# Patient Record
Sex: Female | Born: 1990
Health system: Southern US, Community
[De-identification: ages and names within clinical notes are randomized; demographics above are authoritative.]

## PROBLEM LIST (undated history)

## (undated) ENCOUNTER — Inpatient Hospital Stay (HOSPITAL_COMMUNITY): Payer: Self-pay

## (undated) DIAGNOSIS — D8989 Other specified disorders involving the immune mechanism, not elsewhere classified: Secondary | ICD-10-CM

## (undated) DIAGNOSIS — L509 Urticaria, unspecified: Secondary | ICD-10-CM

## (undated) DIAGNOSIS — T7840XA Allergy, unspecified, initial encounter: Secondary | ICD-10-CM

## (undated) DIAGNOSIS — R87629 Unspecified abnormal cytological findings in specimens from vagina: Secondary | ICD-10-CM

## (undated) DIAGNOSIS — R000 Tachycardia, unspecified: Secondary | ICD-10-CM

## (undated) DIAGNOSIS — F419 Anxiety disorder, unspecified: Secondary | ICD-10-CM

## (undated) DIAGNOSIS — K219 Gastro-esophageal reflux disease without esophagitis: Secondary | ICD-10-CM

## (undated) DIAGNOSIS — G43909 Migraine, unspecified, not intractable, without status migrainosus: Secondary | ICD-10-CM

## (undated) DIAGNOSIS — Z8489 Family history of other specified conditions: Secondary | ICD-10-CM

## (undated) DIAGNOSIS — T8859XA Other complications of anesthesia, initial encounter: Secondary | ICD-10-CM

## (undated) DIAGNOSIS — Z975 Presence of (intrauterine) contraceptive device: Secondary | ICD-10-CM

## (undated) DIAGNOSIS — T4145XA Adverse effect of unspecified anesthetic, initial encounter: Secondary | ICD-10-CM

## (undated) DIAGNOSIS — J45909 Unspecified asthma, uncomplicated: Secondary | ICD-10-CM

## (undated) DIAGNOSIS — E282 Polycystic ovarian syndrome: Secondary | ICD-10-CM

## (undated) DIAGNOSIS — O139 Gestational [pregnancy-induced] hypertension without significant proteinuria, unspecified trimester: Secondary | ICD-10-CM

## (undated) HISTORY — DX: Presence of (intrauterine) contraceptive device: Z97.5

## (undated) HISTORY — DX: Unspecified abnormal cytological findings in specimens from vagina: R87.629

## (undated) HISTORY — PX: ESOPHAGOGASTRODUODENOSCOPY: SHX1529

## (undated) HISTORY — DX: Other specified disorders involving the immune mechanism, not elsewhere classified: D89.89

## (undated) HISTORY — DX: Migraine, unspecified, not intractable, without status migrainosus: G43.909

## (undated) HISTORY — DX: Allergy, unspecified, initial encounter: T78.40XA

## (undated) HISTORY — DX: Urticaria, unspecified: L50.9

---
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
## 2008-01-20 ENCOUNTER — Inpatient Hospital Stay (HOSPITAL_COMMUNITY): Admission: AD | Admit: 2008-01-20 | Discharge: 2008-01-20 | Payer: Self-pay | Admitting: Obstetrics & Gynecology

## 2008-01-20 IMAGING — US US ART/VEN ABD/PELV/SCROTUM DOPPLER COMPLETE
1 series · 14 of 18 positions shown · non-contrast
Comparison: [DATE]

CLINICAL DATA: Right lower quadrant pain.  Evaluate ovaries for
torsion.

LIMITED TRANSABDOMINAL ULTRASOUND OF PELVIS WITH
DOPPLER ULTRASOUND OF OVARIES
TECHNIQUE: Transabdominal ultrasound examination of the pelvis was
performed including evaluation of the uterus, ovaries, adnexal
regions, and pelvic cul-de-sac. Color and duplex Doppler ultrasound
was utilized to evaluate blood flow to the ovaries.

[Series 1: us pelvis limited · 14 of 18 slices shown]
[im 1/18]
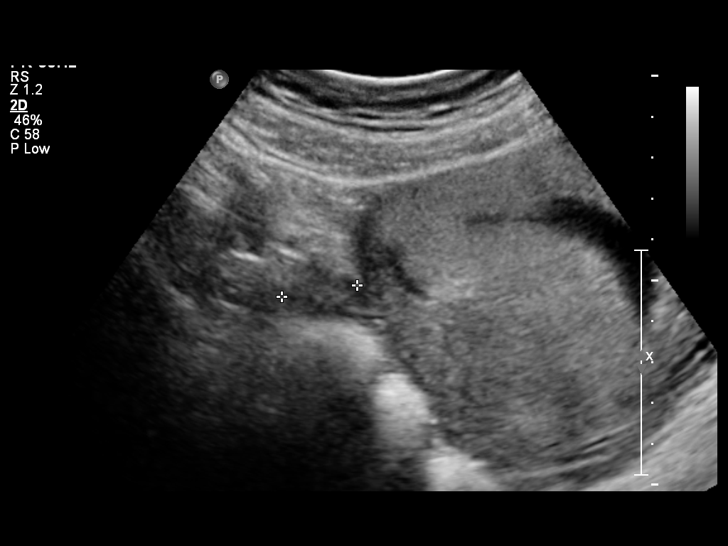
[im 2/18]
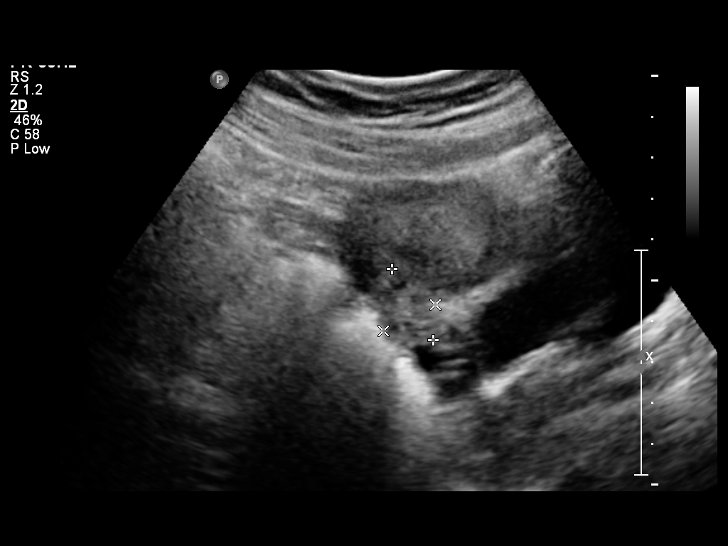
[im 4/18]
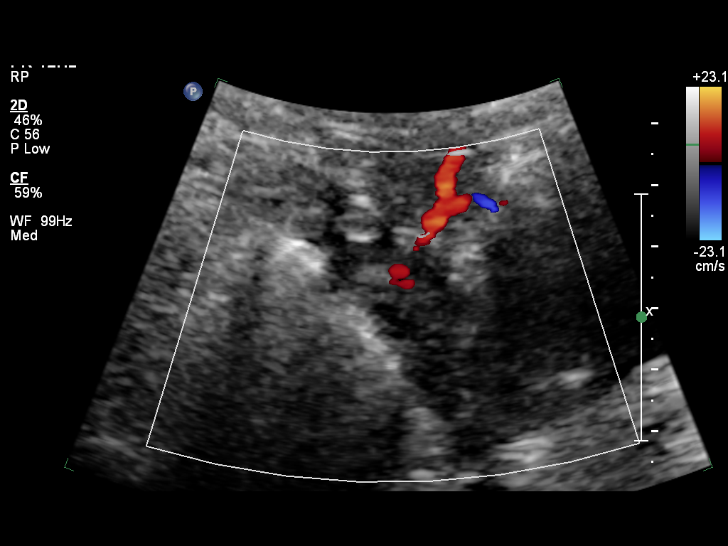
[im 5/18]
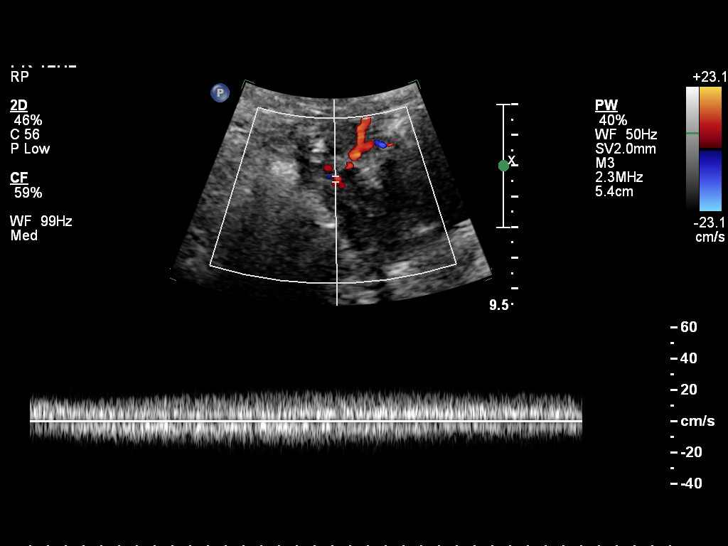
[im 6/18]
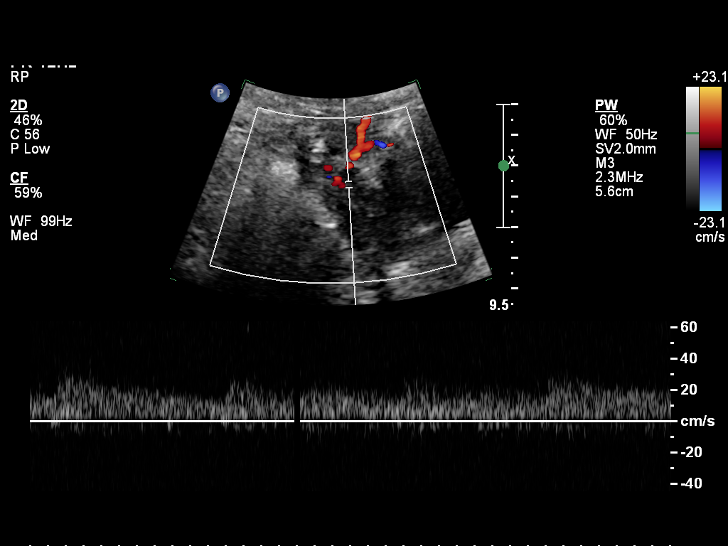
[im 8/18]
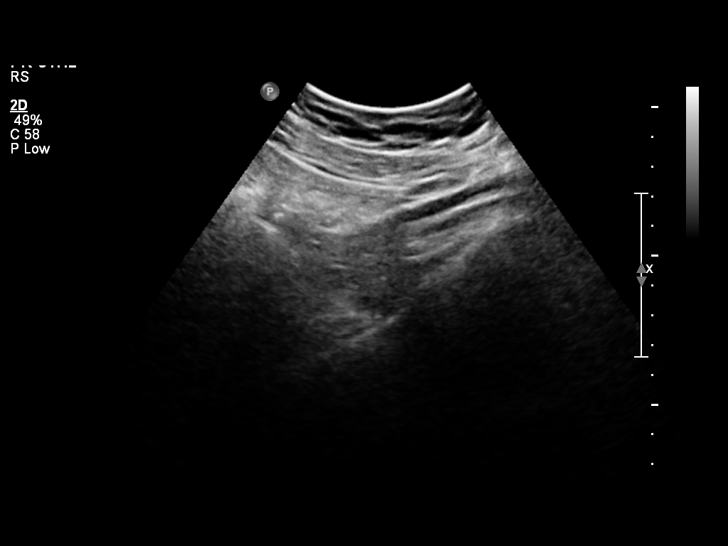
[im 9/18]
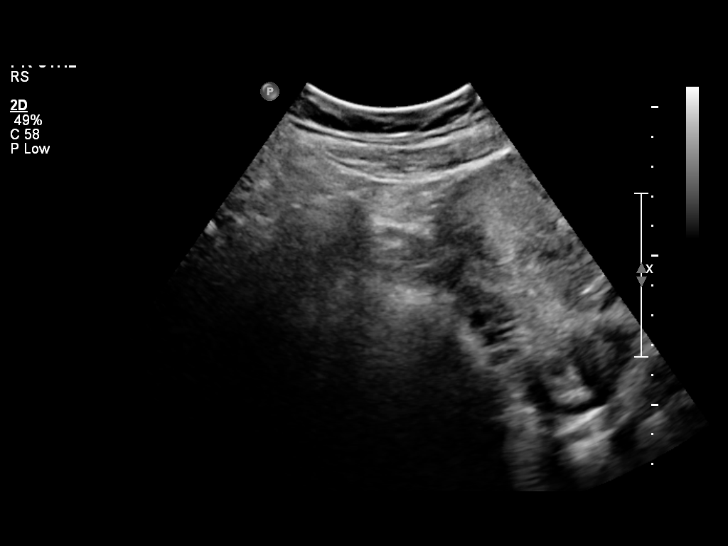
[im 10/18]
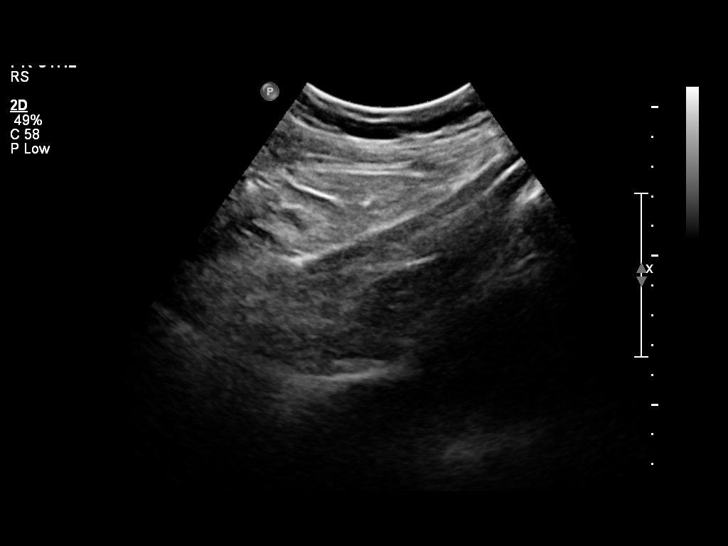
[im 11/18]
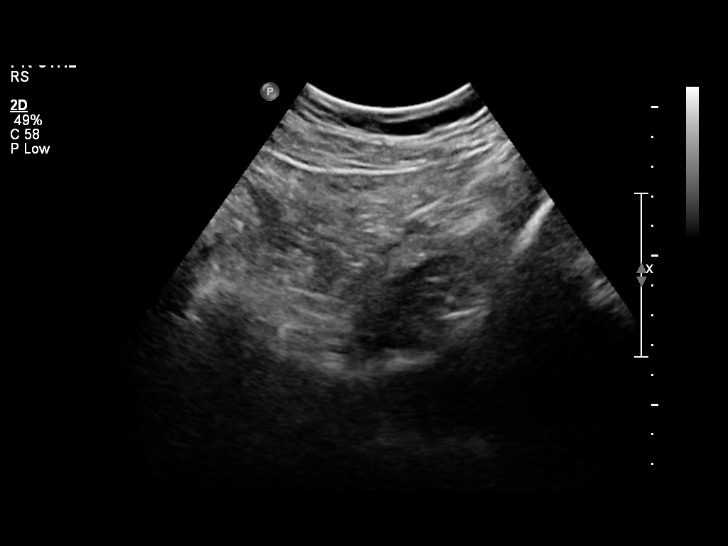
[im 13/18]
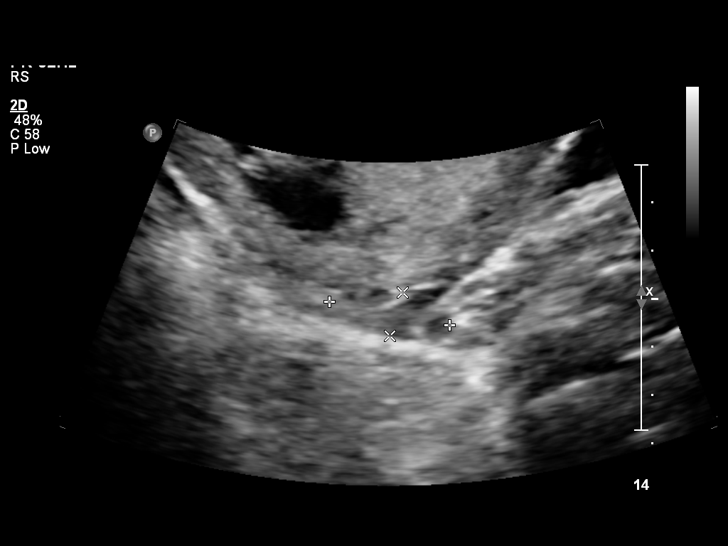
[im 14/18]
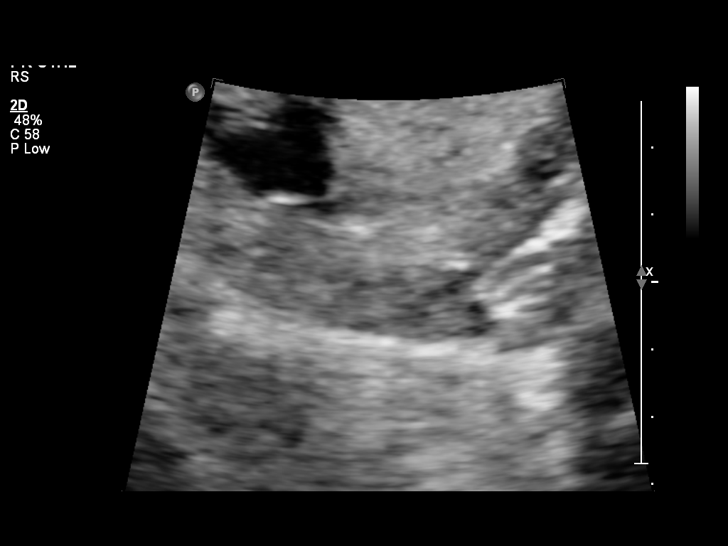
[im 15/18]
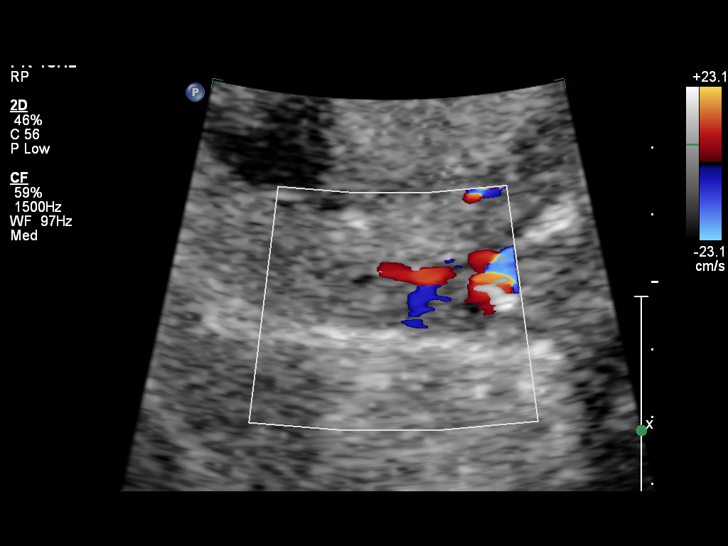
[im 17/18]
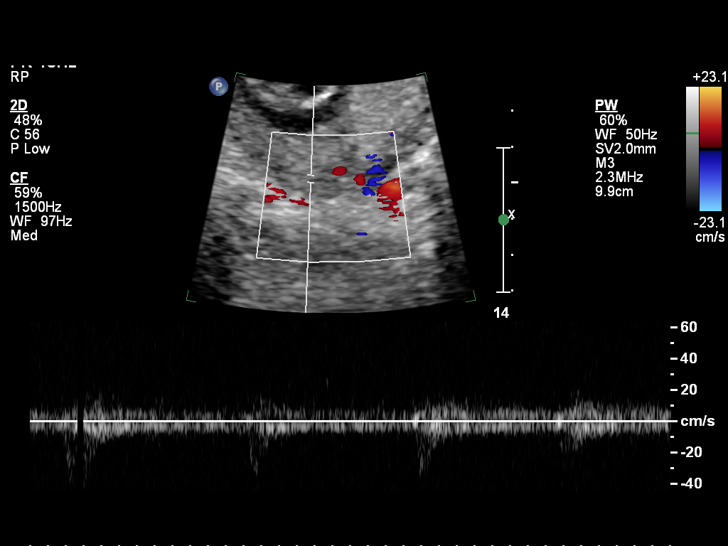
[im 18/18]
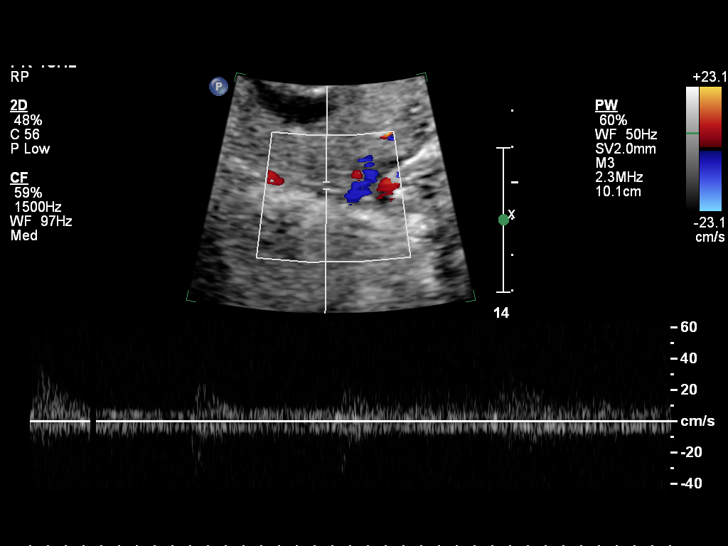

[14 of 18 positions shown; findings below may reference images not displayed]

FINDINGS: Evaluation of the gravid uterus was not requested.

The right ovary is 2.0 x 1.4 x 1.9 cm.  The left ovary is 2.5 x
x 2.1 cm.  Arterial and venous flow is identified within both
ovaries, excluding complete torsion at this time.  Ovaries have
normal appearance.  No free pelvic fluid is identified.
IMPRESSION: No evidence for ovarian torsion at this time.  Ovaries have normal
morphology bilaterally.

## 2008-01-24 ENCOUNTER — Other Ambulatory Visit: Admission: RE | Admit: 2008-01-24 | Discharge: 2008-01-24 | Payer: Self-pay | Admitting: Obstetrics & Gynecology

## 2008-03-01 ENCOUNTER — Inpatient Hospital Stay (HOSPITAL_COMMUNITY): Admission: AD | Admit: 2008-03-01 | Discharge: 2008-03-01 | Payer: Self-pay | Admitting: Obstetrics and Gynecology

## 2008-03-01 ENCOUNTER — Ambulatory Visit: Payer: Self-pay | Admitting: Advanced Practice Midwife

## 2008-04-25 HISTORY — PX: CHOLECYSTECTOMY: SHX55

## 2008-06-30 ENCOUNTER — Ambulatory Visit: Payer: Self-pay | Admitting: Obstetrics and Gynecology

## 2008-06-30 ENCOUNTER — Inpatient Hospital Stay (HOSPITAL_COMMUNITY): Admission: AD | Admit: 2008-06-30 | Discharge: 2008-06-30 | Payer: Self-pay | Admitting: Obstetrics & Gynecology

## 2008-07-19 ENCOUNTER — Inpatient Hospital Stay (HOSPITAL_COMMUNITY): Admission: AD | Admit: 2008-07-19 | Discharge: 2008-07-20 | Payer: Self-pay | Admitting: Family Medicine

## 2008-07-23 ENCOUNTER — Ambulatory Visit: Payer: Self-pay | Admitting: Physician Assistant

## 2008-07-23 ENCOUNTER — Inpatient Hospital Stay (HOSPITAL_COMMUNITY): Admission: AD | Admit: 2008-07-23 | Discharge: 2008-07-26 | Payer: Self-pay | Admitting: Obstetrics & Gynecology

## 2008-07-24 ENCOUNTER — Encounter: Payer: Self-pay | Admitting: Obstetrics & Gynecology

## 2008-08-14 ENCOUNTER — Emergency Department (HOSPITAL_COMMUNITY): Admission: EM | Admit: 2008-08-14 | Discharge: 2008-08-14 | Payer: Self-pay | Admitting: Emergency Medicine

## 2008-08-17 IMAGING — CR DG CHEST 2V
2 series · 2 of 2 positions shown · non-contrast
Comparison: [DATE]

CLINICAL DATA: Chest pain.  Left arm pain.

CHEST - 2 VIEW

[view not recorded (1 of 2)]
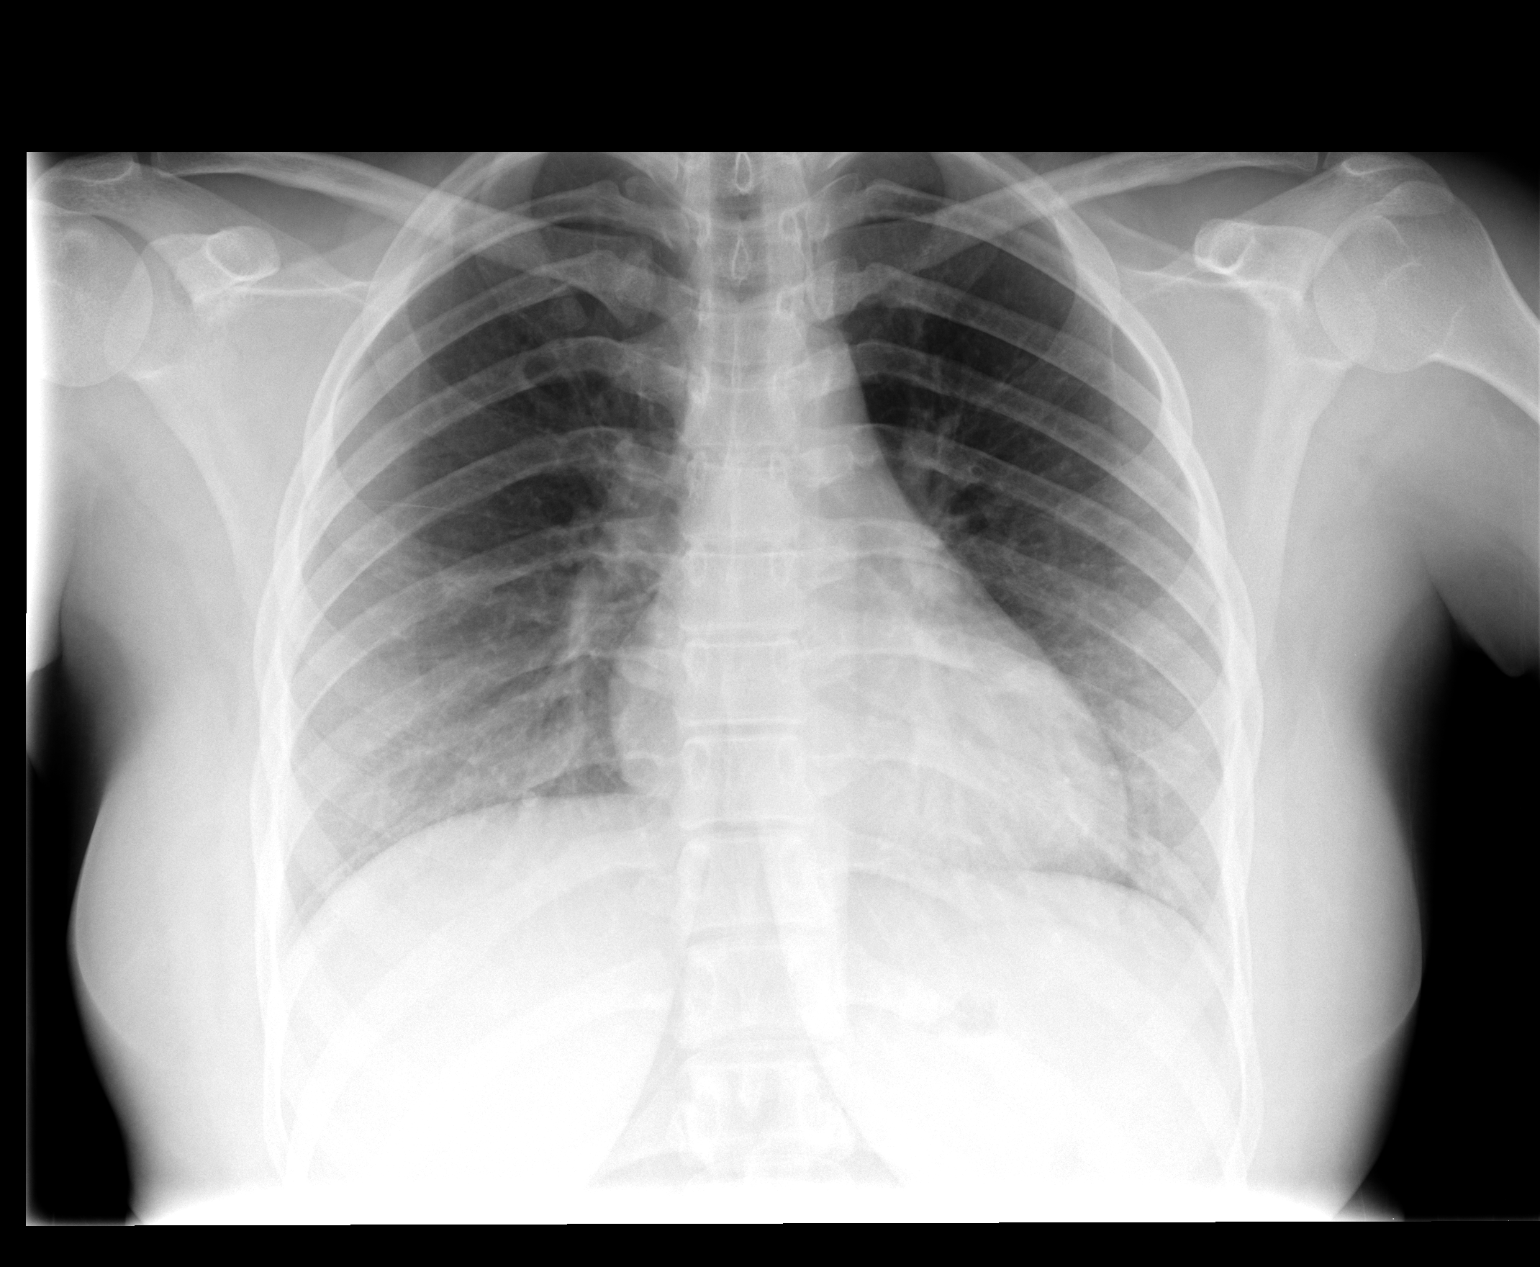

[view not recorded (2 of 2)]
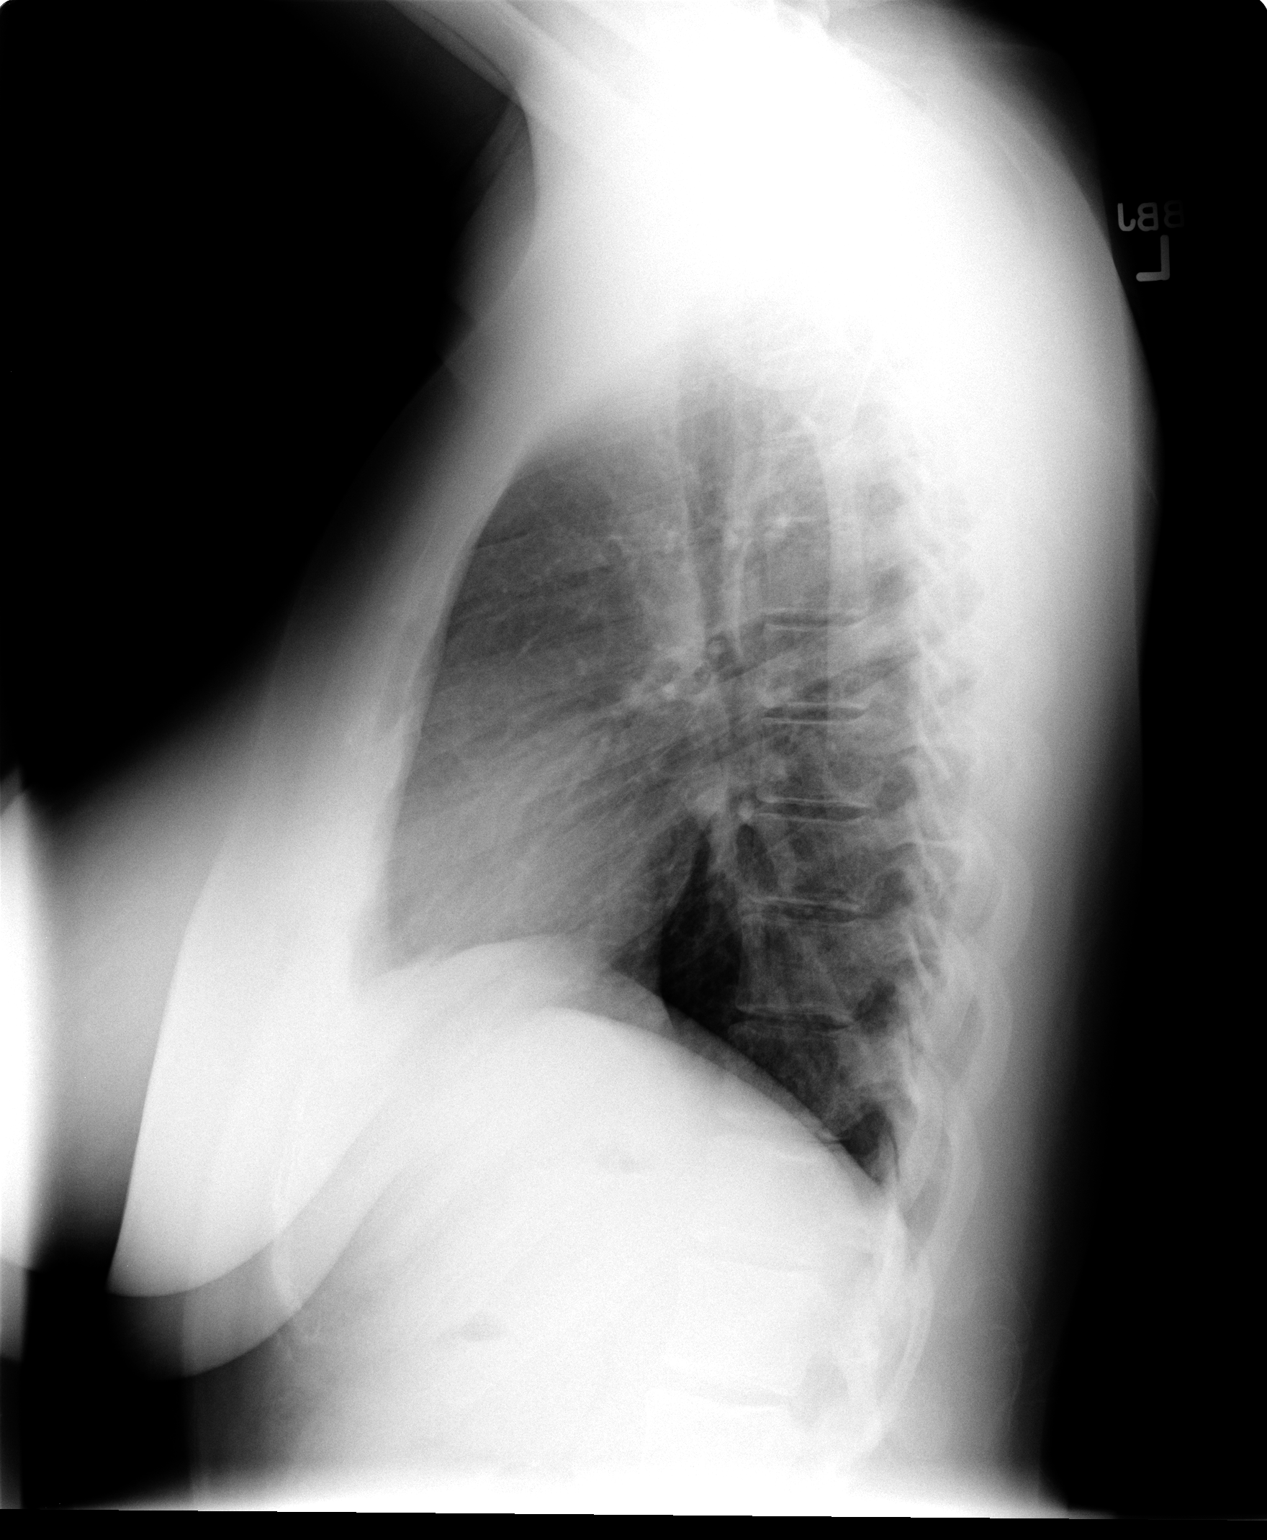

[2 of 2 positions shown; findings below may reference images not displayed]

FINDINGS: Cardiac and mediastinal contours appear normal.  The
lungs appear clear.  No pleural effusion noted.
IMPRESSION: 1.  No acute thoracic findings.

## 2008-08-20 ENCOUNTER — Ambulatory Visit (HOSPITAL_COMMUNITY): Admission: RE | Admit: 2008-08-20 | Discharge: 2008-08-20 | Payer: Self-pay | Admitting: Family Medicine

## 2008-08-20 IMAGING — US US ABDOMEN COMPLETE
1 series · 14 of 25 positions shown · non-contrast
Comparison: [DATE]

CLINICAL DATA: Epigastric pain, history gallstones

COMPLETE ABDOMINAL ULTRASOUND

[Series 1: us abdomen complete · 0.32mm/px · 14 of 75 slices shown]
[im 1/75]
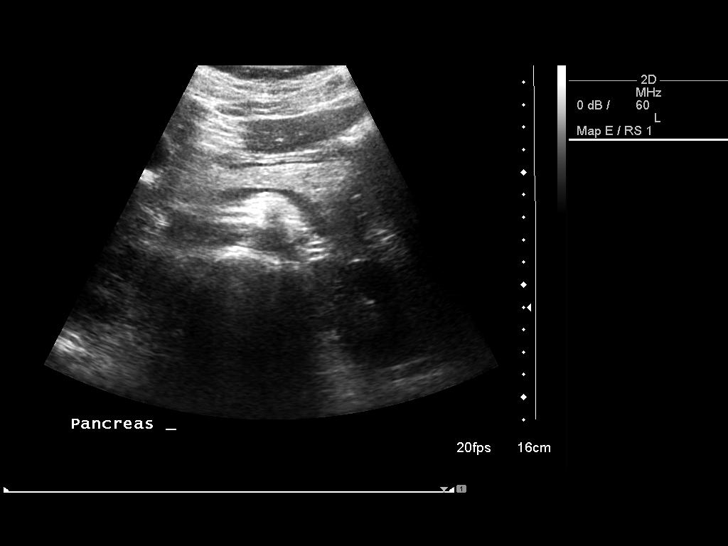
[im 7/75]
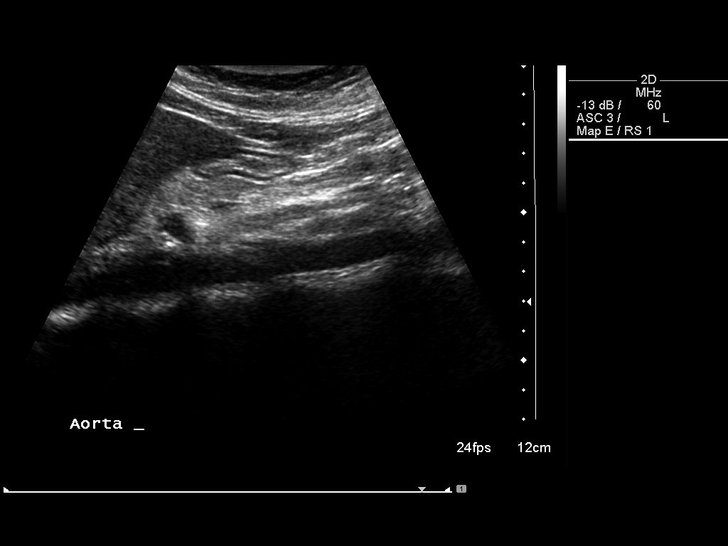
[im 13/75]
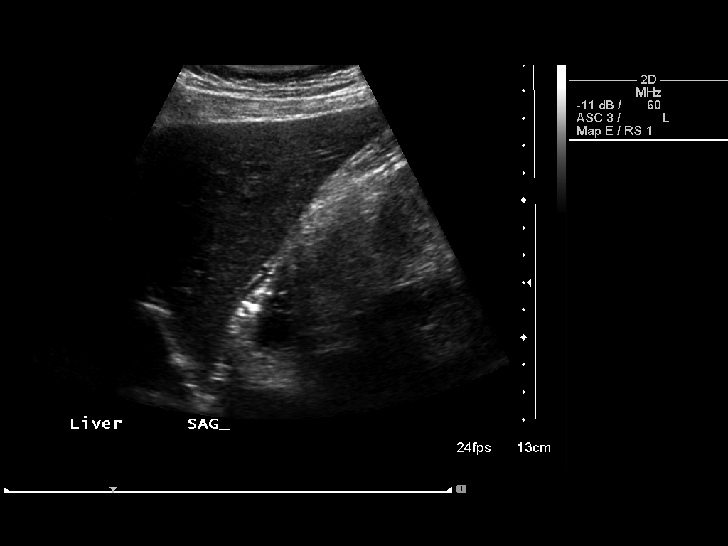
[im 19/75]
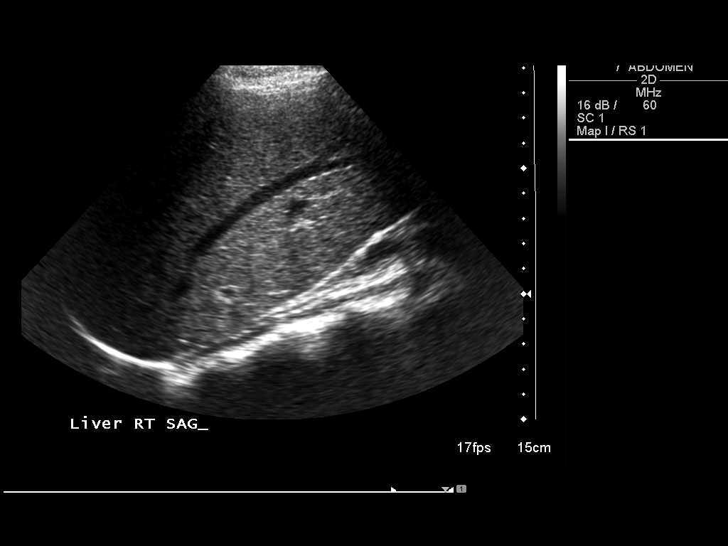
[im 25/75]
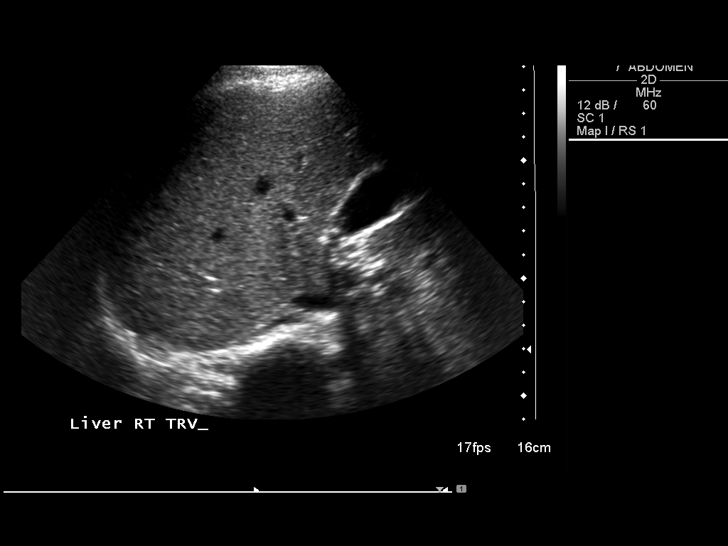
[im 28/75]
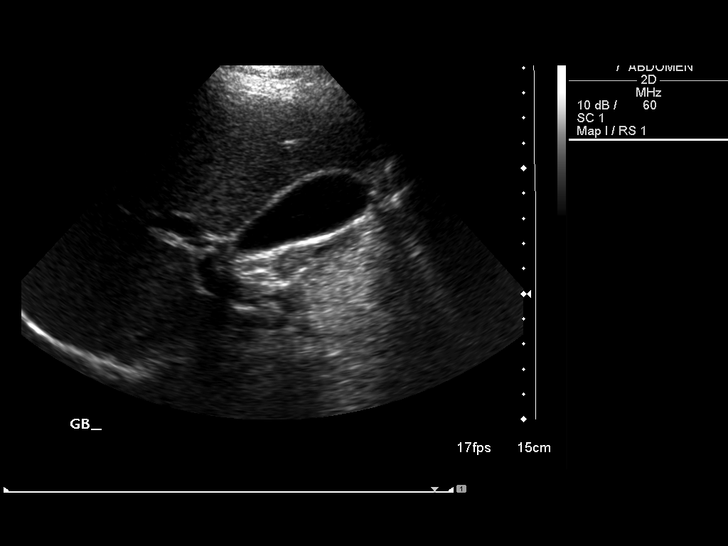
[im 34/75]
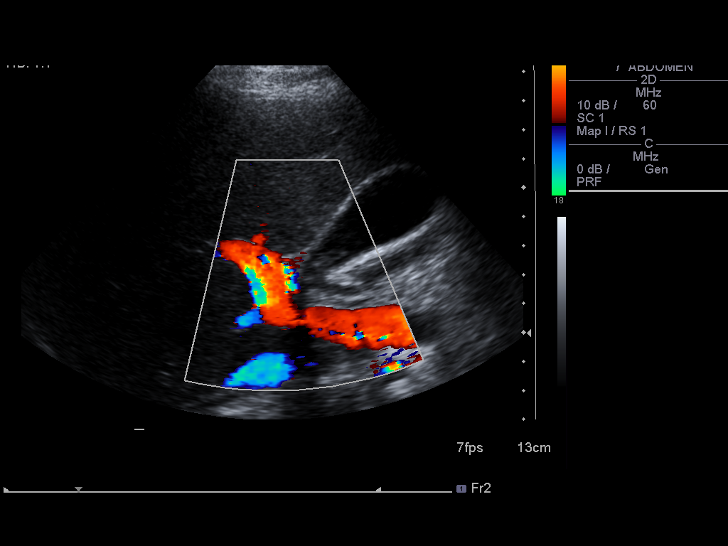
[im 41/75]
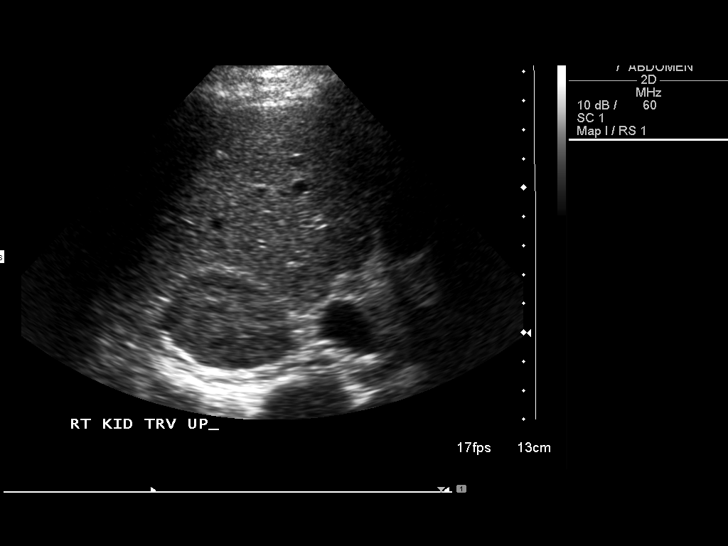
[im 47/75]
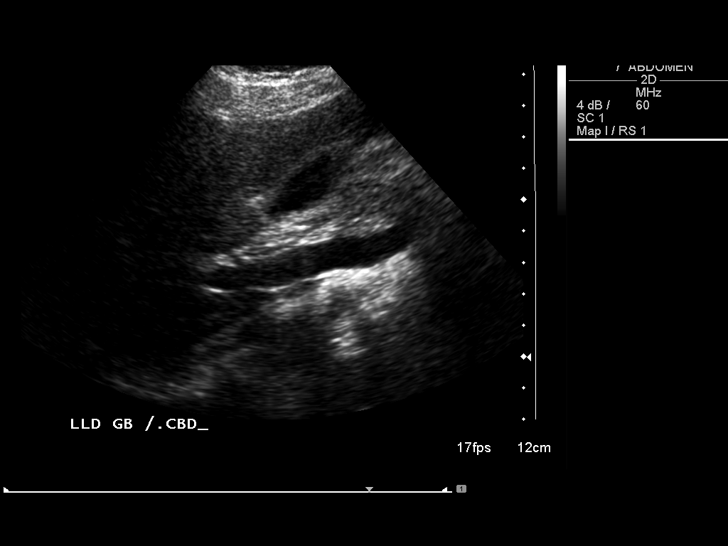
[im 50/75]
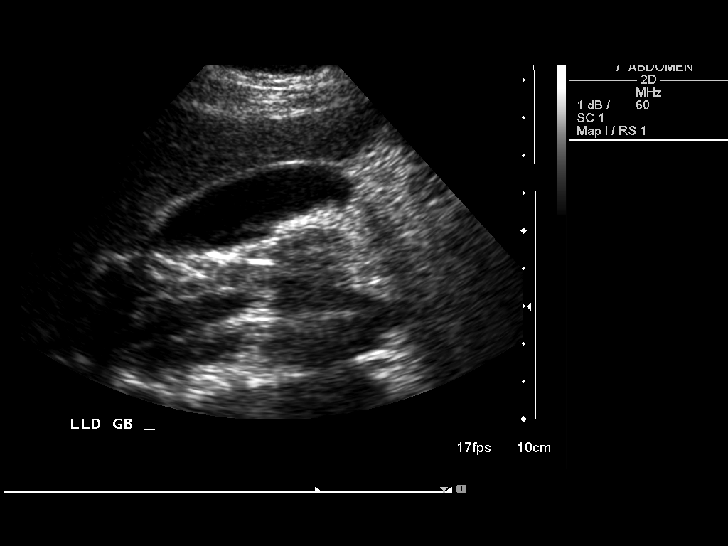
[im 56/75]
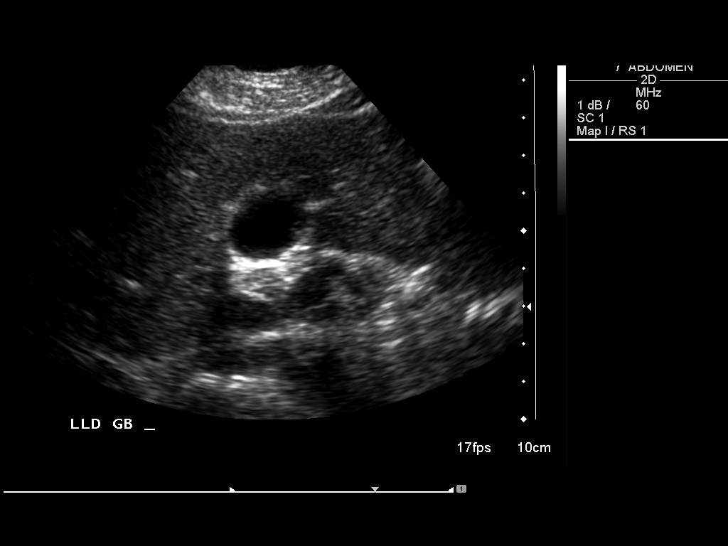
[im 62/75]
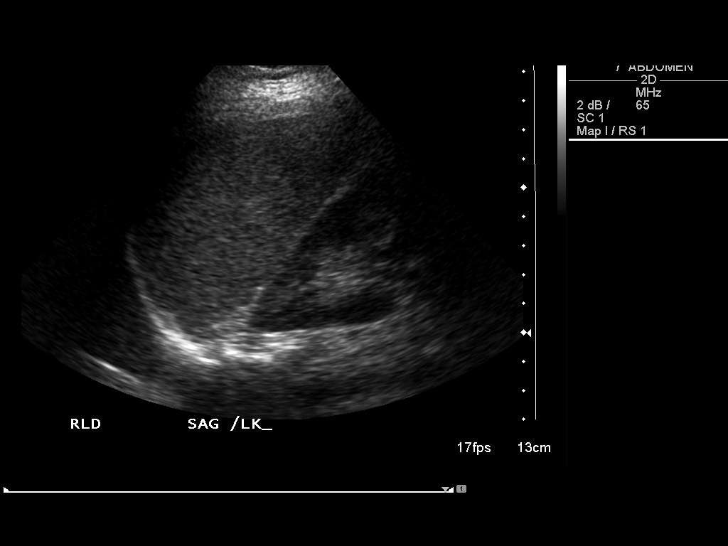
[im 68/75]
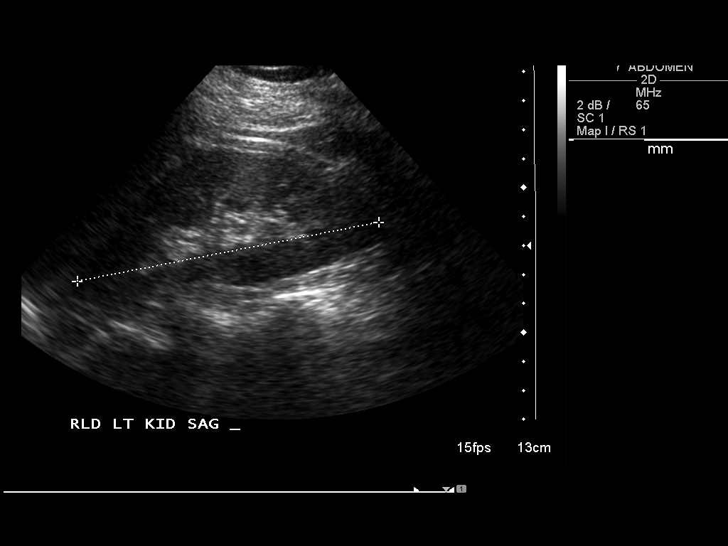
[im 75/75]
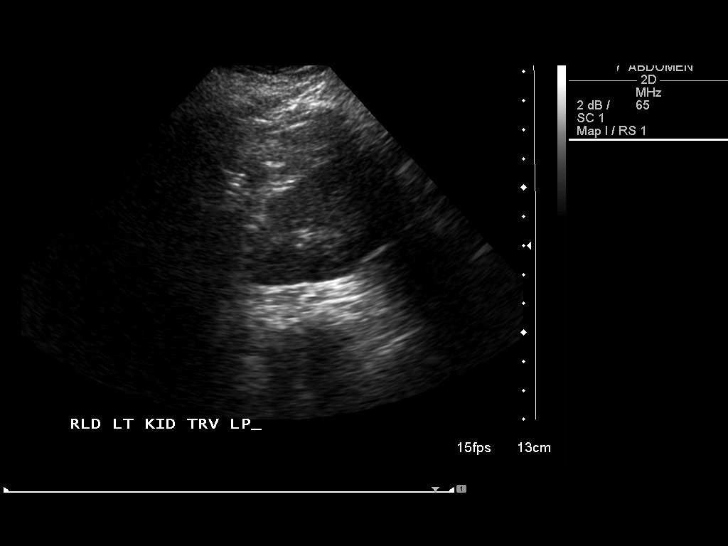

[14 of 25 positions shown; findings below may reference images not displayed]

FINDINGS: Gallbladder:  Tiny dependent shadowing calculi and small amount of
sludge within gallbladder lumen.  No gallbladder wall thickening,
pericholecystic fluid, or sonographic Murphy sign.

Common bile duct:  Normal caliber 2.1 mm diameter.

Liver:  Normal appearance

IVC:  Unremarkable

Pancreas:  Normal appearance

Spleen:  Normal appearance, 8.3 cm length

Right Kidney:  10.3 cm length.  Normal morphology without mass or
hydronephrosis.

Left Kidney:  10.3 cm length.  Normal morphology without mass or
hydronephrosis.

Abdominal aorta:  Unremarkable

Other Findings:  No free fluid
IMPRESSION: Tiny dependent calculi and sludge within gallbladder lumen.
No other upper abdominal abnormalities identified sonographically.

## 2008-09-01 ENCOUNTER — Encounter (HOSPITAL_COMMUNITY): Admission: RE | Admit: 2008-09-01 | Discharge: 2008-10-01 | Payer: Self-pay | Admitting: Family Medicine

## 2008-09-01 IMAGING — NM NM HEPATO W/GB/PHARM/[PERSON_NAME]
2 series · 12 of 12 positions shown · non-contrast
Comparison: None

CLINICAL DATA: Abdominal pain, gallstones

NUCLEAR MEDICINE HEPATOBILIARY IMAGING WITH GALLBLADDER EF:
TECHNIQUE: Sequential images of the abdomen were obtained for 60
minutes following intravenous administration of
radiopharmaceutical.  Patient then received an infusion of
ugm/kg of CCK analog intravenously over 30 minutes, and imaging was
continued for 30 minutes.  A time-activity curve was generated from
tracer within the gallbladder following CCK administration, and the
gallbladder ejection fraction was calculated.
Radiopharmaceutical:  5.1 millicuries [7K] mebrofenin
Pharmaceutical:  1.6 mcg CCK

[Series 1: hida · 3.20mm/px · 6 of 60 frames shown (1 of 2)]
[frame 6/60]
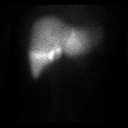
[frame 16/60]
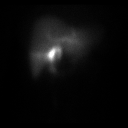
[frame 26/60]
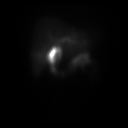
[frame 36/60]
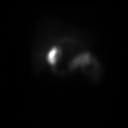
[frame 46/60]
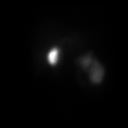
[frame 56/60]
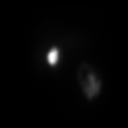

[Series 1: hida · 3.20mm/px · 6 of 30 frames shown (2 of 2)]
[frame 3/30]
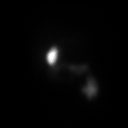
[frame 8/30]
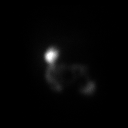
[frame 13/30]
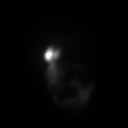
[frame 18/30]
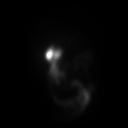
[frame 23/30]
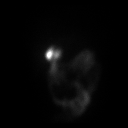
[frame 28/30]
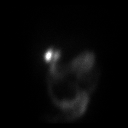

[12 of 12 positions shown; findings below may reference images not displayed]

FINDINGS: Prompt tracer extraction from bloodstream, indicating normal
hepatocellular function.
Prompt excretion of tracer into biliary tree.
Gallbladder visualized at 8 minutes.
Small bowel visualized at 19 minutes.
No hepatic retention of tracer.

Subjectively normal emptying of tracer occurs from gallbladder
following CCK simulation.
Calculated gallbladder ejection fraction is 48%, normal.
Patient experienced cramping following CCK administration.
IMPRESSION: Normal exam as above.

## 2008-10-03 ENCOUNTER — Encounter (INDEPENDENT_AMBULATORY_CARE_PROVIDER_SITE_OTHER): Payer: Self-pay | Admitting: General Surgery

## 2008-10-03 ENCOUNTER — Ambulatory Visit (HOSPITAL_COMMUNITY): Admission: RE | Admit: 2008-10-03 | Discharge: 2008-10-03 | Payer: Self-pay | Admitting: General Surgery

## 2009-04-01 ENCOUNTER — Encounter (HOSPITAL_COMMUNITY): Admission: RE | Admit: 2009-04-01 | Discharge: 2009-04-22 | Payer: Self-pay | Admitting: Family Medicine

## 2009-04-13 ENCOUNTER — Ambulatory Visit (HOSPITAL_COMMUNITY): Admission: RE | Admit: 2009-04-13 | Discharge: 2009-04-13 | Payer: Self-pay | Admitting: Family Medicine

## 2010-05-17 ENCOUNTER — Encounter: Payer: Self-pay | Admitting: Family Medicine

## 2010-05-17 ENCOUNTER — Encounter: Payer: Self-pay | Admitting: Obstetrics & Gynecology

## 2010-08-02 LAB — CBC
Platelets: 312 10*3/uL (ref 150–400)
RDW: 13.7 % (ref 11.5–15.5)
WBC: 7.5 10*3/uL (ref 4.0–10.5)

## 2010-08-02 LAB — BASIC METABOLIC PANEL
BUN: 8 mg/dL (ref 6–23)
Calcium: 9.6 mg/dL (ref 8.4–10.5)
GFR calc non Af Amer: >60 mL/min (ref >60)
Glucose, Bld: 97 mg/dL (ref 70–99)
Potassium: 4.2 mEq/L (ref 3.5–5.1)
Sodium: 136 mEq/L (ref 135–145)

## 2010-08-04 LAB — COMPREHENSIVE METABOLIC PANEL
ALT: 52 U/L — ABNORMAL HIGH (ref 0–35)
AST: 61 U/L — ABNORMAL HIGH (ref 0–37)
Alkaline Phosphatase: 123 U/L — ABNORMAL HIGH (ref 39–117)
CO2: 25 mEq/L (ref 19–32)
Chloride: 104 mEq/L (ref 96–112)
GFR calc Af Amer: >60 mL/min (ref >60)
GFR calc non Af Amer: >60 mL/min (ref >60)
Glucose, Bld: 103 mg/dL — ABNORMAL HIGH (ref 70–99)
Potassium: 3.5 mEq/L (ref 3.5–5.1)
Sodium: 138 mEq/L (ref 135–145)
Total Bilirubin: 0.4 mg/dL (ref 0.3–1.2)

## 2010-08-04 LAB — DIFFERENTIAL
Basophils Relative: 1 % (ref 0–1)
Eosinophils Absolute: 0.2 10*3/uL (ref 0.0–0.7)
Eosinophils Relative: 2 % (ref 0–5)
Lymphs Abs: 2.5 10*3/uL (ref 0.7–4.0)

## 2010-08-04 LAB — CBC
Hemoglobin: 12 g/dL (ref 12.0–15.0)
Hemoglobin: 12.7 g/dL (ref 12.0–15.0)
MCHC: 34.3 g/dL (ref 30.0–36.0)
MCV: 88.2 fL (ref 78.0–100.0)
RBC: 3.99 MIL/uL (ref 3.87–5.11)
RBC: 4.21 MIL/uL (ref 3.87–5.11)
WBC: 9 10*3/uL (ref 4.0–10.5)

## 2010-08-04 LAB — URINALYSIS, ROUTINE W REFLEX MICROSCOPIC
Bilirubin Urine: NEGATIVE
Hgb urine dipstick: NEGATIVE
Ketones, ur: NEGATIVE mg/dL
Specific Gravity, Urine: 1.025 (ref 1.005–1.030)
Urobilinogen, UA: 0.2 mg/dL (ref 0.0–1.0)

## 2010-08-04 LAB — LIPASE, BLOOD: Lipase: 31 U/L (ref 11–59)

## 2010-08-05 LAB — CBC
MCHC: 33.9 g/dL (ref 30.0–36.0)
MCV: 88.2 fL (ref 78.0–100.0)
Platelets: 343 10*3/uL (ref 150–400)
Platelets: 357 10*3/uL (ref 150–400)
WBC: 14.4 10*3/uL — ABNORMAL HIGH (ref 4.0–10.5)
WBC: 14.4 10*3/uL — ABNORMAL HIGH (ref 4.0–10.5)

## 2010-08-05 LAB — COMPREHENSIVE METABOLIC PANEL
AST: 27 U/L (ref 0–37)
Albumin: 2.8 g/dL — ABNORMAL LOW (ref 3.5–5.2)
Alkaline Phosphatase: 259 U/L — ABNORMAL HIGH (ref 39–117)
Chloride: 105 mEq/L (ref 96–112)
GFR calc Af Amer: >60 mL/min (ref >60)
Potassium: 3.6 mEq/L (ref 3.5–5.1)
Sodium: 137 mEq/L (ref 135–145)
Total Bilirubin: 0.2 mg/dL — ABNORMAL LOW (ref 0.3–1.2)

## 2010-08-05 LAB — URINALYSIS, DIPSTICK ONLY
Protein, ur: 100 mg/dL — AB
Urobilinogen, UA: 0.2 mg/dL (ref 0.0–1.0)

## 2010-08-05 LAB — RPR: RPR Ser Ql: NONREACTIVE

## 2010-09-07 NOTE — Op Note (Signed)
NAME:  Dana Mcpherson, Dana Mcpherson                ACCOUNT NO.:  [id number]   MEDICAL RECORD NO.:  [id number]          PATIENT TYPE:  AMB   LOCATION:  DAY                           FACILITY:  [facility]   PHYSICIAN:  Tilford Pillar, MD      DATE OF BIRTH:  04-Aug-1990   DATE OF PROCEDURE:  10/03/2008  DATE OF DISCHARGE:  10/03/2008                               OPERATIVE REPORT   PREOPERATIVE DIAGNOSIS:  Cholelithiasis.   POSTOPERATIVE DIAGNOSIS:  Cholelithiasis.   PROCEDURE:  Laparoscopic cholecystectomy.   SURGEON:  Tilford Pillar, MD   ANESTHESIA:  General endotracheal, local anesthetic 0.5% Marcaine plain.   SPECIMEN:  Gallbladder.   ESTIMATED BLOOD LOSS:  Minimal.   INDICATIONS:  The patient is a 20 year old female recently postpartum,  who presented to my office with a history of epigastric and right upper  quadrant abdominal pain.  The patient has had extensive workup, was  actually pulled back when she was 14 that she had cholelithiasis, but  has never proceeded with additional workup or intervention.  During her  pregnancy, she did have exacerbation of her symptomatology.  Following  the pregnancy, she did have a hiatus seen.  Again with the  administration of the cholecystokinin, had exacerbation of her  symptomatology consistent with the symptoms she has been having.  Risks,  benefits, and alternatives of a laparoscopic possible open  cholecystectomy were discussed at length with the patient including, but  not limited to the risk of bleeding, infection, bile leak, small bowel  injury, common bile duct injury as well as the possibility of  intraoperative cardiac and pulmonary events.  The patient's questions  and concerns were addressed and the patient was consented for a planned  procedure.   OPERATION:  The patient was taken to the operating room and was placed  in the supine position on the operating room table, at which time the  general anesthetic was administered.  Once the patient  was asleep, she  was endotracheally intubated by anesthesia.  At this point, her abdomen  was prepped with cholecystokinin solution due to the patient's history  of an IODINE allergy and draped in standard fashion.  A stab incision  was created infraumbilically with an 11-blade scalpel.  Additional  dissection down through the subcuticular tissues carried out using a  Kocher clamp, which was utilized to grasp the anterior abdominal wall  fascia and moved this anteriorly.  A Veress needle was inserted.  Saline  drop test was utilized to confirm intraperitoneal placement and a  pneumoperitoneum was initiated.  Once sufficient pneumoperitoneum was  obtained, a 11-mm trocar was inserted over the laparoscope allowing  visualization of trocar entering into the peritoneal cavity.  At this  time, the inner cannula was removed.  The laparoscope was reinserted.  There is no evidence of any trocar or Veress needle placement injury.  Remaining trocars were then placed with the 11-trocar in the  epigastrium, a 5-mm trocar in the midline, between the two 11-mm trocars  and a 5-mm trocar in the right lateral abdominal wall.  These were all  placed  under direct visualization with the laparoscope.  The patient was  then placed into a reverse Trendelenburg left lateral decubitus  position.  The fundus of the gallbladder was identified, was grasped and  lifted up and over the right lobe of the liver, blunt peritoneal  dissection was carried out exposing the infundibulum and the cystic duct  was entered into the infundibulum, was carried out using a Recruitment consultant. A window was created behind the cystic duct and at this time,  3 Endoclips were placed proximally, 1 distally, and the cystic duct was  divided between the 2 most distal clips.  The cystic duct was then  transected with EndoShears and similar dissection was carried out once  the cystic artery was identified.  Two Endoclips were placed  proximally,  one distally and cystic artery was divided.  At this time,  electrocautery was utilized to dissect the gallbladder free from the  gallbladder fossa.  During this dissection, I did identify the posterior  branch of the cystic artery.  This was controlled using two EndoClips to  control posterior branch of the cystic artery which was clearly seen  entering into the posterior wall of the gallbladder.  I continued the  dissection till the gallbladder was freed.  Once it was freed, it was  placed into an Endocatch bag and which was placed up and over the right  lobe of the liver.  Inspection of the gallbladder fossa demonstrated  good hemostasis.  Endoclips were inspected.  There was no evidence of  any bleeding or bile leak.  At this time, I turned my attention to  closure.   Using a Endoclose suture passing device, a 2-0 Vicryl suture was passed  through both the 11-trocar sites.  With these sutures in place, the  gallbladder was grasped in the Endocatch bag and was removed through the  umbilical trocar site.  Gallbladder was removed in an intact Endocatch  bag and it was placed in the back table, was sent as a permanent  specimen to Pathology.  At this time, the pneumoperitoneum was  evacuated.  The Vicryl sutures were secured.  The local anesthetic was  instilled, and a 4-0 Monocryl was utilized to reapproximate the skin  edges at all 4 trocar sites.  The skin was washed and dried with a moist  and dry towel.  Steri-Strips were placed on the incision and then the  drapes were removed.  The patient was allowed to come out of general  aesthetic and was transferred back to regular hospital bed, then she was  transferred to the postanesthetic care unit in stable condition.  At the  conclusion of the procedure, all instrument, sponge and needle counts  were correct.  The patient tolerated the procedure extremely well.      Tilford Pillar, MD  Electronically Signed      BZ/MEDQ  D:  10/04/2008  T:  10/04/2008  Job:  [id number]   cc:   Dr. Gerda Diss

## 2010-09-07 NOTE — Discharge Summary (Signed)
NAME:  Dana Mcpherson, Dana Mcpherson                ACCOUNT NO.:  [id number]   MEDICAL RECORD NO.:  [id number]          PATIENT TYPE:  INP   LOCATION:  9128                          FACILITY:  [facility]   PHYSICIAN:  Tilda Burrow, M.D. DATE OF BIRTH:  1990/08/23   DATE OF ADMISSION:  07/23/2008  DATE OF DISCHARGE:  07/26/2008                               DISCHARGE SUMMARY   ADMITTING DIAGNOSIS:  Pregnancy 40 weeks 4 days, now preeclampsia.   HISTORY OF PRESENT ILLNESS:  This is 20 year old gravida 1, para 0, LMP  October 13, 2007, [facility] July 19, 2008, with initial ultrasound suggesting  [facility] of July 24, 2008, essentially identical.  She was admitted after  concerns of blood pressure developed.  She was seen from the prenatal  clinic for blood pressures of diastolic in the 100 with 4+ proteinuria.  Denies headaches, visual disturbances, or epigastric pain.  Laboratory  evaluation at the time of admission showing normal liver function test,  AST 27, ALT 24, BUN 4, creatinine 0.47, with LDH 128 all normal.  Platelets 357,000.  DTRs are 3+.  She was admitted for mild preeclampsia  for Foley induction of labor.  Foley induction was inserted at 7:30 p.m.  and fetal heart rate showed reactive pattern with marked variability  when admitted on the evening of July 23, 2008.   HOSPITAL COURSE:  The patient was admitted, responded nicely with Foley  bulb and then was placed on Pitocin.  Amniotomy was performed at 7 a.m.,  magnesium sulfate therapy begun.  Epidural used for analgesia.  She  progressed steadily and delivered at mid day on July 24, 2008,  delivering a healthy female infant, Apgars 8 and 9, delivered by Maylon Cos nurse midwife.  There was a dark mole lesion on the right lower  perineum removed and results are pending at the time of this dictation.  The patient's blood pressure postpartum was essentially benign with 110-  120 systolic over 70-80 diastolic with 2+ edema, 3+ reflex without  clonus.   Discharge planning was arranged on July 26, 2008, when the patient was  found to be breastfeeding with bottle supplementation, desiring oral  contraceptive, so therefore, she will be placed on Micronor x6 months  and then conversion to regular oral contraceptives upon completion of  the breastfeeding experience.   ADDENDUM  Followup of skin lesion needed at postpartum visit.  RESULTS OF VULVAR BIOPSY PENDING AT THE TIME OF THIS DICTATION.      Tilda Burrow, M.D.  Electronically Signed     JVF/MEDQ  D:  07/26/2008  T:  07/27/2008  Job:  [id number]   cc:   [facility]   Donna Bernard, M.D.  Fax: [phone]

## 2010-09-07 NOTE — H&P (Signed)
NAME:  Dana Mcpherson, Dana Mcpherson                ACCOUNT NO.:  [id number]   MEDICAL RECORD NO.:  [id number]          PATIENT TYPE:  AMB   LOCATION:  DAY                           FACILITY:  [facility]   PHYSICIAN:  Tilford Pillar, MD      DATE OF BIRTH:  04-11-1991   DATE OF ADMISSION:  DATE OF DISCHARGE:  [facility]                              HISTORY & PHYSICAL   CHIEF COMPLAINT:  Bilateral subcostal pain.   HISTORY OF PRESENT ILLNESS:  The patient is a 20 year old female  recently postpartum who presents with a history of increasing bilateral  subcostal abdominal pain.  She did have several episodes during her  pregnancy.  She states that this does occasionally come with the pain  but mostly occurs at night.  Pain does radiate to the back and is  described as colicky in nature.  She has not noticed any particular  increase in fatty or greasy foods, but state some history of diarrhea.  No melena and no hematochezia.  She denies any jaundice.  She has had  some subjective fevers.  She has no history of blood emesis or nausea.   PAST MEDICAL HISTORY:  Anxiety and depression.   PAST SURGICAL HISTORY:  None.   MEDICATIONS:  YAZ, Lexapro, hydrocodone and iron supplementation.   ALLERGIES:  NO KNOWN DRUG ALLERGIES.   SOCIAL HISTORY:  She is a less than one pack per day smoker.  Alcohol  none.  Recreational drugs none.  She has had one previous pregnancy.  She has a recent newborn.   FAMILY HISTORY:  She does have significant family history of biliary  disease with multiple family members undergoing cholecystectomy.   REVIEW OF SYSTEMS:  CONSTITUTIONAL:  Unremarkable.  EYES:  Unremarkable.  ENT:  RESPIRATORY:  CARDIOVASCULAR:  All unremarkable.  GASTROINTESTINAL:  As per HPI, otherwise unremarkable.  GENITOURINARY,  MUSCULOSKELETAL, SKIN, ENDOCRINE, NEUROLOGICAL:  Are all unremarkable.   PHYSICAL EXAM:  GENERAL:  The patient is a moderately obese, slightly  anxious female.  She is not in any acute  distress.  She is alert and  oriented x3.  HEENT: Scalp no deformities, no masses.  Eyes: Pupils equal, round,  reactive.  Extraocular movements are intact.  No scleral icterus or  conjunctival pallor is noted.  Oral is pink.  Normal occlusion.  NECK:  Trachea is midline.  No cervical lymphadenopathy.  No thyroid  nodularity is noted.  PULMONARY:  Unlabored respiration.  No wheezes.  She is clear to  auscultation bilaterally.  No crackles are apparent.  CARDIOVASCULAR:  Regular rate and rhythm.  No murmurs or gallops are  apparent.  She has 2+ radial, femoral and dorsalis pedis pulses  bilaterally.  ABDOMEN:  Positive bowel sounds.  Abdomen is soft.  She has mild right  upper quadrant abdominal tenderness.  No true Eulah Pont sign is elicited.  No hernias or masses are apparent.  Skin is warm and dry.   LABORATORY RADIOGRAPHIC STUDIES:  Right upper quadrant ultrasound did  demonstrate stones.  There is no ductal dilatation.  No pericholecystic  fluid.  No evidence of  any gallbladder wall thickening.  She did have a  previous HIDA scan which demonstrated an ejection fracture of only 48%,  however, her symptomatology is exacerbated with the IV injection of CCK  which is consistent with her presenting symptomatology.   ASSESSMENT/PLAN:  Cholelithiasis.  At this time I did discuss with the  patient the risks, benefits, alternatives laparoscopic possible open  cholecystectomy and based on her symptomatology and the increase of her  symptoms with the injection of CCK, I did discuss the majority of these  symptoms should improve with removal of her gallbladder.  We did discuss  however, the other etiologies of right upper quadrant pain, including  esophageal, stomach, duodenal, pancreatic and hepatic etiologies.  She  does understand these are other potential sources however, given her  course of no improvement with antacid medications I do feel that  majority of symptoms are coming from her  gallbladder.  Risks, benefits  and alternatives of laparoscopic possible open cholecystectomy were  discussed including but not limited to bleeding, infection, bile leak,  small-bowel injury, common bile duct injury, as well as possibility  intraoperative cardiac and pulmonary events.  At this time we will plan  to proceed at the patient's earliest convenience and we will schedule  this at her convenience.      Tilford Pillar, MD  Electronically Signed     BZ/MEDQ  D:  09/30/2008  T:  10/01/2008  Job:  [id number]   cc:   Dr. Almedia Balls surgery

## 2010-10-18 ENCOUNTER — Emergency Department (HOSPITAL_COMMUNITY): Payer: Medicaid Other

## 2010-10-18 ENCOUNTER — Emergency Department (HOSPITAL_COMMUNITY)
Admission: EM | Admit: 2010-10-18 | Discharge: 2010-10-18 | Disposition: A | Discharge status: Home / Self Care | Payer: Medicaid Other | Attending: Emergency Medicine | Admitting: Emergency Medicine

## 2010-10-18 DIAGNOSIS — R0602 Shortness of breath: Secondary | ICD-10-CM | POA: Insufficient documentation

## 2010-10-18 DIAGNOSIS — R079 Chest pain, unspecified: Secondary | ICD-10-CM | POA: Insufficient documentation

## 2010-10-18 DIAGNOSIS — R0609 Other forms of dyspnea: Secondary | ICD-10-CM | POA: Insufficient documentation

## 2010-10-18 DIAGNOSIS — R0989 Other specified symptoms and signs involving the circulatory and respiratory systems: Secondary | ICD-10-CM | POA: Insufficient documentation

## 2010-10-18 DIAGNOSIS — Z79899 Other long term (current) drug therapy: Secondary | ICD-10-CM | POA: Insufficient documentation

## 2010-10-18 DIAGNOSIS — R42 Dizziness and giddiness: Secondary | ICD-10-CM | POA: Insufficient documentation

## 2010-10-18 DIAGNOSIS — K219 Gastro-esophageal reflux disease without esophagitis: Secondary | ICD-10-CM | POA: Insufficient documentation

## 2010-10-18 LAB — COMPREHENSIVE METABOLIC PANEL
ALT: 15 U/L (ref 0–35)
AST: 16 U/L (ref 0–37)
Alkaline Phosphatase: 94 U/L (ref 39–117)
CO2: 23 mEq/L (ref 19–32)
Chloride: 102 mEq/L (ref 96–112)
Creatinine, Ser: 0.54 mg/dL (ref 0.50–1.10)
GFR calc non Af Amer: >60 mL/min (ref >60)
Potassium: 3.9 mEq/L (ref 3.5–5.1)
Sodium: 136 mEq/L (ref 135–145)
Total Bilirubin: 0.3 mg/dL (ref 0.3–1.2)

## 2010-10-18 LAB — CBC
MCH: 29.2 pg (ref 26.0–34.0)
MCV: 81.7 fL (ref 78.0–100.0)
Platelets: 266 10*3/uL (ref 150–400)
RBC: 4.59 MIL/uL (ref 3.87–5.11)

## 2010-10-18 LAB — DIFFERENTIAL
Eosinophils Absolute: 0.2 10*3/uL (ref 0.0–0.7)
Eosinophils Relative: 2 % (ref 0–5)
Lymphs Abs: 1.9 10*3/uL (ref 0.7–4.0)
Monocytes Relative: 7 % (ref 3–12)
Neutrophils Relative %: 73 % (ref 43–77)

## 2010-10-18 LAB — TROPONIN I: Troponin I: <0.3 ng/mL (ref <0.30)

## 2010-10-18 IMAGING — CR DG CHEST 2V
2 series · 2 of 2 positions shown · non-contrast
Comparison: [DATE]

CLINICAL DATA: Chest pain

CHEST - 2 VIEW

[w chest pa]
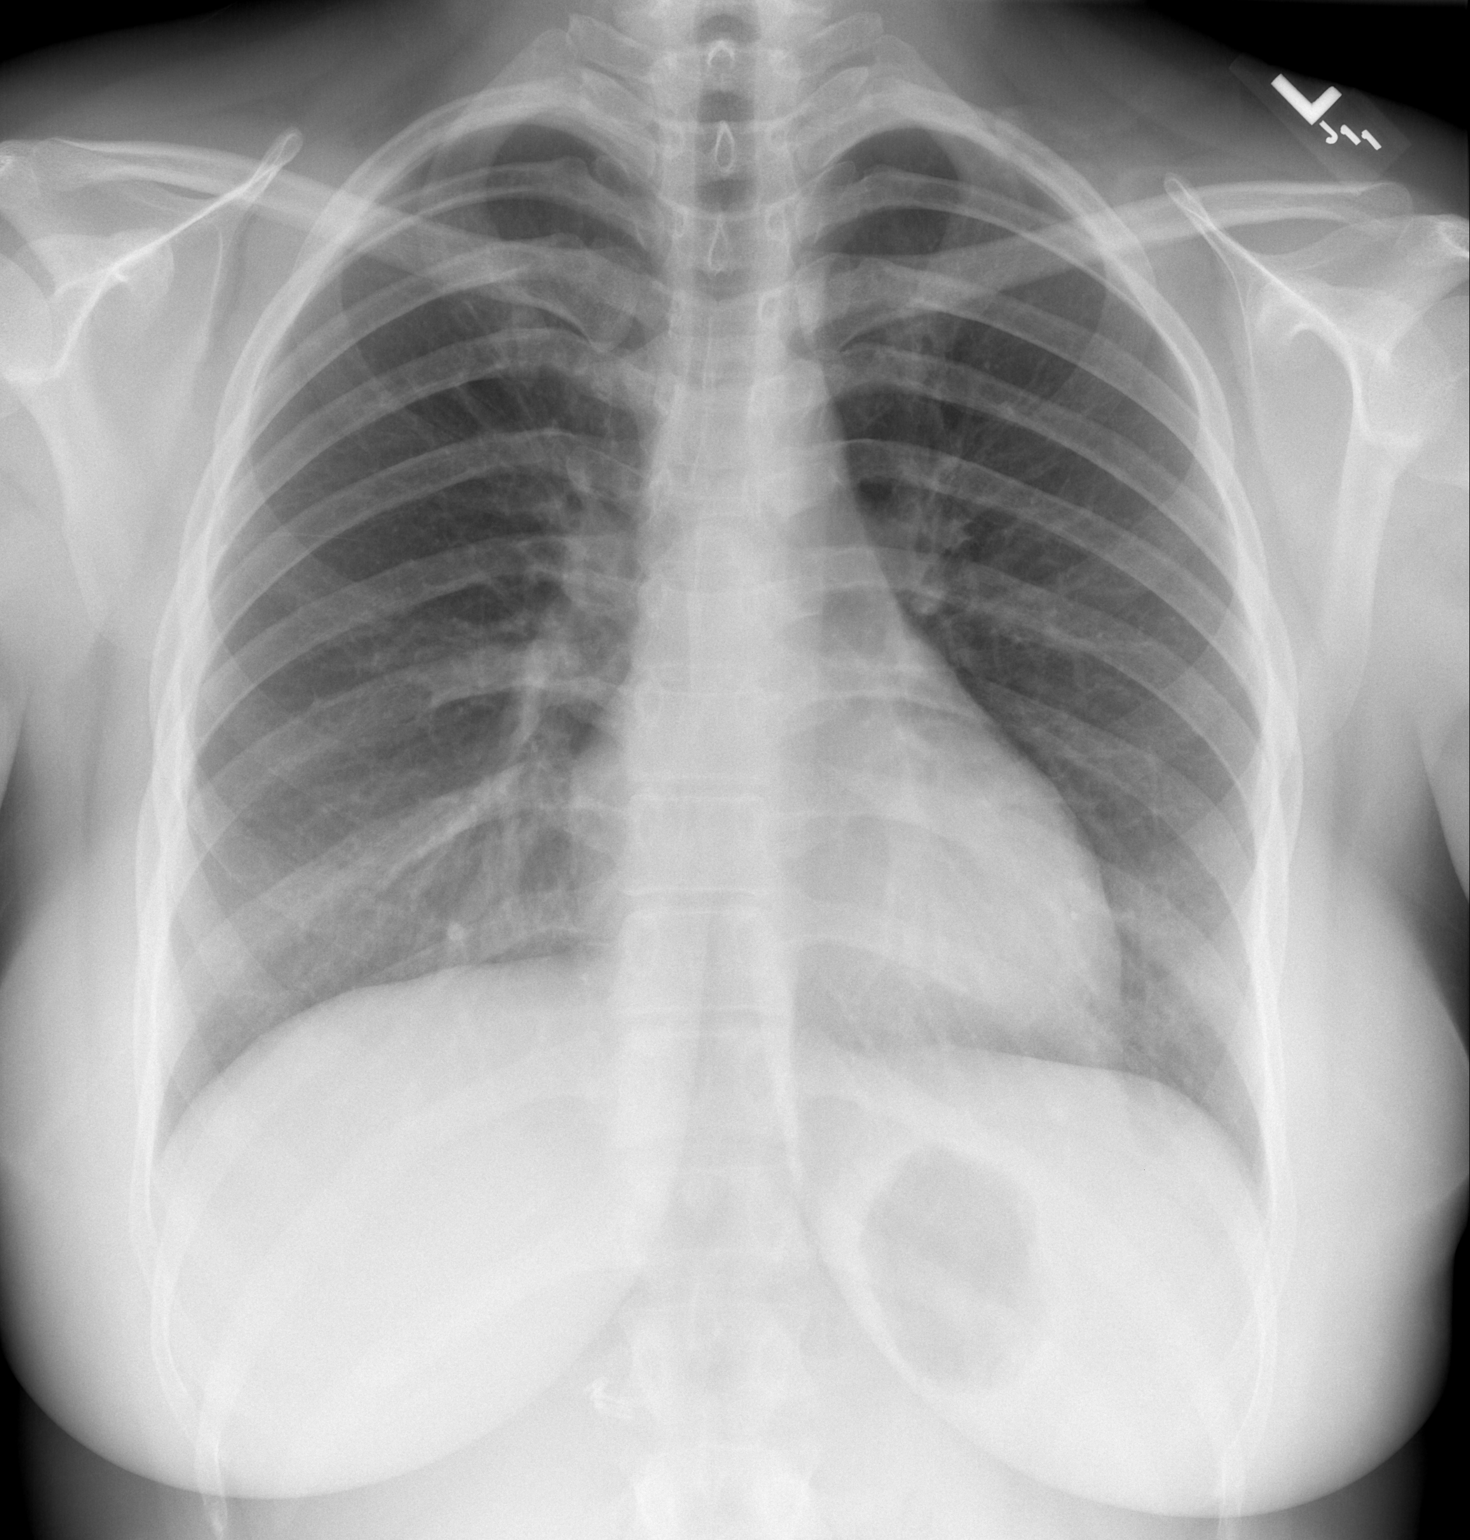

[w chest lat]
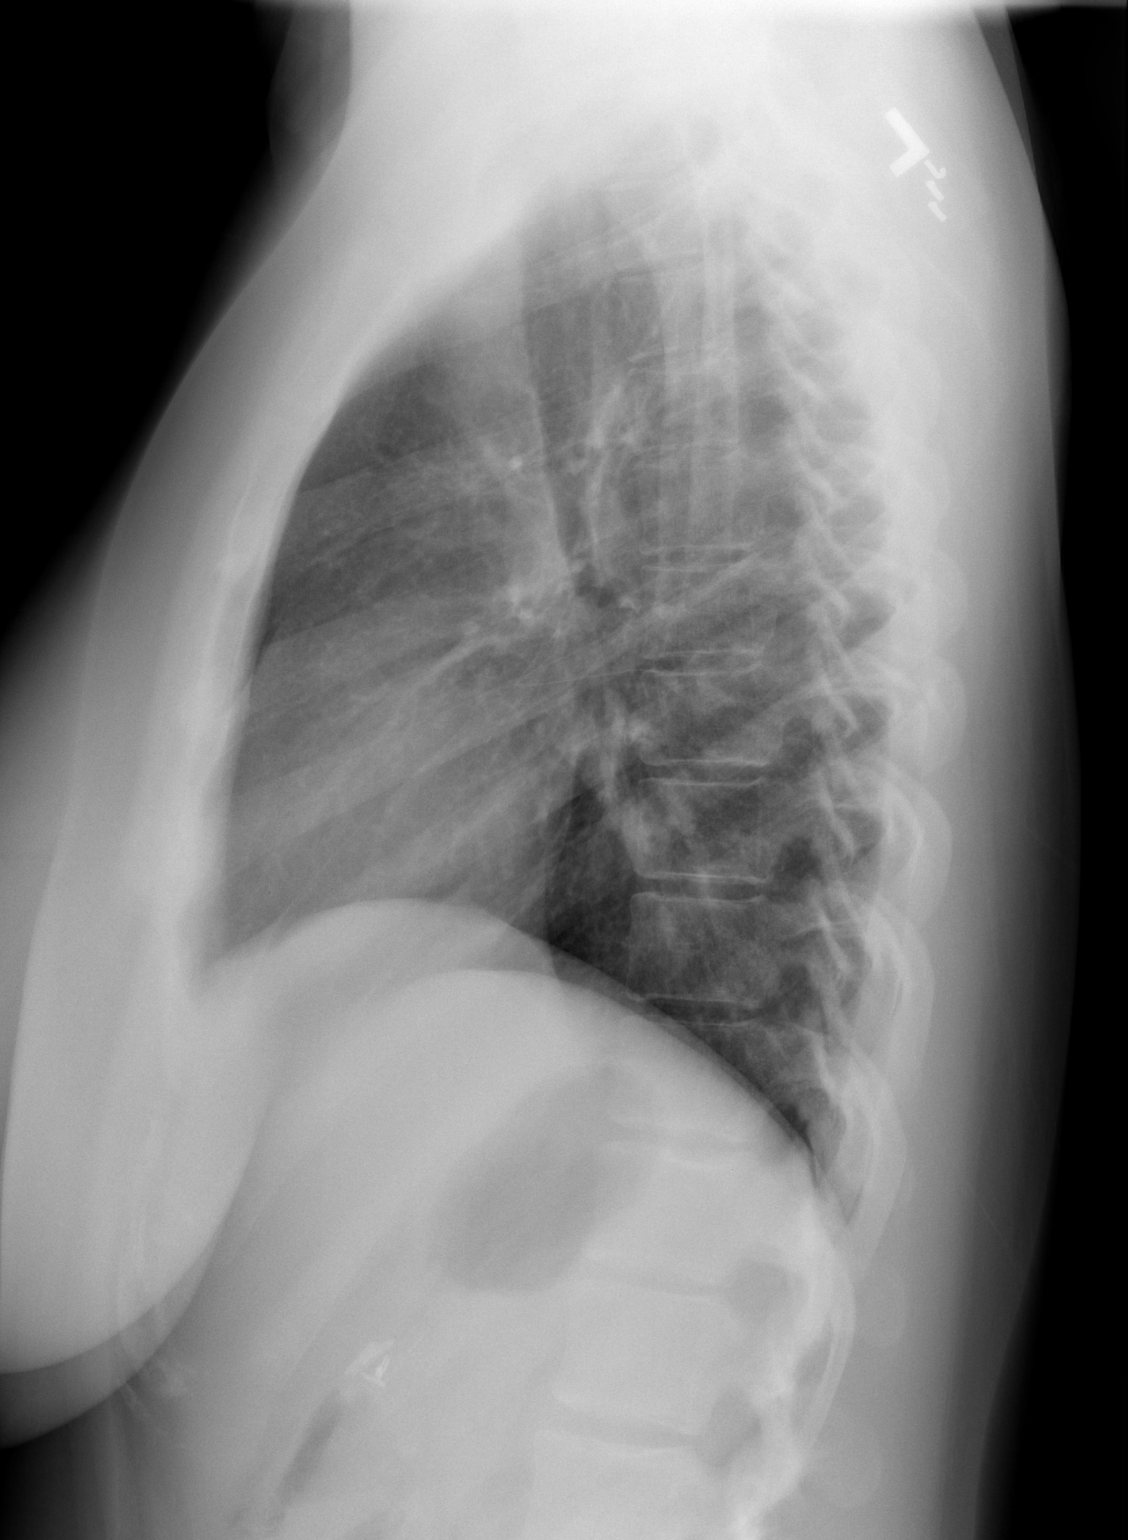

[2 of 2 positions shown; findings below may reference images not displayed]

FINDINGS: Heart and mediastinal contours normal.  Lungs clear.  No
pleural fluid.  Osseous structures and soft tissues unremarkable.
IMPRESSION: No acute or significant findings.

## 2011-01-18 LAB — PREGNANCY, URINE
Preg Test, Ur: NEGATIVE
Preg Test, Ur: NEGATIVE

## 2011-01-18 LAB — URINALYSIS, ROUTINE W REFLEX MICROSCOPIC
Nitrite: POSITIVE — AB
Specific Gravity, Urine: >1.03 — ABNORMAL HIGH
Urobilinogen, UA: 2 — ABNORMAL HIGH

## 2011-01-18 LAB — URINE MICROSCOPIC-ADD ON

## 2011-01-19 LAB — URINALYSIS, ROUTINE W REFLEX MICROSCOPIC
Glucose, UA: NEGATIVE
Ketones, ur: NEGATIVE
Protein, ur: NEGATIVE
Urobilinogen, UA: 0.2

## 2011-01-19 LAB — PREGNANCY, URINE: Preg Test, Ur: NEGATIVE

## 2011-01-24 LAB — CBC
HCT: 37.3
Hemoglobin: 13.1
MCHC: 35.2
RDW: 13

## 2011-01-24 LAB — URINALYSIS, ROUTINE W REFLEX MICROSCOPIC
Bilirubin Urine: NEGATIVE
Ketones, ur: NEGATIVE
Nitrite: NEGATIVE
Protein, ur: NEGATIVE
pH: 7.5

## 2011-01-24 LAB — DIFFERENTIAL
Basophils Absolute: 0
Basophils Relative: 0
Eosinophils Relative: 1
Monocytes Absolute: 0.5
Neutro Abs: 4.6

## 2011-01-25 LAB — URINALYSIS, ROUTINE W REFLEX MICROSCOPIC
Glucose, UA: NEGATIVE
Leukocytes, UA: NEGATIVE
Specific Gravity, Urine: 1.02
pH: 6.5

## 2011-01-25 LAB — URINE MICROSCOPIC-ADD ON

## 2012-07-05 ENCOUNTER — Emergency Department (HOSPITAL_COMMUNITY)
Admission: EM | Admit: 2012-07-05 | Discharge: 2012-07-05 | Disposition: A | Discharge status: Home / Self Care | Payer: Self-pay | Attending: Emergency Medicine | Admitting: Emergency Medicine

## 2012-07-05 ENCOUNTER — Emergency Department (HOSPITAL_COMMUNITY): Payer: Self-pay

## 2012-07-05 ENCOUNTER — Encounter (HOSPITAL_COMMUNITY): Payer: Self-pay | Admitting: *Deleted

## 2012-07-05 DIAGNOSIS — IMO0002 Reserved for concepts with insufficient information to code with codable children: Secondary | ICD-10-CM | POA: Insufficient documentation

## 2012-07-05 DIAGNOSIS — S8990XA Unspecified injury of unspecified lower leg, initial encounter: Secondary | ICD-10-CM | POA: Insufficient documentation

## 2012-07-05 DIAGNOSIS — S99919A Unspecified injury of unspecified ankle, initial encounter: Secondary | ICD-10-CM | POA: Insufficient documentation

## 2012-07-05 DIAGNOSIS — S5000XA Contusion of unspecified elbow, initial encounter: Secondary | ICD-10-CM | POA: Insufficient documentation

## 2012-07-05 DIAGNOSIS — Y929 Unspecified place or not applicable: Secondary | ICD-10-CM | POA: Insufficient documentation

## 2012-07-05 DIAGNOSIS — Y939 Activity, unspecified: Secondary | ICD-10-CM | POA: Insufficient documentation

## 2012-07-05 DIAGNOSIS — S5001XA Contusion of right elbow, initial encounter: Secondary | ICD-10-CM

## 2012-07-05 DIAGNOSIS — Z79899 Other long term (current) drug therapy: Secondary | ICD-10-CM | POA: Insufficient documentation

## 2012-07-05 IMAGING — CR DG ELBOW COMPLETE 3+V*R*
2 series · 2 of 2 positions shown · non-contrast
Comparison: None.

CLINICAL DATA: Right elbow pain after a fall.

RIGHT ELBOW - COMPLETE 3+ VIEW

[view not recorded (1 of 2)]
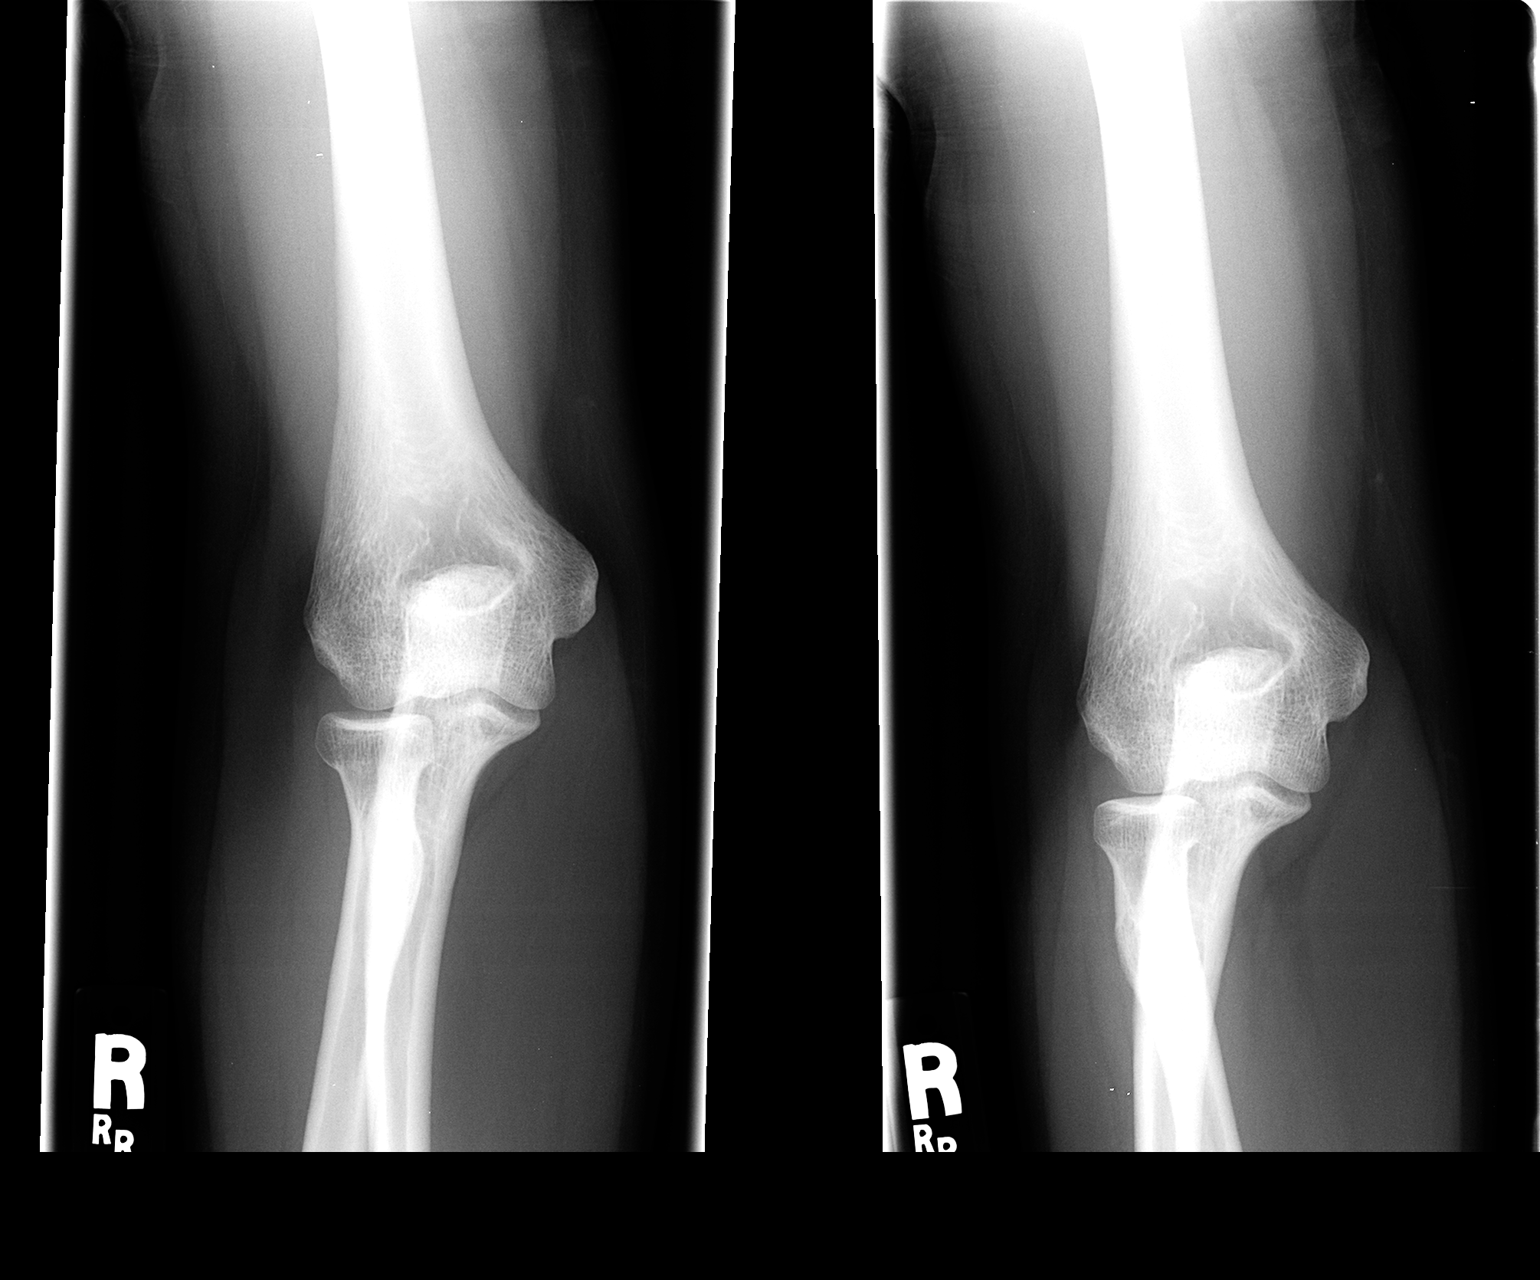

[view not recorded (2 of 2)]
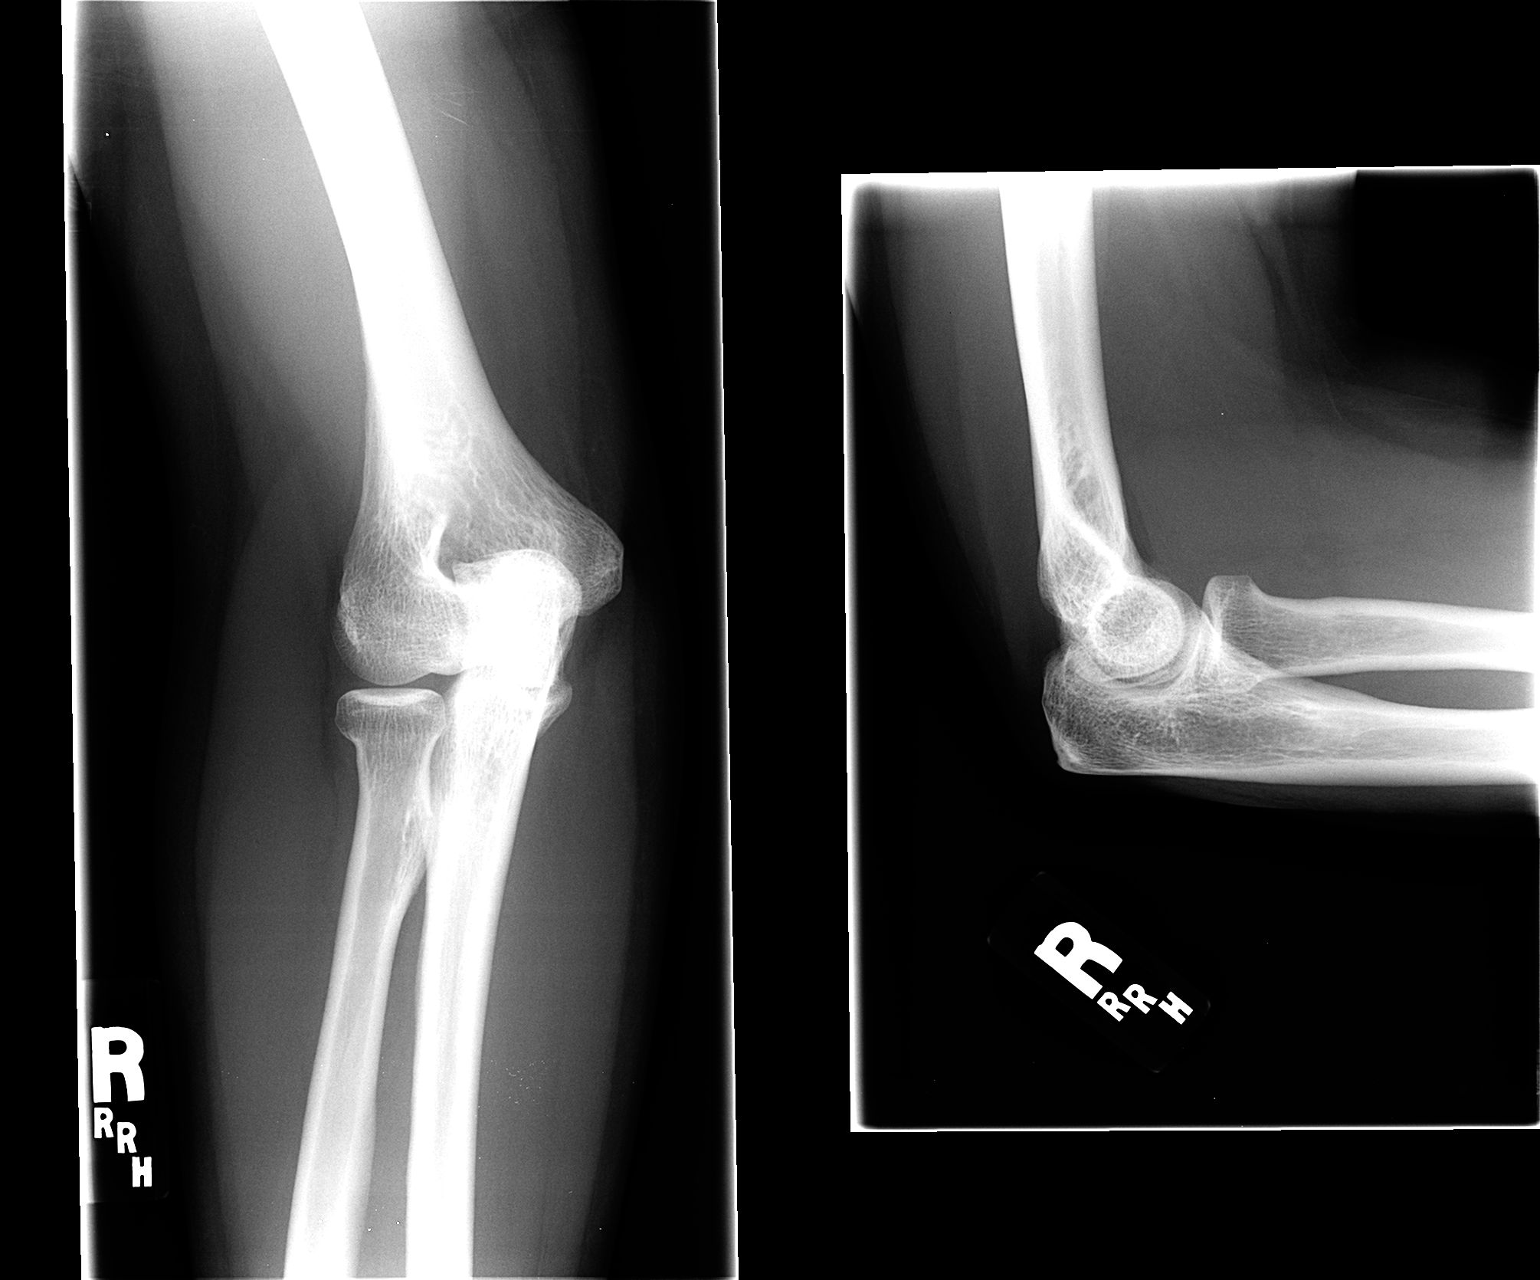

[2 of 2 positions shown; findings below may reference images not displayed]

FINDINGS: No acute osseous or joint abnormality.
IMPRESSION: Negative.

## 2012-07-05 MED ORDER — NAPROXEN 250 MG PO TABS: 250.0000 mg | ORAL_TABLET | Freq: Two times a day (BID) | ORAL | Status: DC

## 2012-07-05 MED ORDER — IBUPROFEN 400 MG PO TABS
400.0000 mg | ORAL_TABLET | Freq: Once | ORAL | Status: AC
Start: 2012-07-05 — End: 2012-07-05
  Administered 2012-07-05: 400 mg via ORAL
  Filled 2012-07-05: qty 1

## 2012-07-05 MED ORDER — ACETAMINOPHEN 325 MG PO TABS
650.0000 mg | ORAL_TABLET | Freq: Once | ORAL | Status: AC
  Administered 2012-07-05: 650 mg via ORAL
  Filled 2012-07-05: qty 2

## 2012-07-05 NOTE — ED Provider Notes (Signed)
History     CSN: [id number]  Arrival date & time 07/05/12  7829   First MD Initiated Contact with Patient 07/05/12 0940      Chief Complaint  Patient presents with  . Arm Pain  . Foot Pain     HPI Pt was seen at 0950.   Per pt, c/o gradual onset and persistence of constant right elbow "pain" that began yesterday.  States she hit her right elbow on the shower yesterday and continues to "hurt."  States she also hit the back of her right foot/ankle on the shower.  Pt has been ambulatory since the incident.  Denies falling, no focal motor weakness, no tingling/numbness in extremities, no open wounds.     History reviewed. No pertinent past medical history.  Past Surgical History  Procedure Laterality Date  . Cholecystectomy      History  Substance Use Topics  . Smoking status: Never Smoker   . Smokeless tobacco: Not on file  . Alcohol Use: No    OB History   Grav Para Term Preterm Abortions TAB SAB Ect Mult Living   1 1 1              Review of Systems ROS: Statement: All systems negative except as marked or noted in the HPI; Constitutional: Negative for fever and chills. ; ; Eyes: Negative for eye pain, redness and discharge. ; ; ENMT: Negative for ear pain, hoarseness, nasal congestion, sinus pressure and sore throat. ; ; Cardiovascular: Negative for chest pain, palpitations, diaphoresis, dyspnea and peripheral edema. ; ; Respiratory: Negative for cough, wheezing and stridor. ; ; Gastrointestinal: Negative for nausea, vomiting, diarrhea, abdominal pain, blood in stool, hematemesis, jaundice and rectal bleeding. . ; ; Genitourinary: Negative for dysuria, flank pain and hematuria. ; ; Musculoskeletal: +right elbow pain. Negative for back pain and neck pain. Negative for swelling and deformity.; ; Skin: Negative for pruritus, rash, abrasions, blisters, bruising and skin lesion.; ; Neuro: Negative for headache, lightheadedness and neck stiffness. Negative for weakness, altered level  of consciousness , altered mental status, extremity weakness, paresthesias, involuntary movement, seizure and syncope.       Allergies  Eggs or egg-derived products  Home Medications   Current Outpatient Rx  Name  Route  Sig  Dispense  Refill  . drospirenone-ethinyl estradiol (OCELLA) 3-0.03 MG tablet   Oral   Take 1 tablet by mouth daily.         . ferrous gluconate (FERGON) 324 MG tablet   Oral   Take 324 mg by mouth daily.           BP 111/63  Pulse 76  Temp(Src) 98 F (36.7 C) (Oral)  Resp 14  Ht 5\' 6"  (1.676 m)  Wt 170 lb (77.111 kg)  BMI 27.45 kg/m2  SpO2 99%  LMP 06/11/2012  Physical Exam 0955: Physical examination:  Nursing notes reviewed; Vital signs and O2 SAT reviewed;  Constitutional: Well developed, Well nourished, Well hydrated, In no acute distress; Head:  Normocephalic, atraumatic; Eyes: EOMI, PERRL, No scleral icterus; ENMT: Mouth and pharynx normal, Mucous membranes moist; Neck: Supple, Full range of motion, No lymphadenopathy; Cardiovascular: Regular rate and rhythm, No murmur, rub, or gallop; Respiratory: Breath sounds clear & equal bilaterally, No rales, rhonchi, wheezes.  Speaking full sentences with ease, Normal respiratory effort/excursion; Chest: Nontender, Movement normal; Abdomen: Soft, Nontender, Nondistended, Normal bowel sounds;; Extremities: Pulses normal, +mild right olecranon tenderness to palp. No edema, no erythema, no open wounds, no ecchymosis, no  deformity. FROM right elbow.  NT right shoulder/wrist/hand/fingers.  NMS intact right hand. NT right knee/ankle/foot. Right foot NMS intact, strong pedal pp, LE muscle compartments soft. FROM right knee/ankle/foot. No right proximal fibular head tenderness, no knee tenderness, no ankle tenderness, no foot tenderness.  No deformity, no erythema, no edema, no ecchymosis, no open wounds.  +plantarflexion of right foot w/calf squeeze.  No palpable gap or tenderness to right Achilles's tendon.  No calf  edema or asymmetry.; Neuro: AA&Ox3, Major CN grossly intact.  Speech clear. Climbs on and off stretcher easily by herself. Gait steady. No gross focal motor or sensory deficits in extremities.; Skin: Color normal, Warm, Dry.   ED Course  Procedures     MDM  MDM Reviewed: previous chart, nursing note and vitals Interpretation: x-ray   Dg Elbow Complete Right 07/05/2012  *RADIOLOGY REPORT*  Clinical Data: Right elbow pain after a fall.  RIGHT ELBOW - COMPLETE 3+ VIEW  Comparison: None.  Findings: No acute osseous or joint abnormality.  IMPRESSION: Negative.   Original Report Authenticated By: Leanna Battles, M.D.      1045:   No acute fx, will tx symptomatically at this time.  Dx and testing d/w pt.  Questions answered.  Verb understanding, agreeable to d/c home with outpt f/u.       Laray Anger, DO 07/07/12 1443

## 2012-07-05 NOTE — ED Notes (Signed)
Hit R elbow on porcelin shower yesterday.  Began swelling and bruising afterward.  Pain is 2/10 at rest, escalates with movment.

## 2012-08-28 ENCOUNTER — Ambulatory Visit (INDEPENDENT_AMBULATORY_CARE_PROVIDER_SITE_OTHER): Payer: Self-pay | Admitting: Family Medicine

## 2012-08-28 ENCOUNTER — Encounter: Payer: Self-pay | Admitting: Family Medicine

## 2012-08-28 VITALS — BP 120/88 | Temp 99.0°F | Wt 184.0 lb

## 2012-08-28 DIAGNOSIS — R21 Rash and other nonspecific skin eruption: Secondary | ICD-10-CM

## 2012-08-28 MED ORDER — LORATADINE 10 MG PO TABS: 10.0000 mg | ORAL_TABLET | Freq: Every day | ORAL | Status: DC

## 2012-08-28 MED ORDER — PREDNISONE 20 MG PO TABS: ORAL_TABLET | ORAL | Status: DC

## 2012-08-28 NOTE — Progress Notes (Signed)
  Subjective:    Patient ID: Dana Mcpherson, female    DOB: 15-Jun-1990, 22 y.o.   MRN: [id number]  Urticaria This is a recurrent problem. The current episode started yesterday. The problem has been waxing and waning since onset. The rash is diffuse. The rash is characterized by itchiness and redness. Associated symptoms include a sore throat. Pertinent negatives include no anorexia, congestion, cough, fatigue, rhinorrhea or vomiting. Past treatments include antihistamine. The treatment provided mild relief. There is no history of allergies.  no wheezing no sob no cp    Review of Systems  Constitutional: Negative for activity change, appetite change and fatigue.  HENT: Positive for sore throat. Negative for congestion, rhinorrhea, neck pain and ear discharge.   Eyes: Negative for discharge.  Respiratory: Negative for cough, chest tightness and wheezing.   Cardiovascular: Negative for chest pain.  Gastrointestinal: Negative for vomiting, abdominal pain and anorexia.  Genitourinary: Negative for frequency and difficulty urinating.  Allergic/Immunologic: Negative for environmental allergies and food allergies.  Neurological: Negative for weakness and headaches.  Psychiatric/Behavioral: Negative for behavioral problems and agitation.       Objective:   Physical Exam  Alert no acute distress. Lungs clear. Heart regular in rhythm. HEENT normal. Diffuse urticaria     Assessment & Plan:  Impression urticaria plan prednisone taper. Antihistamines when necessary. Symptomatic care discussed.

## 2012-08-29 ENCOUNTER — Encounter: Payer: Self-pay | Admitting: *Deleted

## 2012-08-30 ENCOUNTER — Ambulatory Visit (INDEPENDENT_AMBULATORY_CARE_PROVIDER_SITE_OTHER): Payer: Self-pay | Admitting: Nurse Practitioner

## 2012-08-30 ENCOUNTER — Encounter: Payer: Self-pay | Admitting: Nurse Practitioner

## 2012-08-30 VITALS — BP 112/80 | Temp 98.3°F | Ht 66.5 in | Wt 185.0 lb

## 2012-08-30 DIAGNOSIS — L509 Urticaria, unspecified: Secondary | ICD-10-CM

## 2012-08-30 NOTE — Patient Instructions (Signed)
Take Loratadine 10 mg in the morning. Take Benadryl 25 mg at night. Zantac 150 mg once a day.

## 2012-08-31 ENCOUNTER — Encounter: Payer: Self-pay | Admitting: Nurse Practitioner

## 2012-08-31 NOTE — Progress Notes (Signed)
Subjective:  Presents for recheck. Seen on 5/6 for urticaria, currently on prednisone taper. Continues to have off-and-on itching, rash on arms has improved. Continues to have off-and-on migratory itching and rash on her chest legs and back. Running some fever, Max temp 100-101. No headache. No history of tick bite. No wheezing. No cough runny nose sore throat or ear pain. Taking fluids well. Voiding normal limit.  Objective:   BP 112/80  Temp(Src) 98.3 F (36.8 C) (Oral)  Ht 5' 6.5" (1.689 m)  Wt 185 lb (83.915 kg)  BMI 29.42 kg/m2 NAD. Alert, oriented. TMs minimal clear effusion, no erythema. Pharynx clear. Neck supple with minimal adenopathy. Lungs clear. Heart regular rate rhythm. Scratching at different areas during examination but no active hives/rash is noted.  Assessment:Urticaria   Plan: Complete prednisone taper as directed. Continue loratadine in the morning and Benadryl at night. Will hold on further testing were medications at this point unless fever persists. Patient to call back tomorrow if no improvement in fever.

## 2012-12-11 ENCOUNTER — Encounter: Payer: Self-pay | Admitting: Family Medicine

## 2012-12-11 ENCOUNTER — Ambulatory Visit (INDEPENDENT_AMBULATORY_CARE_PROVIDER_SITE_OTHER): Payer: Self-pay | Admitting: Family Medicine

## 2012-12-11 VITALS — BP 102/80 | Temp 98.6°F | Ht 66.5 in | Wt 187.4 lb

## 2012-12-11 DIAGNOSIS — J039 Acute tonsillitis, unspecified: Secondary | ICD-10-CM

## 2012-12-11 MED ORDER — NAPROXEN SODIUM 550 MG PO TABS: 550.0000 mg | ORAL_TABLET | Freq: Two times a day (BID) | ORAL | Status: DC

## 2012-12-11 MED ORDER — ONDANSETRON 4 MG PO TBDP: 4.0000 mg | ORAL_TABLET | Freq: Three times a day (TID) | ORAL | Status: DC | PRN

## 2012-12-11 MED ORDER — AZITHROMYCIN 250 MG PO TABS: ORAL_TABLET | ORAL | Status: DC

## 2012-12-11 NOTE — Progress Notes (Signed)
  Subjective:    Patient ID: Dana Mcpherson, female    DOB: 1991-01-25, 22 y.o.   MRN: [id number]  Sore Throat  This is a new problem. The current episode started in the past 7 days. The problem has been gradually worsening. Neither side of throat is experiencing more pain than the other. The fever has been present for 3 to 4 days. The pain is at a severity of 5/10. The pain is moderate. Associated symptoms include coughing, headaches, a hoarse voice and vomiting. Treatments tried: multisym cong ha med.      Review of Systems  HENT: Positive for hoarse voice.   Respiratory: Positive for cough.   Gastrointestinal: Positive for vomiting.  Neurological: Positive for headaches.       Objective:   Physical Exam  Alert mild malaise hydration good. Vitals reviewed. Lungs clear. Heart regular rate and rhythm. Tonsil exudate swollen tender anterior nodes      Assessment & Plan:  Impression acute exudative tonsillitis with secondary systemic symptoms plan Z-Pak and Zofran when necessary and Anaprox when necessary for headaches. WSL

## 2012-12-12 ENCOUNTER — Telehealth: Payer: Self-pay | Admitting: Family Medicine

## 2012-12-12 NOTE — Telephone Encounter (Signed)
Was seen on 12/11/2012.  Patient states her throat feels like it is on fire.  States she has taken antibiotic as instructed.  Wants to know what she can do to help these symptoms?  Wants a nurse or Dr to call her  Please call Patient. Thanks

## 2012-12-12 NOTE — Telephone Encounter (Signed)
Advised patient to give medication a few more days to kick in and work. Just started antibiotic yesterday. Patient verbalized understanding.

## 2012-12-14 ENCOUNTER — Telehealth: Payer: Self-pay | Admitting: Family Medicine

## 2012-12-14 ENCOUNTER — Other Ambulatory Visit: Payer: Self-pay | Admitting: *Deleted

## 2012-12-14 MED ORDER — CEFPROZIL 500 MG PO TABS: 500.0000 mg | ORAL_TABLET | Freq: Two times a day (BID) | ORAL | Status: DC

## 2012-12-14 NOTE — Telephone Encounter (Signed)
Discussed with patient. cefzil sent to pharm. Pt notified work excuse ok. Please give work excuse for 08/21 and 08/22.

## 2012-12-14 NOTE — Telephone Encounter (Signed)
Pt calling to say that she is still vomiting an having a hard time keeping down her antibiotics. She is wondering if she needs another round? Or what would you recommend she do at this point? Please advise PT [facility]

## 2012-12-14 NOTE — Telephone Encounter (Signed)
w e ok. Change to cefzil 500 bid ten d

## 2012-12-14 NOTE — Telephone Encounter (Signed)
Vomiting up  everything she eats and her antibiotics. Taking zofran odt every 4-6 hours. Requesting work excuse for for 8/21 and 8/22.

## 2012-12-17 ENCOUNTER — Encounter: Payer: Self-pay | Admitting: Family Medicine

## 2013-02-05 ENCOUNTER — Ambulatory Visit (HOSPITAL_COMMUNITY)
Admission: RE | Admit: 2013-02-05 | Discharge: 2013-02-05 | Disposition: A | Discharge status: Home / Self Care | Payer: Self-pay | Source: Ambulatory Visit | Attending: Family Medicine | Admitting: Family Medicine

## 2013-02-05 ENCOUNTER — Ambulatory Visit (INDEPENDENT_AMBULATORY_CARE_PROVIDER_SITE_OTHER): Payer: Self-pay | Admitting: Family Medicine

## 2013-02-05 ENCOUNTER — Encounter: Payer: Self-pay | Admitting: Family Medicine

## 2013-02-05 VITALS — BP 122/80 | Ht 66.5 in | Wt 204.6 lb

## 2013-02-05 DIAGNOSIS — M25519 Pain in unspecified shoulder: Secondary | ICD-10-CM | POA: Insufficient documentation

## 2013-02-05 DIAGNOSIS — M25511 Pain in right shoulder: Secondary | ICD-10-CM

## 2013-02-05 IMAGING — CR DG SHOULDER 2+V*R*
3 series · 3 of 3 positions shown · non-contrast
Comparison: None.

CLINICAL DATA: Pain

EXAM:
RIGHT SHOULDER - 2+ VIEW

[view not recorded (1 of 3)]
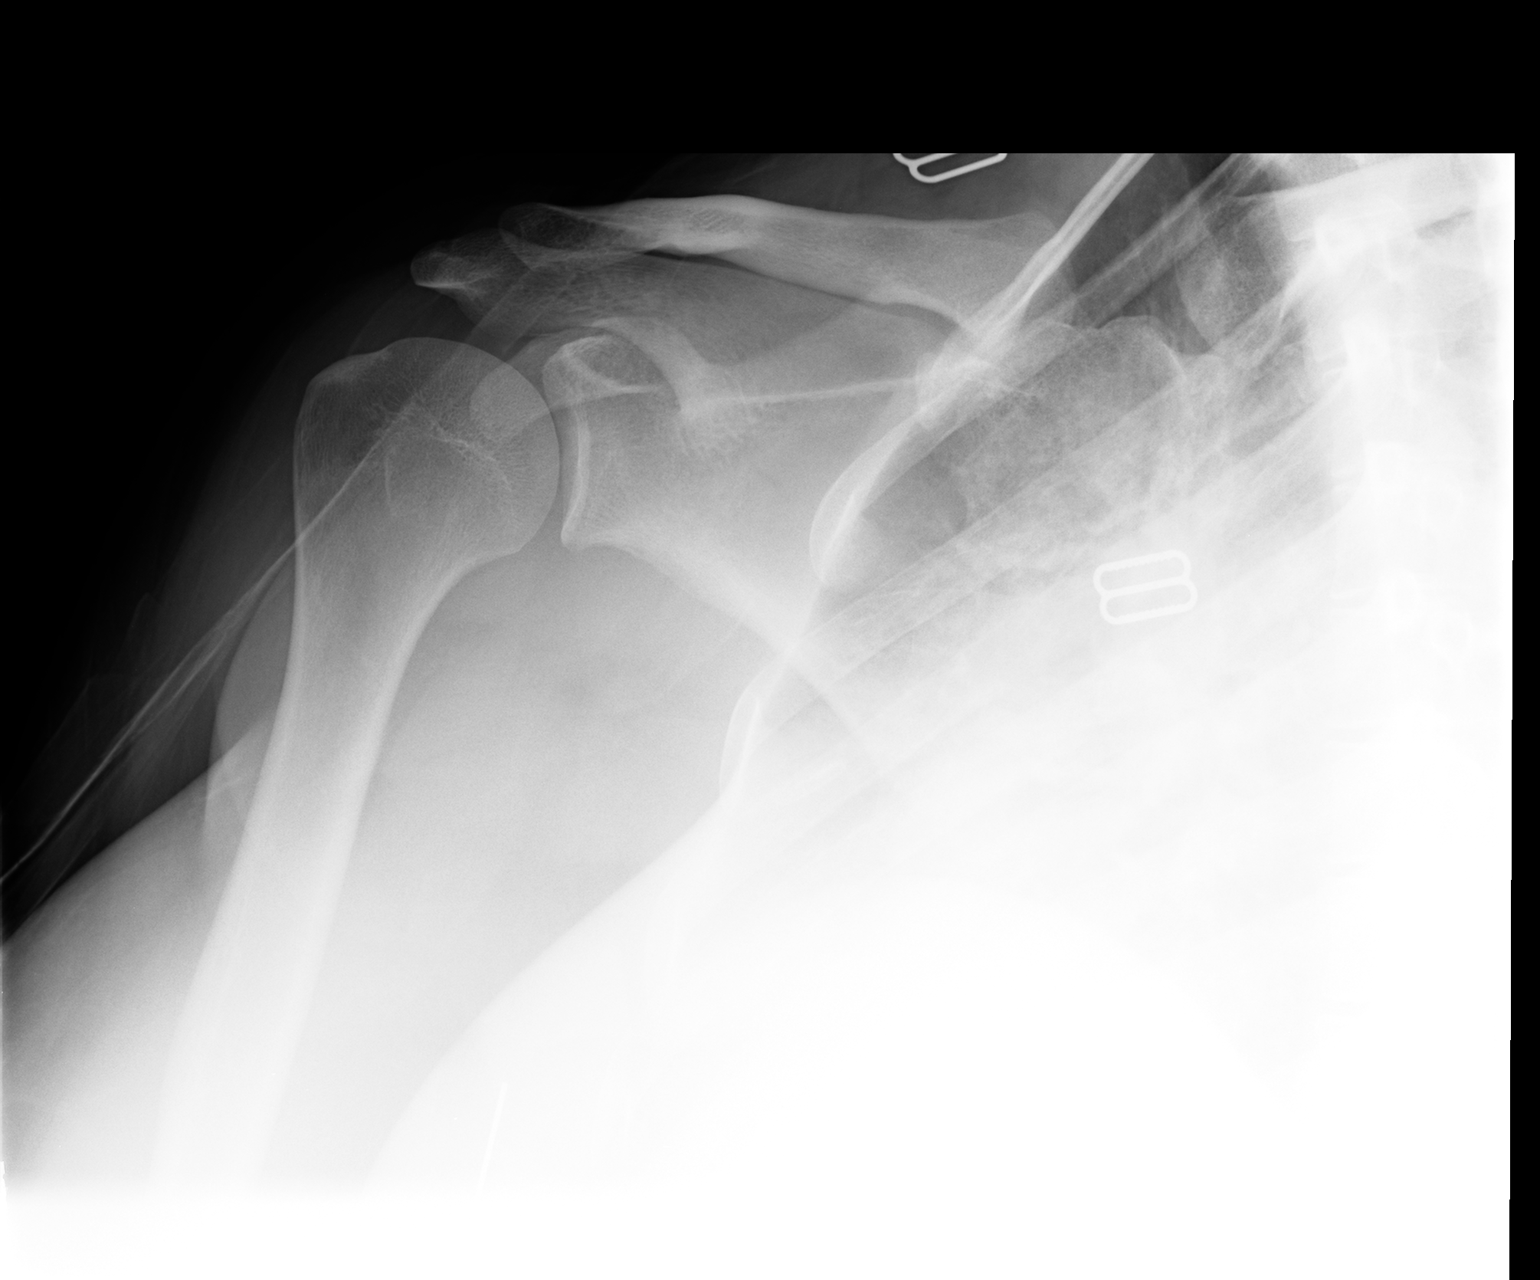

[view not recorded (2 of 3)]
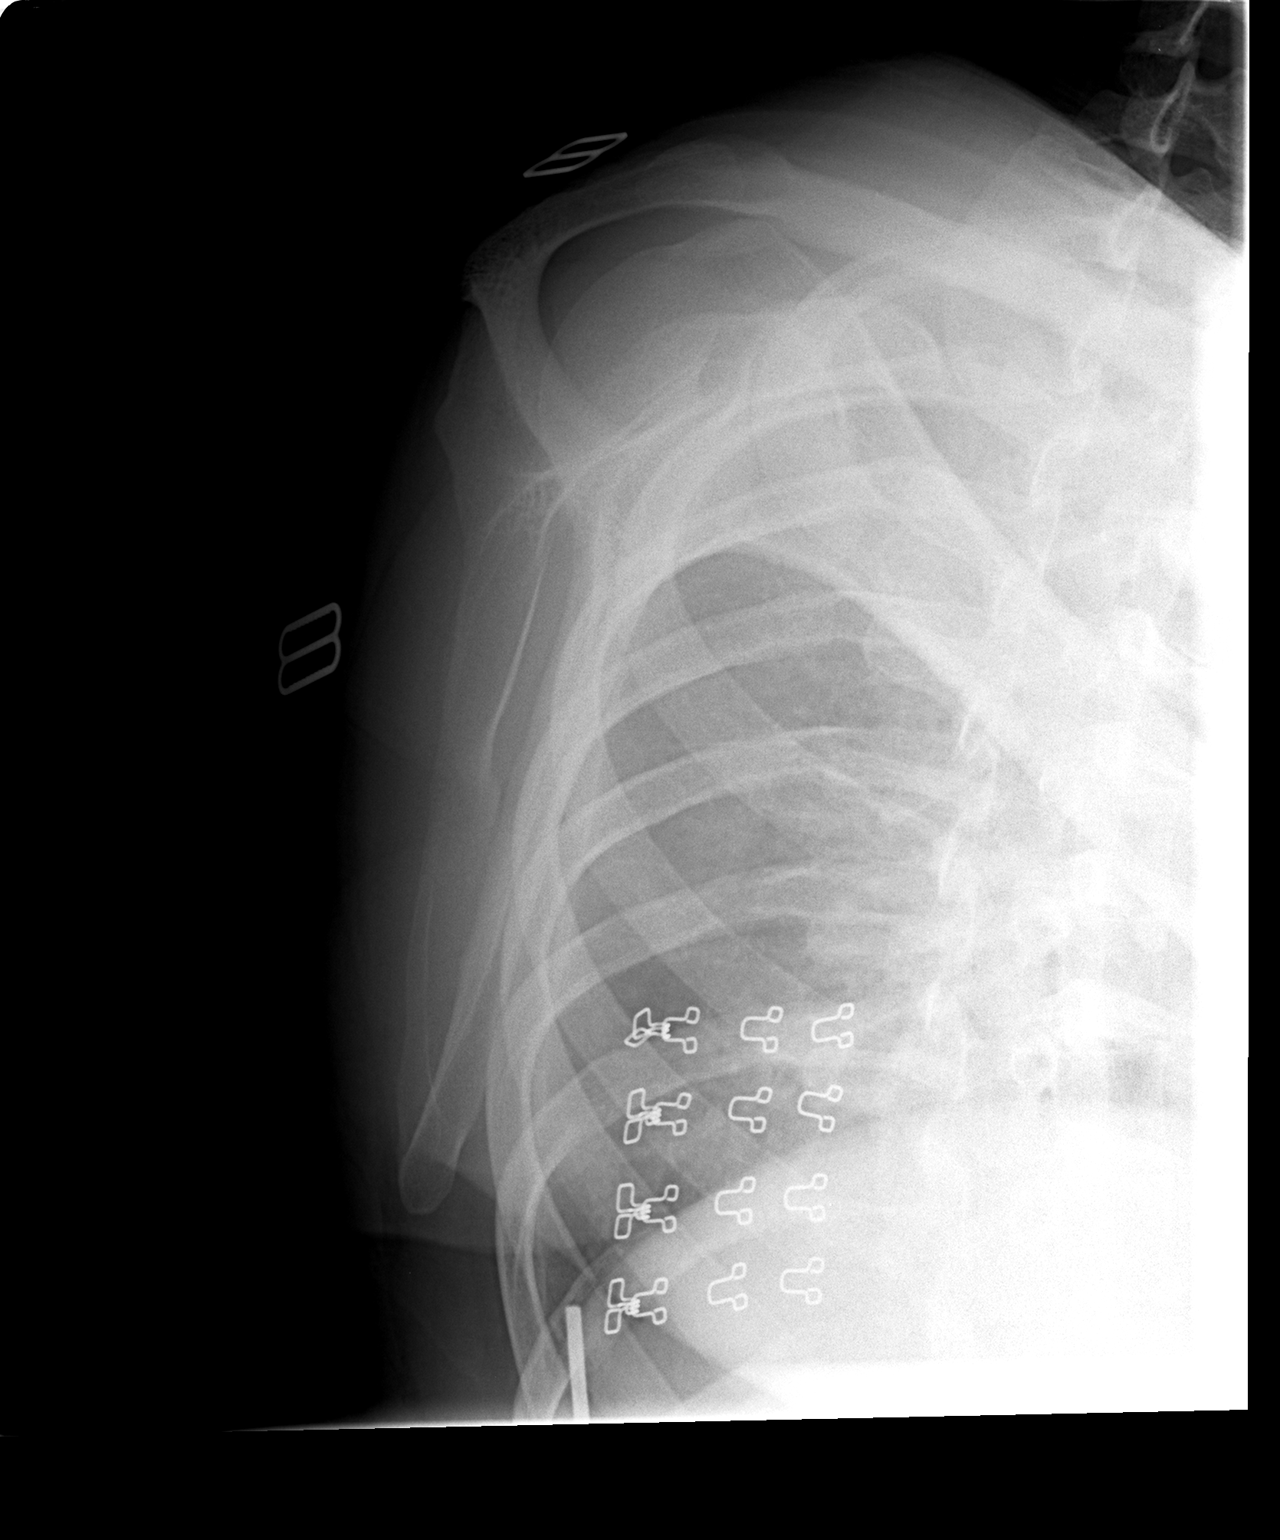

[view not recorded (3 of 3)]
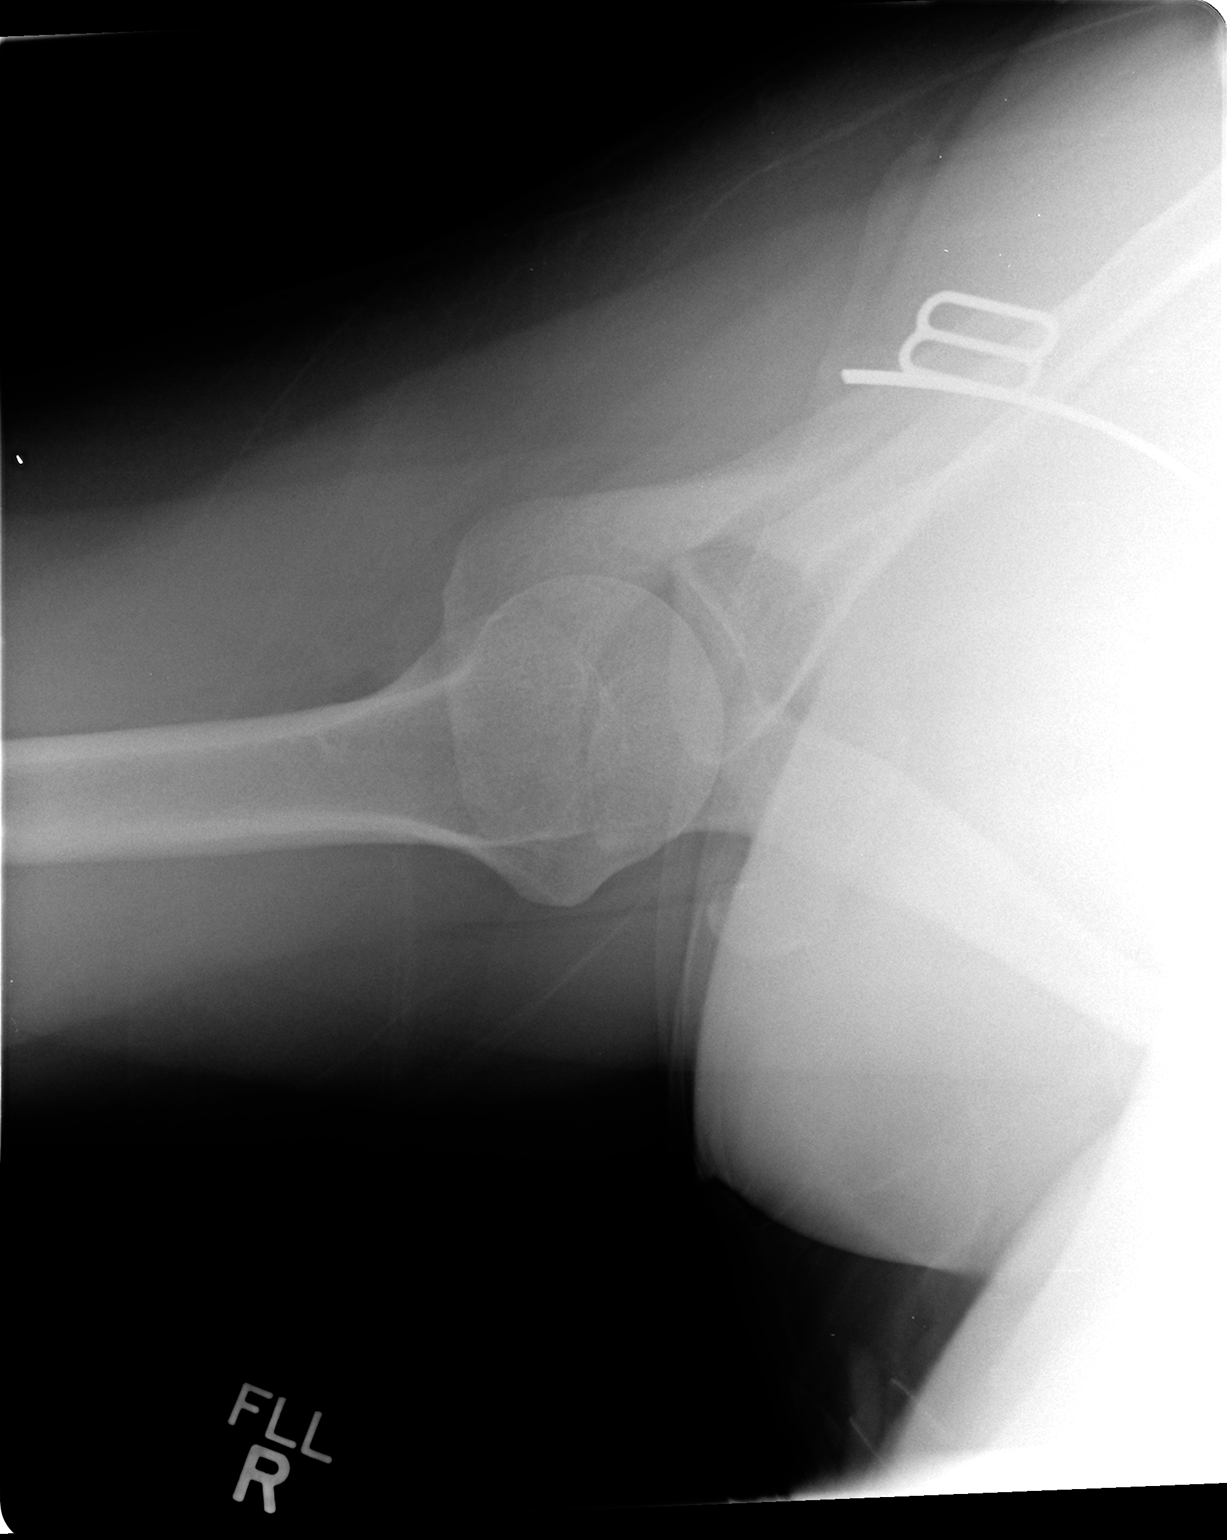

[3 of 3 positions shown; findings below may reference images not displayed]

FINDINGS: Frontal, Y scapular, and axillary images were obtained. There is no
fracture or dislocation. Joint spaces appear intact. No erosive
change or intra-articular calcification.
IMPRESSION: No abnormality noted.

## 2013-02-05 MED ORDER — PREDNISONE 20 MG PO TABS: ORAL_TABLET | ORAL | Status: DC
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
## 2013-03-13 ENCOUNTER — Encounter: Payer: Self-pay | Admitting: Family Medicine

## 2013-03-13 ENCOUNTER — Ambulatory Visit (INDEPENDENT_AMBULATORY_CARE_PROVIDER_SITE_OTHER): Payer: Self-pay | Admitting: Family Medicine

## 2013-03-13 VITALS — BP 122/82 | Ht 66.5 in | Wt 212.0 lb

## 2013-03-13 DIAGNOSIS — N912 Amenorrhea, unspecified: Secondary | ICD-10-CM

## 2013-03-13 LAB — POCT URINE PREGNANCY: Preg Test, Ur: NEGATIVE

## 2013-03-13 NOTE — Progress Notes (Signed)
  Subjective:    Patient ID: Dana Mcpherson, female    DOB: Aug 29, 1990, 22 y.o.   MRN: [id number]  HPI Patient arrives with irregular cycle- spotted a little this month but no true cycle-3 weeks late. Stopped birth control earlier this year without problems-Also noticing weight gain for last few months-has gained almost 30 lbs since August. Results for orders placed in visit on 03/13/13  POCT URINE PREGNANCY      Result Value Range   Preg Test, Ur Negative     Very regular  Stress level no sig change.  Has gained weight  [facility]aying [facility], [facility]  Neg chlamyd and neg pap, no sig dischare, just normal, when on ocella cut down on cramps and pmd  Using condoms to protect fr sex, non smoker   Review of Systems    otherwise negative Objective:   Physical Exam  Alert no acute distress lungs clear heart regular in rhythm abdomen benign pelvic exam wet prep negative mild tenderness no discrete tenderness.      Assessment & Plan:  Impression abnormal menstrual cycle x1 with some cramping negative urine pregnancy test plan symptomatic care only if 2 tablets twice a day. If regular menses persists may need birth control pills warning signs discussed. WSL

## 2013-05-02 ENCOUNTER — Encounter: Payer: Self-pay | Admitting: Nurse Practitioner

## 2013-05-02 ENCOUNTER — Ambulatory Visit (INDEPENDENT_AMBULATORY_CARE_PROVIDER_SITE_OTHER): Payer: 59 | Admitting: Nurse Practitioner

## 2013-05-02 VITALS — BP 132/88 | Temp 98.5°F | Ht 66.5 in | Wt 226.0 lb

## 2013-05-02 DIAGNOSIS — H8110 Benign paroxysmal vertigo, unspecified ear: Secondary | ICD-10-CM

## 2013-05-02 DIAGNOSIS — R5383 Other fatigue: Secondary | ICD-10-CM

## 2013-05-02 DIAGNOSIS — N912 Amenorrhea, unspecified: Secondary | ICD-10-CM

## 2013-05-02 DIAGNOSIS — R5381 Other malaise: Secondary | ICD-10-CM

## 2013-05-02 LAB — CBC WITH DIFFERENTIAL/PLATELET
BASOS PCT: 0 % (ref 0–1)
Basophils Absolute: 0 10*3/uL (ref 0.0–0.1)
EOS PCT: 2 % (ref 0–5)
Eosinophils Absolute: 0.2 10*3/uL (ref 0.0–0.7)
HEMATOCRIT: 39.9 % (ref 36.0–46.0)
HEMOGLOBIN: 14.1 g/dL (ref 12.0–15.0)
LYMPHS PCT: 28 % (ref 12–46)
Lymphs Abs: 2.7 10*3/uL (ref 0.7–4.0)
MCH: 29.9 pg (ref 26.0–34.0)
MCHC: 35.3 g/dL (ref 30.0–36.0)
MCV: 84.7 fL (ref 78.0–100.0)
MONO ABS: 0.9 10*3/uL (ref 0.1–1.0)
MONOS PCT: 9 % (ref 3–12)
NEUTROS ABS: 5.9 10*3/uL (ref 1.7–7.7)
Neutrophils Relative %: 61 % (ref 43–77)
Platelets: 338 10*3/uL (ref 150–400)
RBC: 4.71 MIL/uL (ref 3.87–5.11)
RDW: 13.7 % (ref 11.5–15.5)
WBC: 9.7 10*3/uL (ref 4.0–10.5)

## 2013-05-02 LAB — BASIC METABOLIC PANEL
BUN: 7 mg/dL (ref 6–23)
CALCIUM: 9.8 mg/dL (ref 8.4–10.5)
CO2: 29 meq/L (ref 19–32)
Chloride: 102 mEq/L (ref 96–112)
Creat: 0.62 mg/dL (ref 0.50–1.10)
GLUCOSE: 72 mg/dL (ref 70–99)
POTASSIUM: 4 meq/L (ref 3.5–5.3)
SODIUM: 140 meq/L (ref 135–145)

## 2013-05-02 LAB — HEPATIC FUNCTION PANEL
ALT: 23 U/L (ref 0–35)
AST: 20 U/L (ref 0–37)
Albumin: 4.7 g/dL (ref 3.5–5.2)
Alkaline Phosphatase: 89 U/L (ref 39–117)
BILIRUBIN DIRECT: 0.1 mg/dL (ref 0.0–0.3)
BILIRUBIN INDIRECT: 0.3 mg/dL (ref 0.0–0.9)
TOTAL PROTEIN: 7.5 g/dL (ref 6.0–8.3)
Total Bilirubin: 0.4 mg/dL (ref 0.3–1.2)

## 2013-05-02 LAB — POCT URINE PREGNANCY: PREG TEST UR: NEGATIVE

## 2013-05-02 NOTE — Patient Instructions (Signed)
Meclizine 25 mg every 6 hours as needed for dizziness. 

## 2013-05-03 LAB — HCG, SERUM, QUALITATIVE: Preg, Serum: NEGATIVE

## 2013-05-03 LAB — TSH: TSH: 2.417 u[IU]/mL (ref 0.350–4.500)

## 2013-05-04 ENCOUNTER — Encounter: Payer: Self-pay | Admitting: Nurse Practitioner

## 2013-05-04 NOTE — Progress Notes (Signed)
Subjective:  Presents for several issues. Has had sporadic light cycles for the past 3 months. Mild breast tenderness at times, no breast discharge or drainage. No fever. No tingling headache. Occasional blurred vision. Occasional dizziness. This has occurred off and on for the past 3 months, seems to be more frequent. Nausea but no vomiting. No head congestion runny nose cough sore throat or ear pain. Generalized fatigue. Some weight gain. No numbness or weakness of the face arms or legs. No difficulty speaking or swallowing. Has noticed some mild short-term memory loss. Ellene Route is present with her today. Has had an eye exam, was to hold that she needs to wear her glasses. Has been having unprotected sex.  Objective:   BP 132/88  Temp(Src) 98.5 F (36.9 C) (Oral)  Ht 5' 6.5" (1.689 m)  Wt 226 lb (102.513 kg)  BMI 35.94 kg/m2  LMP 04/25/2013 NAD. Alert, oriented. Mildly anxious affect. TMs mild clear effusion, no erythema. Pharynx nonerythematous with cloudy PND noted. Neck supple with mild soft nontender anterior adenopathy. Lungs clear. Heart regular rate rhythm. Abdomen soft obese nondistended nontender. Funduscopic difficulty focusing, no obvious abnormalities. Pupils equal and reactive. EOMs intact without nystagmus. Point-to-point localization normal limit. Gait normal limit. Romberg negative. Reflexes normal.   Assessment:Absence of menstruation - Plan: POCT urine pregnancy  Benign paroxysmal positional vertigo  Other malaise and fatigue - Plan: Basic metabolic panel, CBC with Differential, Hepatic function panel, TSH, hCG, serum, qualitative  Plan: Lab work pending. Warning signs reviewed. Further followup based on lab reports, call back sooner or go to ED if symptoms worsen.

## 2013-05-08 ENCOUNTER — Telehealth: Payer: Self-pay | Admitting: Family Medicine

## 2013-05-08 NOTE — Telephone Encounter (Signed)
Patient  Calling for result of lab work she had done.

## 2013-05-20 ENCOUNTER — Ambulatory Visit (INDEPENDENT_AMBULATORY_CARE_PROVIDER_SITE_OTHER): Payer: 59 | Admitting: Nurse Practitioner

## 2013-05-20 ENCOUNTER — Encounter: Payer: Self-pay | Admitting: Nurse Practitioner

## 2013-05-20 VITALS — BP 110/80 | Ht 66.5 in | Wt 221.5 lb

## 2013-05-20 DIAGNOSIS — N938 Other specified abnormal uterine and vaginal bleeding: Secondary | ICD-10-CM

## 2013-05-20 DIAGNOSIS — E162 Hypoglycemia, unspecified: Secondary | ICD-10-CM

## 2013-05-20 DIAGNOSIS — N925 Other specified irregular menstruation: Secondary | ICD-10-CM

## 2013-05-20 DIAGNOSIS — N949 Unspecified condition associated with female genital organs and menstrual cycle: Secondary | ICD-10-CM

## 2013-05-20 DIAGNOSIS — J322 Chronic ethmoidal sinusitis: Secondary | ICD-10-CM

## 2013-05-20 MED ORDER — AMOXICILLIN-POT CLAVULANATE 875-125 MG PO TABS: 1.0000 | ORAL_TABLET | Freq: Two times a day (BID) | ORAL | Status: DC

## 2013-05-20 NOTE — Patient Instructions (Signed)
Goal: 20-25 lbs weight loss Add protein or fiber to every meal Small frequent meals Avoid sugar and simple carbs

## 2013-05-23 ENCOUNTER — Encounter: Payer: Self-pay | Admitting: Nurse Practitioner

## 2013-05-23 DIAGNOSIS — N938 Other specified abnormal uterine and vaginal bleeding: Secondary | ICD-10-CM | POA: Insufficient documentation

## 2013-05-23 NOTE — Progress Notes (Signed)
Subjective:  Presents for followup see previous note. Continues to have occasional tingling headache 2-3 times per week. Usually lasting several hours. Has noticed this occurs if she misses meals. Associated symptoms include feeling hot, dizzy, near syncope, sweating, pale. Episode occurred recently, drinks regular soda headache occurred after this episode. Continues to have scanty irregular menstrual cycles since gaining some weight. Positive family history of diabetes including her sister and maternal great grandmother. Has also noticed increased acne, denies any excessive hair growth. Over the past 5 days has developed ethmoid sinus area headache, producing green mucus. Postnasal drainage. Sore throat. No ear pain. Some cough. No wheezing. Note she just started a menstrual cycle this morning. Sees [facility] for her physicals.  Objective:   BP 110/80  Ht 5' 6.5" (1.689 m)  Wt 221 lb 8 oz (100.472 kg)  BMI 35.22 kg/m2  LMP 04/25/2013 NAD. Alert, oriented. TMs clear effusion, no erythema. Pharynx injected with PND noted. Neck supple with mild soft anterior adenopathy. Lungs clear. Heart regular rate rhythm. Significant central obesity noted.  Assessment:DUB (dysfunctional uterine bleeding) - Plan: Testosterone, Estradiol  Hypoglycemia - Plan: Insulin, fasting  Ethmoid sinusitis  possible PCOS  Morbid obesity  Plan: Meds ordered this encounter  Medications  . amoxicillin-clavulanate (AUGMENTIN) 875-125 MG per tablet    Sig: Take 1 tablet by mouth 2 (two) times daily.    Dispense:  20 tablet    Refill:  0    Order Specific Question:  Supervising Provider    Answer:  Mikey Kirschner [[id number]]   OTC meds as directed for congestion. Discussed importance of healthy diet weight loss and regular activity. Review risk for developing diabetes. Recommend regular meals and snacks. Avoid skipping meals or high simple carb loads. Recommend stopping all regular soda sweetened tea and fruit  juices. Further followup based on lab results, will consider metformin versus phentermine based on this.

## 2013-05-23 NOTE — Assessment & Plan Note (Signed)
OTC meds as directed for congestion. Discussed importance of healthy diet weight loss and regular activity. Review risk for developing diabetes. Recommend regular meals and snacks. Avoid skipping meals or high simple carb loads. Recommend stopping all regular soda sweetened tea and fruit juices. Further followup based on lab results, will consider metformin versus phentermine based on this.

## 2013-05-23 NOTE — Assessment & Plan Note (Signed)
Consider diagnosis of possible PCOS. OTC meds as directed for congestion. Discussed importance of healthy diet weight loss and regular activity. Review risk for developing diabetes. Recommend regular meals and snacks. Avoid skipping meals or high simple carb loads. Recommend stopping all regular soda sweetened tea and fruit juices. Further followup based on lab results, will consider metformin versus phentermine based on this.

## 2013-05-25 LAB — INSULIN, FASTING: Insulin fasting, serum: 38 u[IU]/mL — ABNORMAL HIGH (ref 3–28)

## 2013-05-25 LAB — ESTRADIOL: Estradiol: 37.7 pg/mL

## 2013-05-25 LAB — TESTOSTERONE: Testosterone: 78 ng/dL — ABNORMAL HIGH (ref 10–70)

## 2013-05-27 ENCOUNTER — Other Ambulatory Visit: Payer: Self-pay | Admitting: Nurse Practitioner

## 2013-05-27 MED ORDER — METFORMIN HCL 500 MG PO TABS: 500.0000 mg | ORAL_TABLET | Freq: Two times a day (BID) | ORAL | Status: DC

## 2013-05-28 NOTE — Progress Notes (Signed)
Patient notified and verbalized understanding of the test results. No further questions. 

## 2013-06-17 ENCOUNTER — Ambulatory Visit (INDEPENDENT_AMBULATORY_CARE_PROVIDER_SITE_OTHER): Payer: 59 | Admitting: Family Medicine

## 2013-06-17 ENCOUNTER — Encounter: Payer: Self-pay | Admitting: Family Medicine

## 2013-06-17 VITALS — Temp 97.9°F | Ht 66.5 in | Wt 222.0 lb

## 2013-06-17 DIAGNOSIS — R300 Dysuria: Secondary | ICD-10-CM

## 2013-06-17 LAB — POCT URINALYSIS DIPSTICK
Spec Grav, UA: 1.015
pH, UA: 6

## 2013-06-17 MED ORDER — CIPROFLOXACIN HCL 500 MG PO TABS: 500.0000 mg | ORAL_TABLET | Freq: Two times a day (BID) | ORAL | Status: AC

## 2013-06-17 NOTE — Progress Notes (Signed)
   Subjective:    Patient ID: Dana Mcpherson, female    DOB: 04-09-1991, 23 y.o.   MRN: [id number]  Dysuria  This is a new problem. The current episode started in the past 7 days. Associated symptoms include frequency and urgency. Pertinent negatives include no chills.  thoiught had yeast infxn tried 2 otc w/o help Now with dysuria  PMH benign  Review of Systems  Constitutional: Negative for fever, chills and activity change.  HENT: Negative for congestion, ear pain and rhinorrhea.   Eyes: Negative for discharge.  Respiratory: Positive for cough. Negative for shortness of breath and wheezing.   Cardiovascular: Negative for chest pain.  Genitourinary: Positive for dysuria, urgency and frequency.       Objective:   Physical Exam  Lungs are clear hearts regular pulse normal abdomen soft  UA with occasional WBC    Assessment & Plan:  UTI antibiotics prescribed warning signs discussed followup if problems if ongoing trouble may need gynecologic examination

## 2013-06-19 LAB — URINE CULTURE

## 2013-07-30 ENCOUNTER — Encounter: Payer: Self-pay | Admitting: Family Medicine

## 2013-07-30 ENCOUNTER — Ambulatory Visit (INDEPENDENT_AMBULATORY_CARE_PROVIDER_SITE_OTHER): Payer: 59 | Admitting: Family Medicine

## 2013-07-30 VITALS — BP 126/82 | Ht 66.5 in | Wt 213.0 lb

## 2013-07-30 DIAGNOSIS — J322 Chronic ethmoidal sinusitis: Secondary | ICD-10-CM

## 2013-07-30 MED ORDER — CEFDINIR 300 MG PO CAPS: 300.0000 mg | ORAL_CAPSULE | Freq: Two times a day (BID) | ORAL | Status: DC

## 2013-07-30 NOTE — Progress Notes (Signed)
   Subjective:    Patient ID: Dana Mcpherson, female    DOB: 1991-01-12, 23 y.o.   MRN: [id number]  Fever  This is a new problem. The current episode started in the past 7 days. Associated symptoms include congestion, ear pain, muscle aches and a sore throat. She has tried NSAIDs (dayquil and pm cold med) for the symptoms.   Sat really bad headache, thought it was allergies  No energy wiped out achey  rn high fever  Aching all over  102  Max temp   Headache  Pain top of head then throat then felt bad   Took dayquil and cold med p    Review of Systems  Constitutional: Positive for fever.  HENT: Positive for congestion, ear pain and sore throat.        Objective:   Physical Exam Alert mild now lays. Vital stable HET moderate his congestion frontal tenderness pharynx erythematous neck supple. Lungs clear heart regular in rhythm.       Assessment & Plan:  Impression probable post flu sinusitis plan symptomatic care discussed. Omnicef twice a day 10 days. Warning signs discussed. WSL

## 2013-08-05 ENCOUNTER — Encounter: Payer: Self-pay | Admitting: Family Medicine

## 2013-08-05 ENCOUNTER — Ambulatory Visit (INDEPENDENT_AMBULATORY_CARE_PROVIDER_SITE_OTHER): Payer: 59 | Admitting: Family Medicine

## 2013-08-05 VITALS — BP 112/78 | Temp 98.2°F | Ht 66.5 in | Wt 212.0 lb

## 2013-08-05 DIAGNOSIS — B029 Zoster without complications: Secondary | ICD-10-CM

## 2013-08-05 MED ORDER — MUPIROCIN CALCIUM 2 % EX CREA: 1.0000 "application " | TOPICAL_CREAM | Freq: Two times a day (BID) | CUTANEOUS | Status: DC

## 2013-08-05 MED ORDER — VALACYCLOVIR HCL 1 G PO TABS: 1000.0000 mg | ORAL_TABLET | Freq: Three times a day (TID) | ORAL | Status: DC

## 2013-08-05 NOTE — Patient Instructions (Signed)
Shingles Shingles (herpes zoster) is an infection that is caused by the same virus that causes chickenpox (varicella). The infection causes a painful skin rash and fluid-filled blisters, which eventually break open, crust over, and heal. It may occur in any area of the body, but it usually affects only one side of the body or face. The pain of shingles usually lasts about 1 month. However, some people with shingles may develop long-term (chronic) pain in the affected area of the body. Shingles often occurs many years after the person had chickenpox. It is more common:  In people older than 50 years.  In people with weakened immune systems, such as those with HIV, AIDS, or cancer.  In people taking medicines that weaken the immune system, such as transplant medicines.  In people under great stress. CAUSES  Shingles is caused by the varicella zoster virus (VZV), which also causes chickenpox. After a person is infected with the virus, it can remain in the person's body for years in an inactive state (dormant). To cause shingles, the virus reactivates and breaks out as an infection in a nerve root. The virus can be spread from person to person (contagious) through contact with open blisters of the shingles rash. It will only spread to people who have not had chickenpox. When these people are exposed to the virus, they may develop chickenpox. They will not develop shingles. Once the blisters scab over, the person is no longer contagious and cannot spread the virus to others. SYMPTOMS  Shingles shows up in stages. The initial symptoms may be pain, itching, and tingling in an area of the skin. This pain is usually described as burning, stabbing, or throbbing.In a few days or weeks, a painful red rash will appear in the area where the pain, itching, and tingling were felt. The rash is usually on one side of the body in a band or belt-like pattern. Then, the rash usually turns into fluid-filled blisters. They  will scab over and dry up in approximately 2 3 weeks. Flu-like symptoms may also occur with the initial symptoms, the rash, or the blisters. These may include:  Fever.  Chills.  Headache.  Upset stomach. DIAGNOSIS  Your caregiver will perform a skin exam to diagnose shingles. Skin scrapings or fluid samples may also be taken from the blisters. This sample will be examined under a microscope or sent to a lab for further testing. TREATMENT  There is no specific cure for shingles. Your caregiver will likely prescribe medicines to help you manage the pain, recover faster, and avoid long-term problems. This may include antiviral drugs, anti-inflammatory drugs, and pain medicines. HOME CARE INSTRUCTIONS   Take a cool bath or apply cool compresses to the area of the rash or blisters as directed. This may help with the pain and itching.   Only take over-the-counter or prescription medicines as directed by your caregiver.   Rest as directed by your caregiver.  Keep your rash and blisters clean with mild soap and cool water or as directed by your caregiver.  Do not pick your blisters or scratch your rash. Apply an anti-itch cream or numbing creams to the affected area as directed by your caregiver.  Keep your shingles rash covered with a loose bandage (dressing).  Avoid skin contact with:  Babies.   Pregnant women.   Children with eczema.   Elderly people with transplants.   People with chronic illnesses, such as leukemia or AIDS.   Wear loose-fitting clothing to help ease   the pain of material rubbing against the rash.  Keep all follow-up appointments with your caregiver.If the area involved is on your face, you may receive a referral for follow-up to a specialist, such as an eye doctor (ophthalmologist) or an ear, nose, and throat (ENT) doctor. Keeping all follow-up appointments will help you avoid eye complications, chronic pain, or disability.  SEEK IMMEDIATE MEDICAL  CARE IF:   You have facial pain, pain around the eye area, or loss of feeling on one side of your face.  You have ear pain or ringing in your ear.  You have loss of taste.  Your pain is not relieved with prescribed medicines.   Your redness or swelling spreads.   You have more pain and swelling.  Your condition is worsening or has changed.   You have a feveror persistent symptoms for more than 2 3 days.  You have a fever and your symptoms suddenly get worse. MAKE SURE YOU:  Understand these instructions.  Will watch your condition.  Will get help right away if you are not doing well or get worse. Document Released: 04/11/2005 Document Revised: 01/04/2012 Document Reviewed: 11/24/2011 ExitCare Patient Information 2014 [employer].  

## 2013-08-05 NOTE — Progress Notes (Signed)
   Subjective:    Patient ID: Dana Mcpherson, female    DOB: [birth year], 23 y.o.   MRN: [id number]  Rash This is a new problem. The current episode started in the past 7 days. The affected locations include the back (right breast). The rash is characterized by itchiness, swelling, redness, pain and burning. It is unknown if there was an exposure to a precipitant. Treatments tried: benadryl, ibuprofen. The treatment provided no relief.    Back itching and sensitive and tender  Breakout started last wk  Took bendryl and ibuprofen and calamine  Also notes right-sided back pain which occurred a few days before the rash erupted.  Did have chickenpox as a child.  Not going away  Review of Systems  Skin: Positive for rash.   no fever no chills no cough ROS otherwise negative     Objective:   Physical Exam  Alert no acute distress. Vitals stable. Lungs clear. Heart regular rate rhythm. Right posterior thorax and anterolateral breast classic shingles eruption.      Assessment & Plan:  Impression shingles plan discussed at length. 15 minutes spent most in discussion. Natural history discussed. Dr. Gwenlyn Perking gram 3 times a day 7 days. Bactroban to lesions. Patient declines narcotics. WSL

## 2013-10-21 ENCOUNTER — Telehealth: Payer: Self-pay | Admitting: Nurse Practitioner

## 2013-10-21 NOTE — Telephone Encounter (Signed)
Pt is calling to go ahead with a referral to an endocrinologist at this time  For her Poly Cystic Ovarian SYndrome  Called her insurance an she needs a referral from [place]   The meds that Hoyle Sauer prescribed just make her sick

## 2013-10-22 ENCOUNTER — Other Ambulatory Visit: Payer: Self-pay | Admitting: Nurse Practitioner

## 2013-10-22 DIAGNOSIS — R7989 Other specified abnormal findings of blood chemistry: Secondary | ICD-10-CM

## 2013-10-22 NOTE — Telephone Encounter (Signed)
Stop Metformin if it causes her to "be sick". I will send in referral per her request.

## 2013-10-22 NOTE — Telephone Encounter (Signed)
Patient notified

## 2013-11-05 ENCOUNTER — Encounter: Payer: Self-pay | Admitting: Family Medicine

## 2013-11-26 ENCOUNTER — Emergency Department (HOSPITAL_COMMUNITY)
Admission: EM | Admit: 2013-11-26 | Discharge: 2013-11-26 | Disposition: A | Discharge status: Home / Self Care | Payer: 59 | Attending: Emergency Medicine | Admitting: Emergency Medicine

## 2013-11-26 ENCOUNTER — Encounter (HOSPITAL_COMMUNITY): Payer: Self-pay | Admitting: Emergency Medicine

## 2013-11-26 DIAGNOSIS — Z792 Long term (current) use of antibiotics: Secondary | ICD-10-CM | POA: Diagnosis not present

## 2013-11-26 DIAGNOSIS — Z87891 Personal history of nicotine dependence: Secondary | ICD-10-CM | POA: Insufficient documentation

## 2013-11-26 DIAGNOSIS — Z8679 Personal history of other diseases of the circulatory system: Secondary | ICD-10-CM | POA: Diagnosis not present

## 2013-11-26 DIAGNOSIS — R42 Dizziness and giddiness: Secondary | ICD-10-CM | POA: Insufficient documentation

## 2013-11-26 DIAGNOSIS — Z79899 Other long term (current) drug therapy: Secondary | ICD-10-CM | POA: Insufficient documentation

## 2013-11-26 DIAGNOSIS — Z3202 Encounter for pregnancy test, result negative: Secondary | ICD-10-CM | POA: Diagnosis not present

## 2013-11-26 DIAGNOSIS — Z862 Personal history of diseases of the blood and blood-forming organs and certain disorders involving the immune mechanism: Secondary | ICD-10-CM | POA: Insufficient documentation

## 2013-11-26 DIAGNOSIS — H81391 Other peripheral vertigo, right ear: Secondary | ICD-10-CM

## 2013-11-26 DIAGNOSIS — Z8639 Personal history of other endocrine, nutritional and metabolic disease: Secondary | ICD-10-CM | POA: Insufficient documentation

## 2013-11-26 HISTORY — DX: Polycystic ovarian syndrome: E28.2

## 2013-11-26 LAB — URINALYSIS, ROUTINE W REFLEX MICROSCOPIC
BILIRUBIN URINE: NEGATIVE
Glucose, UA: NEGATIVE mg/dL
Ketones, ur: NEGATIVE mg/dL
Leukocytes, UA: NEGATIVE
Nitrite: NEGATIVE
PROTEIN: NEGATIVE mg/dL
Specific Gravity, Urine: 1.015 (ref 1.005–1.030)
UROBILINOGEN UA: 0.2 mg/dL (ref 0.0–1.0)
pH: 7 (ref 5.0–8.0)

## 2013-11-26 LAB — URINE MICROSCOPIC-ADD ON

## 2013-11-26 LAB — CBG MONITORING, ED: Glucose-Capillary: 116 mg/dL — ABNORMAL HIGH (ref 70–99)

## 2013-11-26 LAB — POC URINE PREG, ED: Preg Test, Ur: NEGATIVE

## 2013-11-26 MED ORDER — DIPHENHYDRAMINE HCL 50 MG/ML IJ SOLN
50.0000 mg | Freq: Once | INTRAMUSCULAR | Status: AC
  Administered 2013-11-26: 50 mg via INTRAVENOUS
  Filled 2013-11-26: qty 1

## 2013-11-26 MED ORDER — DIPHENHYDRAMINE HCL 50 MG/ML IJ SOLN
INTRAMUSCULAR | Status: AC
  Filled 2013-11-26: qty 1

## 2013-11-26 MED ORDER — SODIUM CHLORIDE 0.9 % IV SOLN
INTRAVENOUS | Status: DC
  Administered 2013-11-26: 125 mL via INTRAVENOUS

## 2013-11-26 MED ORDER — ONDANSETRON HCL 8 MG PO TABS: 8.0000 mg | ORAL_TABLET | Freq: Three times a day (TID) | ORAL | Status: DC | PRN

## 2013-11-26 MED ORDER — MECLIZINE HCL 50 MG PO TABS: 50.0000 mg | ORAL_TABLET | Freq: Three times a day (TID) | ORAL | Status: DC | PRN

## 2013-11-26 MED ORDER — ONDANSETRON HCL 4 MG/2ML IJ SOLN
4.0000 mg | Freq: Once | INTRAMUSCULAR | Status: AC
  Administered 2013-11-26: 4 mg via INTRAVENOUS
  Filled 2013-11-26: qty 2

## 2013-11-26 NOTE — ED Notes (Signed)
Patient complaining of dizziness and feeling hot for the past hour. Reports chest pain and nausea as well.

## 2013-11-26 NOTE — Discharge Instructions (Signed)
Epley Maneuver Self-Care WHAT IS THE EPLEY MANEUVER? The Epley maneuver is an exercise you can do to relieve symptoms of benign paroxysmal positional vertigo (BPPV). This condition is often just referred to as vertigo. BPPV is caused by the movement of tiny crystals (canaliths) inside your inner ear. The accumulation and movement of canaliths in your inner ear causes a sudden spinning sensation (vertigo) when you move your head to certain positions. Vertigo usually lasts about 30 seconds. BPPV usually occurs in just one ear. If you get vertigo when you lie on your left side, you probably have BPPV in your left ear. Your health care provider can tell you which ear is involved.  BPPV may be caused by a head injury. Many people older than 50 get BPPV for unknown reasons. If you have been diagnosed with BPPV, your health care provider may teach you how to do this maneuver. BPPV is not life threatening (benign) and usually goes away in time.  WHEN SHOULD I PERFORM THE EPLEY MANEUVER? You can do this maneuver at home whenever you have symptoms of vertigo. You may do the Epley maneuver up to 3 times a day until your symptoms of vertigo go away. HOW SHOULD I DO THE EPLEY MANEUVER? 1. Sit on the edge of a bed or table with your back straight. Your legs should be extended or hanging over the edge of the bed or table.  2. Turn your head halfway toward the affected ear.  3. Lie backward quickly with your head turned until you are lying flat on your back. You may want to position a pillow under your shoulders.  4. Hold this position for 30 seconds. You may experience an attack of vertigo. This is normal. Hold this position until the vertigo stops. 5. Then turn your head to the opposite direction until your unaffected ear is facing the floor.  6. Hold this position for 30 seconds. You may experience an attack of vertigo. This is normal. Hold this position until the vertigo stops. 7. Now turn your whole body to  the same side as your head. Hold for another 30 seconds.  8. You can then sit back up. ARE THERE RISKS TO THIS MANEUVER? In some cases, you may have other symptoms (such as changes in your vision, weakness, or numbness). If you have these symptoms, stop doing the maneuver and call your health care provider. Even if doing these maneuvers relieves your vertigo, you may still have dizziness. Dizziness is the sensation of light-headedness but without the sensation of movement. Even though the Epley maneuver may relieve your vertigo, it is possible that your symptoms will return within 5 years. WHAT SHOULD I DO AFTER THIS MANEUVER? After doing the Epley maneuver, you can return to your normal activities. Ask your doctor if there is anything you should do at home to prevent vertigo. This may include:  Sleeping with two or more pillows to keep your head elevated.  Not sleeping on the side of your affected ear.  Getting up slowly from bed.  Avoiding sudden movements during the day.  Avoiding extreme head movement, like looking up or bending over.  Wearing a cervical collar to prevent sudden head movements. WHAT SHOULD I DO IF MY SYMPTOMS GET WORSE? Call your health care provider if your vertigo gets worse. Call your provider right way if you have other symptoms, including:   Nausea.  Vomiting.  Headache.  Weakness.  Numbness.  Vision changes. Document Released: 04/16/2013 Document Reviewed: 04/16/2013 ExitCare   Patient Information 2015 [employer]. This information is not intended to replace advice given to you by your health care provider. Make sure you discuss any questions you have with your health care provider.  

## 2013-11-26 NOTE — ED Provider Notes (Addendum)
CSN: [id number]     Arrival date & time 11/26/13  0005 History  This chart was scribed for Wynetta Fines, MD by Lowella Petties, ED Scribe. The patient was seen in room APA15/APA15. Patient's care was started at 12:48 AM.   Chief Complaint  Patient presents with  . Dizziness   The history is provided by the patient. No language interpreter was used.  HPI Comments: Dana Mcpherson is a 23 y.o. female who presents to the Emergency Department complaining of dizziness, meaning constant room spinning, that began 2 or 3 hours ago. She has had similar symptoms in the past but not as bad. She reports that her room spinning sensation is exacerbated by standing up or opening her eyes. She reports associated nausea, vomiting, and blurred vision. Symptoms are severe enough that she must lie still with her eyes closed. She reports that her right ear feels "stuffy." She denies abdominal pain. She has been told in the past that she was hypoglycemic.   Past Medical History  Diagnosis Date  . Allergy   . Migraine headache   . Polycystic ovarian syndrome    Past Surgical History  Procedure Laterality Date  . Cholecystectomy     History reviewed. No pertinent family history. History  Substance Use Topics  . Smoking status: Former Research scientist (life sciences)  . Smokeless tobacco: Not on file  . Alcohol Use: Yes     Comment: occasional   OB History   Grav Para Term Preterm Abortions TAB SAB Ect Mult Living   1 1 1             Review of Systems  All other systems reviewed and are negative.  Allergies  Eggs or egg-derived products; Other; and Shellfish allergy  Home Medications   Prior to Admission medications   Medication Sig Start Date End Date Taking? Authorizing Provider  metFORMIN (GLUCOPHAGE) 500 MG tablet Take 1 tablet (500 mg total) by mouth 2 (two) times daily with a meal. 05/27/13   Nilda Simmer, NP  mupirocin cream (BACTROBAN) 2 % Apply 1 application topically 2 (two) times daily. 08/05/13   Mikey Kirschner, MD  valACYclovir (VALTREX) 1000 MG tablet Take 1 tablet (1,000 mg total) by mouth 3 (three) times daily. 08/05/13   Mikey Kirschner, MD   Triage Vitals: BP 134/84  Pulse 94  Temp(Src) 97.8 F (36.6 C) (Oral)  Resp 20  Ht 5\' 6"  (1.676 m)  Wt 220 lb (99.791 kg)  BMI 35.53 kg/m2  SpO2 99%  LMP 08/26/2013 Physical Exam  General: Well-developed, well-nourished female in no acute distress; appearance consistent with age of record HENT: normocephalic; atraumatic. TMs normal. Eyes: pupils equal, round and reactive to light; extraocular muscles intact. No nystagmus.  Neck: supple Heart: regular rate and rhythm; no murmurs, rubs or gallops Lungs: clear to auscultation bilaterally Abdomen: soft; nondistended; nontender; no masses or hepatosplenomegaly; bowel sounds present Extremities: No deformity; full range of motion; pulses normal Neurologic: Awake, alert and oriented; motor function intact in all extremities and symmetric; no facial droop Skin: Warm and dry Psychiatric: Normal mood and affect  ED Course  Procedures (including critical care time)  MDM   Nursing notes and vitals signs, including pulse oximetry, reviewed.  Summary of this visit's results, reviewed by myself:  Labs:  Results for orders placed during the hospital encounter of 11/26/13 (from the past 24 hour(s))  URINALYSIS, ROUTINE W REFLEX MICROSCOPIC     Status: Abnormal   Collection Time  11/26/13 12:39 AM      Result Value Ref Range   Color, Urine YELLOW  YELLOW   APPearance CLEAR  CLEAR   Specific Gravity, Urine 1.015  1.005 - 1.030   pH 7.0  5.0 - 8.0   Glucose, UA NEGATIVE  NEGATIVE mg/dL   Hgb urine dipstick TRACE (*) NEGATIVE   Bilirubin Urine NEGATIVE  NEGATIVE   Ketones, ur NEGATIVE  NEGATIVE mg/dL   Protein, ur NEGATIVE  NEGATIVE mg/dL   Urobilinogen, UA 0.2  0.0 - 1.0 mg/dL   Nitrite NEGATIVE  NEGATIVE   Leukocytes, UA NEGATIVE  NEGATIVE  URINE MICROSCOPIC-ADD ON     Status: Abnormal    Collection Time    11/26/13 12:39 AM      Result Value Ref Range   Squamous Epithelial / LPF MANY (*) RARE   WBC, UA 0-2  <3 WBC/hpf   RBC / HPF 0-2  <3 RBC/hpf   Bacteria, UA MANY (*) RARE  POC URINE PREG, ED     Status: None   Collection Time    11/26/13 12:47 AM      Result Value Ref Range   Preg Test, Ur NEGATIVE  NEGATIVE  CBG MONITORING, ED     Status: Abnormal   Collection Time    11/26/13  1:20 AM      Result Value Ref Range   Glucose-Capillary 116 (*) 70 - 99 mg/dL   1:35 AM Patient's vertigo improved after IV Benadryl.   I personally performed the services described in this documentation, which was scribed in my presence. The recorded information has been reviewed and is accurate.   Wynetta Fines, MD 11/26/13 0136  Wynetta Fines, MD 11/26/13 [phone]

## 2013-12-24 ENCOUNTER — Ambulatory Visit (INDEPENDENT_AMBULATORY_CARE_PROVIDER_SITE_OTHER): Payer: 59 | Admitting: Family Medicine

## 2013-12-24 ENCOUNTER — Encounter: Payer: Self-pay | Admitting: Family Medicine

## 2013-12-24 VITALS — BP 124/72 | Temp 98.3°F | Ht 66.5 in | Wt 214.0 lb

## 2013-12-24 DIAGNOSIS — R21 Rash and other nonspecific skin eruption: Secondary | ICD-10-CM

## 2013-12-24 MED ORDER — PREDNISONE 20 MG PO TABS: ORAL_TABLET | ORAL | Status: AC

## 2013-12-24 NOTE — Progress Notes (Signed)
   Subjective:    Patient ID: Dana Mcpherson, female    DOB: [birth year], 23 y.o.   MRN: [id number]  Rash This is a new problem. The current episode started 1 to 4 weeks ago. Pain location: legs, feet, neck, abdomen, back. The rash is characterized by itchiness. Past treatments include antihistamine (vinegar, fingernail polish, bleach, salt and vicks vapor rub). The treatment provided no relief.    Patient presents with discrete papules on feet. Extremely itchy. Also has developed them around her waistline. These are also very itchy.  Patient to get outdoors quite a bit. Was in heavy grass and recognizes potential for [employer].  Review of Systems  Skin: Positive for rash.   No fever no chills no cough no vomiting no diarrhea ROS otherwise    Objective:   Physical Exam  Alert hydration good. Lungs clear. Heart regular in rhythm. HEENT normal. Multiple discrete papules ankles waist region etc.      Assessment & Plan:  Impression probable extensive chigger bites. Discussed plan prednisone taper. Local measures discussed. WSL

## 2014-01-06 ENCOUNTER — Encounter: Payer: Self-pay | Admitting: Family Medicine

## 2014-01-06 ENCOUNTER — Ambulatory Visit (INDEPENDENT_AMBULATORY_CARE_PROVIDER_SITE_OTHER): Payer: 59 | Admitting: Family Medicine

## 2014-01-06 VITALS — BP 128/78 | Temp 98.0°F | Ht 66.5 in | Wt 218.2 lb

## 2014-01-06 DIAGNOSIS — J029 Acute pharyngitis, unspecified: Secondary | ICD-10-CM

## 2014-01-06 LAB — POCT RAPID STREP A (OFFICE): Rapid Strep A Screen: NEGATIVE

## 2014-01-06 MED ORDER — MAGIC MOUTHWASH: ORAL | Status: DC

## 2014-01-06 MED ORDER — AZITHROMYCIN 250 MG PO TABS: ORAL_TABLET | ORAL | Status: DC

## 2014-01-06 NOTE — Progress Notes (Signed)
   Subjective:    Patient ID: Dana Mcpherson, female    DOB: February 09, 1991, 23 y.o.   MRN: [id number]  Sore Throat  This is a new problem. The current episode started in the past 7 days. The problem has been unchanged. Neither side of throat is experiencing more pain than the other. There has been no fever. The pain is moderate. Associated symptoms include trouble swallowing. Associated symptoms comments: Blister in mouth. She has tried NSAIDs for the symptoms. The treatment provided moderate relief.   Patient states she has no other concerns at this time.   Tongue spot for six monthsw  Sore throat  Son ha s virus and cong, now on abx for sinus infxn,  Results for orders placed in visit on 01/06/14  POCT RAPID STREP A (OFFICE)      Result Value Ref Range   Rapid Strep A Screen Negative  Negative   Patient has had 10 patchy area on left common for a number of months now. Since at times. Review of Systems  HENT: Positive for trouble swallowing.    no headache no vomiting no rash no diarrhea ROS otherwise negative     Objective:   Physical Exam Alert mild malaise. Vitals reviewed. Lungs clear. Heart rare rhythm. Pharynx erythematous tender anterior nodes. Tongue reveals geographic tongue.       Assessment & Plan:  Throat rx'ed impression pharyngitis with lymphadenitis #2 GI graphic tongue with element of glossitis plan takes Magic mouthwash. Z-Pak. Local measures discussed. WSL

## 2014-01-07 LAB — STREP A DNA PROBE: GASP: NEGATIVE

## 2014-01-10 ENCOUNTER — Other Ambulatory Visit: Payer: Self-pay | Admitting: *Deleted

## 2014-01-10 ENCOUNTER — Telehealth: Payer: Self-pay | Admitting: Family Medicine

## 2014-01-10 MED ORDER — CEFPROZIL 500 MG PO TABS: 500.0000 mg | ORAL_TABLET | Freq: Two times a day (BID) | ORAL | Status: DC

## 2014-01-10 NOTE — Telephone Encounter (Signed)
cefz 500 bid ten d

## 2014-01-10 NOTE — Telephone Encounter (Signed)
Patient seen on 01/06/14 for acute pharyngitis.  She said that she was prescribed a z-pac.  Currently, she is still having sore throat, really bad headaches, and runny nose.   She is hoping we can try her on a different antibiotic.    [facility]

## 2014-01-10 NOTE — Telephone Encounter (Signed)
Med sent to pharm. Pt notified.  

## 2014-01-10 NOTE — Telephone Encounter (Signed)
Strep culture was done. Negative. Pt states she is feeling worse. Finished last pill on the zpak today. Sore throat, headache, and she thinks she is running fever because she is hot and cold.

## 2014-02-24 ENCOUNTER — Encounter: Payer: Self-pay | Admitting: Family Medicine

## 2014-03-03 ENCOUNTER — Ambulatory Visit (INDEPENDENT_AMBULATORY_CARE_PROVIDER_SITE_OTHER): Payer: 59 | Admitting: Nurse Practitioner

## 2014-03-03 ENCOUNTER — Encounter: Payer: Self-pay | Admitting: Nurse Practitioner

## 2014-03-03 VITALS — BP 140/78 | Ht 66.5 in | Wt 211.0 lb

## 2014-03-03 DIAGNOSIS — E161 Other hypoglycemia: Secondary | ICD-10-CM

## 2014-03-03 DIAGNOSIS — E282 Polycystic ovarian syndrome: Secondary | ICD-10-CM

## 2014-03-03 LAB — POCT GLYCOSYLATED HEMOGLOBIN (HGB A1C): HEMOGLOBIN A1C: 4.9

## 2014-03-03 MED ORDER — PHENTERMINE HCL 37.5 MG PO TABS: 37.5000 mg | ORAL_TABLET | Freq: Every day | ORAL | Status: DC

## 2014-03-03 NOTE — Patient Instructions (Addendum)
21 day fix [employer]

## 2014-03-06 ENCOUNTER — Encounter: Payer: Self-pay | Admitting: Nurse Practitioner

## 2014-03-06 NOTE — Progress Notes (Signed)
Subjective:  Presents for routine follow-up. Tried twice taking metformin but had significant side effects. Has begun a walking program 3 days per week. Doing well with her diet. Has cut out all sugary beverages. Continues to struggle with weight loss. Still has not had a menstrual cycle. Note she has PCOS.  Objective:   BP 140/78 mmHg  Ht 5' 6.5" (1.689 m)  Wt 211 lb (95.709 kg)  BMI 33.55 kg/m2 NAD. Alert, oriented. Lungs clear. Heart regular rate rhythm. Significant central obesity noted. Weight has been fairly stable this year.  Assessment:  Problem List Items Addressed This Visit      Endocrine   PCOS (polycystic ovarian syndrome) - Primary   Hyperinsulinemia   Relevant Orders      POCT glycosylated hemoglobin (Hb A1C) (Completed)     Other   Morbid obesity   Relevant Medications      phentermine (ADIPEX-P) 37.5 MG tablet     Plan:  Meds ordered this encounter  Medications  . DISCONTD: GLUMETZA 500 MG 24 hr tablet    Sig:   . DISCONTD: ondansetron (ZOFRAN) 8 MG tablet    Sig:   . phentermine (ADIPEX-P) 37.5 MG tablet    Sig: Take 1 tablet (37.5 mg total) by mouth daily before breakfast.    Dispense:  30 tablet    Refill:  0    Order Specific Question:  Supervising Provider    Answer:  Maggie Font  discussed importance of weight loss with PCOS and hyperinsulinemia. Patient understands her risk of developing type 2 diabetes. Encouraged regular meals and snacks. Avoid excessive simple carbs and skipping meals. Defers dietary consultation at this time. Reviewed potential adverse effects of phentermine, DC med and call if any problems. Set 2 goals today. First goal activity such as walking 30 minutes 5 days per week. Second goal limit simple carbs to 2 servings per day. Return in about 1 month (around 04/02/2014). Call back sooner if any problems.

## 2014-03-27 ENCOUNTER — Other Ambulatory Visit (HOSPITAL_COMMUNITY): Payer: Self-pay | Admitting: Family Medicine

## 2014-03-27 ENCOUNTER — Ambulatory Visit (HOSPITAL_COMMUNITY)
Admission: RE | Admit: 2014-03-27 | Discharge: 2014-03-27 | Disposition: A | Discharge status: Home / Self Care | Payer: 59 | Source: Ambulatory Visit | Attending: Family Medicine | Admitting: Family Medicine

## 2014-03-27 ENCOUNTER — Encounter (HOSPITAL_COMMUNITY): Payer: Self-pay | Admitting: Emergency Medicine

## 2014-03-27 ENCOUNTER — Other Ambulatory Visit (HOSPITAL_COMMUNITY): Payer: 59

## 2014-03-27 ENCOUNTER — Emergency Department (HOSPITAL_COMMUNITY)
Admission: EM | Admit: 2014-03-27 | Discharge: 2014-03-27 | Disposition: A | Discharge status: Home / Self Care | Payer: 59 | Attending: Emergency Medicine | Admitting: Emergency Medicine

## 2014-03-27 DIAGNOSIS — N8320 Unspecified ovarian cysts: Secondary | ICD-10-CM | POA: Insufficient documentation

## 2014-03-27 DIAGNOSIS — Z87891 Personal history of nicotine dependence: Secondary | ICD-10-CM | POA: Diagnosis not present

## 2014-03-27 DIAGNOSIS — Z8679 Personal history of other diseases of the circulatory system: Secondary | ICD-10-CM | POA: Insufficient documentation

## 2014-03-27 DIAGNOSIS — Z79899 Other long term (current) drug therapy: Secondary | ICD-10-CM | POA: Insufficient documentation

## 2014-03-27 DIAGNOSIS — R1031 Right lower quadrant pain: Secondary | ICD-10-CM

## 2014-03-27 DIAGNOSIS — R938 Abnormal findings on diagnostic imaging of other specified body structures: Secondary | ICD-10-CM | POA: Diagnosis not present

## 2014-03-27 DIAGNOSIS — Z791 Long term (current) use of non-steroidal anti-inflammatories (NSAID): Secondary | ICD-10-CM | POA: Diagnosis not present

## 2014-03-27 DIAGNOSIS — O26899 Other specified pregnancy related conditions, unspecified trimester: Secondary | ICD-10-CM

## 2014-03-27 DIAGNOSIS — Z3491 Encounter for supervision of normal pregnancy, unspecified, first trimester: Secondary | ICD-10-CM | POA: Diagnosis present

## 2014-03-27 DIAGNOSIS — Z8639 Personal history of other endocrine, nutritional and metabolic disease: Secondary | ICD-10-CM | POA: Insufficient documentation

## 2014-03-27 DIAGNOSIS — Z3A00 Weeks of gestation of pregnancy not specified: Secondary | ICD-10-CM | POA: Insufficient documentation

## 2014-03-27 DIAGNOSIS — R109 Unspecified abdominal pain: Secondary | ICD-10-CM

## 2014-03-27 DIAGNOSIS — O9989 Other specified diseases and conditions complicating pregnancy, childbirth and the puerperium: Secondary | ICD-10-CM | POA: Insufficient documentation

## 2014-03-27 LAB — URINE MICROSCOPIC-ADD ON

## 2014-03-27 LAB — URINALYSIS, ROUTINE W REFLEX MICROSCOPIC
Bilirubin Urine: NEGATIVE
Glucose, UA: NEGATIVE mg/dL
Ketones, ur: NEGATIVE mg/dL
Leukocytes, UA: NEGATIVE
NITRITE: NEGATIVE
PH: 5.5 (ref 5.0–8.0)
Protein, ur: NEGATIVE mg/dL
SPECIFIC GRAVITY, URINE: 1.02 (ref 1.005–1.030)
Urobilinogen, UA: 0.2 mg/dL (ref 0.0–1.0)

## 2014-03-27 LAB — CBC WITH DIFFERENTIAL/PLATELET
Basophils Absolute: 0 10*3/uL (ref 0.0–0.1)
Basophils Relative: 0 % (ref 0–1)
Eosinophils Absolute: 0.1 10*3/uL (ref 0.0–0.7)
Eosinophils Relative: 1 % (ref 0–5)
HCT: 37.7 % (ref 36.0–46.0)
HEMOGLOBIN: 13.4 g/dL (ref 12.0–15.0)
LYMPHS ABS: 1.9 10*3/uL (ref 0.7–4.0)
LYMPHS PCT: 17 % (ref 12–46)
MCH: 29.6 pg (ref 26.0–34.0)
MCHC: 35.5 g/dL (ref 30.0–36.0)
MCV: 83.2 fL (ref 78.0–100.0)
MONO ABS: 0.7 10*3/uL (ref 0.1–1.0)
MONOS PCT: 6 % (ref 3–12)
NEUTROS ABS: 8.6 10*3/uL — AB (ref 1.7–7.7)
NEUTROS PCT: 76 % (ref 43–77)
Platelets: 295 10*3/uL (ref 150–400)
RBC: 4.53 MIL/uL (ref 3.87–5.11)
RDW: 12.6 % (ref 11.5–15.5)
WBC: 11.4 10*3/uL — ABNORMAL HIGH (ref 4.0–10.5)

## 2014-03-27 LAB — WET PREP, GENITAL
Trich, Wet Prep: NONE SEEN
YEAST WET PREP: NONE SEEN

## 2014-03-27 LAB — HCG, QUANTITATIVE, PREGNANCY: hCG, Beta Chain, Quant, S: 88 m[IU]/mL — ABNORMAL HIGH (ref <5)

## 2014-03-27 LAB — ABO/RH: ABO/RH(D): O POS

## 2014-03-27 IMAGING — US US ABDOMEN LIMITED
1 series · 4 of 4 positions shown · non-contrast
Comparison: None.

CLINICAL DATA: Right lower quadrant pain

EXAM:
LIMITED ABDOMINAL ULTRASOUND
TECHNIQUE: Gray scale imaging of the right lower quadrant was performed to
evaluate for suspected appendicitis. Standard imaging planes and
graded compression technique were utilized.

[Series 1: us abdomen limited · 0.18mm/px · 4 acquisitions, 4 frames shown]
[im 1/4]
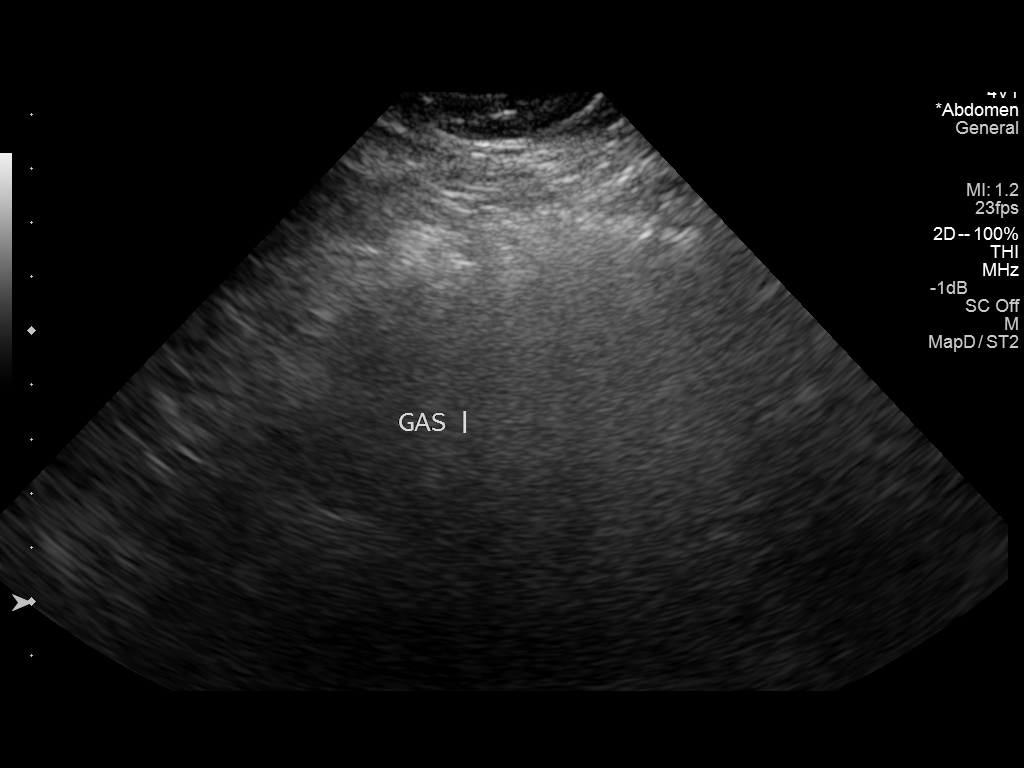
[im 2/4]
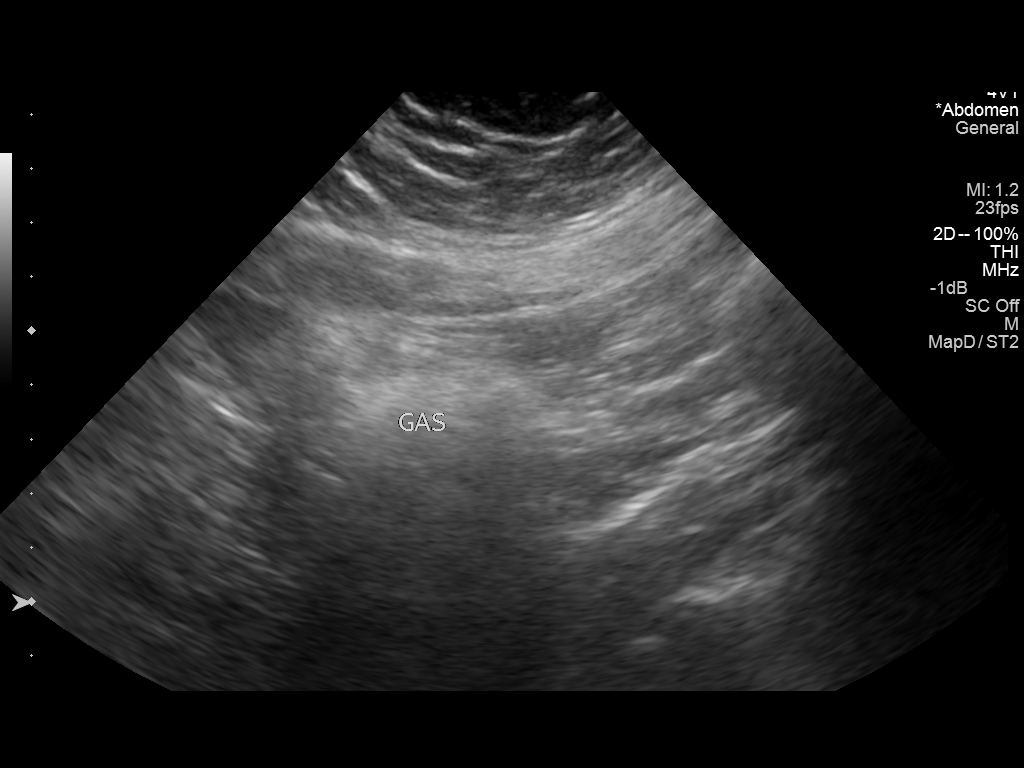
[im 3/4]
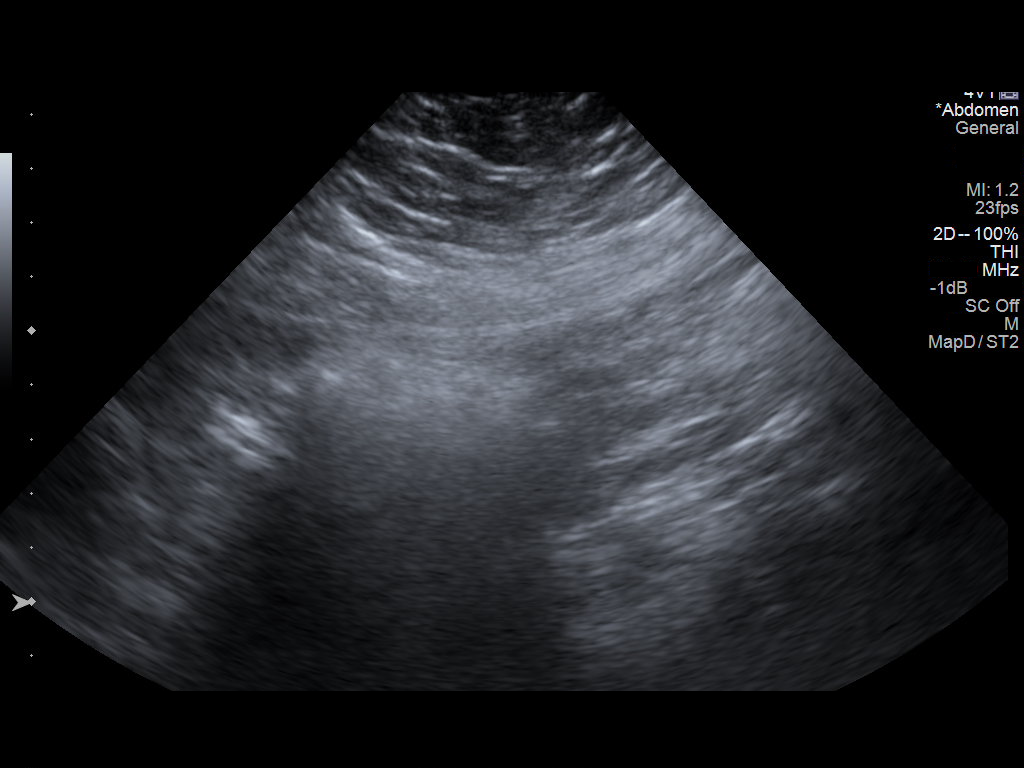
[im 4/4]
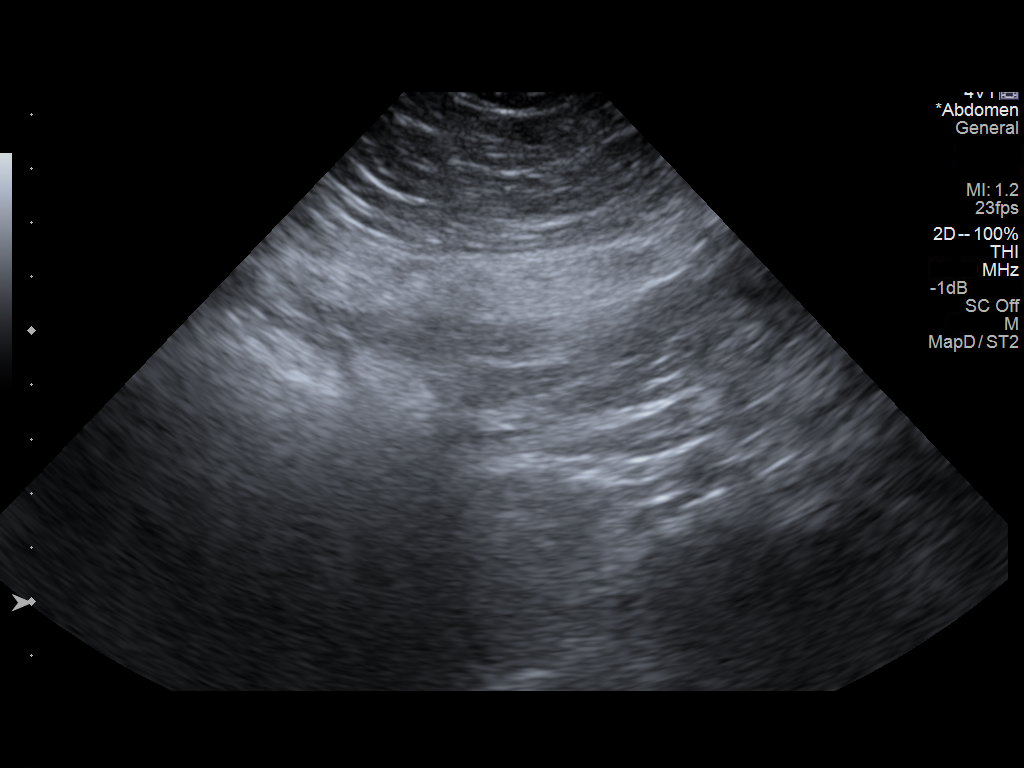

[4 of 4 positions shown; findings below may reference images not displayed]

FINDINGS: The appendix is not visualized.

Ancillary findings: None.

Factors affecting image quality: A large amount of bowel gas
obscures evaluation of underlying anatomy.
IMPRESSION: Large amount of bowel gas obscures detail. The appendix is not
visualized by ultrasound.

## 2014-03-27 IMAGING — US US OB COMP LESS 14 WK
1 series · 13 of 28 positions shown · non-contrast
Comparison: None.

CLINICAL DATA: Right lower quadrant abdominal pain, pregnancy.

EXAM:
OBSTETRIC <14 WK US AND TRANSVAGINAL OB US
TECHNIQUE: Both transabdominal and transvaginal ultrasound examinations were
performed for complete evaluation of the gestation as well as the
maternal uterus, adnexal regions, and pelvic cul-de-sac.
Transvaginal technique was performed to assess early pregnancy.

[Series 1: us ob comp less 14 wk · 0.24mm/px · 13 of 100 slices shown]
[im 4/100]
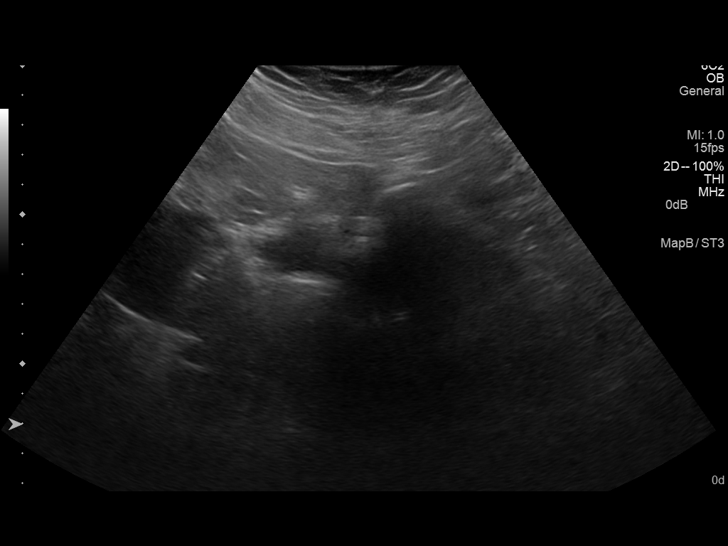
[im 12/100]
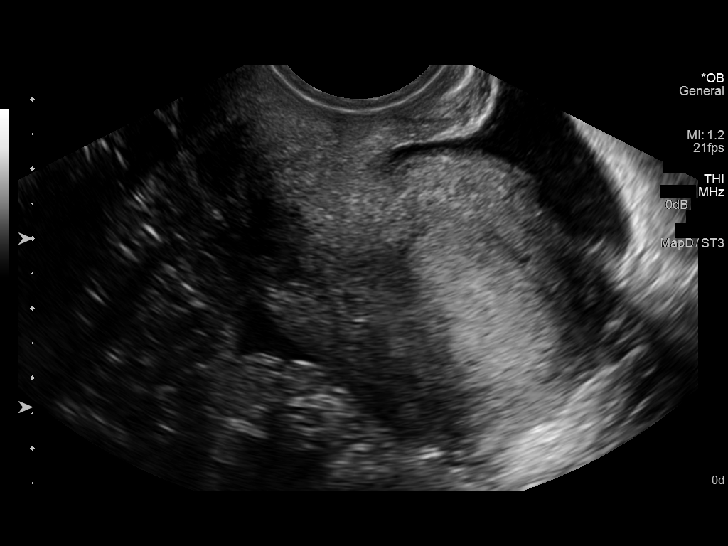
[im 19/100]
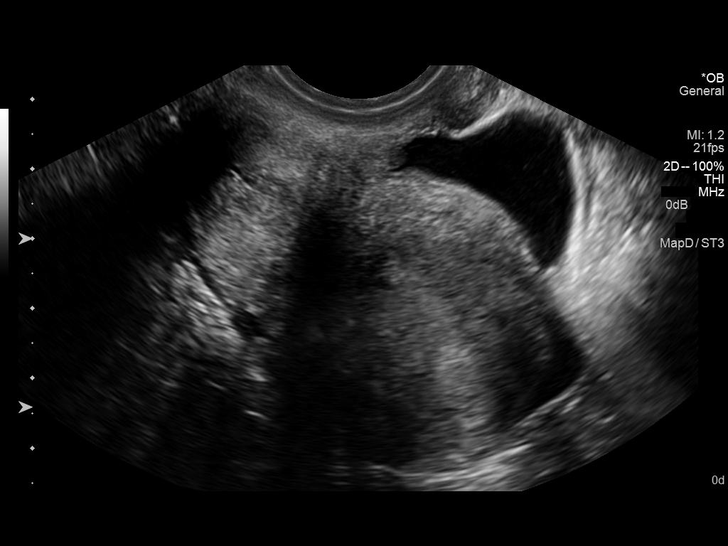
[im 26/100]
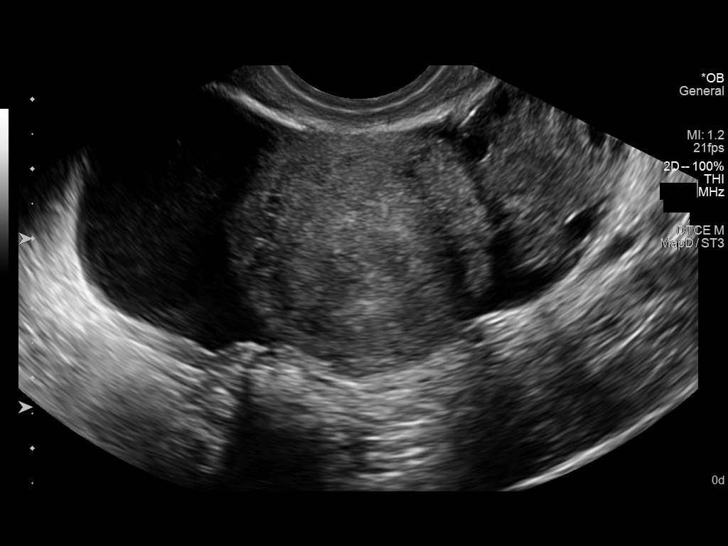
[im 34/100]
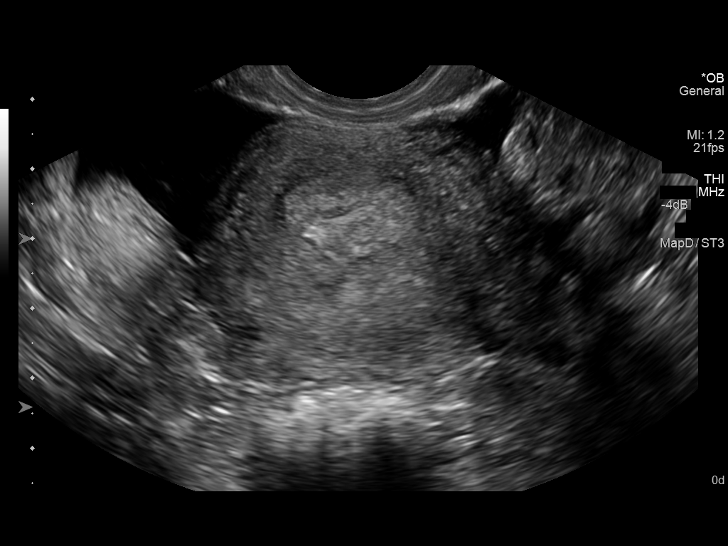
[im 41/100]
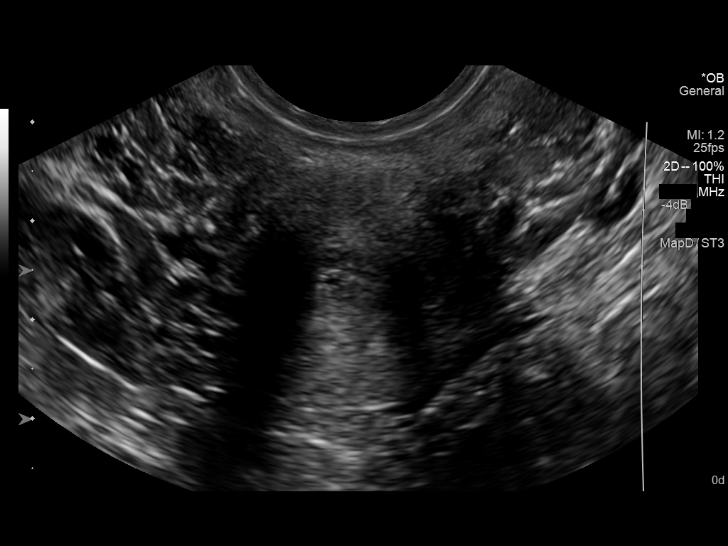
[im 52/100]
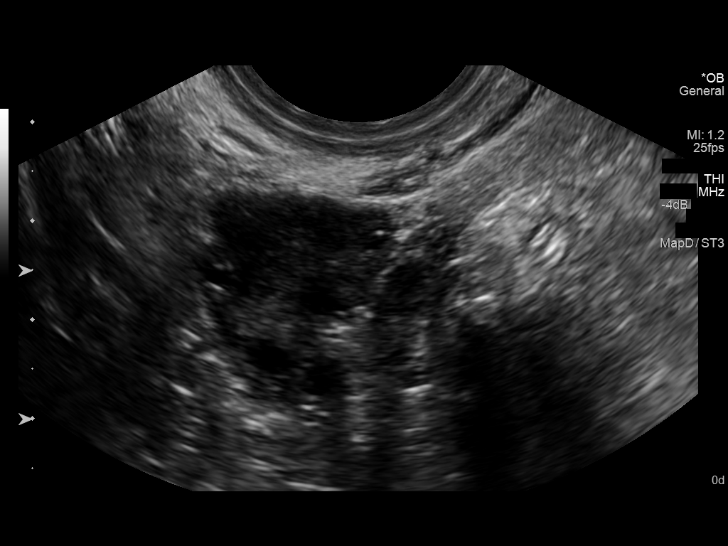
[im 59/100]
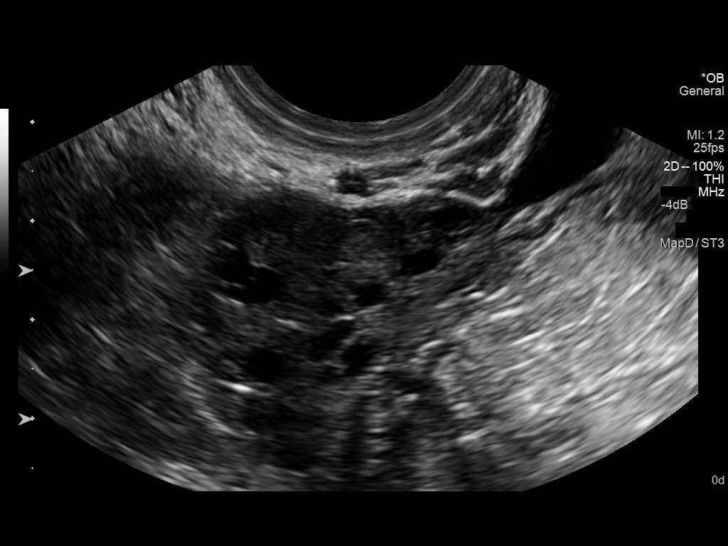
[im 67/100]
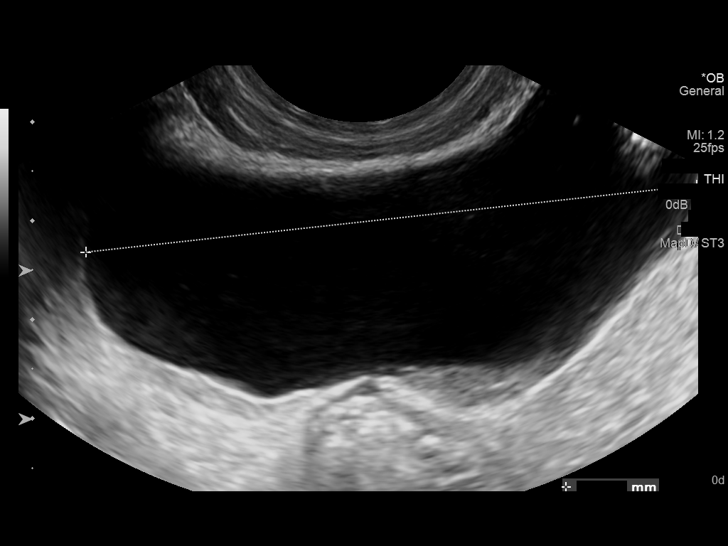
[im 74/100]
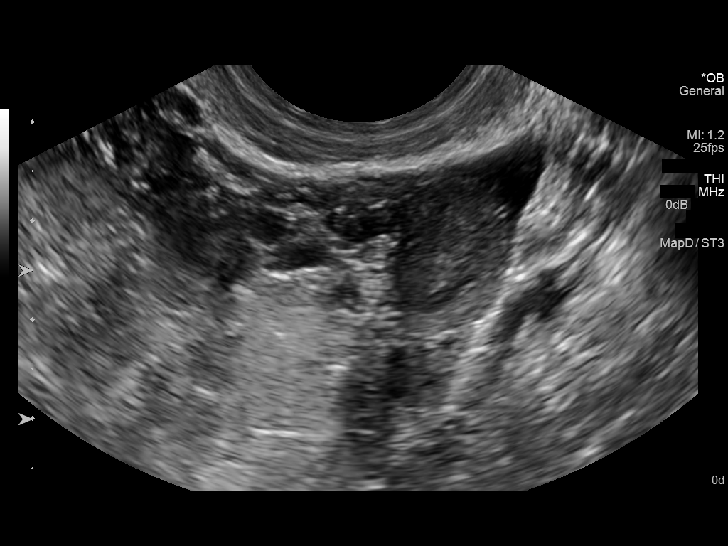
[im 81/100]
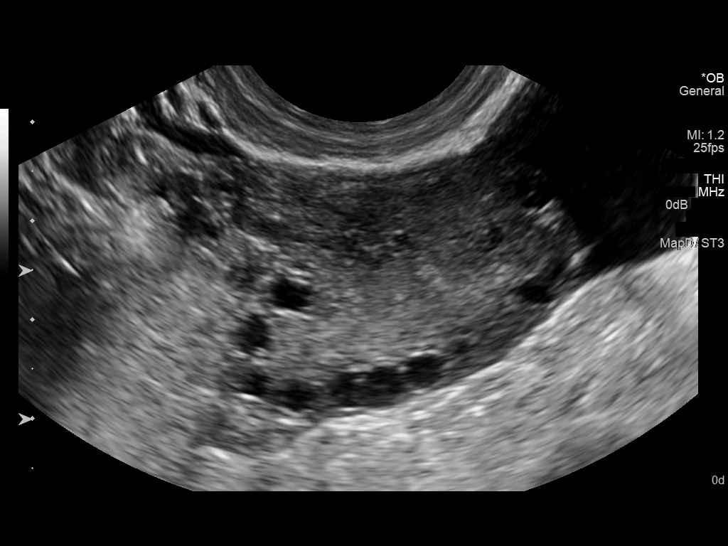
[im 89/100]
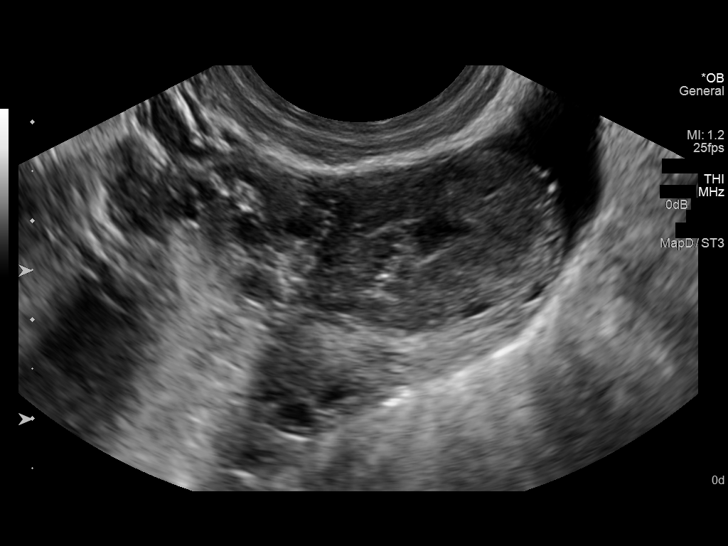
[im 96/100]
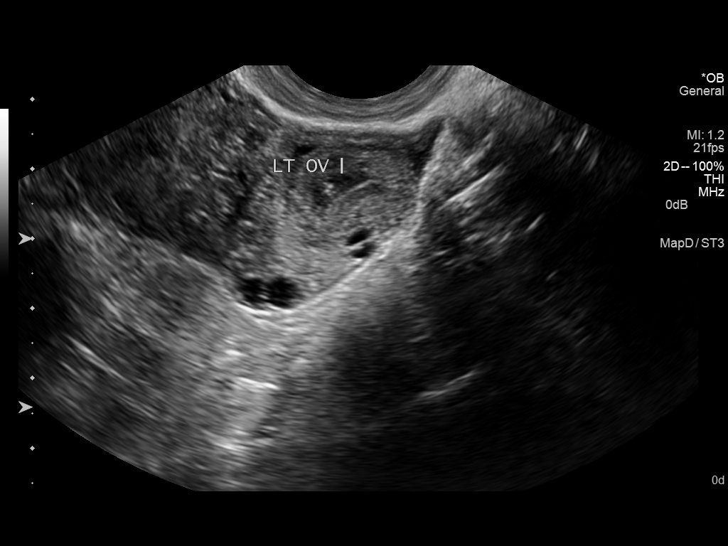

[13 of 28 positions shown; findings below may reference images not displayed]

FINDINGS: Intrauterine gestational sac: Not visualized.

Yolk sac:  Not visualized.

Embryo:  Not visualized.

Cardiac Activity: Not visualized.

Maternal uterus/adnexae: No subchorionic hemorrhage is noted. Right
ovary appears normal. Irregular cystic abnormality seen in left
ovary measuring 2.1 cm which may represent collapsed corpus luteum
cyst. Large amount of free fluid is noted in the pelvis. Endometrium
is significantly thickened at 14 mm.
IMPRESSION: No intrauterine fluid collection or gestational sac is noted.
Thickened endometrium is noted. In a pregnant patient, the
differential includes early intrauterine pregnancy, nonvisualized
ectopic pregnancy or missed abortion. Correlation with beta HCG
levels is recommended. Also noted is a large amount of free fluid in
the pelvis which may represent ruptured ovarian cyst.

## 2014-03-27 NOTE — ED Notes (Signed)
Pt was sent over by dr Hilma Favors to rule out ectopic pregnancy. Pt has pain on the left side.

## 2014-03-27 NOTE — Discharge Instructions (Signed)
Go to [facility] on Saturday for follow up blood work for your pregnancy hormone level. If you have severe pain, heavy bleeding, fever or other problems go to [facility] immediately.

## 2014-03-27 NOTE — ED Provider Notes (Signed)
CSN: [id number]     Arrival date & time 03/27/14  1936 History   First MD Initiated Contact with Patient 03/27/14 1942     Chief Complaint  Patient presents with  . Abdominal Pain     (Consider location/radiation/quality/duration/timing/severity/associated sxs/prior Treatment) HPI Dana Mcpherson is a 23 y.o. female who presents to the ED with RLQ abdominal pain. The pain started 2 weeks ago and comes and goes. She went to Dr. Delanna Ahmadi office today and had a positive pregnancy test.  She was sent from Dr. Delanna Ahmadi office to be evaluated for possible ectopic pregnancy.  She states that the pain has been on the right but when they were doing the transvaginal ultrasound that it hurt on the left too.   Past Medical History  Diagnosis Date  . Allergy   . Migraine headache   . Polycystic ovarian syndrome    Past Surgical History  Procedure Laterality Date  . Cholecystectomy     History reviewed. No pertinent family history. History  Substance Use Topics  . Smoking status: Former Research scientist (life sciences)  . Smokeless tobacco: Not on file  . Alcohol Use: Yes     Comment: occasional   OB History    Gravida Para Term Preterm AB TAB SAB Ectopic Multiple Living   1 1 1             Review of Systems Negative except as stated in HPI   Allergies  Eggs or egg-derived products; Other; and Shellfish allergy  Home Medications   Prior to Admission medications   Medication Sig Start Date End Date Taking? Authorizing Provider  ketorolac (TORADOL) 60 MG/2ML SOLN injection Inject 60 mg into the muscle once.   Yes Historical Provider, MD  naproxen sodium (ALEVE) 220 MG tablet Take 220 mg by mouth daily as needed (for pain).   Yes Historical Provider, MD  phentermine (ADIPEX-P) 37.5 MG tablet Take 1 tablet (37.5 mg total) by mouth daily before breakfast. Patient not taking: Reported on 03/27/2014 03/03/14   Nilda Simmer, NP   BP 130/69 mmHg  Pulse 104  Temp(Src) 97.8 F (36.6 C)  Resp 17  Ht 5' 6.5"  (1.689 m)  Wt 211 lb (95.709 kg)  BMI 33.55 kg/m2  SpO2 100%  LMP 02/13/2014 Physical Exam  Constitutional: She is oriented to person, place, and time. She appears well-developed and well-nourished.  HENT:  Head: Normocephalic and atraumatic.  Eyes: EOM are normal.  Neck: Neck supple.  Cardiovascular: Normal rate.   Pulmonary/Chest: Effort normal.  Abdominal: Soft. There is tenderness in the right lower quadrant and left lower quadrant. There is no rebound, no guarding and no CVA tenderness.  Tenderness is mild.   Genitourinary:  External genitalia without lesions. White discharge vaginal vault. Scant blood. No CMT, mild right adnexal tenderness, uterus without palpable enlargement.   Musculoskeletal: Normal range of motion.  Neurological: She is alert and oriented to person, place, and time. No cranial nerve deficit.  Skin: Skin is warm and dry.  Psychiatric: She has a normal mood and affect. Her behavior is normal.  Nursing note and vitals reviewed.   ED Course  Procedures (including critical care time) Labs Review  Results for orders placed or performed during the hospital encounter of 03/27/14 (from the past 24 hour(s))  Urinalysis, Routine w reflex microscopic     Status: Abnormal   Collection Time: 03/27/14  7:51 PM  Result Value Ref Range   Color, Urine YELLOW YELLOW   APPearance TURBID (A) CLEAR  Specific Gravity, Urine 1.020 1.005 - 1.030   pH 5.5 5.0 - 8.0   Glucose, UA NEGATIVE NEGATIVE mg/dL   Hgb urine dipstick SMALL (A) NEGATIVE   Bilirubin Urine NEGATIVE NEGATIVE   Ketones, ur NEGATIVE NEGATIVE mg/dL   Protein, ur NEGATIVE NEGATIVE mg/dL   Urobilinogen, UA 0.2 0.0 - 1.0 mg/dL   Nitrite NEGATIVE NEGATIVE   Leukocytes, UA NEGATIVE NEGATIVE  Urine microscopic-add on     Status: Abnormal   Collection Time: 03/27/14  7:51 PM  Result Value Ref Range   Squamous Epithelial / LPF MANY (A) RARE   WBC, UA 0-2 <3 WBC/hpf   RBC / HPF 0-2 <3 RBC/hpf   Bacteria, UA  FEW (A) RARE  CBC with Differential     Status: Abnormal   Collection Time: 03/27/14  8:03 PM  Result Value Ref Range   WBC 11.4 (H) 4.0 - 10.5 K/uL   RBC 4.53 3.87 - 5.11 MIL/uL   Hemoglobin 13.4 12.0 - 15.0 g/dL   HCT 37.7 36.0 - 46.0 %   MCV 83.2 78.0 - 100.0 fL   MCH 29.6 26.0 - 34.0 pg   MCHC 35.5 30.0 - 36.0 g/dL   RDW 12.6 11.5 - 15.5 %   Platelets 295 150 - 400 K/uL   Neutrophils Relative % 76 43 - 77 %   Neutro Abs 8.6 (H) 1.7 - 7.7 K/uL   Lymphocytes Relative 17 12 - 46 %   Lymphs Abs 1.9 0.7 - 4.0 K/uL   Monocytes Relative 6 3 - 12 %   Monocytes Absolute 0.7 0.1 - 1.0 K/uL   Eosinophils Relative 1 0 - 5 %   Eosinophils Absolute 0.1 0.0 - 0.7 K/uL   Basophils Relative 0 0 - 1 %   Basophils Absolute 0.0 0.0 - 0.1 K/uL  hCG, quantitative, pregnancy     Status: Abnormal   Collection Time: 03/27/14  8:03 PM  Result Value Ref Range   hCG, Beta Chain, Quant, S 88 (H) <5 mIU/mL  ABO/Rh     Status: None   Collection Time: 03/27/14  8:10 PM  Result Value Ref Range   ABO/RH(D) O POS   Wet prep, genital     Status: Abnormal   Collection Time: 03/27/14  8:16 PM  Result Value Ref Range   Yeast Wet Prep HPF POC NONE SEEN NONE SEEN   Trich, Wet Prep NONE SEEN NONE SEEN   Clue Cells Wet Prep HPF POC MODERATE (A) NONE SEEN   WBC, Wet Prep HPF POC FEW (A) NONE SEEN    Imaging Review US Ob Comp Less 14 Wks  03/27/2014   CLINICAL DATA:  Right lower quadrant abdominal pain, pregnancy.  EXAM: OBSTETRIC <14 WK [place] AND TRANSVAGINAL OB US  TECHNIQUE: Both transabdominal and transvaginal ultrasound examinations were performed for complete evaluation of the gestation as well as the maternal uterus, adnexal regions, and pelvic cul-de-sac. Transvaginal technique was performed to assess early pregnancy.  COMPARISON:  None.  FINDINGS: Intrauterine gestational sac: Not visualized.  Yolk sac:  Not visualized.  Embryo:  Not visualized.  Cardiac Activity: Not visualized.  Maternal uterus/adnexae: No  subchorionic hemorrhage is noted. Right ovary appears normal. Irregular cystic abnormality seen in left ovary measuring 2.1 cm which may represent collapsed corpus luteum cyst. Large amount of free fluid is noted in the pelvis. Endometrium is significantly thickened at 14 mm.  IMPRESSION: No intrauterine fluid collection or gestational sac is noted. Thickened endometrium is noted. In a pregnant  patient, the differential includes early intrauterine pregnancy, nonvisualized ectopic pregnancy or missed abortion. Correlation with beta HCG levels is recommended. Also noted is a large amount of free fluid in the pelvis which may represent ruptured ovarian cyst.   Electronically Signed   By: Sabino Dick M.D.   On: 03/27/2014 16:15   US Ob Transvaginal  03/27/2014   CLINICAL DATA:  Right lower quadrant abdominal pain, pregnancy.  EXAM: OBSTETRIC <14 WK [place] AND TRANSVAGINAL OB US  TECHNIQUE: Both transabdominal and transvaginal ultrasound examinations were performed for complete evaluation of the gestation as well as the maternal uterus, adnexal regions, and pelvic cul-de-sac. Transvaginal technique was performed to assess early pregnancy.  COMPARISON:  None.  FINDINGS: Intrauterine gestational sac: Not visualized.  Yolk sac:  Not visualized.  Embryo:  Not visualized.  Cardiac Activity: Not visualized.  Maternal uterus/adnexae: No subchorionic hemorrhage is noted. Right ovary appears normal. Irregular cystic abnormality seen in left ovary measuring 2.1 cm which may represent collapsed corpus luteum cyst. Large amount of free fluid is noted in the pelvis. Endometrium is significantly thickened at 14 mm.  IMPRESSION: No intrauterine fluid collection or gestational sac is noted. Thickened endometrium is noted. In a pregnant patient, the differential includes early intrauterine pregnancy, nonvisualized ectopic pregnancy or missed abortion. Correlation with beta HCG levels is recommended. Also noted is a large amount of free  fluid in the pelvis which may represent ruptured ovarian cyst.   Electronically Signed   By: Sabino Dick M.D.   On: 03/27/2014 16:15   US Abdomen Limited  03/27/2014   CLINICAL DATA:  Right lower quadrant pain  EXAM: LIMITED ABDOMINAL ULTRASOUND  TECHNIQUE: Pearline Cables scale imaging of the right lower quadrant was performed to evaluate for suspected appendicitis. Standard imaging planes and graded compression technique were utilized.  COMPARISON:  None.  FINDINGS: The appendix is not visualized.  Ancillary findings: None.  Factors affecting image quality: A large amount of bowel gas obscures evaluation of underlying anatomy.  IMPRESSION: Large amount of bowel gas obscures detail. The appendix is not visualized by ultrasound.   Electronically Signed   By: Ivar Drape M.D.   On: 03/27/2014 16:03    MDM  23 y.o. female with right side abdominal pain that comes and goes for the past 2 weeks. This is a desired pregnancy. Discussed with the patient the results of the ultrasound that shows a collapsed cyst on the right and that may have caused some pain and given the Bhcg is only 94 she will need to have close follow up. Discussed in detail ectopic precautions, pelvic rest and follow up in 48 hours to repeat the Bhcg. I discussed that the number should double in 2 days and since that will be Saturday she should go to [facility] for follow up.  I spoke with Fatima Blank, CNM in [facility] and patient will follow up there in 48 hours. Patient instructed to go there immediately for severe pain or other OB problems.      [place], NP 03/30/14 [place], MD 04/06/14 [phone]

## 2014-03-28 LAB — RPR

## 2014-03-28 LAB — HIV ANTIBODY (ROUTINE TESTING W REFLEX): HIV: NONREACTIVE

## 2014-03-29 ENCOUNTER — Encounter (HOSPITAL_COMMUNITY): Payer: Self-pay

## 2014-03-29 ENCOUNTER — Inpatient Hospital Stay (HOSPITAL_COMMUNITY)
Admission: AD | Admit: 2014-03-29 | Discharge: 2014-03-29 | Disposition: A | Discharge status: Home / Self Care | Payer: 59 | Source: Ambulatory Visit | Attending: Family Medicine | Admitting: Family Medicine

## 2014-03-29 DIAGNOSIS — Z3A01 Less than 8 weeks gestation of pregnancy: Secondary | ICD-10-CM | POA: Diagnosis not present

## 2014-03-29 DIAGNOSIS — R1031 Right lower quadrant pain: Secondary | ICD-10-CM | POA: Diagnosis not present

## 2014-03-29 DIAGNOSIS — O9989 Other specified diseases and conditions complicating pregnancy, childbirth and the puerperium: Secondary | ICD-10-CM | POA: Diagnosis not present

## 2014-03-29 DIAGNOSIS — O26899 Other specified pregnancy related conditions, unspecified trimester: Secondary | ICD-10-CM

## 2014-03-29 DIAGNOSIS — R109 Unspecified abdominal pain: Secondary | ICD-10-CM

## 2014-03-29 HISTORY — DX: Gestational (pregnancy-induced) hypertension without significant proteinuria, unspecified trimester: O13.9

## 2014-03-29 LAB — GC/CHLAMYDIA PROBE AMP
CT PROBE, AMP APTIMA: NEGATIVE
GC PROBE AMP APTIMA: NEGATIVE

## 2014-03-29 LAB — HCG, QUANTITATIVE, PREGNANCY: hCG, Beta Chain, Quant, S: 176 m[IU]/mL — ABNORMAL HIGH (ref <5)

## 2014-03-29 MED ORDER — PRENATAL PLUS 27-1 MG PO TABS: 1.0000 | ORAL_TABLET | Freq: Every day | ORAL | Status: DC

## 2014-03-29 NOTE — [facility] Note (Signed)
Pt states here for repeat BHCG. Denies pain or bleeding. Was told hcg level was low.

## 2014-03-29 NOTE — Discharge Instructions (Signed)
First Trimester of Pregnancy The first trimester of pregnancy is from week 1 until the end of week 12 (months 1 through 3). A week after a sperm fertilizes an egg, the egg will implant on the wall of the uterus. This embryo will begin to develop into a baby. Genes from you and your partner are forming the baby. The female genes determine whether the baby is a boy or a girl. At 6-8 weeks, the eyes and face are formed, and the heartbeat can be seen on ultrasound. At the end of 12 weeks, all the baby's organs are formed.  Now that you are pregnant, you will want to do everything you can to have a healthy baby. Two of the most important things are to get good prenatal care and to follow your health care provider's instructions. Prenatal care is all the medical care you receive before the baby's birth. This care will help prevent, find, and treat any problems during the pregnancy and childbirth. BODY CHANGES Your body goes through many changes during pregnancy. The changes vary from woman to woman.   You may gain or lose a couple of pounds at first.  You may feel sick to your stomach (nauseous) and throw up (vomit). If the vomiting is uncontrollable, call your health care provider.  You may tire easily.  You may develop headaches that can be relieved by medicines approved by your health care provider.  You may urinate more often. Painful urination may mean you have a bladder infection.  You may develop heartburn as a result of your pregnancy.  You may develop constipation because certain hormones are causing the muscles that push waste through your intestines to slow down.  You may develop hemorrhoids or swollen, bulging veins (varicose veins).  Your breasts may begin to grow larger and become tender. Your nipples may stick out more, and the tissue that surrounds them (areola) may become darker.  Your gums may bleed and may be sensitive to brushing and flossing.  Dark spots or blotches (chloasma,  mask of pregnancy) may develop on your face. This will likely fade after the baby is born.  Your menstrual periods will stop.  You may have a loss of appetite.  You may develop cravings for certain kinds of food.  You may have changes in your emotions from day to day, such as being excited to be pregnant or being concerned that something may go wrong with the pregnancy and baby.  You may have more vivid and strange dreams.  You may have changes in your hair. These can include thickening of your hair, rapid growth, and changes in texture. Some women also have hair loss during or after pregnancy, or hair that feels dry or thin. Your hair will most likely return to normal after your baby is born. WHAT TO EXPECT AT YOUR PRENATAL VISITS During a routine prenatal visit:  You will be weighed to make sure you and the baby are growing normally.  Your blood pressure will be taken.  Your abdomen will be measured to track your baby's growth.  The fetal heartbeat will be listened to starting around week 10 or 12 of your pregnancy.  Test results from any previous visits will be discussed. Your health care provider may ask you:  How you are feeling.  If you are feeling the baby move.  If you have had any abnormal symptoms, such as leaking fluid, bleeding, severe headaches, or abdominal cramping.  If you have any questions. Other tests  that may be performed during your first trimester include: °· Blood tests to find your blood type and to check for the presence of any previous infections. They will also be used to check for low iron levels (anemia) and Rh antibodies. Later in the pregnancy, blood tests for diabetes will be done along with other tests if problems develop. °· Urine tests to check for infections, diabetes, or protein in the urine. °· An ultrasound to confirm the proper growth and development of the baby. °· An amniocentesis to check for possible genetic problems. °· Fetal screens for  spina bifida and Down syndrome. °· You may need other tests to make sure you and the baby are doing well. °HOME CARE INSTRUCTIONS  °Medicines °· Follow your health care provider's instructions regarding medicine use. Specific medicines may be either safe or unsafe to take during pregnancy. °· Take your prenatal vitamins as directed. °· If you develop constipation, try taking a stool softener if your health care provider approves. °Diet °· Eat regular, well-balanced meals. Choose a variety of foods, such as meat or vegetable-based protein, fish, milk and low-fat dairy products, vegetables, fruits, and whole grain breads and cereals. Your health care provider will help you determine the amount of weight gain that is right for you. °· Avoid raw meat and uncooked cheese. These carry germs that can cause birth defects in the baby. °· Eating four or five small meals rather than three large meals a day may help relieve nausea and vomiting. If you start to feel nauseous, eating a few soda crackers can be helpful. Drinking liquids between meals instead of during meals also seems to help nausea and vomiting. °· If you develop constipation, eat more high-fiber foods, such as fresh vegetables or fruit and whole grains. Drink enough fluids to keep your urine clear or pale yellow. °Activity and Exercise °· Exercise only as directed by your health care provider. Exercising will help you: °¨ Control your weight. °¨ Stay in shape. °¨ Be prepared for labor and delivery. °· Experiencing pain or cramping in the lower abdomen or low back is a good sign that you should stop exercising. Check with your health care provider before continuing normal exercises. °· Try to avoid standing for long periods of time. Move your legs often if you must stand in one place for a long time. °· Avoid heavy lifting. °· Wear low-heeled shoes, and practice good posture. °· You may continue to have sex unless your health care provider directs you  otherwise. °Relief of Pain or Discomfort °· Wear a good support bra for breast tenderness.   °· Take warm sitz baths to soothe any pain or discomfort caused by hemorrhoids. Use hemorrhoid cream if your health care provider approves.   °· Rest with your legs elevated if you have leg cramps or low back pain. °· If you develop varicose veins in your legs, wear support hose. Elevate your feet for 15 minutes, 3-4 times a day. Limit salt in your diet. °Prenatal Care °· Schedule your prenatal visits by the twelfth week of pregnancy. They are usually scheduled monthly at first, then more often in the last 2 months before delivery. °· Write down your questions. Take them to your prenatal visits. °· Keep all your prenatal visits as directed by your health care provider. °Safety °· Wear your seat belt at all times when driving. °· Make a list of emergency phone numbers, including numbers for family, friends, the hospital, and police and fire departments. °General Tips °·   Ask your health care provider for a referral to a local prenatal education class. Begin classes no later than at the beginning of month 6 of your pregnancy.  Ask for help if you have counseling or nutritional needs during pregnancy. Your health care provider can offer advice or refer you to specialists for help with various needs.  Do not use hot tubs, steam rooms, or saunas.  Do not douche or use tampons or scented sanitary pads.  Do not cross your legs for long periods of time.  Avoid cat litter boxes and soil used by cats. These carry germs that can cause birth defects in the baby and possibly loss of the fetus by miscarriage or stillbirth.  Avoid all smoking, herbs, alcohol, and medicines not prescribed by your health care provider. Chemicals in these affect the formation and growth of the baby.  Schedule a dentist appointment. At home, brush your teeth with a soft toothbrush and be gentle when you floss. SEEK MEDICAL CARE IF:   You have  dizziness.  You have mild pelvic cramps, pelvic pressure, or nagging pain in the abdominal area.  You have persistent nausea, vomiting, or diarrhea.  You have a bad smelling vaginal discharge.  You have pain with urination.  You notice increased swelling in your face, hands, legs, or ankles. SEEK IMMEDIATE MEDICAL CARE IF:   You have a fever.  You are leaking fluid from your vagina.  You have spotting or bleeding from your vagina.  You have severe abdominal cramping or pain.  You have rapid weight gain or loss.  You vomit blood or material that looks like coffee grounds.  You are exposed to [place] measles and have never had them.  You are exposed to fifth disease or chickenpox.  You develop a severe headache.  You have shortness of breath.  You have any kind of trauma, such as from a fall or a car accident. Document Released: 04/05/2001 Document Revised: 08/26/2013 Document Reviewed: 02/19/2013 [facility] Patient Information 2015 [place], [place]. This information is not intended to replace advice given to you by your health care provider. Make sure you discuss any questions you have with your health care provider.   Abdominal Pain During Pregnancy Abdominal pain is common in pregnancy. Most of the time, it does not cause harm. There are many causes of abdominal pain. Some causes are more serious than others. Some of the causes of abdominal pain in pregnancy are easily diagnosed. Occasionally, the diagnosis takes time to understand. Other times, the cause is not determined. Abdominal pain can be a sign that something is very wrong with the pregnancy, or the pain may have nothing to do with the pregnancy at all. For this reason, always tell your health care provider if you have any abdominal discomfort. HOME CARE INSTRUCTIONS  Monitor your abdominal pain for any changes. The following actions may help to alleviate any discomfort you are experiencing:  Do not have sexual  intercourse or put anything in your vagina until your symptoms go away completely.  Get plenty of rest until your pain improves.  Drink clear fluids if you feel nauseous. Avoid solid food as long as you are uncomfortable or nauseous.  Only take over-the-counter or prescription medicine as directed by your health care provider.  Keep all follow-up appointments with your health care provider. SEEK IMMEDIATE MEDICAL CARE IF:  You are bleeding, leaking fluid, or passing tissue from the vagina.  You have increasing pain or cramping.  You have persistent vomiting.  You have painful or bloody urination.  You have a fever.  You notice a decrease in your baby's movements.  You have extreme weakness or feel faint.  You have shortness of breath, with or without abdominal pain.  You develop a severe headache with abdominal pain.  You have abnormal vaginal discharge with abdominal pain.  You have persistent diarrhea.  You have abdominal pain that continues even after rest, or gets worse. MAKE SURE YOU:   Understand these instructions.  Will watch your condition.  Will get help right away if you are not doing well or get worse. Document Released: 04/11/2005 Document Revised: 01/30/2013 Document Reviewed: 11/08/2012 [facility] Patient Information 2015 [place], [place]. This information is not intended to replace advice given to you by your health care provider. Make sure you discuss any questions you have with your health care provider.

## 2014-03-29 NOTE — [facility] Provider Note (Signed)
History   Chief Complaint:  Repeat BHCG    Dana Mcpherson is  23 y.o. [id number] Patient's last menstrual period was 02/13/2014.Marland Kitchen Patient is here for follow up of quantitative HCG and ongoing surveillance of pregnancy status.   She is [redacted]w[redacted]d weeks gestation  by LMP.    Since her last visit, the patient is without new complaint.   The patient reports bleeding as  none now.  Denies abd pain.  General ROS:  negative  Her previous Quantitative HCG values are:  03/27/14:  88  Physical Exam   Blood pressure 140/68, pulse 96, temperature 97.8 F (36.6 C), temperature source Oral, resp. rate 16, height 5' 6.5" (1.689 m), weight 93.214 kg (205 lb 8 oz), last menstrual period 02/13/2014.  Focused Gynecological Exam: examination not indicated  Labs: Results for orders placed or performed during the hospital encounter of 03/29/14 (from the past 24 hour(s))  hCG, quantitative, pregnancy   Collection Time: 03/29/14 11:58 AM  Result Value Ref Range   hCG, Beta Chain, Quant, S 176 (H) <5 mIU/mL    Ultrasound Studies:   US Ob Comp Less 14 Wks  03/27/2014   CLINICAL DATA:  Right lower quadrant abdominal pain, pregnancy.  EXAM: OBSTETRIC <14 WK [place] AND TRANSVAGINAL OB US  TECHNIQUE: Both transabdominal and transvaginal ultrasound examinations were performed for complete evaluation of the gestation as well as the maternal uterus, adnexal regions, and pelvic cul-de-sac. Transvaginal technique was performed to assess early pregnancy.  COMPARISON:  None.  FINDINGS: Intrauterine gestational sac: Not visualized.  Yolk sac:  Not visualized.  Embryo:  Not visualized.  Cardiac Activity: Not visualized.  Maternal uterus/adnexae: No subchorionic hemorrhage is noted. Right ovary appears normal. Irregular cystic abnormality seen in left ovary measuring 2.1 cm which may represent collapsed corpus luteum cyst. Large amount of free fluid is noted in the pelvis. Endometrium is significantly thickened at 14 mm.  IMPRESSION: No  intrauterine fluid collection or gestational sac is noted. Thickened endometrium is noted. In a pregnant patient, the differential includes early intrauterine pregnancy, nonvisualized ectopic pregnancy or missed abortion. Correlation with beta HCG levels is recommended. Also noted is a large amount of free fluid in the pelvis which may represent ruptured ovarian cyst.   Electronically Signed   By: Sabino Dick M.D.   On: 03/27/2014 16:15   US Ob Transvaginal  03/27/2014   CLINICAL DATA:  Right lower quadrant abdominal pain, pregnancy.  EXAM: OBSTETRIC <14 WK [place] AND TRANSVAGINAL OB US  TECHNIQUE: Both transabdominal and transvaginal ultrasound examinations were performed for complete evaluation of the gestation as well as the maternal uterus, adnexal regions, and pelvic cul-de-sac. Transvaginal technique was performed to assess early pregnancy.  COMPARISON:  None.  FINDINGS: Intrauterine gestational sac: Not visualized.  Yolk sac:  Not visualized.  Embryo:  Not visualized.  Cardiac Activity: Not visualized.  Maternal uterus/adnexae: No subchorionic hemorrhage is noted. Right ovary appears normal. Irregular cystic abnormality seen in left ovary measuring 2.1 cm which may represent collapsed corpus luteum cyst. Large amount of free fluid is noted in the pelvis. Endometrium is significantly thickened at 14 mm.  IMPRESSION: No intrauterine fluid collection or gestational sac is noted. Thickened endometrium is noted. In a pregnant patient, the differential includes early intrauterine pregnancy, nonvisualized ectopic pregnancy or missed abortion. Correlation with beta HCG levels is recommended. Also noted is a large amount of free fluid in the pelvis which may represent ruptured ovarian cyst.   Electronically Signed   By: Jeneen Rinks  Green M.D.   On: 03/27/2014 16:15   US Abdomen Limited  03/27/2014   CLINICAL DATA:  Right lower quadrant pain  EXAM: LIMITED ABDOMINAL ULTRASOUND  TECHNIQUE: Pearline Cables scale imaging of the right  lower quadrant was performed to evaluate for suspected appendicitis. Standard imaging planes and graded compression technique were utilized.  COMPARISON:  None.  FINDINGS: The appendix is not visualized.  Ancillary findings: None.  Factors affecting image quality: A large amount of bowel gas obscures evaluation of underlying anatomy.  IMPRESSION: Large amount of bowel gas obscures detail. The appendix is not visualized by ultrasound.   Electronically Signed   By: Ivar Drape M.D.   On: 03/27/2014 16:03    Assessment: [redacted]w[redacted]d weeks gestation w/ appropriate rise in Quant  Plan: Discharge home in stable condition. Pregnancy verification letter given. Follow-up ultrasound in 2 weeks, probably at [facility].   Medication List    STOP taking these medications        ALEVE 220 MG tablet  Generic drug:  naproxen sodium     ketorolac 60 MG/2ML Soln injection  Commonly known as:  TORADOL     phentermine 37.5 MG tablet  Commonly known as:  ADIPEX-P      TAKE these medications        prenatal vitamin w/FE, FA 27-1 MG Tabs tablet  Take 1 tablet by mouth daily at 12 noon.       [place], [place] 03/29/2014, 12:14 PM

## 2014-04-01 ENCOUNTER — Ambulatory Visit (INDEPENDENT_AMBULATORY_CARE_PROVIDER_SITE_OTHER): Payer: 59 | Admitting: Advanced Practice Midwife

## 2014-04-01 ENCOUNTER — Encounter: Payer: Self-pay | Admitting: Advanced Practice Midwife

## 2014-04-01 ENCOUNTER — Telehealth: Payer: Self-pay | Admitting: Obstetrics & Gynecology

## 2014-04-01 ENCOUNTER — Ambulatory Visit: Payer: 59 | Admitting: Nurse Practitioner

## 2014-04-01 VITALS — BP 128/80 | Temp 98.1°F | Ht 66.5 in | Wt 203.5 lb

## 2014-04-01 DIAGNOSIS — J069 Acute upper respiratory infection, unspecified: Secondary | ICD-10-CM

## 2014-04-01 MED ORDER — AZITHROMYCIN 250 MG PO TABS: 250.0000 mg | ORAL_TABLET | Freq: Every day | ORAL | Status: DC

## 2014-04-01 NOTE — Telephone Encounter (Signed)
Pt c/o sore throat, ear aches, been running a fever, strep test done at PCP, resulted negative. Pt states her PCP did not feel comfortable prescribing meds. Call transferred to front staff for as soonest available appt.

## 2014-04-01 NOTE — Progress Notes (Signed)
[facility] Visit  Patient name: Dana Mcpherson MRN [id number]  Date of birth: [birth year]  CC & HPI:  Dana N Schiraldi is a 23 y.o. Caucasian female presenting today for: c/o sore throat, ear aches, been running a fever (100.0)  for 7 days.Strep test done at PCP, resulted negative. Pt states her PCP did not feel comfortable prescribing meds.    Pertinent History Reviewed:  Medical & Surgical Hx:   Past Medical History  Diagnosis Date  . Allergy   . Migraine headache   . Polycystic ovarian syndrome   . Pregnancy induced hypertension    Past Surgical History  Procedure Laterality Date  . Cholecystectomy     Medications: Reviewed & Updated - see associated section Social History: Reviewed -  reports that she has quit smoking. She has never used smokeless tobacco.  Objective Findings:  Vitals: BP 128/80 mmHg  Temp(Src) 98.1 F (36.7 C)  Ht 5' 6.5" (1.689 m)  Wt 203 lb 8 oz (92.307 kg)  BMI 32.36 kg/m2  LMP 02/13/2014  Physical Examination: General appearance - alert, well appearing, and in no distress Mental status - alert, oriented to person, place, and time Ears - right ear normal, left ear normal Mouth - erythematous Lymphatics - no palpable lymphadenopathy Chest - clear to auscultation, no wheezes, rales or rhonchi, symmetric air entry  No results found for this or any previous visit (from the past 24 hour(s)).   Assessment & Plan:  A:   URI P:  Zpack, Saline nose spray and gargle with salt water.  Diclegis samples for morning sickness   F/U 12/21 as scheduled for [place]   CRESENZO-DISHMAN,Tayna [place] CNM 04/01/2014 3:42 PM

## 2014-04-04 ENCOUNTER — Ambulatory Visit: Payer: 59 | Admitting: Nurse Practitioner

## 2014-04-06 ENCOUNTER — Encounter (HOSPITAL_COMMUNITY): Payer: Self-pay | Admitting: *Deleted

## 2014-04-06 ENCOUNTER — Inpatient Hospital Stay (HOSPITAL_COMMUNITY)
Admission: AD | Admit: 2014-04-06 | Discharge: 2014-04-06 | Disposition: A | Discharge status: Home / Self Care | Payer: 59 | Source: Ambulatory Visit | Attending: Obstetrics and Gynecology | Admitting: Obstetrics and Gynecology

## 2014-04-06 ENCOUNTER — Inpatient Hospital Stay (HOSPITAL_COMMUNITY): Payer: 59

## 2014-04-06 DIAGNOSIS — R1031 Right lower quadrant pain: Secondary | ICD-10-CM | POA: Insufficient documentation

## 2014-04-06 DIAGNOSIS — Z87891 Personal history of nicotine dependence: Secondary | ICD-10-CM | POA: Diagnosis not present

## 2014-04-06 DIAGNOSIS — O9989 Other specified diseases and conditions complicating pregnancy, childbirth and the puerperium: Secondary | ICD-10-CM

## 2014-04-06 DIAGNOSIS — Z3A01 Less than 8 weeks gestation of pregnancy: Secondary | ICD-10-CM | POA: Insufficient documentation

## 2014-04-06 DIAGNOSIS — R109 Unspecified abdominal pain: Secondary | ICD-10-CM

## 2014-04-06 DIAGNOSIS — O26899 Other specified pregnancy related conditions, unspecified trimester: Secondary | ICD-10-CM

## 2014-04-06 LAB — URINE MICROSCOPIC-ADD ON

## 2014-04-06 LAB — URINALYSIS, ROUTINE W REFLEX MICROSCOPIC
Bilirubin Urine: NEGATIVE
Glucose, UA: NEGATIVE mg/dL
Hgb urine dipstick: NEGATIVE
Ketones, ur: NEGATIVE mg/dL
Nitrite: NEGATIVE
Protein, ur: NEGATIVE mg/dL
Specific Gravity, Urine: <1.005 — ABNORMAL LOW (ref 1.005–1.030)
Urobilinogen, UA: 0.2 mg/dL (ref 0.0–1.0)
pH: 6 (ref 5.0–8.0)

## 2014-04-06 LAB — HCG, QUANTITATIVE, PREGNANCY: hCG, Beta Chain, Quant, S: 4197 m[IU]/mL — ABNORMAL HIGH (ref <5)

## 2014-04-06 IMAGING — US US OB TRANSVAGINAL
1 series · 14 of 25 positions shown · non-contrast
Comparison: [DATE].

CLINICAL DATA: Abdominal cramping, pregnancy.

EXAM:
TRANSVAGINAL OB ULTRASOUND
TECHNIQUE: Transvaginal ultrasound was performed for complete evaluation of the
gestation as well as the maternal uterus, adnexal regions, and
pelvic cul-de-sac.

[Series 1: us ob transvaginal · 25 acquisitions, 14 frames shown]
[im 1/25]
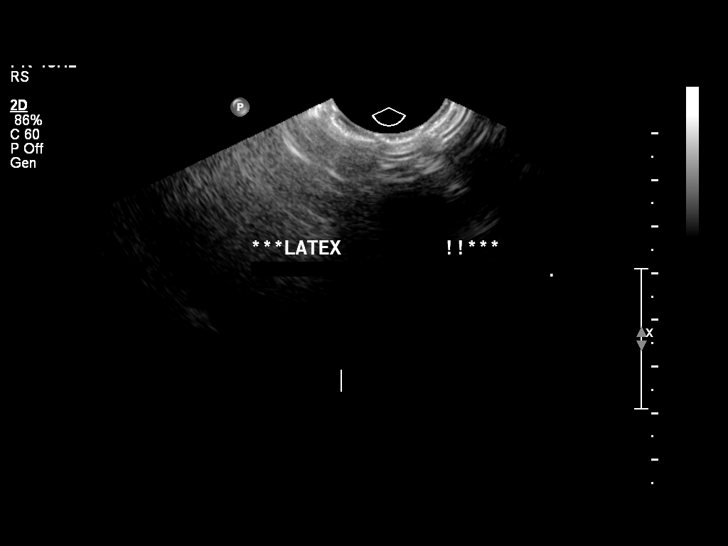
[im 3/25]
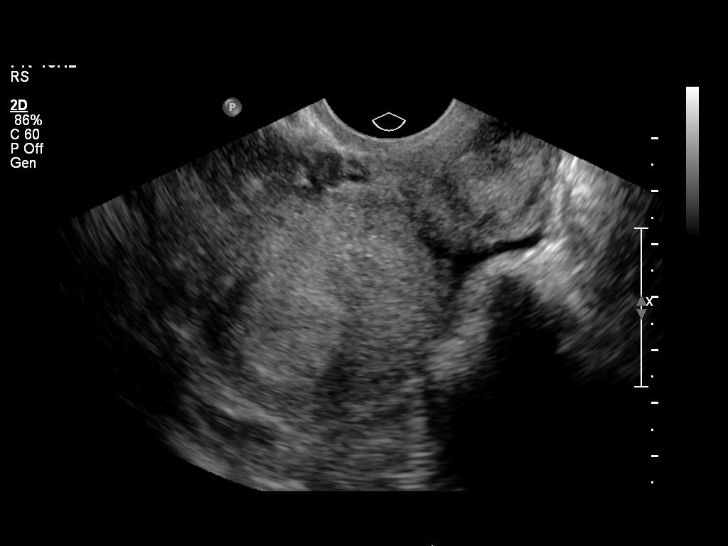
[im 5/25]
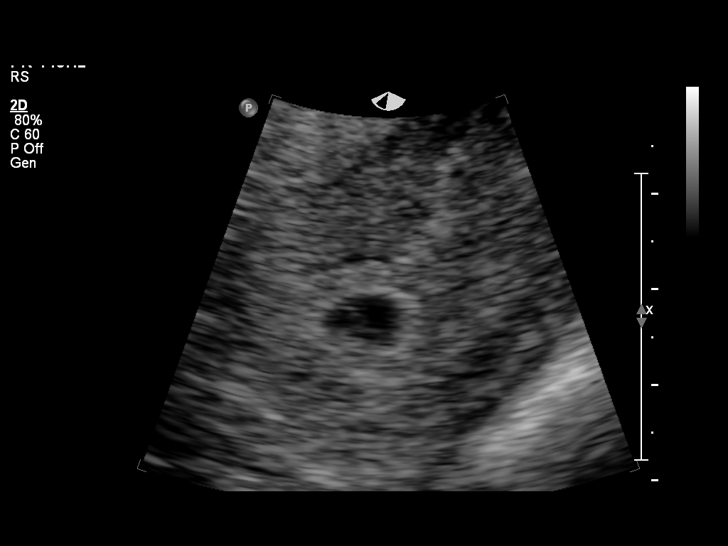
[im 7/25]
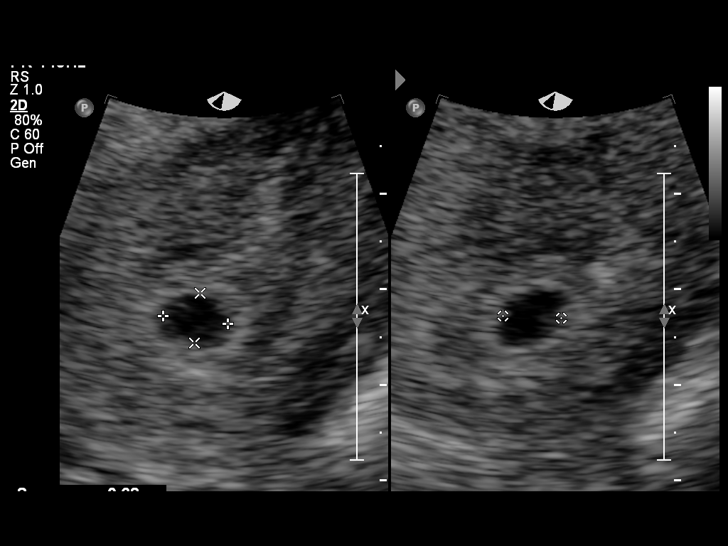
[im 9/25]
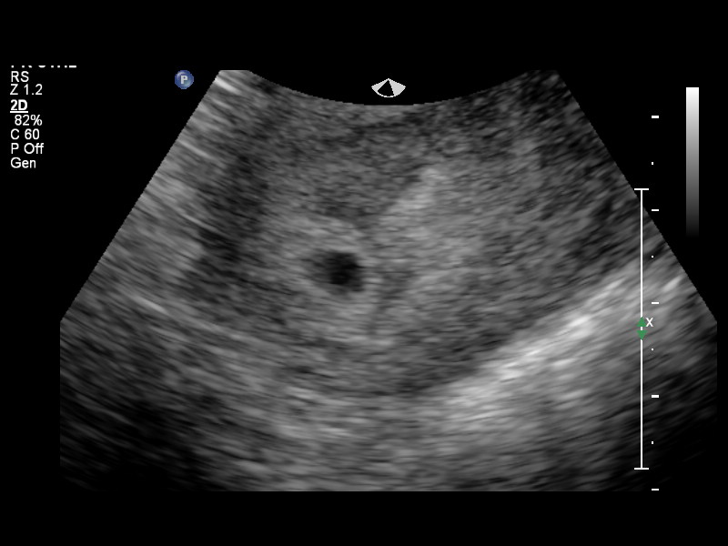
[im 10/25]
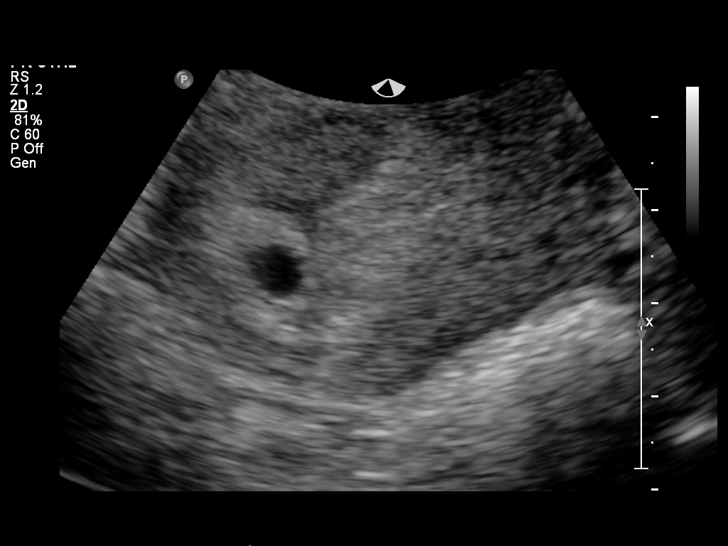
[im 12/25]
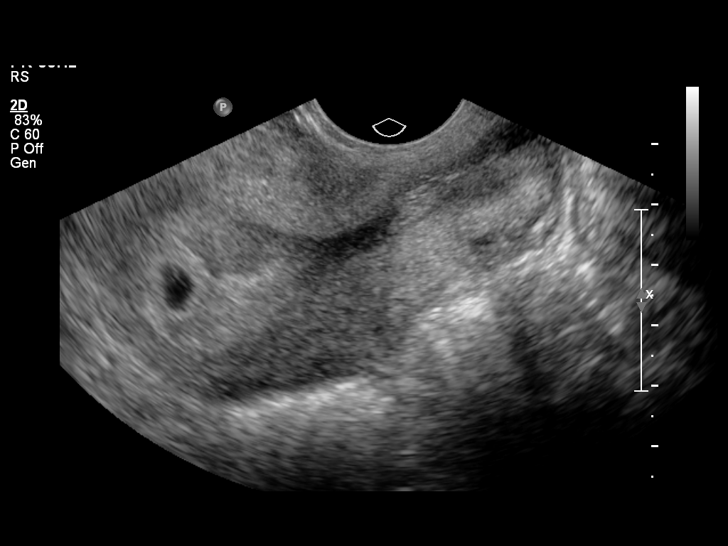
[im 14/25]
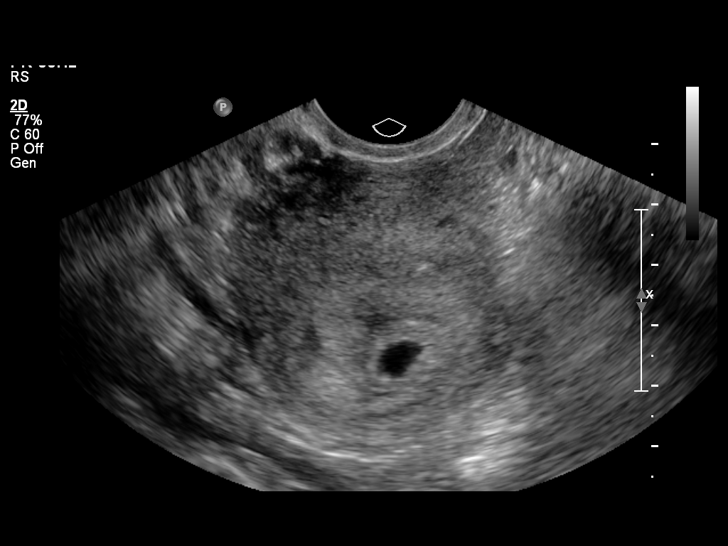
[im 16/25]
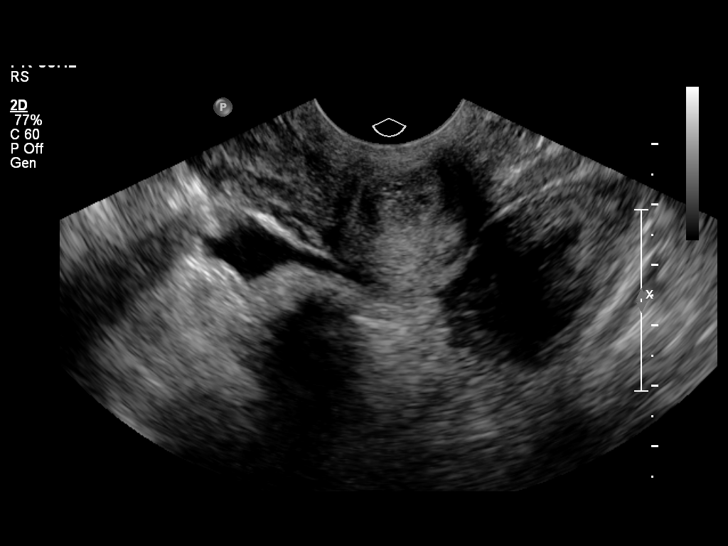
[im 17/25]
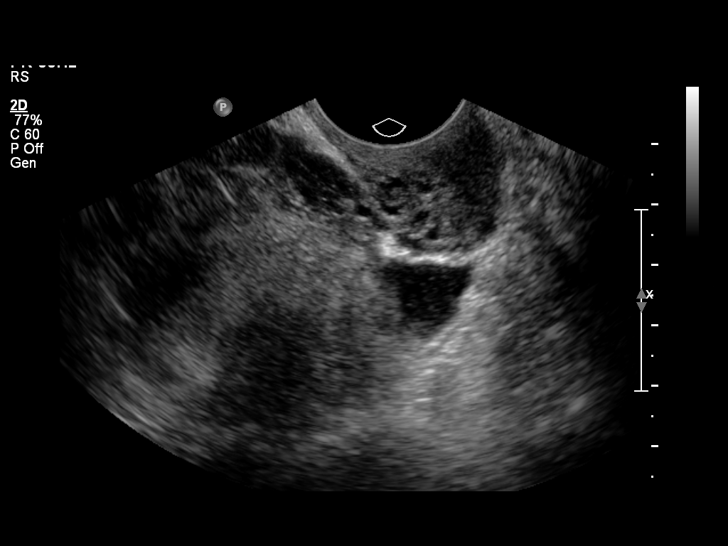
[im 19/25]
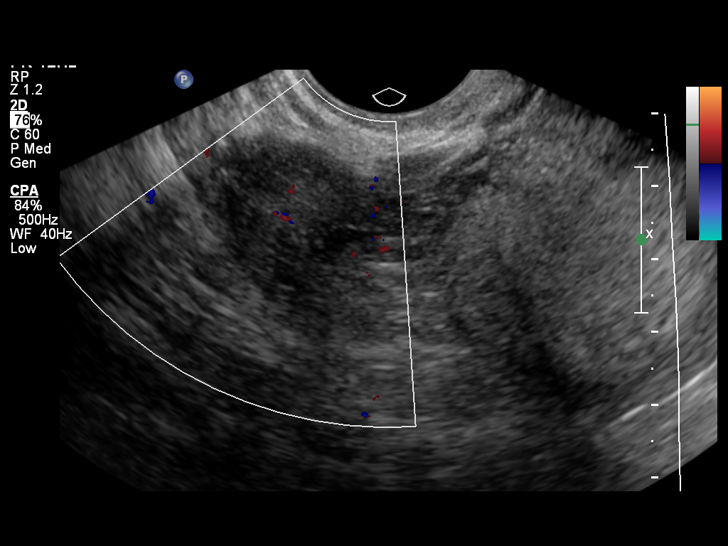
[im 21/25]
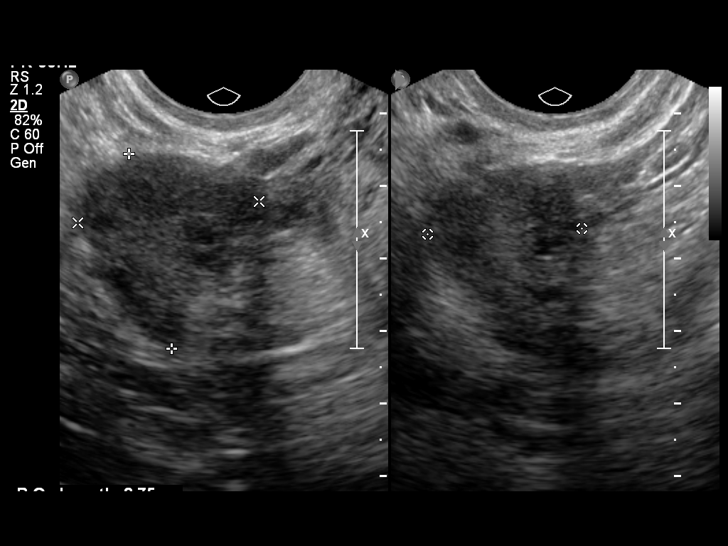
[im 23/25]
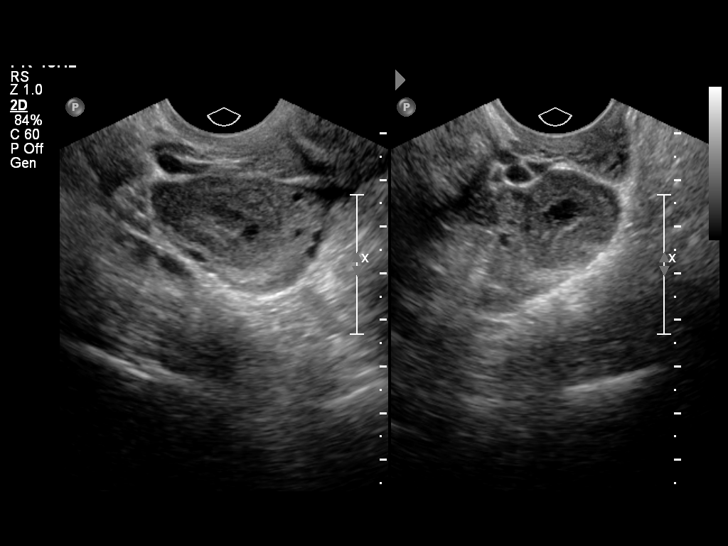
[im 25/25]
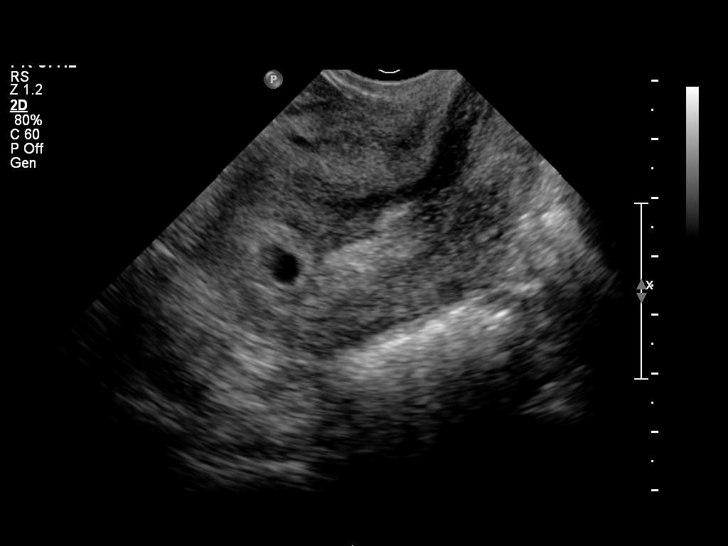

[14 of 25 positions shown; findings below may reference images not displayed]

FINDINGS: Intrauterine gestational sac: Visualized/normal in shape.

Yolk sac:  Not clearly visual.

Embryo:  Not visualized.

Cardiac Activity: Not visualized.

MSD: 6.1  mm   5 w   1  d

US EDC: [DATE].

Maternal uterus/adnexae: Ovaries appear normal except for probable
corpus luteum cyst in the left ovary. Mild amount of free fluid is
noted which is significantly decreased compared to prior exam.
IMPRESSION: Small intrauterine fluid collection is now seen. Probable early
intrauterine gestational sac, but no definite yolk sac, fetal pole
or cardiac activity yet visualized. Recommend follow-up quantitative
B-HCG levels and follow-up US in 14 days to confirm and assess
viability. This recommendation follows SRU consensus guidelines:
Diagnostic Criteria for Nonviable Pregnancy Early in the First
Trimester. N Engl J Med [36]; [DATE].

## 2014-04-06 MED ORDER — METRONIDAZOLE 500 MG PO TABS: 500.0000 mg | ORAL_TABLET | Freq: Two times a day (BID) | ORAL | Status: DC

## 2014-04-06 NOTE — Discharge Instructions (Signed)
°Ectopic Pregnancy °An ectopic pregnancy is when the fertilized egg attaches (implants) outside the uterus. Most ectopic pregnancies occur in the fallopian tube. Rarely do ectopic pregnancies occur on the ovary, intestine, pelvis, or cervix. In an ectopic pregnancy, the fertilized egg does not have the ability to develop into a normal, healthy baby.  °A ruptured ectopic pregnancy is one in which the fallopian tube gets torn or bursts and results in internal bleeding. Often there is intense abdominal pain, and sometimes, vaginal bleeding. Having an ectopic pregnancy can be life threatening. If left untreated, this dangerous condition can lead to a blood transfusion, abdominal surgery, or even death. °CAUSES  °Damage to the fallopian tubes is the suspected cause in most ectopic pregnancies.  °RISK FACTORS °Depending on your circumstances, the risk of having an ectopic pregnancy will vary. The level of risk can be divided into three categories. °High Risk °· You have gone through infertility treatment. °· You have had a previous ectopic pregnancy. °· You have had previous tubal surgery. °· You have had previous surgery to have the fallopian tubes tied (tubal ligation). °· You have tubal problems or diseases. °· You have been exposed to DES. DES is a medicine that was used until 1971 and had effects on babies whose mothers took the medicine. °· You become pregnant while using an intrauterine device (IUD) for birth control.  °Moderate Risk °· You have a history of infertility. °· You have a history of a sexually transmitted infection (STI). °· You have a history of pelvic inflammatory disease (PID). °· You have scarring from endometriosis. °· You have multiple sexual partners. °· You smoke.  °Low Risk °· You have had previous pelvic surgery. °· You use vaginal douching. °· You became sexually active before 25 years of age. °SIGNS AND SYMPTOMS  °An ectopic pregnancy should be suspected in anyone who has missed a period  and has abdominal pain or bleeding. °· You may experience normal pregnancy symptoms, such as: °¨ Nausea. °¨ Tiredness. °¨ Breast tenderness. °· Other symptoms may include: °¨ Pain with intercourse. °¨ Irregular vaginal bleeding or spotting. °¨ Cramping or pain on one side or in the lower abdomen. °¨ Fast heartbeat. °¨ Passing out while having a bowel movement. °· Symptoms of a ruptured ectopic pregnancy and internal bleeding may include: °¨ Sudden, severe pain in the abdomen and pelvis. °¨ Dizziness or fainting. °¨ Pain in the shoulder area. °DIAGNOSIS  °Tests that may be performed include: °· A pregnancy test. °· An ultrasound test. °· Testing the specific level of pregnancy hormone in the bloodstream. °· Taking a sample of uterus tissue (dilation and curettage, D&C). °· Surgery to perform a visual exam of the inside of the abdomen using a thin, lighted tube with a tiny camera on the end (laparoscope). °TREATMENT  °An injection of a medicine called methotrexate may be given. This medicine causes the pregnancy tissue to be absorbed. It is given if: °· The diagnosis is made early. °· The fallopian tube has not ruptured. °· You are considered to be a good candidate for the medicine. °Usually, pregnancy hormone blood levels are checked after methotrexate treatment. This is to be sure the medicine is effective. It may take 4-6 weeks for the pregnancy to be absorbed (though most pregnancies will be absorbed by 3 weeks). °Surgical treatment may be needed. A laparoscope may be used to remove the pregnancy tissue. If severe internal bleeding occurs, a cut (incision) may be made in the lower abdomen (laparotomy), and the ectopic   pregnancy is removed. This stops the bleeding. Part of the fallopian tube, or the whole tube, may be removed as well (salpingectomy). After surgery, pregnancy hormone tests may be done to be sure there is no pregnancy tissue left. You may receive a Rho (D) immune globulin shot if you are Rh negative  and the father is Rh positive, or if you do not know the Rh type of the father. This is to prevent problems with any future pregnancy. °SEEK IMMEDIATE MEDICAL CARE IF:  °You have any symptoms of an ectopic pregnancy. This is a medical emergency. °MAKE SURE YOU: °· Understand these instructions. °· Will watch your condition. °· Will get help right away if you are not doing well or get worse. °Document Released: 05/19/2004 Document Revised: 08/26/2013 Document Reviewed: 11/08/2012 °Exit[employer]® Patient Information ©2015 [employer]. This information is not intended to replace advice given to you by your health care provider. Make sure you discuss any questions you have with your health care provider. ° ° °

## 2014-04-06 NOTE — MAU Note (Signed)
Lower abdominal cramping since 530 pm. Denies vaginal bleeding. Thin, clear vaginal discharge, mild odor. Denies urinary complaints.

## 2014-04-06 NOTE — MAU Provider Note (Signed)
History     CSN: [id number]  Arrival date and time: 04/06/14 1859   First Provider Initiated Contact with Patient 04/06/14 1949      Chief Complaint  Patient presents with  . Abdominal Cramping   HPI  [place] N Demas is a 23 y.o. G3P1011.  She had ED visit 12/3 for RLQ pain. Her BHCG was 88, U/S  Showed no IUGS right adnexal masses, large amt free fluid in the pelvic c/w ruptured cyst. EMS was 14 mm, large bowel gas was observed. Repeat BHCG 12/5 was 176. She presents today with bil low ML cramping since 1600. No bleeding, has clear thin discharge with sl odor, no UTI S&S. GC/CT were negative, wet prep showed mod clue and few WBC's on 12/3- no treatment.  OB History    Gravida Para Term Preterm AB TAB SAB Ectopic Multiple Living   3 1 1  1  1   1       Past Medical History  Diagnosis Date  . Allergy   . Migraine headache   . Polycystic ovarian syndrome   . Pregnancy induced hypertension     Past Surgical History  Procedure Laterality Date  . Cholecystectomy      Family History  Problem Relation Age of Onset  . Cancer Paternal Grandmother     ovarian cancer    History  Substance Use Topics  . Smoking status: Former Research scientist (life sciences)  . Smokeless tobacco: Never Used  . Alcohol Use: No     Comment: occasional    Allergies:  Allergies  Allergen Reactions  . Other Anaphylaxis    Axe and Tag body spray, lysol  . Shellfish Allergy Anaphylaxis and Hives  . Eggs Or Egg-Derived Products Itching and Nausea Only    Prescriptions prior to admission  Medication Sig Dispense Refill Last Dose  . Doxylamine-Pyridoxine (DICLEGIS) 10-10 MG TBEC Take 2 tablets by mouth at bedtime.   04/05/2014 at Unknown time  . prenatal vitamin w/FE, FA (PRENATAL 1 + 1) 27-1 MG TABS tablet Take 1 tablet by mouth daily at 12 noon. 30 each 0 04/05/2014 at Unknown time  . azithromycin (ZITHROMAX) 250 MG tablet Take 1 tablet (250 mg total) by mouth daily. Take 2 today, the 1 a day for 4 days 6 tablet 0  04/04/2014    Review of Systems  Constitutional: Negative for fever and chills.  Gastrointestinal: Positive for abdominal pain (cramping). Negative for nausea, vomiting, diarrhea and constipation.  Genitourinary: Negative for dysuria, urgency and frequency.       Clear thin discharge  Neurological: Negative for dizziness and weakness.   Physical Exam   Blood pressure 135/75, pulse 109, temperature 98.9 F (37.2 C), temperature source Oral, resp. rate 18, height 5' 6.5" (1.689 m), weight 92.806 kg (204 lb 9.6 oz), last menstrual period 02/13/2014, SpO2 100 %.  Physical Exam  Nursing note and vitals reviewed. Constitutional: She is oriented to person, place, and time. She appears well-developed and well-nourished.  GI: Soft. There is tenderness (mildly).  Genitourinary:  Pelvic exam- Ext gen- nl anatomy, skin intact Vagina- scant thin white discharge Cx- closed Uterus- nl size, sl tender Adn- no masses palp, sl tender bil  Musculoskeletal: Normal range of motion.  Neurological: She is alert and oriented to person, place, and time.  Skin: Skin is warm and dry.  Psychiatric: She has a normal mood and affect. Her behavior is normal.    MAU Course  Procedures  MDM 2100- BHCG has increased to 4,197.  Will send for U/S. Pt and her husband informed of plan of care. Marcille Buffy, CNM assuming care of pt.  Results for [place], [place] N (MRN [id number]) as of 04/06/2014 22:04  Ref. Range 03/27/2014 20:03 03/27/2014 20:10 03/29/2014 11:58 04/06/2014 20:12  hCG, Beta Chain, Quant, S Latest Range: <5 mIU/mL 88 (H)  176 (H) 4197 (H)   US Ob Transvaginal  04/06/2014   CLINICAL DATA:  Abdominal cramping, pregnancy.  EXAM: TRANSVAGINAL OB ULTRASOUND  TECHNIQUE: Transvaginal ultrasound was performed for complete evaluation of the gestation as well as the maternal uterus, adnexal regions, and pelvic cul-de-sac.  COMPARISON:  March 27, 2014.  FINDINGS: Intrauterine gestational sac:  Visualized/normal in shape.  Yolk sac:  Not clearly visual.  Embryo:  Not visualized.  Cardiac Activity: Not visualized.  MSD: 6.1  mm   5 w   1  d  [place] [facility]: December 05, 2013.  Maternal uterus/adnexae: Ovaries appear normal except for probable corpus luteum cyst in the left ovary. Mild amount of free fluid is noted which is significantly decreased compared to prior exam.  IMPRESSION: Small intrauterine fluid collection is now seen. Probable early intrauterine gestational sac, but no definite yolk sac, fetal pole or cardiac activity yet visualized. Recommend follow-up quantitative B-HCG levels and follow-up US in 14 days to confirm and assess viability. This recommendation follows SRU consensus guidelines: Diagnostic Criteria for Nonviable Pregnancy Early in the First Trimester. [employer] Med 2013; 409:8119-14.   Electronically Signed   By: Sabino Dick M.D.   On: 04/06/2014 21:57   Patient with clearly doubling HCGs. Will repeat US in 10 days   Assessment and Plan   1. Abdominal pain during pregnancy    Ectopic precautions Return to MAU as needed Repeat US in 10 days  Follow-up Information    Follow up with [facility].   Why:  As needed, Ultrasound department will call to schedule appointment in about 10 days.   Contact information:   [address] [id number] [place] [address] [phone]       Elesa Massed. 04/06/2014, 8:08 PM

## 2014-04-07 ENCOUNTER — Other Ambulatory Visit: Payer: Self-pay | Admitting: *Deleted

## 2014-04-07 ENCOUNTER — Telehealth: Payer: Self-pay | Admitting: Women's Health

## 2014-04-07 NOTE — Telephone Encounter (Signed)
Pt states she went to the ED yesterday with d/c, cramping and n/v.  They did an [place] and lab work and told her if symptoms persist to f/u with them or contact us.  She states she is still having cramping and n/v, she is scheduled for [place] here on 12/21.  I advised her to push fluids, take Tylenol as needed and use the Diclegis that was given to her and keep appointment on 04/14/14 for dating [place] and to call us back if develops heavy bleeding or severe cramping.  Pt states was told by Manus Gunning to call her back to let her know if the Diclegis was helping and she would sent an RX to the pharmacy (she was given samples), pt states she would like the RX sent to [facility] [place], she has enough of the samples left to use until Manus Gunning is back in the office on Wed.  I informed pt I would route a message to Manus Gunning about the Diclegis, pt verbalized understanding.

## 2014-04-09 MED ORDER — DOXYLAMINE-PYRIDOXINE 10-10 MG PO TBEC: 2.0000 | DELAYED_RELEASE_TABLET | Freq: Every day | ORAL | Status: DC

## 2014-04-09 NOTE — Telephone Encounter (Signed)
diclegis # 120 1RF sent to pharmacy.  Pt has [employer], so prior auth not done

## 2014-04-11 ENCOUNTER — Other Ambulatory Visit: Payer: Self-pay | Admitting: Obstetrics and Gynecology

## 2014-04-11 DIAGNOSIS — O3680X0 Pregnancy with inconclusive fetal viability, not applicable or unspecified: Secondary | ICD-10-CM

## 2014-04-14 ENCOUNTER — Ambulatory Visit (INDEPENDENT_AMBULATORY_CARE_PROVIDER_SITE_OTHER): Payer: 59

## 2014-04-14 DIAGNOSIS — O3680X0 Pregnancy with inconclusive fetal viability, not applicable or unspecified: Secondary | ICD-10-CM

## 2014-04-14 NOTE — Progress Notes (Signed)
Transvaginal Ultrasound performed-retroverted uterus with single IUP noted, +FCA noted FHR-120 bpm, cx appears closed, bilateral adnexa appears WNL with C.L. Noted on LT, CRL c/w 6+2wks EDD 12/06/2014

## 2014-04-22 ENCOUNTER — Ambulatory Visit (INDEPENDENT_AMBULATORY_CARE_PROVIDER_SITE_OTHER): Payer: 59 | Admitting: Women's Health

## 2014-04-22 ENCOUNTER — Encounter: Payer: Self-pay | Admitting: Women's Health

## 2014-04-22 VITALS — BP 104/64 | Wt 202.0 lb

## 2014-04-22 DIAGNOSIS — Z0283 Encounter for blood-alcohol and blood-drug test: Secondary | ICD-10-CM

## 2014-04-22 DIAGNOSIS — Z331 Pregnant state, incidental: Secondary | ICD-10-CM

## 2014-04-22 DIAGNOSIS — Z1371 Encounter for nonprocreative screening for genetic disease carrier status: Secondary | ICD-10-CM

## 2014-04-22 DIAGNOSIS — Z3491 Encounter for supervision of normal pregnancy, unspecified, first trimester: Secondary | ICD-10-CM

## 2014-04-22 DIAGNOSIS — Z113 Encounter for screening for infections with a predominantly sexual mode of transmission: Secondary | ICD-10-CM

## 2014-04-22 DIAGNOSIS — Z1389 Encounter for screening for other disorder: Secondary | ICD-10-CM

## 2014-04-22 DIAGNOSIS — Z3490 Encounter for supervision of normal pregnancy, unspecified, unspecified trimester: Secondary | ICD-10-CM | POA: Insufficient documentation

## 2014-04-22 DIAGNOSIS — O09299 Supervision of pregnancy with other poor reproductive or obstetric history, unspecified trimester: Secondary | ICD-10-CM | POA: Insufficient documentation

## 2014-04-22 DIAGNOSIS — Z0184 Encounter for antibody response examination: Secondary | ICD-10-CM

## 2014-04-22 DIAGNOSIS — O09291 Supervision of pregnancy with other poor reproductive or obstetric history, first trimester: Secondary | ICD-10-CM

## 2014-04-22 DIAGNOSIS — Z3481 Encounter for supervision of other normal pregnancy, first trimester: Secondary | ICD-10-CM

## 2014-04-22 LAB — POCT URINALYSIS DIPSTICK
Blood, UA: NEGATIVE
Glucose, UA: NEGATIVE
Ketones, UA: NEGATIVE
LEUKOCYTES UA: NEGATIVE
Nitrite, UA: NEGATIVE

## 2014-04-22 LAB — CBC
HCT: 39.4 % (ref 36.0–46.0)
Hemoglobin: 13.5 g/dL (ref 12.0–15.0)
MCH: 28.8 pg (ref 26.0–34.0)
MCHC: 34.3 g/dL (ref 30.0–36.0)
MCV: 84.2 fL (ref 78.0–100.0)
MPV: 10 fL (ref 8.6–12.4)
PLATELETS: 311 10*3/uL (ref 150–400)
RBC: 4.68 MIL/uL (ref 3.87–5.11)
RDW: 14 % (ref 11.5–15.5)
WBC: 9.6 10*3/uL (ref 4.0–10.5)

## 2014-04-22 MED ORDER — CONCEPT DHA 53.5-38-1 MG PO CAPS: 1.0000 | ORAL_CAPSULE | Freq: Every day | ORAL | Status: DC

## 2014-04-22 NOTE — Patient Instructions (Signed)
Begin taking a 81mg  baby aspirin daily at 12 weeks of pregnancy to decrease risk of preeclampsia during pregnancy   Nausea & Vomiting  Have saltine crackers or pretzels by your bed and eat a few bites before you raise your head out of bed in the morning  Eat small frequent meals throughout the day instead of large meals  Drink plenty of fluids throughout the day to stay hydrated, just don't drink a lot of fluids with your meals.  This can make your stomach fill up faster making you feel sick  Do not brush your teeth right after you eat  Products with real ginger are good for nausea, like ginger ale and ginger hard candy Make sure it says made with real ginger!  Sucking on sour candy like lemon heads is also good for nausea  If your prenatal vitamins make you nauseated, take them at night so you will sleep through the nausea  Sea Bands  If you feel like you need medicine for the nausea & vomiting please let us know  If you are unable to keep any fluids or food down please let us know   First Trimester of Pregnancy The first trimester of pregnancy is from week 1 until the end of week 12 (months 1 through 3). A week after a sperm fertilizes an egg, the egg will implant on the wall of the uterus. This embryo will begin to develop into a baby. Genes from you and your partner are forming the baby. The female genes determine whether the baby is a boy or a girl. At 6-8 weeks, the eyes and face are formed, and the heartbeat can be seen on ultrasound. At the end of 12 weeks, all the baby's organs are formed.  Now that you are pregnant, you will want to do everything you can to have a healthy baby. Two of the most important things are to get good prenatal care and to follow your health care provider's instructions. Prenatal care is all the medical care you receive before the baby's birth. This care will help prevent, find, and treat any problems during the pregnancy and childbirth. BODY CHANGES Your  body goes through many changes during pregnancy. The changes vary from woman to woman.   You may gain or lose a couple of pounds at first.  You may feel sick to your stomach (nauseous) and throw up (vomit). If the vomiting is uncontrollable, call your health care provider.  You may tire easily.  You may develop headaches that can be relieved by medicines approved by your health care provider.  You may urinate more often. Painful urination may mean you have a bladder infection.  You may develop heartburn as a result of your pregnancy.  You may develop constipation because certain hormones are causing the muscles that push waste through your intestines to slow down.  You may develop hemorrhoids or swollen, bulging veins (varicose veins).  Your breasts may begin to grow larger and become tender. Your nipples may stick out more, and the tissue that surrounds them (areola) may become darker.  Your gums may bleed and may be sensitive to brushing and flossing.  Dark spots or blotches (chloasma, mask of pregnancy) may develop on your face. This will likely fade after the baby is born.  Your menstrual periods will stop.  You may have a loss of appetite.  You may develop cravings for certain kinds of food.  You may have changes in your emotions from day to  day, such as being excited to be pregnant or being concerned that something may go wrong with the pregnancy and baby.  You may have more vivid and strange dreams.  You may have changes in your hair. These can include thickening of your hair, rapid growth, and changes in texture. Some women also have hair loss during or after pregnancy, or hair that feels dry or thin. Your hair will most likely return to normal after your baby is born. WHAT TO EXPECT AT YOUR PRENATAL VISITS During a routine prenatal visit:  You will be weighed to make sure you and the baby are growing normally.  Your blood pressure will be taken.  Your abdomen will  be measured to track your baby's growth.  The fetal heartbeat will be listened to starting around week 10 or 12 of your pregnancy.  Test results from any previous visits will be discussed. Your health care provider may ask you:  How you are feeling.  If you are feeling the baby move.  If you have had any abnormal symptoms, such as leaking fluid, bleeding, severe headaches, or abdominal cramping.  If you have any questions. Other tests that may be performed during your first trimester include:  Blood tests to find your blood type and to check for the presence of any previous infections. They will also be used to check for low iron levels (anemia) and Rh antibodies. Later in the pregnancy, blood tests for diabetes will be done along with other tests if problems develop.  Urine tests to check for infections, diabetes, or protein in the urine.  An ultrasound to confirm the proper growth and development of the baby.  An amniocentesis to check for possible genetic problems.  Fetal screens for spina bifida and Down syndrome.  You may need other tests to make sure you and the baby are doing well. HOME CARE INSTRUCTIONS  Medicines  Follow your health care provider's instructions regarding medicine use. Specific medicines may be either safe or unsafe to take during pregnancy.  Take your prenatal vitamins as directed.  If you develop constipation, try taking a stool softener if your health care provider approves. Diet  Eat regular, well-balanced meals. Choose a variety of foods, such as meat or vegetable-based protein, fish, milk and low-fat dairy products, vegetables, fruits, and whole grain breads and cereals. Your health care provider will help you determine the amount of weight gain that is right for you.  Avoid raw meat and uncooked cheese. These carry germs that can cause birth defects in the baby.  Eating four or five small meals rather than three large meals a day may help  relieve nausea and vomiting. If you start to feel nauseous, eating a few soda crackers can be helpful. Drinking liquids between meals instead of during meals also seems to help nausea and vomiting.  If you develop constipation, eat more high-fiber foods, such as fresh vegetables or fruit and whole grains. Drink enough fluids to keep your urine clear or pale yellow. Activity and Exercise  Exercise only as directed by your health care provider. Exercising will help you:  Control your weight.  Stay in shape.  Be prepared for labor and delivery.  Experiencing pain or cramping in the lower abdomen or low back is a good sign that you should stop exercising. Check with your health care provider before continuing normal exercises.  Try to avoid standing for long periods of time. Move your legs often if you must stand in one place  for a long time.  Avoid heavy lifting.  Wear low-heeled shoes, and practice good posture.  You may continue to have sex unless your health care provider directs you otherwise. Relief of Pain or Discomfort  Wear a good support bra for breast tenderness.   Take warm sitz baths to soothe any pain or discomfort caused by hemorrhoids. Use hemorrhoid cream if your health care provider approves.   Rest with your legs elevated if you have leg cramps or low back pain.  If you develop varicose veins in your legs, wear support hose. Elevate your feet for 15 minutes, 3-4 times a day. Limit salt in your diet. Prenatal Care  Schedule your prenatal visits by the twelfth week of pregnancy. They are usually scheduled monthly at first, then more often in the last 2 months before delivery.  Write down your questions. Take them to your prenatal visits.  Keep all your prenatal visits as directed by your health care provider. Safety  Wear your seat belt at all times when driving.  Make a list of emergency phone numbers, including numbers for family, friends, the hospital, and  police and fire departments. General Tips  Ask your health care provider for a referral to a local prenatal education class. Begin classes no later than at the beginning of month 6 of your pregnancy.  Ask for help if you have counseling or nutritional needs during pregnancy. Your health care provider can offer advice or refer you to specialists for help with various needs.  Do not use hot tubs, steam rooms, or saunas.  Do not douche or use tampons or scented sanitary pads.  Do not cross your legs for long periods of time.  Avoid cat litter boxes and soil used by cats. These carry germs that can cause birth defects in the baby and possibly loss of the fetus by miscarriage or stillbirth.  Avoid all smoking, herbs, alcohol, and medicines not prescribed by your health care provider. Chemicals in these affect the formation and growth of the baby.  Schedule a dentist appointment. At home, brush your teeth with a soft toothbrush and be gentle when you floss. SEEK MEDICAL CARE IF:   You have dizziness.  You have mild pelvic cramps, pelvic pressure, or nagging pain in the abdominal area.  You have persistent nausea, vomiting, or diarrhea.  You have a bad smelling vaginal discharge.  You have pain with urination.  You notice increased swelling in your face, hands, legs, or ankles. SEEK IMMEDIATE MEDICAL CARE IF:   You have a fever.  You are leaking fluid from your vagina.  You have spotting or bleeding from your vagina.  You have severe abdominal cramping or pain.  You have rapid weight gain or loss.  You vomit blood or material that looks like coffee grounds.  You are exposed to [place] measles and have never had them.  You are exposed to fifth disease or chickenpox.  You develop a severe headache.  You have shortness of breath.  You have any kind of trauma, such as from a fall or a car accident. Document Released: 04/05/2001 Document Revised: 08/26/2013 Document Reviewed:  02/19/2013 [facility] Patient Information 2015 [place], [place]. This information is not intended to replace advice given to you by your health care provider. Make sure you discuss any questions you have with your health care provider.

## 2014-04-22 NOTE — Progress Notes (Addendum)
  Subjective:  Dana Mcpherson is a 23 y.o. G22P1011 Caucasian female at [redacted]w[redacted]d by 6wk u/s, being seen today for her first obstetrical visit.  Her obstetrical history is significant for IOL @ 40+ wks previous pregnancy for pre-e.  Pregnancy history fully reviewed.  Patient reports no complaints, on diclegis for n/v- helping. Denies vb, cramping, uti s/s, abnormal/malodorous vag d/c, or vulvovaginal itching/irritation.  BP 104/64 mmHg  Wt 202 lb (91.627 kg)  LMP 02/13/2014 (Exact Date)  HISTORY: OB History  Gravida Para Term Preterm AB SAB TAB Ectopic Multiple Living  3 1 1  1 1    1     # Outcome Date GA Lbr Len/2nd Weight Sex Delivery Anes PTL Lv  3 Current           2 Term 07/24/08 [redacted]w[redacted]d  7 lb 7 oz (3.374 kg) M Vag-Spont EPI N Y  1 SAB              Past Medical History  Diagnosis Date  . Allergy   . Migraine headache   . Polycystic ovarian syndrome   . Pregnancy induced hypertension    Past Surgical History  Procedure Laterality Date  . Cholecystectomy     Family History  Problem Relation Age of Onset  . Cancer Paternal Grandmother     ovarian cancer    Exam   System:     General: Well developed & nourished, no acute distress   Skin: Warm & dry, normal coloration and turgor, no rashes   Neurologic: Alert & oriented, normal mood   Cardiovascular: Regular rate & rhythm   Respiratory: Effort & rate normal, LCTAB, acyanotic   Abdomen: Soft, non tender   Extremities: normal strength, tone   Thin prep pap smear neg 2014 at [facility] FHR: 157 via u/s   Assessment:   Pregnancy: G3P1011 Patient Active Problem List   Diagnosis Date Noted  . Supervision of normal pregnancy 04/22/2014    Priority: High  . PCOS (polycystic ovarian syndrome) 03/03/2014  . Hyperinsulinemia 03/03/2014  . Morbid obesity 05/23/2013  . DUB (dysfunctional uterine bleeding) 05/23/2013    [redacted]w[redacted]d G3P1011 New OB visit N/V of pregancy H/O pre-e  Plan:  Initial labs drawn Continue prenatal  vitamins Problem list reviewed and updated Reviewed n/v relief measures and warning s/s to report Reviewed recommended weight gain based on pre-gravid BMI Encouraged well-balanced diet Genetic Screening discussed Integrated Screen: declined Cystic fibrosis screening discussed requested Ultrasound discussed; fetal survey: requested Follow up in 4 weeks for visit [place] completed Begin baby asa @ 12wks d/t h/o pre-e Allergic to eggs, so no flu shot  Tawnya Crook CNM, [facility] 04/22/2014 11:06 AM

## 2014-04-23 LAB — URINALYSIS, ROUTINE W REFLEX MICROSCOPIC
BILIRUBIN URINE: NEGATIVE
Glucose, UA: NEGATIVE mg/dL
Hgb urine dipstick: NEGATIVE
Ketones, ur: NEGATIVE mg/dL
NITRITE: NEGATIVE
Protein, ur: NEGATIVE mg/dL
SPECIFIC GRAVITY, URINE: 1.013 (ref 1.005–1.030)
UROBILINOGEN UA: 0.2 mg/dL (ref 0.0–1.0)
pH: 7 (ref 5.0–8.0)

## 2014-04-23 LAB — URINALYSIS, MICROSCOPIC ONLY
Casts: NONE SEEN
Crystals: NONE SEEN

## 2014-04-23 LAB — ANTIBODY SCREEN: Antibody Screen: NEGATIVE

## 2014-04-23 LAB — VARICELLA ZOSTER ANTIBODY, IGG: Varicella IgG: >4000 Index — ABNORMAL HIGH (ref <135.00)

## 2014-04-23 LAB — DRUG SCREEN, URINE, NO CONFIRMATION
Amphetamine Screen, Ur: NEGATIVE
BENZODIAZEPINES.: NEGATIVE
Barbiturate Quant, Ur: NEGATIVE
CREATININE, U: 228.8 mg/dL
Cocaine Metabolites: NEGATIVE
METHADONE: NEGATIVE
Marijuana Metabolite: NEGATIVE
OPIATE SCREEN, URINE: NEGATIVE
PHENCYCLIDINE (PCP): NEGATIVE
PROPOXYPHENE: NEGATIVE

## 2014-04-23 LAB — OXYCODONE SCREEN, UA, RFLX CONFIRM: Oxycodone Screen, Ur: NEGATIVE ng/mL

## 2014-04-23 LAB — RUBELLA SCREEN: Rubella: 1.39 Index — ABNORMAL HIGH (ref <0.90)

## 2014-04-23 LAB — HEPATITIS B SURFACE ANTIGEN: Hepatitis B Surface Ag: NEGATIVE

## 2014-04-24 LAB — CYSTIC FIBROSIS DIAGNOSTIC STUDY

## 2014-04-24 LAB — URINE CULTURE
Colony Count: NO GROWTH
ORGANISM ID, BACTERIA: NO GROWTH

## 2014-05-01 ENCOUNTER — Encounter (HOSPITAL_COMMUNITY): Payer: Self-pay | Admitting: *Deleted

## 2014-05-01 ENCOUNTER — Inpatient Hospital Stay (HOSPITAL_COMMUNITY)
Admission: AD | Admit: 2014-05-01 | Discharge: 2014-05-02 | Disposition: A | Discharge status: Home / Self Care | Payer: 59 | Source: Ambulatory Visit | Attending: Obstetrics & Gynecology | Admitting: Obstetrics & Gynecology

## 2014-05-01 DIAGNOSIS — Z87891 Personal history of nicotine dependence: Secondary | ICD-10-CM | POA: Insufficient documentation

## 2014-05-01 DIAGNOSIS — B373 Candidiasis of vulva and vagina: Secondary | ICD-10-CM | POA: Insufficient documentation

## 2014-05-01 DIAGNOSIS — R197 Diarrhea, unspecified: Secondary | ICD-10-CM | POA: Diagnosis present

## 2014-05-01 DIAGNOSIS — B3731 Acute candidiasis of vulva and vagina: Secondary | ICD-10-CM

## 2014-05-01 HISTORY — DX: Other complications of anesthesia, initial encounter: T88.59XA

## 2014-05-01 HISTORY — DX: Adverse effect of unspecified anesthetic, initial encounter: T41.45XA

## 2014-05-01 LAB — URINALYSIS, ROUTINE W REFLEX MICROSCOPIC
Bilirubin Urine: NEGATIVE
Glucose, UA: NEGATIVE mg/dL
Hgb urine dipstick: NEGATIVE
Ketones, ur: NEGATIVE mg/dL
Leukocytes, UA: NEGATIVE
Nitrite: NEGATIVE
Protein, ur: NEGATIVE mg/dL
Specific Gravity, Urine: 1.025 (ref 1.005–1.030)
Urobilinogen, UA: 0.2 mg/dL (ref 0.0–1.0)
pH: 6 (ref 5.0–8.0)

## 2014-05-01 NOTE — MAU Provider Note (Signed)
History     CSN: [id number]  Arrival date and time: 05/01/14 2231   First Provider Initiated Contact with Patient 05/01/14 2338      No chief complaint on file.  HPI Dana Mcpherson 24 y.o. [phone] @[redacted]w[redacted]d  presents to MAU complaining of diarrhea x 2 days, green vaginal discharge throughout the pregnancy and pains that are going from the vagina to her belly button.   She has had no vomiting today and diarrhea has occurred 3 times today, always just after eating.   She denies vaginal bleeding, LOF, vag itching, dysuria, chest pain, shortness of breath.    She admits to nausea and vomiting, headache.   OB History    Gravida Para Term Preterm AB TAB SAB Ectopic Multiple Living   3 1 1  1  1   1       Obstetric Comments   IOL for pre-e      Past Medical History  Diagnosis Date  . Allergy   . Migraine headache   . Polycystic ovarian syndrome   . Pregnancy induced hypertension   . Complication of anesthesia     Past Surgical History  Procedure Laterality Date  . Cholecystectomy      Family History  Problem Relation Age of Onset  . Cancer Paternal Grandmother     ovarian cancer    History  Substance Use Topics  . Smoking status: Former Research scientist (life sciences)  . Smokeless tobacco: Never Used  . Alcohol Use: No     Comment: occasional    Allergies:  Allergies  Allergen Reactions  . Other Anaphylaxis    Axe and Tag body spray, lysol  . Shellfish Allergy Anaphylaxis and Hives  . Eggs Or Egg-Derived Products Itching and Nausea Only    Prescriptions prior to admission  Medication Sig Dispense Refill Last Dose  . Doxylamine-Pyridoxine (DICLEGIS) 10-10 MG TBEC Take 2 tablets by mouth at bedtime. May take an additional tablet in the morning and an additional tablet in the afternoon prn nausea 120 tablet 1 05/01/2014 at Unknown time  . Prenat-FeFum-FePo-FA-Omega 3 (CONCEPT DHA) 53.5-38-1 MG CAPS Take 1 capsule by mouth daily. 30 capsule 11 05/01/2014 at Unknown time  . azithromycin (ZITHROMAX)  250 MG tablet Take 1 tablet (250 mg total) by mouth daily. Take 2 today, the 1 a day for 4 days (Patient not taking: Reported on 04/22/2014) 6 tablet 0 Not Taking  . metroNIDAZOLE (FLAGYL) 500 MG tablet Take 1 tablet (500 mg total) by mouth 2 (two) times daily. (Patient not taking: Reported on 04/22/2014) 14 tablet 0 Not Taking    ROS Pertinent ROS in HPI  Physical Exam   Blood pressure 116/64, pulse 87, temperature 97.8 F (36.6 C), temperature source Oral, resp. rate 20, height 5' 6.5" (1.689 m), weight 199 lb 12.8 oz (90.629 kg), last menstrual period 02/13/2014.  Physical Exam  Constitutional: She is oriented to person, place, and time. She appears well-developed and well-nourished. No distress.  HENT:  Head: Normocephalic and atraumatic.  Eyes: EOM are normal.  Neck: Normal range of motion.  Cardiovascular: Normal rate, regular rhythm and normal heart sounds.   Respiratory: Effort normal and breath sounds normal. No respiratory distress.  GI: Soft. Bowel sounds are normal. She exhibits no distension. There is no tenderness. There is no rebound and no guarding.  Genitourinary:  Vaginal mucosa is erythematous Mod amt of clumpy white discharge present No CMT/no adnexal mass or tenderness appreciated  Musculoskeletal: Normal range of motion.  Neurological: She is  alert and oriented to person, place, and time.  Skin: Skin is warm and dry.  Psychiatric: She has a normal mood and affect.   Results for orders placed or performed during the hospital encounter of 05/01/14 (from the past 72 hour(s))  Urinalysis, Routine w reflex microscopic     Status: None   Collection Time: 05/01/14 10:56 PM  Result Value Ref Range   Color, Urine YELLOW YELLOW   APPearance CLEAR CLEAR   Specific Gravity, Urine 1.025 1.005 - 1.030   pH 6.0 5.0 - 8.0   Glucose, UA NEGATIVE NEGATIVE mg/dL   Hgb urine dipstick NEGATIVE NEGATIVE   Bilirubin Urine NEGATIVE NEGATIVE   Ketones, ur NEGATIVE NEGATIVE mg/dL    Protein, ur NEGATIVE NEGATIVE mg/dL   Urobilinogen, UA 0.2 0.0 - 1.0 mg/dL   Nitrite NEGATIVE NEGATIVE   Leukocytes, UA NEGATIVE NEGATIVE    Comment: MICROSCOPIC NOT DONE ON URINES WITH NEGATIVE PROTEIN, BLOOD, LEUKOCYTES, NITRITE, OR GLUCOSE <1000 mg/dL.  Wet prep, genital     Status: Abnormal   Collection Time: 05/01/14 11:51 PM  Result Value Ref Range   Yeast Wet Prep HPF POC NONE SEEN NONE SEEN   Trich, Wet Prep NONE SEEN NONE SEEN   Clue Cells Wet Prep HPF POC NONE SEEN NONE SEEN   WBC, Wet Prep HPF POC FEW (A) NONE SEEN    Comment: MANY BACTERIA SEEN  CBC     Status: Abnormal   Collection Time: 05/01/14 11:55 PM  Result Value Ref Range   WBC 11.8 (H) 4.0 - 10.5 K/uL   RBC 4.35 3.87 - 5.11 MIL/uL   Hemoglobin 12.9 12.0 - 15.0 g/dL   HCT 36.3 36.0 - 46.0 %   MCV 83.4 78.0 - 100.0 fL   MCH 29.7 26.0 - 34.0 pg   MCHC 35.5 30.0 - 36.0 g/dL   RDW 13.0 11.5 - 15.5 %   Platelets 287 150 - 400 K/uL    MAU Course  Procedures  MDM Clinical diagnosis of yeast is consistent with lab results.    Assessment and Plan  A: Candidiasis of vagina, diarrhea  P: Discharge to home Terazol Cream BRAT diet Encourage increased hydration Follow up in clinic as needed/as scheduled Patient may return to MAU as needed or if her condition were to change or worsen    Paticia Stack 05/01/2014, 11:39 PM

## 2014-05-01 NOTE — MAU Note (Addendum)
PT  SAYS SHE HAS SHOOTING  PAIN  FROM  VAG  TO UMBILICUS    - STARTED    ON Tuesday.    HAS   VAG  D/C-   STARTED 8 WEEKS  AGO.  [facility] - WITH  FAMILY  [facility]- LAST SEEN -12-29-  TOLD  THEM OF  D/C.-   TURNED  GREEN   ON Tuesday -   WITH ODOR.  NEXT APPOINTMNT   05-21-2014   .    PAIN IS  SAME  NOW AS  AT HOME.  LAST  SEX- NOV

## 2014-05-02 DIAGNOSIS — B373 Candidiasis of vulva and vagina: Secondary | ICD-10-CM | POA: Diagnosis not present

## 2014-05-02 LAB — CBC
HEMATOCRIT: 36.3 % (ref 36.0–46.0)
HEMOGLOBIN: 12.9 g/dL (ref 12.0–15.0)
MCH: 29.7 pg (ref 26.0–34.0)
MCHC: 35.5 g/dL (ref 30.0–36.0)
MCV: 83.4 fL (ref 78.0–100.0)
Platelets: 287 10*3/uL (ref 150–400)
RBC: 4.35 MIL/uL (ref 3.87–5.11)
RDW: 13 % (ref 11.5–15.5)
WBC: 11.8 10*3/uL — AB (ref 4.0–10.5)

## 2014-05-02 LAB — WET PREP, GENITAL
CLUE CELLS WET PREP: NONE SEEN
Trich, Wet Prep: NONE SEEN
Yeast Wet Prep HPF POC: NONE SEEN

## 2014-05-02 MED ORDER — TERCONAZOLE 0.4 % VA CREA: 1.0000 | TOPICAL_CREAM | Freq: Every day | VAGINAL | Status: DC

## 2014-05-02 NOTE — Discharge Instructions (Signed)

## 2014-05-03 LAB — HIV ANTIBODY (ROUTINE TESTING W REFLEX): HIV-1/HIV-2 Ab: NONREACTIVE

## 2014-05-03 LAB — GC/CHLAMYDIA PROBE AMP
CT Probe RNA: NEGATIVE
GC Probe RNA: NEGATIVE

## 2014-05-20 ENCOUNTER — Encounter: Payer: Self-pay | Admitting: Advanced Practice Midwife

## 2014-05-20 ENCOUNTER — Ambulatory Visit (INDEPENDENT_AMBULATORY_CARE_PROVIDER_SITE_OTHER): Payer: 59 | Admitting: Advanced Practice Midwife

## 2014-05-20 ENCOUNTER — Other Ambulatory Visit (HOSPITAL_COMMUNITY)
Admission: RE | Admit: 2014-05-20 | Discharge: 2014-05-20 | Disposition: A | Discharge status: Home / Self Care | Payer: 59 | Source: Ambulatory Visit | Attending: Advanced Practice Midwife | Admitting: Advanced Practice Midwife

## 2014-05-20 VITALS — BP 124/80 | Wt 193.5 lb

## 2014-05-20 DIAGNOSIS — Z1151 Encounter for screening for human papillomavirus (HPV): Secondary | ICD-10-CM | POA: Insufficient documentation

## 2014-05-20 DIAGNOSIS — Z3481 Encounter for supervision of other normal pregnancy, first trimester: Secondary | ICD-10-CM

## 2014-05-20 DIAGNOSIS — Z1389 Encounter for screening for other disorder: Secondary | ICD-10-CM

## 2014-05-20 DIAGNOSIS — R87810 Cervical high risk human papillomavirus (HPV) DNA test positive: Secondary | ICD-10-CM | POA: Diagnosis present

## 2014-05-20 DIAGNOSIS — Z331 Pregnant state, incidental: Secondary | ICD-10-CM

## 2014-05-20 DIAGNOSIS — Z113 Encounter for screening for infections with a predominantly sexual mode of transmission: Secondary | ICD-10-CM | POA: Diagnosis present

## 2014-05-20 DIAGNOSIS — Z01411 Encounter for gynecological examination (general) (routine) with abnormal findings: Secondary | ICD-10-CM | POA: Insufficient documentation

## 2014-05-20 LAB — POCT URINALYSIS DIPSTICK
Glucose, UA: NEGATIVE
KETONES UA: NEGATIVE
Leukocytes, UA: NEGATIVE
Nitrite, UA: NEGATIVE
RBC UA: NEGATIVE

## 2014-05-20 NOTE — Patient Instructions (Signed)
Claritan or zyrtec for allergies

## 2014-05-20 NOTE — Progress Notes (Signed)
Pt states that she had placenta previa with her first pregnancy and she is concerned that she is having some of the same pains. Pt states that her allergies have been botherin as well and is unable to take benadryl and wanted to know if it was ok to take something else.

## 2014-05-20 NOTE — Progress Notes (Signed)
[id number] [redacted]w[redacted]d Estimated Date of Delivery: 12/06/14  Blood pressure 124/80, weight 193 lb 8 oz (87.771 kg), last menstrual period 02/13/2014.   BP weight and urine results all reviewed and noted.  Please refer to the obstetrical flow sheet for the fundal height and fetal heart rate documentation:pap smear collected  Patient denies any bleeding and no rupture of membranes symptoms or regular contractions. Patient still having a lot of nausea All questions were answered.  Plan:  Continued routine obstetrical care, start ASA 81 mg next week.  Discussed non medicinal ways to relieve nausea.   Follow up in 4 weeks for OB appointment, May start Zofran > 1st trimester if still nauseated

## 2014-05-21 LAB — CYTOLOGY - PAP

## 2014-05-28 ENCOUNTER — Encounter: Payer: Self-pay | Admitting: Advanced Practice Midwife

## 2014-05-28 ENCOUNTER — Telehealth: Payer: Self-pay | Admitting: Advanced Practice Midwife

## 2014-05-28 DIAGNOSIS — IMO0002 Reserved for concepts with insufficient information to code with codable children: Secondary | ICD-10-CM | POA: Insufficient documentation

## 2014-05-28 NOTE — Progress Notes (Signed)
  LM that pt needs to schedule colpo >14 weeks  ASCUS/HPV

## 2014-06-11 ENCOUNTER — Ambulatory Visit (INDEPENDENT_AMBULATORY_CARE_PROVIDER_SITE_OTHER): Payer: 59 | Admitting: Obstetrics and Gynecology

## 2014-06-11 ENCOUNTER — Encounter: Payer: Self-pay | Admitting: Obstetrics and Gynecology

## 2014-06-11 VITALS — BP 120/70 | Ht 66.5 in

## 2014-06-11 DIAGNOSIS — Z331 Pregnant state, incidental: Secondary | ICD-10-CM

## 2014-06-11 DIAGNOSIS — IMO0002 Reserved for concepts with insufficient information to code with codable children: Secondary | ICD-10-CM

## 2014-06-11 DIAGNOSIS — Z3492 Encounter for supervision of normal pregnancy, unspecified, second trimester: Secondary | ICD-10-CM

## 2014-06-11 DIAGNOSIS — R87610 Atypical squamous cells of undetermined significance on cytologic smear of cervix (ASC-US): Secondary | ICD-10-CM

## 2014-06-11 DIAGNOSIS — Z1389 Encounter for screening for other disorder: Secondary | ICD-10-CM

## 2014-06-11 NOTE — Progress Notes (Signed)
Patient ID: [place] N Leclere, female   DOB: February 25, 1991, 24 y.o.   MRN: [id number] Pt here today for colpo. Pt states that she has had some left ear pain and fever last night.   [place] N Staszak 24 y.o. [phone] here for colposcopy for ASCUS with POSITIVE high risk HPV pap smear on 04/2014.  Discussed role for HPV in cervical dysplasia, need for surveillance.  Patient given informed consent, signed copy in the chart, time out was performed.  Placed in lithotomy position. Cervix viewed with speculum and colposcope after application of acetic acid.   Colposcopy adequate? Yes  no visible lesions; biopsies obtained at n/a.   ECC specimen obtained.not done, pregnant, adequate view   Colposcopy IMPRESSION: no visible dysplasia Routine prenatal care, pap in 1 yr  Routine preventative health maintenance measures emphasized.   Jonnie Kind

## 2014-06-17 ENCOUNTER — Ambulatory Visit (INDEPENDENT_AMBULATORY_CARE_PROVIDER_SITE_OTHER): Payer: 59 | Admitting: Women's Health

## 2014-06-17 ENCOUNTER — Encounter: Payer: Self-pay | Admitting: Women's Health

## 2014-06-17 VITALS — BP 120/66 | HR 112 | Wt 195.0 lb

## 2014-06-17 DIAGNOSIS — Z363 Encounter for antenatal screening for malformations: Secondary | ICD-10-CM

## 2014-06-17 DIAGNOSIS — IMO0002 Reserved for concepts with insufficient information to code with codable children: Secondary | ICD-10-CM

## 2014-06-17 DIAGNOSIS — Z3492 Encounter for supervision of normal pregnancy, unspecified, second trimester: Secondary | ICD-10-CM

## 2014-06-17 DIAGNOSIS — Z1389 Encounter for screening for other disorder: Secondary | ICD-10-CM

## 2014-06-17 DIAGNOSIS — Z331 Pregnant state, incidental: Secondary | ICD-10-CM

## 2014-06-17 LAB — POCT URINALYSIS DIPSTICK
Glucose, UA: NEGATIVE
Ketones, UA: NEGATIVE
Leukocytes, UA: NEGATIVE
Nitrite, UA: NEGATIVE
PROTEIN UA: NEGATIVE
RBC UA: NEGATIVE

## 2014-06-17 NOTE — Patient Instructions (Signed)
Aquaphor, Eucerin, cocoa butter, over the counter hydrocortisone cream Second Trimester of Pregnancy The second trimester is from week 13 through week 28, months 4 through 6. The second trimester is often a time when you feel your best. Your body has also adjusted to being pregnant, and you begin to feel better physically. Usually, morning sickness has lessened or quit completely, you may have more energy, and you may have an increase in appetite. The second trimester is also a time when the fetus is growing rapidly. At the end of the sixth month, the fetus is about 9 inches long and weighs about 1 pounds. You will likely begin to feel the baby move (quickening) between 18 and 20 weeks of the pregnancy. BODY CHANGES Your body goes through many changes during pregnancy. The changes vary from woman to woman.   Your weight will continue to increase. You will notice your lower abdomen bulging out.  You may begin to get stretch marks on your hips, abdomen, and breasts.  You may develop headaches that can be relieved by medicines approved by your health care provider.  You may urinate more often because the fetus is pressing on your bladder.  You may develop or continue to have heartburn as a result of your pregnancy.  You may develop constipation because certain hormones are causing the muscles that push waste through your intestines to slow down.  You may develop hemorrhoids or swollen, bulging veins (varicose veins).  You may have back pain because of the weight gain and pregnancy hormones relaxing your joints between the bones in your pelvis and as a result of a shift in weight and the muscles that support your balance.  Your breasts will continue to grow and be tender.  Your gums may bleed and may be sensitive to brushing and flossing.  Dark spots or blotches (chloasma, mask of pregnancy) may develop on your face. This will likely fade after the baby is born.  A dark line from your belly  button to the pubic area (linea nigra) may appear. This will likely fade after the baby is born.  You may have changes in your hair. These can include thickening of your hair, rapid growth, and changes in texture. Some women also have hair loss during or after pregnancy, or hair that feels dry or thin. Your hair will most likely return to normal after your baby is born. WHAT TO EXPECT AT YOUR PRENATAL VISITS During a routine prenatal visit:  You will be weighed to make sure you and the fetus are growing normally.  Your blood pressure will be taken.  Your abdomen will be measured to track your baby's growth.  The fetal heartbeat will be listened to.  Any test results from the previous visit will be discussed. Your health care provider may ask you:  How you are feeling.  If you are feeling the baby move.  If you have had any abnormal symptoms, such as leaking fluid, bleeding, severe headaches, or abdominal cramping.  If you have any questions. Other tests that may be performed during your second trimester include:  Blood tests that check for:  Low iron levels (anemia).  Gestational diabetes (between 24 and 28 weeks).  Rh antibodies.  Urine tests to check for infections, diabetes, or protein in the urine.  An ultrasound to confirm the proper growth and development of the baby.  An amniocentesis to check for possible genetic problems.  Fetal screens for spina bifida and Down syndrome. HOME CARE INSTRUCTIONS  Avoid all smoking, herbs, alcohol, and unprescribed drugs. These chemicals affect the formation and growth of the baby.  Follow your health care provider's instructions regarding medicine use. There are medicines that are either safe or unsafe to take during pregnancy.  Exercise only as directed by your health care provider. Experiencing uterine cramps is a good sign to stop exercising.  Continue to eat regular, healthy meals.  Wear a good support bra for breast  tenderness.  Do not use hot tubs, steam rooms, or saunas.  Wear your seat belt at all times when driving.  Avoid raw meat, uncooked cheese, cat litter boxes, and soil used by cats. These carry germs that can cause birth defects in the baby.  Take your prenatal vitamins.  Try taking a stool softener (if your health care provider approves) if you develop constipation. Eat more high-fiber foods, such as fresh vegetables or fruit and whole grains. Drink plenty of fluids to keep your urine clear or pale yellow.  Take warm sitz baths to soothe any pain or discomfort caused by hemorrhoids. Use hemorrhoid cream if your health care provider approves.  If you develop varicose veins, wear support hose. Elevate your feet for 15 minutes, 3-4 times a day. Limit salt in your diet.  Avoid heavy lifting, wear low heel shoes, and practice good posture.  Rest with your legs elevated if you have leg cramps or low back pain.  Visit your dentist if you have not gone yet during your pregnancy. Use a soft toothbrush to brush your teeth and be gentle when you floss.  A sexual relationship may be continued unless your health care provider directs you otherwise.  Continue to go to all your prenatal visits as directed by your health care provider. SEEK MEDICAL CARE IF:   You have dizziness.  You have mild pelvic cramps, pelvic pressure, or nagging pain in the abdominal area.  You have persistent nausea, vomiting, or diarrhea.  You have a bad smelling vaginal discharge.  You have pain with urination. SEEK IMMEDIATE MEDICAL CARE IF:   You have a fever.  You are leaking fluid from your vagina.  You have spotting or bleeding from your vagina.  You have severe abdominal cramping or pain.  You have rapid weight gain or loss.  You have shortness of breath with chest pain.  You notice sudden or extreme swelling of your face, hands, ankles, feet, or legs.  You have not felt your baby move in over an  hour.  You have severe headaches that do not go away with medicine.  You have vision changes. Document Released: 04/05/2001 Document Revised: 04/16/2013 Document Reviewed: 06/12/2012 [facility] Patient Information 2015 [place], [place]. This information is not intended to replace advice given to you by your health care provider. Make sure you discuss any questions you have with your health care provider.

## 2014-06-17 NOTE — Progress Notes (Signed)
Low-risk OB appointment [id number] [redacted]w[redacted]d Estimated Date of Delivery: 12/06/14 BP 120/66 mmHg  Pulse 112  Wt 195 lb (88.451 kg)  LMP 02/13/2014 (Exact Date)  BP, weight, and urine reviewed.  Refer to obstetrical flow sheet for FH & FHR.  No fm yet. Denies cramping, lof, vb, or uti s/s. No complaints. Reviewed warning s/s to report. Plan:  Continue routine obstetrical care  F/U in 4wks for OB appointment and anatomy u/s Declines genetic screening

## 2014-07-15 ENCOUNTER — Encounter: Payer: Self-pay | Admitting: Advanced Practice Midwife

## 2014-07-15 ENCOUNTER — Ambulatory Visit (INDEPENDENT_AMBULATORY_CARE_PROVIDER_SITE_OTHER): Payer: 59 | Admitting: Advanced Practice Midwife

## 2014-07-15 ENCOUNTER — Ambulatory Visit (INDEPENDENT_AMBULATORY_CARE_PROVIDER_SITE_OTHER): Payer: 59

## 2014-07-15 DIAGNOSIS — Z331 Pregnant state, incidental: Secondary | ICD-10-CM

## 2014-07-15 DIAGNOSIS — IMO0002 Reserved for concepts with insufficient information to code with codable children: Secondary | ICD-10-CM

## 2014-07-15 DIAGNOSIS — Z1389 Encounter for screening for other disorder: Secondary | ICD-10-CM

## 2014-07-15 DIAGNOSIS — Z3492 Encounter for supervision of normal pregnancy, unspecified, second trimester: Secondary | ICD-10-CM

## 2014-07-15 DIAGNOSIS — Z36 Encounter for antenatal screening of mother: Secondary | ICD-10-CM

## 2014-07-15 DIAGNOSIS — Z363 Encounter for antenatal screening for malformations: Secondary | ICD-10-CM

## 2014-07-15 LAB — POCT URINALYSIS DIPSTICK
Blood, UA: NEGATIVE
Glucose, UA: NEGATIVE
KETONES UA: NEGATIVE
Leukocytes, UA: NEGATIVE
Nitrite, UA: NEGATIVE
Protein, UA: NEGATIVE

## 2014-07-15 MED ORDER — CYCLOBENZAPRINE HCL 10 MG PO TABS: 10.0000 mg | ORAL_TABLET | Freq: Three times a day (TID) | ORAL | Status: DC | PRN

## 2014-07-15 MED ORDER — DOXYLAMINE-PYRIDOXINE 10-10 MG PO TBEC: DELAYED_RELEASE_TABLET | ORAL | Status: DC

## 2014-07-15 NOTE — Progress Notes (Signed)
[place] [redacted]w[redacted]d , measurements c/w dates,ant pl grade 0,sdp of fluid 4.6cm ,cx appears closed,fht 160bpm,anatomy appears normal,bilat ovs wnl,female

## 2014-07-15 NOTE — Patient Instructions (Signed)
Back Pain in Pregnancy Back pain during pregnancy is common. It happens in about half of all pregnancies. It is important for you and your baby that you remain active during your pregnancy.If you feel that back pain is not allowing you to remain active or sleep well, it is time to see your caregiver. Back pain may be caused by several factors related to changes during your pregnancy.Fortunately, unless you had trouble with your back before your pregnancy, the pain is likely to get better after you deliver. Low back pain usually occurs between the fifth and seventh months of pregnancy. It can, however, happen in the first couple months. Factors that increase the risk of back problems include:   Previous back problems.  Injury to your back.  Having twins or multiple births.  A chronic cough.  Stress.  Job-related repetitive motions.  Muscle or spinal disease in the back.  Family history of back problems, ruptured (herniated) discs, or osteoporosis.  Depression, anxiety, and panic attacks. CAUSES   When you are pregnant, your body produces a hormone called relaxin. This hormonemakes the ligaments connecting the low back and pubic bones more flexible. This flexibility allows the baby to be delivered more easily. When your ligaments are loose, your muscles need to work harder to support your back. Soreness in your back can come from tired muscles. Soreness can also come from back tissues that are irritated since they are receiving less support.  As the baby grows, it puts pressure on the nerves and blood vessels in your pelvis. This can cause back pain.  As the baby grows and gets heavier during pregnancy, the uterus pushes the stomach muscles forward and changes your center of gravity. This makes your back muscles work harder to maintain good posture. SYMPTOMS  Lumbar pain during pregnancy Lumbar pain during pregnancy usually occurs at or above the waist in the center of the back. There  may be pain and numbness that radiates into your leg or foot. This is similar to low back pain experienced by non-pregnant women. It usually increases with sitting for long periods of time, standing, or repetitive lifting. Tenderness may also be present in the muscles along your upper back. Posterior pelvic pain during pregnancy Pain in the back of the pelvis is more common than lumbar pain in pregnancy. It is a deep pain felt in your side at the waistline, or across the tailbone (sacrum), or in both places. You may have pain on one or both sides. This pain can also go into the buttocks and backs of the upper thighs. Pubic and groin pain may also be present. The pain does not quickly resolve with rest, and morning stiffness may also be present. Pelvic pain during pregnancy can be brought on by most activities. A high level of fitness before and during pregnancy may or may not prevent this problem. Labor pain is usually 1 to 2 minutes apart, lasts for about 1 minute, and involves a bearing down feeling or pressure in your pelvis. However, if you are at term with the pregnancy, constant low back pain can be the beginning of early labor, and you should be aware of this. DIAGNOSIS  X-rays of the back should not be done during the first 12 to 14 weeks of the pregnancy and only when absolutely necessary during the rest of the pregnancy. MRIs do not give off radiation and are safe during pregnancy. MRIs also should only be done when absolutely necessary. HOME CARE INSTRUCTIONS  Exercise   as directed by your caregiver. Exercise is the most effective way to prevent or manage back pain. If you have a back problem, it is especially important to avoid sports that require sudden body movements. Swimming and walking are great activities.  Do not stand in one place for long periods of time.  Do not wear high heels.  Sit in chairs with good posture. Use a pillow on your lower back if necessary. Make sure your head  rests over your shoulders and is not hanging forward.  Try sleeping on your side, preferably the left side, with a pillow or two between your legs. If you are sore after a night's rest, your bedmay betoo soft.Try placing a board between your mattress and box spring.  Listen to your body when lifting.If you are experiencing pain, ask for help or try bending yourknees more so you can use your leg muscles rather than your back muscles. Squat down when picking up something from the floor. Do not bend over.  Eat a healthy diet. Try to gain weight within your caregiver's recommendations.  Use heat or cold packs 3 to 4 times a day for 15 minutes to help with the pain.  Only take over-the-counter or prescription medicines for pain, discomfort, or fever as directed by your caregiver. Sudden (acute) back pain  Use bed rest for only the most extreme, acute episodes of back pain. Prolonged bed rest over 48 hours will aggravate your condition.  Ice is very effective for acute conditions.  Put ice in a plastic bag.  Place a towel between your skin and the bag.  Leave the ice on for 10 to 20 minutes every 2 hours, or as needed.  Using heat packs for 30 minutes prior to activities is also helpful. Continued back pain See your caregiver if you have continued problems. Your caregiver can help or refer you for appropriate physical therapy. With conditioning, most back problems can be avoided. Sometimes, a more serious issue may be the cause of back pain. You should be seen right away if new problems seem to be developing. Your caregiver may recommend:  A maternity girdle.  An elastic sling.  A back brace.  A massage therapist or acupuncture. SEEK MEDICAL CARE IF:   You are not able to do most of your daily activities, even when taking the pain medicine you were given.  You need a referral to a physical therapist or chiropractor.  You want to try acupuncture. SEEK IMMEDIATE MEDICAL CARE  IF:  You develop numbness, tingling, weakness, or problems with the use of your arms or legs.  You develop severe back pain that is no longer relieved with medicines.  You have a sudden change in bowel or bladder control.  You have increasing pain in other areas of the body.  You develop shortness of breath, dizziness, or fainting.  You develop nausea, vomiting, or sweating.  You have back pain which is similar to labor pains.  You have back pain along with your water breaking or vaginal bleeding.  You have back pain or numbness that travels down your leg.  Your back pain developed after you fell.  You develop pain on one side of your back. You may have a kidney stone.  You see blood in your urine. You may have a bladder infection or kidney stone.  You have back pain with blisters. You may have shingles. Back pain is fairly common during pregnancy but should not be accepted as just part of   the process. Back pain should always be treated as soon as possible. This will make your pregnancy as pleasant as possible. Document Released: 07/20/2005 Document Revised: 07/04/2011 Document Reviewed: 08/31/2010 [facility] Patient Information 2015 [place], [place]. This information is not intended to replace advice given to you by your health care provider. Make sure you discuss any questions you have with your health care provider.   Sciatica with Rehab The sciatic nerve runs from the back down the leg and is responsible for sensation and control of the muscles in the back (posterior) side of the thigh, lower leg, and foot. Sciatica is a condition that is characterized by inflammation of this nerve.  SYMPTOMS   Signs of nerve damage, including numbness and/or weakness along the posterior side of the lower extremity.  Pain in the back of the thigh that may also travel down the leg.  Pain that worsens when sitting for long periods of time.  Occasionally, pain in the back or buttock. CAUSES   Inflammation of the sciatic nerve is the cause of sciatica. The inflammation is due to something irritating the nerve. Common sources of irritation include:  Sitting for long periods of time.  Direct trauma to the nerve.  Arthritis of the spine.  Herniated or ruptured disk.  Slipping of the vertebrae (spondylolisthesis).  Pressure from soft tissues, such as muscles or ligament-like tissue (fascia). RISK INCREASES WITH:  Sports that place pressure or stress on the spine (football or weightlifting).  Poor strength and flexibility.  Failure to warm up properly before activity.  Family history of low back pain or disk disorders.  Previous back injury or surgery.  Poor body mechanics, especially when lifting, or poor posture. PREVENTION   Warm up and stretch properly before activity.  Maintain physical fitness:  Strength, flexibility, and endurance.  Cardiovascular fitness.  Learn and use proper technique, especially with posture and lifting. When possible, have coach correct improper technique.  Avoid activities that place stress on the spine. PROGNOSIS If treated properly, then sciatica usually resolves within 6 weeks. However, occasionally surgery is necessary.  RELATED COMPLICATIONS   Permanent nerve damage, including pain, numbness, tingle, or weakness.  Chronic back pain.  Risks of surgery: infection, bleeding, nerve damage, or damage to surrounding tissues. TREATMENT Treatment initially involves resting from any activities that aggravate your symptoms. The use of ice and medication may help reduce pain and inflammation. The use of strengthening and stretching exercises may help reduce pain with activity. These exercises may be performed at home or with referral to a therapist. A therapist may recommend further treatments, such as transcutaneous electronic nerve stimulation (TENS) or ultrasound. Your caregiver may recommend corticosteroid injections to help reduce  inflammation of the sciatic nerve. If symptoms persist despite non-surgical (conservative) treatment, then surgery may be recommended. MEDICATION  If pain medication is necessary, then nonsteroidal anti-inflammatory medications, such as aspirin and ibuprofen, or other minor pain relievers, such as acetaminophen, are often recommended.  Do not take pain medication for 7 days before surgery.  Prescription pain relievers may be given if deemed necessary by your caregiver. Use only as directed and only as much as you need.  Ointments applied to the skin may be helpful.  Corticosteroid injections may be given by your caregiver. These injections should be reserved for the most serious cases, because they may only be given a certain number of times. HEAT AND COLD  Cold treatment (icing) relieves pain and reduces inflammation. Cold treatment should be applied for 10 to  15 minutes every 2 to 3 hours for inflammation and pain and immediately after any activity that aggravates your symptoms. Use ice packs or massage the area with a piece of ice (ice massage).  Heat treatment may be used prior to performing the stretching and strengthening activities prescribed by your caregiver, physical therapist, or athletic trainer. Use a heat pack or soak the injury in warm water. SEEK MEDICAL CARE IF:  Treatment seems to offer no benefit, or the condition worsens.  Any medications produce adverse side effects. EXERCISES  RANGE OF MOTION (ROM) AND STRETCHING EXERCISES - Sciatica Most people with sciatic will find that their symptoms worsen with either excessive bending forward (flexion) or arching at the low back (extension). The exercises which will help resolve your symptoms will focus on the opposite motion. Your physician, physical therapist or athletic trainer will help you determine which exercises will be most helpful to resolve your low back pain. Do not complete any exercises without first consulting with  your clinician. Discontinue any exercises which worsen your symptoms until you speak to your clinician. If you have pain, numbness or tingling which travels down into your buttocks, leg or foot, the goal of the therapy is for these symptoms to move closer to your back and eventually resolve. Occasionally, these leg symptoms will get better, but your low back pain may worsen; this is typically an indication of progress in your rehabilitation. Be certain to be very alert to any changes in your symptoms and the activities in which you participated in the 24 hours prior to the change. Sharing this information with your clinician will allow him/her to most efficiently treat your condition. These exercises may help you when beginning to rehabilitate your injury. Your symptoms may resolve with or without further involvement from your physician, physical therapist or athletic trainer. While completing these exercises, remember:   Restoring tissue flexibility helps normal motion to return to the joints. This allows healthier, less painful movement and activity.  An effective stretch should be held for at least 30 seconds.  A stretch should never be painful. You should only feel a gentle lengthening or release in the stretched tissue. FLEXION RANGE OF MOTION AND STRETCHING EXERCISES: STRETCH - Flexion, Single Knee to Chest   Lie on a firm bed or floor with both legs extended in front of you.  Keeping one leg in contact with the floor, bring your opposite knee to your chest. Hold your leg in place by either grabbing behind your thigh or at your knee.  Pull until you feel a gentle stretch in your low back. Hold __________ seconds.  Slowly release your grasp and repeat the exercise with the opposite side. Repeat __________ times. Complete this exercise __________ times per day.  STRETCH - Flexion, Double Knee to Chest  Lie on a firm bed or floor with both legs extended in front of you.  Keeping one leg in  contact with the floor, bring your opposite knee to your chest.  Tense your stomach muscles to support your back and then lift your other knee to your chest. Hold your legs in place by either grabbing behind your thighs or at your knees.  Pull both knees toward your chest until you feel a gentle stretch in your low back. Hold __________ seconds.  Tense your stomach muscles and slowly return one leg at a time to the floor. Repeat __________ times. Complete this exercise __________ times per day.  STRETCH - Low Trunk Rotation  Lie on a firm bed or floor. Keeping your legs in front of you, bend your knees so they are both pointed toward the ceiling and your feet are flat on the floor.  Extend your arms out to the side. This will stabilize your upper body by keeping your shoulders in contact with the floor.  Gently and slowly drop both knees together to one side until you feel a gentle stretch in your low back. Hold for __________ seconds.  Tense your stomach muscles to support your low back as you bring your knees back to the starting position. Repeat the exercise to the other side. Repeat __________ times. Complete this exercise __________ times per day  EXTENSION RANGE OF MOTION AND FLEXIBILITY EXERCISES: STRETCH - Extension, Prone on Elbows  Lie on your stomach on the floor, a bed will be too soft. Place your palms about shoulder width apart and at the height of your head.  Place your elbows under your shoulders. If this is too painful, stack pillows under your chest.  Allow your body to relax so that your hips drop lower and make contact more completely with the floor.  Hold this position for __________ seconds.  Slowly return to lying flat on the floor. Repeat __________ times. Complete this exercise __________ times per day.  RANGE OF MOTION - Extension, Prone Press Ups  Lie on your stomach on the floor, a bed will be too soft. Place your palms about shoulder width apart and at  the height of your head.  Keeping your back as relaxed as possible, slowly straighten your elbows while keeping your hips on the floor. You may adjust the placement of your hands to maximize your comfort. As you gain motion, your hands will come more underneath your shoulders.  Hold this position __________ seconds.  Slowly return to lying flat on the floor. Repeat __________ times. Complete this exercise __________ times per day.  STRENGTHENING EXERCISES - Sciatica  These exercises may help you when beginning to rehabilitate your injury. These exercises should be done near your "sweet spot." This is the neutral, low-back arch, somewhere between fully rounded and fully arched, that is your least painful position. When performed in this safe range of motion, these exercises can be used for people who have either a flexion or extension based injury. These exercises may resolve your symptoms with or without further involvement from your physician, physical therapist or athletic trainer. While completing these exercises, remember:   Muscles can gain both the endurance and the strength needed for everyday activities through controlled exercises.  Complete these exercises as instructed by your physician, physical therapist or athletic trainer. Progress with the resistance and repetition exercises only as your caregiver advises.  You may experience muscle soreness or fatigue, but the pain or discomfort you are trying to eliminate should never worsen during these exercises. If this pain does worsen, stop and make certain you are following the directions exactly. If the pain is still present after adjustments, discontinue the exercise until you can discuss the trouble with your clinician. STRENGTHENING - Deep Abdominals, Pelvic Tilt   Lie on a firm bed or floor. Keeping your legs in front of you, bend your knees so they are both pointed toward the ceiling and your feet are flat on the floor.  Tense your  lower abdominal muscles to press your low back into the floor. This motion will rotate your pelvis so that your tail bone is scooping upwards rather than pointing at your  feet or into the floor.  With a gentle tension and even breathing, hold this position for __________ seconds. Repeat __________ times. Complete this exercise __________ times per day.  STRENGTHENING - Abdominals, Crunches   Lie on a firm bed or floor. Keeping your legs in front of you, bend your knees so they are both pointed toward the ceiling and your feet are flat on the floor. Cross your arms over your chest.  Slightly tip your chin down without bending your neck.  Tense your abdominals and slowly lift your trunk high enough to just clear your shoulder blades. Lifting higher can put excessive stress on the low back and does not further strengthen your abdominal muscles.  Control your return to the starting position. Repeat __________ times. Complete this exercise __________ times per day.  STRENGTHENING - Quadruped, Opposite UE/LE Lift  Assume a hands and knees position on a firm surface. Keep your hands under your shoulders and your knees under your hips. You may place padding under your knees for comfort.  Find your neutral spine and gently tense your abdominal muscles so that you can maintain this position. Your shoulders and hips should form a rectangle that is parallel with the floor and is not twisted.  Keeping your trunk steady, lift your right hand no higher than your shoulder and then your left leg no higher than your hip. Make sure you are not holding your breath. Hold this position __________ seconds.  Continuing to keep your abdominal muscles tense and your back steady, slowly return to your starting position. Repeat with the opposite arm and leg. Repeat __________ times. Complete this exercise __________ times per day.  STRENGTHENING - Abdominals and Quadriceps, Straight Leg Raise   Lie on a firm bed or  floor with both legs extended in front of you.  Keeping one leg in contact with the floor, bend the other knee so that your foot can rest flat on the floor.  Find your neutral spine, and tense your abdominal muscles to maintain your spinal position throughout the exercise.  Slowly lift your straight leg off the floor about 6 inches for a count of 15, making sure to not hold your breath.  Still keeping your neutral spine, slowly lower your leg all the way to the floor. Repeat this exercise with each leg __________ times. Complete this exercise __________ times per day. POSTURE AND BODY MECHANICS CONSIDERATIONS - Sciatica Keeping correct posture when sitting, standing or completing your activities will reduce the stress put on different body tissues, allowing injured tissues a chance to heal and limiting painful experiences. The following are general guidelines for improved posture. Your physician or physical therapist will provide you with any instructions specific to your needs. While reading these guidelines, remember:  The exercises prescribed by your provider will help you have the flexibility and strength to maintain correct postures.  The correct posture provides the optimal environment for your joints to work. All of your joints have less wear and tear when properly supported by a spine with good posture. This means you will experience a healthier, less painful body.  Correct posture must be practiced with all of your activities, especially prolonged sitting and standing. Correct posture is as important when doing repetitive low-stress activities (typing) as it is when doing a single heavy-load activity (lifting). RESTING POSITIONS Consider which positions are most painful for you when choosing a resting position. If you have pain with flexion-based activities (sitting, bending, stooping, squatting), choose a position that  allows you to rest in a less flexed posture. You would want to avoid  curling into a fetal position on your side. If your pain worsens with extension-based activities (prolonged standing, working overhead), avoid resting in an extended position such as sleeping on your stomach. Most people will find more comfort when they rest with their spine in a more neutral position, neither too rounded nor too arched. Lying on a non-sagging bed on your side with a pillow between your knees, or on your back with a pillow under your knees will often provide some relief. Keep in mind, being in any one position for a prolonged period of time, no matter how correct your posture, can still lead to stiffness. PROPER SITTING POSTURE In order to minimize stress and discomfort on your spine, you must sit with correct posture Sitting with good posture should be effortless for a healthy body. Returning to good posture is a gradual process. Many people can work toward this most comfortably by using various supports until they have the flexibility and strength to maintain this posture on their own. When sitting with proper posture, your ears will fall over your shoulders and your shoulders will fall over your hips. You should use the back of the chair to support your upper back. Your low back will be in a neutral position, just slightly arched. You may place a small pillow or folded towel at the base of your low back for support.  When working at a desk, create an environment that supports good, upright posture. Without extra support, muscles fatigue and lead to excessive strain on joints and other tissues. Keep these recommendations in mind: CHAIR:   A chair should be able to slide under your desk when your back makes contact with the back of the chair. This allows you to work closely.  The chair's height should allow your eyes to be level with the upper part of your monitor and your hands to be slightly lower than your elbows. BODY POSITION  Your feet should make contact with the floor. If this  is not possible, use a foot rest.  Keep your ears over your shoulders. This will reduce stress on your neck and low back. INCORRECT SITTING POSTURES   If you are feeling tired and unable to assume a healthy sitting posture, do not slouch or slump. This puts excessive strain on your back tissues, causing more damage and pain. Healthier options include:  Using more support, like a lumbar pillow.  Switching tasks to something that requires you to be upright or walking.  Talking a brief walk.  Lying down to rest in a neutral-spine position. PROLONGED STANDING WHILE SLIGHTLY LEANING FORWARD  When completing a task that requires you to lean forward while standing in one place for a long time, place either foot up on a stationary 2-4 inch high object to help maintain the best posture. When both feet are on the ground, the low back tends to lose its slight inward curve. If this curve flattens (or becomes too large), then the back and your other joints will experience too much stress, fatigue more quickly and can cause pain.  CORRECT STANDING POSTURES Proper standing posture should be assumed with all daily activities, even if they only take a few moments, like when brushing your teeth. As in sitting, your ears should fall over your shoulders and your shoulders should fall over your hips. You should keep a slight tension in your abdominal muscles to brace your spine.  Your tailbone should point down to the ground, not behind your body, resulting in an over-extended swayback posture.  INCORRECT STANDING POSTURES  Common incorrect standing postures include a forward head, locked knees and/or an excessive swayback. WALKING Walk with an upright posture. Your ears, shoulders and hips should all line-up. PROLONGED ACTIVITY IN A FLEXED POSITION When completing a task that requires you to bend forward at your waist or lean over a low surface, try to find a way to stabilize 3 of 4 of your limbs. You can place a  hand or elbow on your thigh or rest a knee on the surface you are reaching across. This will provide you more stability so that your muscles do not fatigue as quickly. By keeping your knees relaxed, or slightly bent, you will also reduce stress across your low back. CORRECT LIFTING TECHNIQUES DO :   Assume a wide stance. This will provide you more stability and the opportunity to get as close as possible to the object which you are lifting.  Tense your abdominals to brace your spine; then bend at the knees and hips. Keeping your back locked in a neutral-spine position, lift using your leg muscles. Lift with your legs, keeping your back straight.  Test the weight of unknown objects before attempting to lift them.  Try to keep your elbows locked down at your sides in order get the best strength from your shoulders when carrying an object.  Always ask for help when lifting heavy or awkward objects. INCORRECT LIFTING TECHNIQUES DO NOT:   Lock your knees when lifting, even if it is a small object.  Bend and twist. Pivot at your feet or move your feet when needing to change directions.  Assume that you cannot safely pick up a paperclip without proper posture. Document Released: 04/11/2005 Document Revised: 08/26/2013 Document Reviewed: 07/24/2008 [facility] Patient Information 2015 [place], [place]. This information is not intended to replace advice given to you by your health care provider. Make sure you discuss any questions you have with your health care provider.

## 2014-07-15 NOTE — Progress Notes (Signed)
Pt states that she has back at night. Pt states that she is not sleeping very well due to the back pain. Pt states that she has a red area on her right breast under her arm.

## 2014-07-15 NOTE — Progress Notes (Signed)
[id number] [redacted]w[redacted]d Estimated Date of Delivery: 12/06/14  Blood pressure 112/60, pulse 100, weight 195 lb 8 oz (88.678 kg), last menstrual period 02/13/2014.   BP weight and urine results all reviewed and noted.  Please refer to the obstetrical flow sheet for the fundal height and fetal heart rate documentation:  Patient reports good fetal movement, denies any bleeding and no rupture of membranes symptoms or regular contractions. Patient is without complaints.other than normal pregnancy complaints. All questions were answered.  Plan:  Continued routine obstetrical care,   Follow up in 4 weeks for OB appointment,

## 2014-07-16 ENCOUNTER — Ambulatory Visit (INDEPENDENT_AMBULATORY_CARE_PROVIDER_SITE_OTHER): Payer: 59 | Admitting: Obstetrics and Gynecology

## 2014-07-16 ENCOUNTER — Encounter: Payer: Self-pay | Admitting: Obstetrics and Gynecology

## 2014-07-16 DIAGNOSIS — Z3402 Encounter for supervision of normal first pregnancy, second trimester: Secondary | ICD-10-CM

## 2014-07-16 DIAGNOSIS — Z331 Pregnant state, incidental: Secondary | ICD-10-CM

## 2014-07-16 DIAGNOSIS — Z1389 Encounter for screening for other disorder: Secondary | ICD-10-CM

## 2014-07-16 LAB — POCT URINALYSIS DIPSTICK
Glucose, UA: NEGATIVE
Leukocytes, UA: NEGATIVE
Nitrite, UA: NEGATIVE
PROTEIN UA: NEGATIVE
RBC UA: NEGATIVE

## 2014-07-16 MED ORDER — ONDANSETRON 4 MG PO TBDP: 4.0000 mg | ORAL_TABLET | Freq: Four times a day (QID) | ORAL | Status: DC | PRN

## 2014-07-16 NOTE — Progress Notes (Signed)
Pt worked in today for severe abdominal pain under her ribs. Pt states that she has had the pain since last night.

## 2014-07-16 NOTE — Progress Notes (Signed)
Patient ID: Dana Mcpherson, female   DOB: [birth year], 24 y.o.   MRN: [id number]  This chart was SCRIBED for Mallory Shirk, MD by Stephania Fragmin, ED Scribe. This patient was seen in room 1 and the patient's care was started at 2:05 PM.  [place] [redacted]w[redacted]d Estimated Date of Delivery: 12/06/14  Blood pressure 122/76, weight 193 lb (87.544 kg), last menstrual period 02/13/2014.   refer to the ob flow sheet for FH and FHR 154, also BP, Wt, Urine results:notable for negative  Patient reports  + good fetal movement, denies any bleeding and no rupture of membranes symptoms or regular contractions. Patient complaints: Dana Mcpherson is a 24 y.o. female presenting today for constant epigastric pain since last night. She complain sof associated nausea and multiple episodes of vomiting; her last episode of vomiting occurred about 11 hours ago, at 3 AM--she drank water since, which she was able to tolerate. She notes abdominal tightening last night which has since resolved. She is unsure whether she had a fever.  Patient took Gas-X without relief. She denies diarrhea.   FHR: 154  Physical Exam  Abdominal: Soft. There is no rebound and no guarding.  bowel sound active, normal pitch, and frequency   Questions were answered. Assessment:  Plan:  Continued routine obstetrical care, discussed BRAT diet, can Rx Zofran  F/u in as scheduled for f/u  I personally performed the services described in this documentation, which was SCRIBED in my presence. The recorded information has been reviewed and considered accurate. It has been edited as necessary during review. Jonnie Kind, MD

## 2014-08-12 ENCOUNTER — Encounter: Payer: Self-pay | Admitting: Women's Health

## 2014-08-12 ENCOUNTER — Ambulatory Visit (INDEPENDENT_AMBULATORY_CARE_PROVIDER_SITE_OTHER): Payer: 59 | Admitting: Women's Health

## 2014-08-12 VITALS — BP 126/70 | HR 120 | Wt 199.5 lb

## 2014-08-12 DIAGNOSIS — Z3492 Encounter for supervision of normal pregnancy, unspecified, second trimester: Secondary | ICD-10-CM

## 2014-08-12 DIAGNOSIS — Z331 Pregnant state, incidental: Secondary | ICD-10-CM

## 2014-08-12 DIAGNOSIS — Z1389 Encounter for screening for other disorder: Secondary | ICD-10-CM

## 2014-08-12 LAB — POCT URINALYSIS DIPSTICK
Glucose, UA: NEGATIVE
Ketones, UA: NEGATIVE
LEUKOCYTES UA: NEGATIVE
Nitrite, UA: NEGATIVE
RBC UA: NEGATIVE

## 2014-08-12 NOTE — Progress Notes (Signed)
Low-risk OB appointment [id number] [redacted]w[redacted]d Estimated Date of Delivery: 12/06/14 BP 126/70 mmHg  Pulse 120  Wt 199 lb 8 oz (90.493 kg)  LMP 02/13/2014 (Exact Date)  BP, weight, and urine reviewed.  Refer to obstetrical flow sheet for FH & FHR.  Reports good fm.  Denies regular uc's, lof, vb, or uti s/s. Sharp RLQ pain for last few days, no fever/chills, n/v/d. Sounds like RLP although lasts longer. Abd exam normal and non-tender.  Reviewed ptl s/s, fm. Plan:  Continue routine obstetrical care  F/U in 4wks for OB appointment and pn2

## 2014-08-12 NOTE — Progress Notes (Signed)
Pt states that she has had a tearing pain in her lower right side for the past three days.

## 2014-08-12 NOTE — Patient Instructions (Signed)
You will have your sugar test next visit.  Please do not eat or drink anything after midnight the night before you come, not even water.  You will be here for at least two hours.     Call the office [phone]) or go to [facility] if:  You begin to have strong, frequent contractions  Your water breaks.  Sometimes it is a big gush of fluid, sometimes it is just a trickle that keeps getting your panties wet or running down your legs  You have vaginal bleeding.  It is normal to have a small amount of spotting if your cervix was checked.   You don't feel your baby moving like normal.  If you don't, get you something to eat and drink and lay down and focus on feeling your baby move.   If your baby is still not moving like normal, you should call the office or go to [facility] Pregnancy The second trimester is from week 13 through week 28, months 4 through 6. The second trimester is often a time when you feel your best. Your body has also adjusted to being pregnant, and you begin to feel better physically. Usually, morning sickness has lessened or quit completely, you may have more energy, and you may have an increase in appetite. The second trimester is also a time when the fetus is growing rapidly. At the end of the sixth month, the fetus is about 9 inches long and weighs about 1 pounds. You will likely begin to feel the baby move (quickening) between 18 and 20 weeks of the pregnancy. BODY CHANGES Your body goes through many changes during pregnancy. The changes vary from woman to woman.   Your weight will continue to increase. You will notice your lower abdomen bulging out.  You may begin to get stretch marks on your hips, abdomen, and breasts.  You may develop headaches that can be relieved by medicines approved by your health care provider.  You may urinate more often because the fetus is pressing on your bladder.  You may develop or continue to have  heartburn as a result of your pregnancy.  You may develop constipation because certain hormones are causing the muscles that push waste through your intestines to slow down.  You may develop hemorrhoids or swollen, bulging veins (varicose veins).  You may have back pain because of the weight gain and pregnancy hormones relaxing your joints between the bones in your pelvis and as a result of a shift in weight and the muscles that support your balance.  Your breasts will continue to grow and be tender.  Your gums may bleed and may be sensitive to brushing and flossing.  Dark spots or blotches (chloasma, mask of pregnancy) may develop on your face. This will likely fade after the baby is born.  A dark line from your belly button to the pubic area (linea nigra) may appear. This will likely fade after the baby is born.  You may have changes in your hair. These can include thickening of your hair, rapid growth, and changes in texture. Some women also have hair loss during or after pregnancy, or hair that feels dry or thin. Your hair will most likely return to normal after your baby is born. WHAT TO EXPECT AT YOUR PRENATAL VISITS During a routine prenatal visit:  You will be weighed to make sure you and the fetus are growing normally.  Your blood pressure will be taken.  Your abdomen will be measured to track your baby's growth.  The fetal heartbeat will be listened to.  Any test results from the previous visit will be discussed. Your health care provider may ask you:  How you are feeling.  If you are feeling the baby move.  If you have had any abnormal symptoms, such as leaking fluid, bleeding, severe headaches, or abdominal cramping.  If you have any questions. Other tests that may be performed during your second trimester include:  Blood tests that check for:  Low iron levels (anemia).  Gestational diabetes (between 24 and 28 weeks).  Rh antibodies.  Urine tests to check  for infections, diabetes, or protein in the urine.  An ultrasound to confirm the proper growth and development of the baby.  An amniocentesis to check for possible genetic problems.  Fetal screens for spina bifida and Down syndrome. HOME CARE INSTRUCTIONS   Avoid all smoking, herbs, alcohol, and unprescribed drugs. These chemicals affect the formation and growth of the baby.  Follow your health care provider's instructions regarding medicine use. There are medicines that are either safe or unsafe to take during pregnancy.  Exercise only as directed by your health care provider. Experiencing uterine cramps is a good sign to stop exercising.  Continue to eat regular, healthy meals.  Wear a good support bra for breast tenderness.  Do not use hot tubs, steam rooms, or saunas.  Wear your seat belt at all times when driving.  Avoid raw meat, uncooked cheese, cat litter boxes, and soil used by cats. These carry germs that can cause birth defects in the baby.  Take your prenatal vitamins.  Try taking a stool softener (if your health care provider approves) if you develop constipation. Eat more high-fiber foods, such as fresh vegetables or fruit and whole grains. Drink plenty of fluids to keep your urine clear or pale yellow.  Take warm sitz baths to soothe any pain or discomfort caused by hemorrhoids. Use hemorrhoid cream if your health care provider approves.  If you develop varicose veins, wear support hose. Elevate your feet for 15 minutes, 3-4 times a day. Limit salt in your diet.  Avoid heavy lifting, wear low heel shoes, and practice good posture.  Rest with your legs elevated if you have leg cramps or low back pain.  Visit your dentist if you have not gone yet during your pregnancy. Use a soft toothbrush to brush your teeth and be gentle when you floss.  A sexual relationship may be continued unless your health care provider directs you otherwise.  Continue to go to all your  prenatal visits as directed by your health care provider. SEEK MEDICAL CARE IF:   You have dizziness.  You have mild pelvic cramps, pelvic pressure, or nagging pain in the abdominal area.  You have persistent nausea, vomiting, or diarrhea.  You have a bad smelling vaginal discharge.  You have pain with urination. SEEK IMMEDIATE MEDICAL CARE IF:   You have a fever.  You are leaking fluid from your vagina.  You have spotting or bleeding from your vagina.  You have severe abdominal cramping or pain.  You have rapid weight gain or loss.  You have shortness of breath with chest pain.  You notice sudden or extreme swelling of your face, hands, ankles, feet, or legs.  You have not felt your baby move in over an hour.  You have severe headaches that do not go away with medicine.  You have vision changes.  Document Released: 04/05/2001 Document Revised: 04/16/2013 Document Reviewed: 06/12/2012 [employer] Patient Information 2015 [employer]. This information is not intended to replace advice given to you by your health care provider. Make sure you discuss any questions you have with your health care provider.     

## 2014-08-17 ENCOUNTER — Inpatient Hospital Stay (HOSPITAL_COMMUNITY)
Admission: AD | Admit: 2014-08-17 | Discharge: 2014-08-17 | Disposition: A | Discharge status: Home / Self Care | Payer: 59 | Source: Ambulatory Visit | Attending: Obstetrics & Gynecology | Admitting: Obstetrics & Gynecology

## 2014-08-17 ENCOUNTER — Encounter (HOSPITAL_COMMUNITY): Payer: Self-pay

## 2014-08-17 DIAGNOSIS — O368120 Decreased fetal movements, second trimester, not applicable or unspecified: Secondary | ICD-10-CM | POA: Diagnosis present

## 2014-08-17 DIAGNOSIS — K219 Gastro-esophageal reflux disease without esophagitis: Secondary | ICD-10-CM | POA: Insufficient documentation

## 2014-08-17 DIAGNOSIS — Z87891 Personal history of nicotine dependence: Secondary | ICD-10-CM | POA: Insufficient documentation

## 2014-08-17 DIAGNOSIS — Z3A24 24 weeks gestation of pregnancy: Secondary | ICD-10-CM | POA: Diagnosis not present

## 2014-08-17 DIAGNOSIS — O99612 Diseases of the digestive system complicating pregnancy, second trimester: Secondary | ICD-10-CM | POA: Diagnosis not present

## 2014-08-17 LAB — URINALYSIS, ROUTINE W REFLEX MICROSCOPIC
BILIRUBIN URINE: NEGATIVE
GLUCOSE, UA: NEGATIVE mg/dL
HGB URINE DIPSTICK: NEGATIVE
Ketones, ur: NEGATIVE mg/dL
Nitrite: NEGATIVE
PH: 6 (ref 5.0–8.0)
Protein, ur: NEGATIVE mg/dL
Specific Gravity, Urine: 1.025 (ref 1.005–1.030)
Urobilinogen, UA: 0.2 mg/dL (ref 0.0–1.0)

## 2014-08-17 LAB — URINE MICROSCOPIC-ADD ON

## 2014-08-17 MED ORDER — OMEPRAZOLE 20 MG PO CPDR: 20.0000 mg | DELAYED_RELEASE_CAPSULE | Freq: Two times a day (BID) | ORAL | Status: DC

## 2014-08-17 MED ORDER — GI COCKTAIL ~~LOC~~
30.0000 mL | Freq: Once | ORAL | Status: AC
  Administered 2014-08-17: 30 mL via ORAL
  Filled 2014-08-17: qty 30

## 2014-08-17 NOTE — MAU Provider Note (Signed)
History   [id number] at 24.1 wks in wih c/o decreased fetal movement and burning epigastric pain.  CSN: [id number]  Arrival date and time: 08/17/14 1257   None     Chief Complaint  Patient presents with  . Decreased Fetal Movement  . Chest Pain   HPI  OB History    Gravida Para Term Preterm AB TAB SAB Ectopic Multiple Living   3 1 1  1  1   1       Obstetric Comments   IOL for pre-e      Past Medical History  Diagnosis Date  . Allergy   . Migraine headache   . Polycystic ovarian syndrome   . Pregnancy induced hypertension   . Complication of anesthesia   . Vaginal Pap smear, abnormal     Past Surgical History  Procedure Laterality Date  . Cholecystectomy      Family History  Problem Relation Age of Onset  . Cancer Paternal Grandmother     ovarian cancer    History  Substance Use Topics  . Smoking status: Former Research scientist (life sciences)  . Smokeless tobacco: Never Used  . Alcohol Use: No     Comment: occasional    Allergies:  Allergies  Allergen Reactions  . Other Anaphylaxis    Axe and Tag body spray, lysol  . Shellfish Allergy Anaphylaxis and Hives  . Eggs Or Egg-Derived Products Itching and Nausea Only    Prescriptions prior to admission  Medication Sig Dispense Refill Last Dose  . aspirin 81 MG tablet Take 81 mg by mouth daily.   08/16/2014 at Unknown time  . calcium carbonate (TUMS - DOSED IN MG ELEMENTAL CALCIUM) 500 MG chewable tablet Chew 1 tablet by mouth daily.   08/16/2014 at Unknown time  . cyclobenzaprine (FLEXERIL) 10 MG tablet Take 1 tablet (10 mg total) by mouth every 8 (eight) hours as needed for muscle spasms. 30 tablet 1 08/16/2014 at Unknown time  . Doxylamine-Pyridoxine (DICLEGIS) 10-10 MG TBEC Take 2 tablets by mouth at bedtime. May take an additional tablet in the morning and an additional tablet in the afternoon prn nausea 120 tablet 1 08/16/2014 at Unknown time  . Prenat-FeFum-FePo-FA-Omega 3 (CONCEPT DHA) 53.5-38-1 MG CAPS Take 1 capsule by mouth  daily. 30 capsule 11 08/16/2014 at Unknown time  . simethicone (MYLICON) 700 MG chewable tablet Chew 125 mg by mouth every 6 (six) hours as needed for flatulence.   08/16/2014 at Unknown time    Review of Systems  Constitutional: Negative.   Eyes: Negative.   Respiratory: Negative.   Cardiovascular: Negative.   Gastrointestinal: Positive for heartburn.  Genitourinary: Negative.   Musculoskeletal: Negative.   Skin: Negative.   Neurological: Negative.   Endo/Heme/Allergies: Negative.   Psychiatric/Behavioral: Negative.    Physical Exam   Blood pressure 135/82, pulse 118, temperature 98.2 F (36.8 C), resp. rate 18, height 5\' 5"  (1.651 m), weight 199 lb 12.8 oz (90.629 kg), last menstrual period 02/13/2014, SpO2 100 %.  Physical Exam  Constitutional: She is oriented to person, place, and time. She appears well-developed and well-nourished.  HENT:  Head: Normocephalic.  Neck: Normal range of motion.  Cardiovascular: Normal rate, regular rhythm, normal heart sounds and intact distal pulses.   Respiratory: Effort normal and breath sounds normal.  GI: Soft. Bowel sounds are normal.  Genitourinary: Uterus normal.  Musculoskeletal: Normal range of motion.  Neurological: She is alert and oriented to person, place, and time. She has normal reflexes.  Skin: Skin is warm  and dry.  Psychiatric: She has a normal mood and affect. Her behavior is normal. Judgment and thought content normal.    MAU Course  Procedures  MDM GERD  Assessment and Plan  GI cocktail. FHR pattern reassurring, D/C home  LAWSON, MARIE [place] 08/17/2014, 1:46 PM

## 2014-08-17 NOTE — Discharge Instructions (Signed)
Heartburn During Pregnancy  °Heartburn is a burning sensation in the chest caused by stomach acid backing up into the esophagus. Heartburn is common in pregnancy because a certain hormone (progesterone) is released when a woman is pregnant. The progesterone hormone may relax the valve that separates the esophagus from the stomach. This allows acid to go up into the esophagus, causing heartburn. Heartburn may also happen in pregnancy because the enlarging uterus pushes up on the stomach, which pushes more acid into the esophagus. This is especially true in the later stages of pregnancy. Heartburn problems usually go away after giving birth. °CAUSES  °Heartburn is caused by stomach acid backing up into the esophagus. During pregnancy, this may result from various things, including:  °· The progesterone hormone. °· Changing hormone levels. °· The growing uterus pushing stomach acid upward. °· Large meals. °· Certain foods and drinks. °· Exercise. °· Increased acid production. °SIGNS AND SYMPTOMS  °· Burning pain in the chest or lower throat. °· Bitter taste in the mouth. °· Coughing. °DIAGNOSIS  °Your health care provider will typically diagnose heartburn by taking a careful history of your concern. Blood tests may be done to check for a certain type of bacteria that is associated with heartburn. Sometimes, heartburn is diagnosed by prescribing a heartburn medicine to see if the symptoms improve. In some cases, a procedure called an endoscopy may be done. In this procedure, a tube with a light and a camera on the end (endoscope) is used to examine the esophagus and the stomach. °TREATMENT  °Treatment will vary depending on the severity of your symptoms. Your health care provider may recommend: °· Over-the-counter medicines (antacids, acid reducers) for mild heartburn. °· Prescription medicines to decrease stomach acid or to protect your stomach lining. °· Certain changes in your diet. °· Elevating the head of your bed  by putting blocks under the legs. This helps prevent stomach acid from backing up into the esophagus when you are lying down. °HOME CARE INSTRUCTIONS  °· Only take over-the-counter or prescription medicines as directed by your health care provider. °· Raise the head of your bed by putting blocks under the legs if instructed to do so by your health care provider. Sleeping with more pillows is not effective because it only changes the position of your head. °· Do not exercise right after eating. °· Avoid eating 2-3 hours before bed. Do not lie down right after eating. °· Eat small meals throughout the day instead of three large meals. °· Identify foods and beverages that make your symptoms worse and avoid them. Foods you may want to avoid include: °¨ Peppers. °¨ Chocolate. °¨ High-fat foods, including fried foods. °¨ Spicy foods. °¨ Garlic and onions. °¨ Citrus fruits, including oranges, grapefruit, lemons, and limes. °¨ Food containing tomatoes or tomato products. °¨ Mint. °¨ Carbonated and caffeinated drinks. °¨ Vinegar. °SEEK MEDICAL CARE IF: °· You have abdominal pain of any kind. °· You feel burning in your upper abdomen or chest, especially after eating or lying down. °· You have nausea and vomiting. °· Your stomach feels upset after you eat. °SEEK IMMEDIATE MEDICAL CARE IF:  °· You have severe chest pain that goes down your arm or into your jaw or neck. °· You feel sweaty, dizzy, or light-headed. °· You become short of breath. °· You vomit blood. °· You have difficulty or pain with swallowing. °· You have bloody or black, tarry stools. °· You have episodes of heartburn more than 3 times a   week, for more than 2 weeks. MAKE SURE YOU:  Understand these instructions.  Will watch your condition.  Will get help right away if you are not doing well or get worse. Document Released: 04/08/2000 Document Revised: 04/16/2013 Document Reviewed: 11/28/2012 [facility] Patient Information 2015 [place], [place]. This  information is not intended to replace advice given to you by your health care provider. Make sure you discuss any questions you have with your health care provider. Second Trimester of Pregnancy The second trimester is from week 13 through week 28, months 4 through 6. The second trimester is often a time when you feel your best. Your body has also adjusted to being pregnant, and you begin to feel better physically. Usually, morning sickness has lessened or quit completely, you may have more energy, and you may have an increase in appetite. The second trimester is also a time when the fetus is growing rapidly. At the end of the sixth month, the fetus is about 9 inches long and weighs about 1 pounds. You will likely begin to feel the baby move (quickening) between 18 and 20 weeks of the pregnancy. BODY CHANGES Your body goes through many changes during pregnancy. The changes vary from woman to woman.   Your weight will continue to increase. You will notice your lower abdomen bulging out.  You may begin to get stretch marks on your hips, abdomen, and breasts.  You may develop headaches that can be relieved by medicines approved by your health care provider.  You may urinate more often because the fetus is pressing on your bladder.  You may develop or continue to have heartburn as a result of your pregnancy.  You may develop constipation because certain hormones are causing the muscles that push waste through your intestines to slow down.  You may develop hemorrhoids or swollen, bulging veins (varicose veins).  You may have back pain because of the weight gain and pregnancy hormones relaxing your joints between the bones in your pelvis and as a result of a shift in weight and the muscles that support your balance.  Your breasts will continue to grow and be tender.  Your gums may bleed and may be sensitive to brushing and flossing.  Dark spots or blotches (chloasma, mask of pregnancy) may  develop on your face. This will likely fade after the baby is born.  A dark line from your belly button to the pubic area (linea nigra) may appear. This will likely fade after the baby is born.  You may have changes in your hair. These can include thickening of your hair, rapid growth, and changes in texture. Some women also have hair loss during or after pregnancy, or hair that feels dry or thin. Your hair will most likely return to normal after your baby is born. WHAT TO EXPECT AT YOUR PRENATAL VISITS During a routine prenatal visit:  You will be weighed to make sure you and the fetus are growing normally.  Your blood pressure will be taken.  Your abdomen will be measured to track your baby's growth.  The fetal heartbeat will be listened to.  Any test results from the previous visit will be discussed. Your health care provider may ask you:  How you are feeling.  If you are feeling the baby move.  If you have had any abnormal symptoms, such as leaking fluid, bleeding, severe headaches, or abdominal cramping.  If you have any questions. Other tests that may be performed during your second trimester include:  Blood tests that check for:  Low iron levels (anemia).  Gestational diabetes (between 24 and 28 weeks).  Rh antibodies.  Urine tests to check for infections, diabetes, or protein in the urine.  An ultrasound to confirm the proper growth and development of the baby.  An amniocentesis to check for possible genetic problems.  Fetal screens for spina bifida and Down syndrome. HOME CARE INSTRUCTIONS   Avoid all smoking, herbs, alcohol, and unprescribed drugs. These chemicals affect the formation and growth of the baby.  Follow your health care provider's instructions regarding medicine use. There are medicines that are either safe or unsafe to take during pregnancy.  Exercise only as directed by your health care provider. Experiencing uterine cramps is a good sign to  stop exercising.  Continue to eat regular, healthy meals.  Wear a good support bra for breast tenderness.  Do not use hot tubs, steam rooms, or saunas.  Wear your seat belt at all times when driving.  Avoid raw meat, uncooked cheese, cat litter boxes, and soil used by cats. These carry germs that can cause birth defects in the baby.  Take your prenatal vitamins.  Try taking a stool softener (if your health care provider approves) if you develop constipation. Eat more high-fiber foods, such as fresh vegetables or fruit and whole grains. Drink plenty of fluids to keep your urine clear or pale yellow.  Take warm sitz baths to soothe any pain or discomfort caused by hemorrhoids. Use hemorrhoid cream if your health care provider approves.  If you develop varicose veins, wear support hose. Elevate your feet for 15 minutes, 3-4 times a day. Limit salt in your diet.  Avoid heavy lifting, wear low heel shoes, and practice good posture.  Rest with your legs elevated if you have leg cramps or low back pain.  Visit your dentist if you have not gone yet during your pregnancy. Use a soft toothbrush to brush your teeth and be gentle when you floss.  A sexual relationship may be continued unless your health care provider directs you otherwise.  Continue to go to all your prenatal visits as directed by your health care provider. SEEK MEDICAL CARE IF:   You have dizziness.  You have mild pelvic cramps, pelvic pressure, or nagging pain in the abdominal area.  You have persistent nausea, vomiting, or diarrhea.  You have a bad smelling vaginal discharge.  You have pain with urination. SEEK IMMEDIATE MEDICAL CARE IF:   You have a fever.  You are leaking fluid from your vagina.  You have spotting or bleeding from your vagina.  You have severe abdominal cramping or pain.  You have rapid weight gain or loss.  You have shortness of breath with chest pain.  You notice sudden or extreme  swelling of your face, hands, ankles, feet, or legs.  You have not felt your baby move in over an hour.  You have severe headaches that do not go away with medicine.  You have vision changes. Document Released: 04/05/2001 Document Revised: 04/16/2013 Document Reviewed: 06/12/2012 [facility] Patient Information 2015 [place], [place]. This information is not intended to replace advice given to you by your health care provider. Make sure you discuss any questions you have with your health care provider.  Fetal Movement Counts Patient Name: __________________________________________________ Patient Due Date: ____________________ Performing a fetal movement count is highly recommended in high-risk pregnancies, but it is good for every pregnant woman to do. Your health care provider may ask you to start  counting fetal movements at 28 weeks of the pregnancy. Fetal movements often increase:  After eating a full meal.  After physical activity.  After eating or drinking something sweet or cold.  At rest. Pay attention to when you feel the baby is most active. This will help you notice a pattern of your baby's sleep and wake cycles and what factors contribute to an increase in fetal movement. It is important to perform a fetal movement count at the same time each day when your baby is normally most active.  HOW TO COUNT FETAL MOVEMENTS  Find a quiet and comfortable area to sit or lie down on your left side. Lying on your left side provides the best blood and oxygen circulation to your baby.  Write down the day and time on a sheet of paper or in a journal.  Start counting kicks, flutters, swishes, rolls, or jabs in a 2-hour period. You should feel at least 10 movements within 2 hours.  If you do not feel 10 movements in 2 hours, wait 2-3 hours and count again. Look for a change in the pattern or not enough counts in 2 hours. SEEK MEDICAL CARE IF:  You feel less than 10 counts in 2 hours, tried  twice.  There is no movement in over an hour.  The pattern is changing or taking longer each day to reach 10 counts in 2 hours.  You feel the baby is not moving as he or she usually does. Date: ____________ Movements: ____________ Start time: ____________ Elizebeth Koller time: ____________  Date: ____________ Movements: ____________ Start time: ____________ Elizebeth Koller time: ____________ Date: ____________ Movements: ____________ Start time: ____________ Elizebeth Koller time: ____________ Date: ____________ Movements: ____________ Start time: ____________ Elizebeth Koller time: ____________ Date: ____________ Movements: ____________ Start time: ____________ Elizebeth Koller time: ____________ Date: ____________ Movements: ____________ Start time: ____________ Elizebeth Koller time: ____________ Date: ____________ Movements: ____________ Start time: ____________ Elizebeth Koller time: ____________ Date: ____________ Movements: ____________ Start time: ____________ Elizebeth Koller time: ____________  Date: ____________ Movements: ____________ Start time: ____________ Elizebeth Koller time: ____________ Date: ____________ Movements: ____________ Start time: ____________ Elizebeth Koller time: ____________ Date: ____________ Movements: ____________ Start time: ____________ Elizebeth Koller time: ____________ Date: ____________ Movements: ____________ Start time: ____________ Elizebeth Koller time: ____________ Date: ____________ Movements: ____________ Start time: ____________ Elizebeth Koller time: ____________ Date: ____________ Movements: ____________ Start time: ____________ Elizebeth Koller time: ____________ Date: ____________ Movements: ____________ Start time: ____________ Elizebeth Koller time: ____________  Date: ____________ Movements: ____________ Start time: ____________ Elizebeth Koller time: ____________ Date: ____________ Movements: ____________ Start time: ____________ Elizebeth Koller time: ____________ Date: ____________ Movements: ____________ Start time: ____________ Elizebeth Koller time: ____________ Date: ____________ Movements:  ____________ Start time: ____________ Elizebeth Koller time: ____________ Date: ____________ Movements: ____________ Start time: ____________ Elizebeth Koller time: ____________ Date: ____________ Movements: ____________ Start time: ____________ Elizebeth Koller time: ____________ Date: ____________ Movements: ____________ Start time: ____________ Elizebeth Koller time: ____________  Date: ____________ Movements: ____________ Start time: ____________ Elizebeth Koller time: ____________ Date: ____________ Movements: ____________ Start time: ____________ Elizebeth Koller time: ____________ Date: ____________ Movements: ____________ Start time: ____________ Elizebeth Koller time: ____________ Date: ____________ Movements: ____________ Start time: ____________ Elizebeth Koller time: ____________ Date: ____________ Movements: ____________ Start time: ____________ Elizebeth Koller time: ____________ Date: ____________ Movements: ____________ Start time: ____________ Elizebeth Koller time: ____________ Date: ____________ Movements: ____________ Start time: ____________ Elizebeth Koller time: ____________  Date: ____________ Movements: ____________ Start time: ____________ Elizebeth Koller time: ____________ Date: ____________ Movements: ____________ Start time: ____________ Elizebeth Koller time: ____________ Date: ____________ Movements: ____________ Start time: ____________ Elizebeth Koller time: ____________ Date: ____________ Movements: ____________ Start time: ____________ Elizebeth Koller time: ____________ Date: ____________ Movements: ____________ Start time: ____________ Elizebeth Koller  time: ____________ Date: ____________ Movements: ____________ Start time: ____________ Elizebeth Koller time: ____________ Date: ____________ Movements: ____________ Start time: ____________ Elizebeth Koller time: ____________  Date: ____________ Movements: ____________ Start time: ____________ Elizebeth Koller time: ____________ Date: ____________ Movements: ____________ Start time: ____________ Elizebeth Koller time: ____________ Date: ____________ Movements: ____________ Start time: ____________ Elizebeth Koller  time: ____________ Date: ____________ Movements: ____________ Start time: ____________ Elizebeth Koller time: ____________ Date: ____________ Movements: ____________ Start time: ____________ Elizebeth Koller time: ____________ Date: ____________ Movements: ____________ Start time: ____________ Elizebeth Koller time: ____________ Date: ____________ Movements: ____________ Start time: ____________ Elizebeth Koller time: ____________  Date: ____________ Movements: ____________ Start time: ____________ Elizebeth Koller time: ____________ Date: ____________ Movements: ____________ Start time: ____________ Elizebeth Koller time: ____________ Date: ____________ Movements: ____________ Start time: ____________ Elizebeth Koller time: ____________ Date: ____________ Movements: ____________ Start time: ____________ Elizebeth Koller time: ____________ Date: ____________ Movements: ____________ Start time: ____________ Elizebeth Koller time: ____________ Date: ____________ Movements: ____________ Start time: ____________ Elizebeth Koller time: ____________ Date: ____________ Movements: ____________ Start time: ____________ Elizebeth Koller time: ____________  Date: ____________ Movements: ____________ Start time: ____________ Elizebeth Koller time: ____________ Date: ____________ Movements: ____________ Start time: ____________ Elizebeth Koller time: ____________ Date: ____________ Movements: ____________ Start time: ____________ Elizebeth Koller time: ____________ Date: ____________ Movements: ____________ Start time: ____________ Elizebeth Koller time: ____________ Date: ____________ Movements: ____________ Start time: ____________ Elizebeth Koller time: ____________ Date: ____________ Movements: ____________ Start time: ____________ Elizebeth Koller time: ____________ Document Released: 05/11/2006 Document Revised: 08/26/2013 Document Reviewed: 02/06/2012 [employer] Patient Information 2015 [place] [place]. This information is not intended to replace advice given to you by your health care provider. Make sure you discuss any questions you have with your health care  provider. Braxton Hicks Contractions Contractions of the uterus can occur throughout pregnancy. Contractions are not always a sign that you are in labor.  WHAT ARE BRAXTON HICKS CONTRACTIONS?  Contractions that occur before labor are called Braxton Hicks contractions, or false labor. Toward the end of pregnancy (32-34 weeks), these contractions can develop more often and may become more forceful. This is not true labor because these contractions do not result in opening (dilatation) and thinning of the cervix. They are sometimes difficult to tell apart from true labor because these contractions can be forceful and people have different pain tolerances. You should not feel embarrassed if you go to the hospital with false labor. Sometimes, the only way to tell if you are in true labor is for your health care provider to look for changes in the cervix. If there are no prenatal problems or other health problems associated with the pregnancy, it is completely safe to be sent home with false labor and await the onset of true labor. HOW CAN YOU TELL THE DIFFERENCE BETWEEN TRUE AND FALSE LABOR? False Labor  The contractions of false labor are usually shorter and not as hard as those of true labor.   The contractions are usually irregular.   The contractions are often felt in the front of the lower abdomen and in the groin.   The contractions may go away when you walk around or change positions while lying down.   The contractions get weaker and are shorter lasting as time goes on.   The contractions do not usually become progressively stronger, regular, and closer together as with true labor.  True Labor  Contractions in true labor last 30-70 seconds, become very regular, usually become more intense, and increase in frequency.   The contractions do not go away with walking.   The discomfort is usually felt in the top of the uterus and spreads to the lower abdomen and  low back.   True  labor can be determined by your health care provider with an exam. This will show that the cervix is dilating and getting thinner.  WHAT TO REMEMBER  Keep up with your usual exercises and follow other instructions given by your health care provider.   Take medicines as directed by your health care provider.   Keep your regular prenatal appointments.   Eat and drink lightly if you think you are going into labor.   If Braxton Hicks contractions are making you uncomfortable:   Change your position from lying down or resting to walking, or from walking to resting.   Sit and rest in a tub of warm water.   Drink 2-3 glasses of water. Dehydration may cause these contractions.   Do slow and deep breathing several times an hour.  WHEN SHOULD I SEEK IMMEDIATE MEDICAL CARE? Seek immediate medical care if:  Your contractions become stronger, more regular, and closer together.   You have fluid leaking or gushing from your vagina.   You have a fever.   You pass blood-tinged mucus.   You have vaginal bleeding.   You have continuous abdominal pain.   You have low back pain that you never had before.   You feel your baby's head pushing down and causing pelvic pressure.   Your baby is not moving as much as it used to.  Document Released: 04/11/2005 Document Revised: 04/16/2013 Document Reviewed: 01/21/2013 [facility] Patient Information 2015 [place], [place]. This information is not intended to replace advice given to you by your health care provider. Make sure you discuss any questions you have with your health care provider.

## 2014-08-17 NOTE — [facility] Note (Signed)
Pt presents to [facility] with complaints of chest pain that started yesterday, states she has taken tums with no relief, and she noticed a decrease in fetal movement since yesterday morning.

## 2014-09-09 ENCOUNTER — Other Ambulatory Visit: Payer: 59

## 2014-09-09 ENCOUNTER — Ambulatory Visit (INDEPENDENT_AMBULATORY_CARE_PROVIDER_SITE_OTHER): Payer: 59 | Admitting: Women's Health

## 2014-09-09 ENCOUNTER — Encounter: Payer: Self-pay | Admitting: Women's Health

## 2014-09-09 VITALS — BP 116/70 | HR 60 | Wt 209.0 lb

## 2014-09-09 DIAGNOSIS — Z369 Encounter for antenatal screening, unspecified: Secondary | ICD-10-CM

## 2014-09-09 DIAGNOSIS — Z1389 Encounter for screening for other disorder: Secondary | ICD-10-CM

## 2014-09-09 DIAGNOSIS — Z3492 Encounter for supervision of normal pregnancy, unspecified, second trimester: Secondary | ICD-10-CM

## 2014-09-09 DIAGNOSIS — Z131 Encounter for screening for diabetes mellitus: Secondary | ICD-10-CM

## 2014-09-09 DIAGNOSIS — Z331 Pregnant state, incidental: Secondary | ICD-10-CM

## 2014-09-09 LAB — POCT URINALYSIS DIPSTICK
Blood, UA: NEGATIVE
Glucose, UA: NEGATIVE
KETONES UA: NEGATIVE
Leukocytes, UA: NEGATIVE
Nitrite, UA: NEGATIVE
Protein, UA: NEGATIVE

## 2014-09-09 NOTE — Progress Notes (Signed)
Low-risk OB appointment [id number] [redacted]w[redacted]d Estimated Date of Delivery: 12/06/14 BP 116/70 mmHg  Pulse 60  Wt 209 lb (94.802 kg)  LMP 02/13/2014 (Exact Date)  BP, weight, and urine reviewed.  Refer to obstetrical flow sheet for FH & FHR.  Reports good fm.  Denies regular uc's, lof, vb, or uti s/s. Feels like heart races ~once/wk- gets to 130s, has mild cp when it happens. Went to ED- was told she had heartburn, states antacids don't help.  Drinks sweet tea daily. To cut out all caffeine to see if will help. When happens do valsalva maneuver. If severe cp, sob, etc seek care. If these measures don't help may consider Holter monitor.  Reviewed ptl s/s, fkc. Recommended Tdap at HD/PCP per CDC guidelines.  Plan:  Continue routine obstetrical care  F/U in 3wks for OB appointment

## 2014-09-09 NOTE — Patient Instructions (Signed)
Stop all caffeine When feels like your heart is racing, take deep breath hold it and bear down like having a bowel movement- to see if heart rate will come down If you develop severe chest pain, shortness of breath, seek care  Call the office [phone]) or go to [facility] if:  You begin to have strong, frequent contractions  Your water breaks.  Sometimes it is a big gush of fluid, sometimes it is just a trickle that keeps getting your panties wet or running down your legs  You have vaginal bleeding.  It is normal to have a small amount of spotting if your cervix was checked.   You don't feel your baby moving like normal.  If you don't, get you something to eat and drink and lay down and focus on feeling your baby move.  You should feel at least 10 movements in 2 hours.  If you don't, you should call the office or go to [facility].    Tdap Vaccine  It is recommended that you get the Tdap vaccine during the third trimester of EACH pregnancy to help protect your baby from getting pertussis (whooping cough)  27-36 weeks is the BEST time to do this so that you can pass the protection on to your baby. During pregnancy is better than after pregnancy, but if you are unable to get it during pregnancy it will be offered at the hospital.   You can get this vaccine at the health department or your family doctor  Everyone who will be around your baby should also be up-to-date on their vaccines. Adults (who are not pregnant) only need 1 dose of Tdap during adulthood.   Third Trimester of Pregnancy The third trimester is from week 29 through week 42, months 7 through 9. The third trimester is a time when the fetus is growing rapidly. At the end of the ninth month, the fetus is about 20 inches in length and weighs 6-10 pounds.  BODY CHANGES Your body goes through many changes during pregnancy. The changes vary from woman to woman.   Your weight will continue to increase. You can expect to  gain 25-35 pounds (11-16 kg) by the end of the pregnancy.  You may begin to get stretch marks on your hips, abdomen, and breasts.  You may urinate more often because the fetus is moving lower into your pelvis and pressing on your bladder.  You may develop or continue to have heartburn as a result of your pregnancy.  You may develop constipation because certain hormones are causing the muscles that push waste through your intestines to slow down.  You may develop hemorrhoids or swollen, bulging veins (varicose veins).  You may have pelvic pain because of the weight gain and pregnancy hormones relaxing your joints between the bones in your pelvis. Backaches may result from overexertion of the muscles supporting your posture.  You may have changes in your hair. These can include thickening of your hair, rapid growth, and changes in texture. Some women also have hair loss during or after pregnancy, or hair that feels dry or thin. Your hair will most likely return to normal after your baby is born.  Your breasts will continue to grow and be tender. A yellow discharge may leak from your breasts called colostrum.  Your belly button may stick out.  You may feel short of breath because of your expanding uterus.  You may notice the fetus "dropping," or moving lower in your abdomen.  You may have a bloody mucus discharge. This usually occurs a few days to a week before labor begins.  Your cervix becomes thin and soft (effaced) near your due date. WHAT TO EXPECT AT YOUR PRENATAL EXAMS  You will have prenatal exams every 2 weeks until week 36. Then, you will have weekly prenatal exams. During a routine prenatal visit:  You will be weighed to make sure you and the fetus are growing normally.  Your blood pressure is taken.  Your abdomen will be measured to track your baby's growth.  The fetal heartbeat will be listened to.  Any test results from the previous visit will be discussed.  You may  have a cervical check near your due date to see if you have effaced. At around 36 weeks, your caregiver will check your cervix. At the same time, your caregiver will also perform a test on the secretions of the vaginal tissue. This test is to determine if a type of bacteria, Group B streptococcus, is present. Your caregiver will explain this further. Your caregiver may ask you:  What your birth plan is.  How you are feeling.  If you are feeling the baby move.  If you have had any abnormal symptoms, such as leaking fluid, bleeding, severe headaches, or abdominal cramping.  If you have any questions. Other tests or screenings that may be performed during your third trimester include:  Blood tests that check for low iron levels (anemia).  Fetal testing to check the health, activity level, and growth of the fetus. Testing is done if you have certain medical conditions or if there are problems during the pregnancy. FALSE LABOR You may feel small, irregular contractions that eventually go away. These are called Braxton Hicks contractions, or false labor. Contractions may last for hours, days, or even weeks before true labor sets in. If contractions come at regular intervals, intensify, or become painful, it is best to be seen by your caregiver.  SIGNS OF LABOR   Menstrual-like cramps.  Contractions that are 5 minutes apart or less.  Contractions that start on the top of the uterus and spread down to the lower abdomen and back.  A sense of increased pelvic pressure or back pain.  A watery or bloody mucus discharge that comes from the vagina. If you have any of these signs before the 37th week of pregnancy, call your caregiver right away. You need to go to the hospital to get checked immediately. HOME CARE INSTRUCTIONS   Avoid all smoking, herbs, alcohol, and unprescribed drugs. These chemicals affect the formation and growth of the baby.  Follow your caregiver's instructions regarding  medicine use. There are medicines that are either safe or unsafe to take during pregnancy.  Exercise only as directed by your caregiver. Experiencing uterine cramps is a good sign to stop exercising.  Continue to eat regular, healthy meals.  Wear a good support bra for breast tenderness.  Do not use hot tubs, steam rooms, or saunas.  Wear your seat belt at all times when driving.  Avoid raw meat, uncooked cheese, cat litter boxes, and soil used by cats. These carry germs that can cause birth defects in the baby.  Take your prenatal vitamins.  Try taking a stool softener (if your caregiver approves) if you develop constipation. Eat more high-fiber foods, such as fresh vegetables or fruit and whole grains. Drink plenty of fluids to keep your urine clear or pale yellow.  Take warm sitz baths to soothe any pain  or discomfort caused by hemorrhoids. Use hemorrhoid cream if your caregiver approves.  If you develop varicose veins, wear support hose. Elevate your feet for 15 minutes, 3-4 times a day. Limit salt in your diet.  Avoid heavy lifting, wear low heal shoes, and practice good posture.  Rest a lot with your legs elevated if you have leg cramps or low back pain.  Visit your dentist if you have not gone during your pregnancy. Use a soft toothbrush to brush your teeth and be gentle when you floss.  A sexual relationship may be continued unless your caregiver directs you otherwise.  Do not travel far distances unless it is absolutely necessary and only with the approval of your caregiver.  Take prenatal classes to understand, practice, and ask questions about the labor and delivery.  Make a trial run to the hospital.  Pack your hospital bag.  Prepare the baby's nursery.  Continue to go to all your prenatal visits as directed by your caregiver. SEEK MEDICAL CARE IF:  You are unsure if you are in labor or if your water has broken.  You have dizziness.  You have mild pelvic  cramps, pelvic pressure, or nagging pain in your abdominal area.  You have persistent nausea, vomiting, or diarrhea.  You have a bad smelling vaginal discharge.  You have pain with urination. SEEK IMMEDIATE MEDICAL CARE IF:   You have a fever.  You are leaking fluid from your vagina.  You have spotting or bleeding from your vagina.  You have severe abdominal cramping or pain.  You have rapid weight loss or gain.  You have shortness of breath with chest pain.  You notice sudden or extreme swelling of your face, hands, ankles, feet, or legs.  You have not felt your baby move in over an hour.  You have severe headaches that do not go away with medicine.  You have vision changes. Document Released: 04/05/2001 Document Revised: 04/16/2013 Document Reviewed: 06/12/2012 [facility] Patient Information 2015 [place], [place]. This information is not intended to replace advice given to you by your health care provider. Make sure you discuss any questions you have with your health care provider.

## 2014-09-10 ENCOUNTER — Encounter (HOSPITAL_COMMUNITY): Payer: Self-pay

## 2014-09-10 ENCOUNTER — Inpatient Hospital Stay (HOSPITAL_COMMUNITY)
Admission: AD | Admit: 2014-09-10 | Discharge: 2014-09-11 | Disposition: A | Discharge status: Home / Self Care | Payer: 59 | Source: Ambulatory Visit | Attending: Obstetrics & Gynecology | Admitting: Obstetrics & Gynecology

## 2014-09-10 DIAGNOSIS — O09293 Supervision of pregnancy with other poor reproductive or obstetric history, third trimester: Secondary | ICD-10-CM

## 2014-09-10 DIAGNOSIS — N949 Unspecified condition associated with female genital organs and menstrual cycle: Secondary | ICD-10-CM

## 2014-09-10 DIAGNOSIS — Z3A27 27 weeks gestation of pregnancy: Secondary | ICD-10-CM | POA: Diagnosis not present

## 2014-09-10 DIAGNOSIS — O9989 Other specified diseases and conditions complicating pregnancy, childbirth and the puerperium: Secondary | ICD-10-CM | POA: Diagnosis not present

## 2014-09-10 DIAGNOSIS — O26893 Other specified pregnancy related conditions, third trimester: Secondary | ICD-10-CM

## 2014-09-10 DIAGNOSIS — R51 Headache: Secondary | ICD-10-CM | POA: Insufficient documentation

## 2014-09-10 DIAGNOSIS — Z87891 Personal history of nicotine dependence: Secondary | ICD-10-CM | POA: Diagnosis not present

## 2014-09-10 DIAGNOSIS — R109 Unspecified abdominal pain: Secondary | ICD-10-CM | POA: Diagnosis present

## 2014-09-10 DIAGNOSIS — O26899 Other specified pregnancy related conditions, unspecified trimester: Secondary | ICD-10-CM

## 2014-09-10 LAB — URINALYSIS, ROUTINE W REFLEX MICROSCOPIC
Bilirubin Urine: NEGATIVE
GLUCOSE, UA: NEGATIVE mg/dL
Hgb urine dipstick: NEGATIVE
Ketones, ur: NEGATIVE mg/dL
LEUKOCYTES UA: NEGATIVE
NITRITE: NEGATIVE
PH: 6 (ref 5.0–8.0)
Protein, ur: NEGATIVE mg/dL
UROBILINOGEN UA: 0.2 mg/dL (ref 0.0–1.0)

## 2014-09-10 LAB — GLUCOSE TOLERANCE, 2 HOURS W/ 1HR
Glucose, 1 hour: 100 mg/dL (ref 65–179)
Glucose, 2 hour: 123 mg/dL (ref 65–152)
Glucose, Fasting: 73 mg/dL (ref 65–91)

## 2014-09-10 LAB — HIV ANTIBODY (ROUTINE TESTING W REFLEX): HIV Screen 4th Generation wRfx: NONREACTIVE

## 2014-09-10 LAB — CBC
HEMOGLOBIN: 12.2 g/dL (ref 11.1–15.9)
Hematocrit: 35.3 % (ref 34.0–46.6)
MCH: 30 pg (ref 26.6–33.0)
MCHC: 34.6 g/dL (ref 31.5–35.7)
MCV: 87 fL (ref 79–97)
Platelets: 371 10*3/uL (ref 150–379)
RBC: 4.06 x10E6/uL (ref 3.77–5.28)
RDW: 14.4 % (ref 12.3–15.4)
WBC: 11.8 10*3/uL — ABNORMAL HIGH (ref 3.4–10.8)

## 2014-09-10 LAB — ANTIBODY SCREEN: ANTIBODY SCREEN: NEGATIVE

## 2014-09-10 LAB — RPR: RPR Ser Ql: NONREACTIVE

## 2014-09-10 LAB — HSV 2 ANTIBODY, IGG

## 2014-09-10 NOTE — MAU Note (Signed)
Constant abdominal pain since 730 pm tonight. Denies vaginal bleeding or LOF. Positive fetal movement.  Headaches x 3 days; mildly improved with tylenol. Spots in vision with headaches.

## 2014-09-11 DIAGNOSIS — Z3A27 27 weeks gestation of pregnancy: Secondary | ICD-10-CM

## 2014-09-11 DIAGNOSIS — R109 Unspecified abdominal pain: Secondary | ICD-10-CM

## 2014-09-11 DIAGNOSIS — O9989 Other specified diseases and conditions complicating pregnancy, childbirth and the puerperium: Secondary | ICD-10-CM | POA: Diagnosis not present

## 2014-09-11 NOTE — Discharge Instructions (Signed)
-   Since we have been monitoring you, both you and your baby have done well - Heart monitoring for your baby has been normal and consistent with a healthy baby - Your blood pressures have all been reassuring - Your symptoms do not seem consistent with Pre-elcampsia at this point. Your urinalysis test today showed no protein, which is a good sign and unlikely to be Pre-eclampsia - You may take Tylenol 1000mg  (2 of the 500mg  tabs) per dose up to 3 times a day or every 6-8 hours as needed for headaches. Important to stay hydrated and eat small regular meals to avoid other symptoms. If your headache does not respond or gets significantly worse, please call [facility] about your symptoms, or if severe you may return for re-evaluation. - Follow-up with next Prenatal appointment at [facility] 09/30/14 at 9:00am with Knute Neu. Call sooner if needed

## 2014-09-11 NOTE — MAU Provider Note (Signed)
MAU Provider Note  Chief Complaint:  Abdominal Pain; Headache; and Contractions   Dana Mcpherson is  24 y.o. G3P1011 at [redacted]w[redacted]d presents complaining of Abdominal Pain; Headache; and Contractions  Today she reports that she had onset of abdominal pain starting at [place], located across mid abdomen, ranging from dull to sharp, nothing improves and nothing worsens, tried drinking large amount of water, tried taking shower. Last meal around 6pm without improvement. Additionally admits to pelvic pressure that started tonight occurring every 20-44mins. Also reports headache, frontal bilateral, started on Monday, with intermittent improvement, worse at night with poor sleeping, taking Tylenol x 1 with mild relief. - Currently now with resolved abdominal pain and improved headache, but still with occasional sharp pain following pelvic pressure, which seems to be irregular. - Admits nausea, seeing flash of light spots (intermittent, associated with headache) - Denies any vomiting, diarrhea, fevers/chills, CP, SOB, vision, swelling  - Regular fetal movement - Denies any vaginal bleeding, leakage of fluid  Prenatal Care / Complications Followed by [facility], last seen on 5/17, at that visit she was not having regular contractions, but did admit to feeling heart racing once a week with some mild chest discomfort, previously told in ED that she had GERD. She had been advised to stop all caffeine and may try valsalva if heart races.  Prior history of Pre-eclampsia, late in 1st pregnancy dx at time of labor with elevated BP >200. Currently during pregnancy without elevated BPs, no history of proteinuria.   Obstetrical/Gynecological History: OB History    Gravida Para Term Preterm AB TAB SAB Ectopic Multiple Living   3 1 1  1  1   1       Obstetric Comments   IOL for pre-e     Past Medical History: Past Medical History  Diagnosis Date  . Allergy   . Migraine headache   . Polycystic ovarian  syndrome   . Pregnancy induced hypertension   . Vaginal Pap smear, abnormal   . Complication of anesthesia     woke up during surgery & ithing after surgery    Past Surgical History: Past Surgical History  Procedure Laterality Date  . Cholecystectomy      Family History: Family History  Problem Relation Age of Onset  . Cancer Paternal Grandmother     ovarian cancer    Social History: History  Substance Use Topics  . Smoking status: Former Research scientist (life sciences)  . Smokeless tobacco: Never Used  . Alcohol Use: No     Comment: occasional    Allergies:  Allergies  Allergen Reactions  . Other Anaphylaxis    Axe and Tag body spray, lysol  . Shellfish Allergy Anaphylaxis and Hives  . Eggs Or Egg-Derived Products Itching and Nausea Only    Meds:  Prescriptions prior to admission  Medication Sig Dispense Refill Last Dose  . acetaminophen (TYLENOL) 500 MG tablet Take 500 mg by mouth every 6 (six) hours as needed for headache.   09/09/2014 at Unknown time  . aspirin 81 MG tablet Take 81 mg by mouth daily.   09/10/2014 at 2000  . Doxylamine-Pyridoxine (DICLEGIS) 10-10 MG TBEC Take 2 tablets by mouth at bedtime. May take an additional tablet in the morning and an additional tablet in the afternoon prn nausea 120 tablet 1 09/10/2014 at 2000  . omeprazole (PRILOSEC) 20 MG capsule Take 1 capsule (20 mg total) by mouth 2 (two) times daily before a meal. 60 capsule 5 09/10/2014 at 2000  .  Prenat-FeFum-FePo-FA-Omega 3 (CONCEPT DHA) 53.5-38-1 MG CAPS Take 1 capsule by mouth daily. 30 capsule 11 09/10/2014 at 2000    Review of Systems -   See above HPI   Physical Exam  Blood pressure 118/74, pulse 105, resp. rate 18, height 5' 6.5" (1.689 m), weight 95.89 kg (211 lb 6.4 oz), last menstrual period 02/13/2014. GENERAL: Well-developed, well-nourished female, comfortable and cooperative, NAD LUNGS: CTAB HEART: Tachycardic, regular rhythm, no murmurs ABDOMEN: Appropriately gravid for GA, soft, non-tender  and nondistended EXTREMITIES: Non-tender, no edema, 2+ distal pulses.  FHT: Baseline rate 150 bpm Variability moderate Accelerations present 15x15. Decelerations none Contractions: None   Labs: Results for orders placed or performed during the hospital encounter of 09/10/14 (from the past 24 hour(s))  Urinalysis, Routine w reflex microscopic   Collection Time: 09/10/14 11:06 PM  Result Value Ref Range   Color, Urine YELLOW YELLOW   APPearance CLEAR CLEAR   Specific Gravity, Urine <1.005 (L) 1.005 - 1.030   pH 6.0 5.0 - 8.0   Glucose, UA NEGATIVE NEGATIVE mg/dL   Hgb urine dipstick NEGATIVE NEGATIVE   Bilirubin Urine NEGATIVE NEGATIVE   Ketones, ur NEGATIVE NEGATIVE mg/dL   Protein, ur NEGATIVE NEGATIVE mg/dL   Urobilinogen, UA 0.2 0.0 - 1.0 mg/dL   Nitrite NEGATIVE NEGATIVE   Leukocytes, UA NEGATIVE NEGATIVE   Imaging Studies:  No results found.  Assessment: Dana Mcpherson is  24 y.o. G3P1011 at [redacted]w[redacted]d presents to MAU with abdominal pain, pelvic pressure, and persistent headache. Significant PMH with prior pre-eclampsia during 1st pregnancy (dx at time of labor with SBP 200). Current pregnancy without complications or elevated BP. Today BP in MAU stable 118-132 / 74-83 x 6 checks over >1 hour. FHT reassuring. Currently symptoms improved with resolved abdominal pain and improved HA, now with irregular pelvic pressure but no regular contractions. UA with negative proteinuria. Additionally reviewed labs from 5/17 with negative 2hr GTT. Unlikely pre-eclampsia without elevated BP and no proteinuria on UA today  Plan: 1. Discharge to home. Reassurance, unlikely pre-eclampsia. BP stable. 2. No further labs today in MAU 3. Monitor BP at routine prenatal visits 4. Recommend taking appropriate dose Tylenol PRN for HA and improve hydration 5. Return criteria given if worsening or new symptoms with HA not responding to Tylenol, abd/RUQ/epigastric pain, acute swelling 6. Follow-up already  scheduled at [facility] on 09/30/14 at 9:00am for next pre-natal, call if needs sooner f/u  Nobie Putnam, [place], PGY-2 5/19/201612:14 AM  CNM attestation:  I have seen and examined this patient; I agree with above documentation in the resident's note.   Dana Mcpherson is a 24 y.o. G3P1011 reporting mostly previous abd pain that has mostly resolved w/ occ sharp irreg discomfort now; mild H/A +FM, denies LOF, VB, contractions, vaginal discharge.  PE: BP 126/78 mmHg  Pulse 107  Resp 18  Ht 5' 6.5" (1.689 m)  Wt 95.89 kg (211 lb 6.4 oz)  BMI 33.61 kg/m2  LMP 02/13/2014 (Exact Date) Gen: calm comfortable, NAD Resp: normal effort, no distress Abd: gravid  ROS, labs, PMH reviewed NST reactive +accels, no decels  Plan: - fetal kick counts reinforced, pre labor precautions rev'd - rev'd nl glucola - continue routine follow up in OB clinic  Aslin Farinas, CNM 1:12 AM

## 2014-09-15 ENCOUNTER — Telehealth: Payer: Self-pay | Admitting: Advanced Practice Midwife

## 2014-09-15 NOTE — Telephone Encounter (Signed)
Pt states she has a lot of lower back pain and wants to know if it is safe to use [facility] on lower back, informed pt that is is fine to use it per [place].

## 2014-09-30 ENCOUNTER — Ambulatory Visit (HOSPITAL_COMMUNITY)
Admission: RE | Admit: 2014-09-30 | Discharge: 2014-09-30 | Disposition: A | Discharge status: Home / Self Care | Payer: 59 | Source: Ambulatory Visit | Attending: Women's Health | Admitting: Women's Health

## 2014-09-30 ENCOUNTER — Encounter: Payer: Self-pay | Admitting: Women's Health

## 2014-09-30 ENCOUNTER — Ambulatory Visit (INDEPENDENT_AMBULATORY_CARE_PROVIDER_SITE_OTHER): Payer: 59 | Admitting: Women's Health

## 2014-09-30 VITALS — BP 128/66 | HR 104 | Wt 216.0 lb

## 2014-09-30 DIAGNOSIS — Z3A30 30 weeks gestation of pregnancy: Secondary | ICD-10-CM | POA: Diagnosis not present

## 2014-09-30 DIAGNOSIS — Z3493 Encounter for supervision of normal pregnancy, unspecified, third trimester: Secondary | ICD-10-CM

## 2014-09-30 DIAGNOSIS — R0602 Shortness of breath: Secondary | ICD-10-CM | POA: Insufficient documentation

## 2014-09-30 DIAGNOSIS — Z331 Pregnant state, incidental: Secondary | ICD-10-CM

## 2014-09-30 DIAGNOSIS — R000 Tachycardia, unspecified: Secondary | ICD-10-CM

## 2014-09-30 DIAGNOSIS — Z1389 Encounter for screening for other disorder: Secondary | ICD-10-CM

## 2014-09-30 LAB — POCT URINALYSIS DIPSTICK
Blood, UA: NEGATIVE
Glucose, UA: NEGATIVE
Ketones, UA: NEGATIVE
Leukocytes, UA: NEGATIVE
Nitrite, UA: NEGATIVE
PROTEIN UA: NEGATIVE

## 2014-09-30 NOTE — Progress Notes (Signed)
Low-risk OB appointment G3P1011 [redacted]w[redacted]d Estimated Date of Delivery: 12/06/14 BP 128/66 mmHg  Pulse 104  Wt 216 lb (97.977 kg)  LMP 02/13/2014 (Exact Date)  BP, weight, and urine reviewed.  Refer to obstetrical flow sheet for FH & FHR.  Reports good fm.  Denies regular uc's, vb, or uti s/s. Has had leakage of fluid for about 2 weeks, never big gush or trickling down leg, but puddles in underwear throughout day, doesn't smell like urine to her.  Still having episodes of heart racing/sob/cp about once/wk, lasts anywhere from hours to entire day, HR gets as high as 145, no change w/ Valsava. Has cut out all caffeine.  Order for 48hr Holter placed, to go today to have put on at 12:45. Appt w/ cardiology 6/17 @ 1:20 to discuss symptoms and Holter monitor results.  SSE: cx visually closed, no pooling of fluid, no change w/ valsalva. Fern and nitrazine neg.  Reviewed ptl s/s, fkc. Plan:  Continue routine obstetrical care  F/U in 2wks for OB appointment

## 2014-09-30 NOTE — Patient Instructions (Signed)
Today at [facility], main entrance at 12:45 to have monitor placed Friday 6/17 @ 1:20pm appointment at [facility] in [place] (attached to hospital)  Call the office [phone]) or go to [facility] if:  You begin to have strong, frequent contractions  Your water breaks.  Sometimes it is a big gush of fluid, sometimes it is just a trickle that keeps getting your panties wet or running down your legs  You have vaginal bleeding.  It is normal to have a small amount of spotting if your cervix was checked.   You don't feel your baby moving like normal.  If you don't, get you something to eat and drink and lay down and focus on feeling your baby move.  You should feel at least 10 movements in 2 hours.  If you don't, you should call the office or go to [place] Preterm labor is when labor starts at less than 37 weeks of pregnancy. The normal length of a pregnancy is 39 to 41 weeks. CAUSES Often, there is no identifiable underlying cause as to why a woman goes into preterm labor. One of the most common known causes of preterm labor is infection. Infections of the uterus, cervix, vagina, amniotic sac, bladder, kidney, or even the lungs (pneumonia) can cause labor to start. Other suspected causes of preterm labor include:   Urogenital infections, such as yeast infections and bacterial vaginosis.   Uterine abnormalities (uterine shape, uterine septum, fibroids, or bleeding from the placenta).   A cervix that has been operated on (it may fail to stay closed).   Malformations in the fetus.   Multiple gestations (twins, triplets, and so on).   Breakage of the amniotic sac.  RISK FACTORS  Having a previous history of preterm labor.   Having premature rupture of membranes (PROM).   Having a placenta that covers the opening of the cervix (placenta previa).   Having a placenta that separates from the uterus (placental abruption).    Having a cervix that is too weak to hold the fetus in the uterus (incompetent cervix).   Having too much fluid in the amniotic sac (polyhydramnios).   Taking illegal drugs or smoking while pregnant.   Not gaining enough weight while pregnant.   Being younger than 97 and older than 26 years old.   Having a low socioeconomic status.   Being African American. SYMPTOMS Signs and symptoms of preterm labor include:   Menstrual-like cramps, abdominal pain, or back pain.  Uterine contractions that are regular, as frequent as six in an hour, regardless of their intensity (may be mild or painful).  Contractions that start on the top of the uterus and spread down to the lower abdomen and back.   A sense of increased pelvic pressure.   A watery or bloody mucus discharge that comes from the vagina.  TREATMENT Depending on the length of the pregnancy and other circumstances, your health care provider may suggest bed rest. If necessary, there are medicines that can be given to stop contractions and to mature the fetal lungs. If labor happens before 34 weeks of pregnancy, a prolonged hospital stay may be recommended. Treatment depends on the condition of both you and the fetus.  WHAT SHOULD YOU DO IF YOU THINK YOU ARE IN PRETERM LABOR? Call your health care provider right away. You will need to go to the hospital to get checked immediately. HOW CAN YOU PREVENT PRETERM LABOR IN FUTURE PREGNANCIES? You  should:   Stop smoking if you smoke.  Maintain healthy weight gain and avoid chemicals and drugs that are not necessary.  Be watchful for any type of infection.  Inform your health care provider if you have a known history of preterm labor. Document Released: 07/02/2003 Document Revised: 12/12/2012 Document Reviewed: 05/14/2012 [facility] Patient Information 2015 [place], [place]. This information is not intended to replace advice given to you by your health care provider. Make sure  you discuss any questions you have with your health care provider.

## 2014-10-07 DIAGNOSIS — R000 Tachycardia, unspecified: Secondary | ICD-10-CM | POA: Insufficient documentation

## 2014-10-10 ENCOUNTER — Ambulatory Visit (INDEPENDENT_AMBULATORY_CARE_PROVIDER_SITE_OTHER): Payer: 59 | Admitting: Internal Medicine

## 2014-10-10 VITALS — BP 110/76 | HR 120 | Ht 66.5 in | Wt 218.0 lb

## 2014-10-10 DIAGNOSIS — R0602 Shortness of breath: Secondary | ICD-10-CM

## 2014-10-10 DIAGNOSIS — R002 Palpitations: Secondary | ICD-10-CM | POA: Diagnosis not present

## 2014-10-10 DIAGNOSIS — R000 Tachycardia, unspecified: Secondary | ICD-10-CM | POA: Diagnosis not present

## 2014-10-10 NOTE — Progress Notes (Signed)
Cardiology Office Note   Date:  10/10/2014   ID:  Dana Mcpherson, DOB [birth year], MRN [id number]  PCP:  Purvis Kilts, MD  Cardiologist:   Dorris Carnes, MD   No chief complaint on file.     History of Present Illness: Dana Mcpherson is a 24 y.o. female referred for evaluation of tachycardia  G3 P1  Ab 1  Previous pregancy had preeclampsia right at delivery  Fine after   She did not have palpitations with this pregnancy or at other times  Episodes of heart racing began about [redacted] wks gestation.  Not daily.  1 or 2 times per week.  Can last hours. HR goes up and down  Will be at 120 while sitting  No dizzness  No syncope HR on finger monitor was 150  Not dizzy   When heart is racing she feels like can't breathe Episodes start gradually  End gradually   Feels ok afterwards  Sometimes a little tired   Fluid intake:  Drinks water throughout day  6 to8 bottles    Current Outpatient Prescriptions  Medication Sig Dispense Refill  . aspirin 81 MG tablet Take 81 mg by mouth daily.    . Doxylamine-Pyridoxine (DICLEGIS) 10-10 MG TBEC Take 2 tablets by mouth at bedtime. May take an additional tablet in the morning and an additional tablet in the afternoon prn nausea 120 tablet 1  . omeprazole (PRILOSEC) 20 MG capsule Take 1 capsule (20 mg total) by mouth 2 (two) times daily before a meal. 60 capsule 5  . Prenat-FeFum-FePo-FA-Omega 3 (CONCEPT DHA) 53.5-38-1 MG CAPS Take 1 capsule by mouth daily. 30 capsule 11   No current facility-administered medications for this visit.    Allergies:   Other; Shellfish allergy; Adhesive; and Eggs or egg-derived products   Past Medical History  Diagnosis Date  . Allergy   . Migraine headache   . Polycystic ovarian syndrome   . Pregnancy induced hypertension   . Vaginal Pap smear, abnormal   . Complication of anesthesia     woke up during surgery & ithing after surgery    Past Surgical History  Procedure Laterality Date  . Cholecystectomy        Social History:  The patient  reports that she has quit smoking. She has never used smokeless tobacco. She reports that she does not drink alcohol or use illicit drugs.   Family History:  The patient's family history includes Cancer in her paternal grandmother.    ROS:  Please see the history of present illness. All other systems are reviewed and  Negative to the above problem except as noted.    PHYSICAL EXAM: VS:  BP 110/76 mmHg  Pulse 120  Ht 5' 6.5" (1.689 m)  Wt 218 lb (98.884 kg)  BMI 34.66 kg/m2  SpO2 98%  LMP 02/13/2014 (Exact Date)  GEN: Well nourished, well developed, in no acute distress HEENT: normal Neck: no JVD, carotid bruits, or masses Cardiac: RRR; no murmurs, rubs, or gallops,no edema  Respiratory:  clear to auscultation bilaterally, normal work of breathing GI: soft, nontender, nondistended, + BS  No hepatomegaly  MS: no deformity Moving all extremities   Skin: warm and dry, no rash Neuro:  Strength and sensation are intact Psych: euthymic mood, full affect   EKG:  EKG is ordered today.  SInus tachycardia 115 bpm     Lipid Panel No results found for: CHOL, TRIG, HDL, CHOLHDL, VLDL, LDLCALC, LDLDIRECT    Wt  Readings from Last 3 Encounters:  10/10/14 218 lb (98.884 kg)  09/30/14 216 lb (97.977 kg)  09/10/14 211 lb 6.4 oz (95.89 kg)      ASSESSMENT AND PLAN:  1  Tachycardia  I am not convinced that this represents anything more than sinus tachycardia  SLow on and off.   And, not hemodynamically destabilizing.  With some SOB with spells I would recomm an echo.   If normal then I would recomm adequate hydration and activities as tolerated.  I would not pursue further testing WIll review blood work that has been done (Hgb).   Signed, Dorris Carnes, MD  10/10/2014 1:26 PM    [facility] [address], [place], [place]  [address] Phone: [phone]; Fax: [phone]

## 2014-10-10 NOTE — Patient Instructions (Signed)
.  Your physician recommends that you schedule a follow-up appointment in: to be determined after echo    Your physician has requested that you have an echocardiogram. Echocardiography is a painless test that uses sound waves to create images of your heart. It provides your doctor with information about the size and shape of your heart and how well your heart's chambers and valves are working. This procedure takes approximately one hour. There are no restrictions for this procedure.         Thank you for choosing [facility] !         

## 2014-10-13 ENCOUNTER — Ambulatory Visit (HOSPITAL_COMMUNITY)
Admission: RE | Admit: 2014-10-13 | Discharge: 2014-10-13 | Disposition: A | Discharge status: Home / Self Care | Payer: 59 | Source: Ambulatory Visit | Attending: Internal Medicine | Admitting: Internal Medicine

## 2014-10-13 DIAGNOSIS — R002 Palpitations: Secondary | ICD-10-CM | POA: Diagnosis not present

## 2014-10-13 DIAGNOSIS — R0602 Shortness of breath: Secondary | ICD-10-CM | POA: Diagnosis not present

## 2014-10-14 ENCOUNTER — Ambulatory Visit (INDEPENDENT_AMBULATORY_CARE_PROVIDER_SITE_OTHER): Payer: 59 | Admitting: Women's Health

## 2014-10-14 ENCOUNTER — Encounter: Payer: Self-pay | Admitting: Women's Health

## 2014-10-14 VITALS — BP 132/64 | HR 76 | Wt 219.0 lb

## 2014-10-14 DIAGNOSIS — Z3493 Encounter for supervision of normal pregnancy, unspecified, third trimester: Secondary | ICD-10-CM

## 2014-10-14 DIAGNOSIS — R000 Tachycardia, unspecified: Secondary | ICD-10-CM

## 2014-10-14 DIAGNOSIS — Z331 Pregnant state, incidental: Secondary | ICD-10-CM

## 2014-10-14 DIAGNOSIS — Z1389 Encounter for screening for other disorder: Secondary | ICD-10-CM

## 2014-10-14 LAB — POCT URINALYSIS DIPSTICK
Glucose, UA: NEGATIVE
Ketones, UA: NEGATIVE
NITRITE UA: NEGATIVE
PROTEIN UA: NEGATIVE
RBC UA: NEGATIVE

## 2014-10-14 NOTE — Patient Instructions (Signed)
Call the office [phone]) or go to [facility] if:  You begin to have strong, frequent contractions  Your water breaks.  Sometimes it is a big gush of fluid, sometimes it is just a trickle that keeps getting your panties wet or running down your legs  You have vaginal bleeding.  It is normal to have a small amount of spotting if your cervix was checked.   You don't feel your baby moving like normal.  If you don't, get you something to eat and drink and lay down and focus on feeling your baby move.  You should feel at least 10 movements in 2 hours.  If you don't, you should call the office or go to [facility] to Help You Sleep Better:   Get into a bedtime routine, try to do the same thing every night before going to bed to try to help your body wind down  Warm baths  Avoid caffeine for at least 3 hours before going to sleep   Keep your room at a slightly cooler temperature, can try running a fan  Turn off TV, lights, phone, electronics  Lots of pillows if needed to help you get comfortable  Lavender scented items can help you sleep. You can place lavender essential oil on a cotton ball and place under your pillowcase, or place in a diffuser. [employer] has a lavender scented sleep line (plug-ins, sprays, etc). Look in the pillow aisle for lavender scented pillows.   If none of the above things help, you can try 1/2 of a benadryl, unisom, or tylenol pm. Do not take this every night, only when you really need it.    Preterm Labor Information Preterm labor is when labor starts at less than 37 weeks of pregnancy. The normal length of a pregnancy is 39 to 41 weeks. CAUSES Often, there is no identifiable underlying cause as to why a woman goes into preterm labor. One of the most common known causes of preterm labor is infection. Infections of the uterus, cervix, vagina, amniotic sac, bladder, kidney, or even the lungs (pneumonia) can cause labor to start. Other suspected  causes of preterm labor include:  63. Urogenital infections, such as yeast infections and bacterial vaginosis.  14. Uterine abnormalities (uterine shape, uterine septum, fibroids, or bleeding from the placenta).  15. A cervix that has been operated on (it may fail to stay closed).  16. Malformations in the fetus.  69. Multiple gestations (twins, triplets, and so on).  18. Breakage of the amniotic sac.  RISK FACTORS  Having a previous history of preterm labor.   Having premature rupture of membranes (PROM).   Having a placenta that covers the opening of the cervix (placenta previa).   Having a placenta that separates from the uterus (placental abruption).   Having a cervix that is too weak to hold the fetus in the uterus (incompetent cervix).   Having too much fluid in the amniotic sac (polyhydramnios).   Taking illegal drugs or smoking while pregnant.   Not gaining enough weight while pregnant.   Being younger than 37 and older than 26 years old.   Having a low socioeconomic status.   Being African American. SYMPTOMS Signs and symptoms of preterm labor include:   Menstrual-like cramps, abdominal pain, or back pain.  Uterine contractions that are regular, as frequent as six in an hour, regardless of their intensity (may be mild or painful).  Contractions that start on the top of the  uterus and spread down to the lower abdomen and back.   A sense of increased pelvic pressure.   A watery or bloody mucus discharge that comes from the vagina.  TREATMENT Depending on the length of the pregnancy and other circumstances, your health care provider may suggest bed rest. If necessary, there are medicines that can be given to stop contractions and to mature the fetal lungs. If labor happens before 34 weeks of pregnancy, a prolonged hospital stay may be recommended. Treatment depends on the condition of both you and the fetus.  WHAT SHOULD YOU DO IF YOU THINK YOU ARE  IN PRETERM LABOR? Call your health care provider right away. You will need to go to the hospital to get checked immediately. HOW CAN YOU PREVENT PRETERM LABOR IN FUTURE PREGNANCIES? You should:   Stop smoking if you smoke.  Maintain healthy weight gain and avoid chemicals and drugs that are not necessary.  Be watchful for any type of infection.  Inform your health care provider if you have a known history of preterm labor. Document Released: 07/02/2003 Document Revised: 12/12/2012 Document Reviewed: 05/14/2012 [facility] Patient Information 2015 [place], [place]. This information is not intended to replace advice given to you by your health care provider. Make sure you discuss any questions you have with your health care provider.

## 2014-10-14 NOTE — Progress Notes (Signed)
Low-risk OB appointment G3P1011 [redacted]w[redacted]d Estimated Date of Delivery: 12/06/14 BP 132/64 mmHg  Pulse 76  Wt 219 lb (99.338 kg)  LMP 02/13/2014 (Exact Date)  BP, weight, and urine reviewed.  Refer to obstetrical flow sheet for FH & FHR.  Reports good fm.  Denies regular uc's, lof, vb, or uti s/s. Had Holter monitor & appt w/ cards for episodes of tachycardia up to 150s- avg hr 106, ekg sinus tach @ 115, normal echo, card recommended increasing fluids, activity as tolerated, no further testing or f/u needed. Pt states hard time sleeping d/t tachycardia- info on sleep tips given. To let us know if sx worsen.   Reviewed ptl s/s, fkc, pn2 results. Plan:  Continue routine obstetrical care  F/U in 2wks for OB appointment

## 2014-10-24 ENCOUNTER — Encounter (HOSPITAL_COMMUNITY): Payer: Self-pay | Admitting: *Deleted

## 2014-10-24 ENCOUNTER — Inpatient Hospital Stay (HOSPITAL_COMMUNITY)
Admission: AD | Admit: 2014-10-24 | Discharge: 2014-10-24 | Disposition: A | Discharge status: Home / Self Care | Payer: 59 | Source: Ambulatory Visit | Attending: Family Medicine | Admitting: Family Medicine

## 2014-10-24 DIAGNOSIS — O9989 Other specified diseases and conditions complicating pregnancy, childbirth and the puerperium: Secondary | ICD-10-CM | POA: Diagnosis not present

## 2014-10-24 DIAGNOSIS — R896 Abnormal cytological findings in specimens from other organs, systems and tissues: Secondary | ICD-10-CM | POA: Diagnosis not present

## 2014-10-24 DIAGNOSIS — R0602 Shortness of breath: Secondary | ICD-10-CM | POA: Insufficient documentation

## 2014-10-24 DIAGNOSIS — Z3A33 33 weeks gestation of pregnancy: Secondary | ICD-10-CM | POA: Insufficient documentation

## 2014-10-24 DIAGNOSIS — Z87891 Personal history of nicotine dependence: Secondary | ICD-10-CM | POA: Insufficient documentation

## 2014-10-24 DIAGNOSIS — R42 Dizziness and giddiness: Secondary | ICD-10-CM | POA: Diagnosis present

## 2014-10-24 DIAGNOSIS — IMO0002 Reserved for concepts with insufficient information to code with codable children: Secondary | ICD-10-CM

## 2014-10-24 DIAGNOSIS — Z3493 Encounter for supervision of normal pregnancy, unspecified, third trimester: Secondary | ICD-10-CM

## 2014-10-24 DIAGNOSIS — Z3492 Encounter for supervision of normal pregnancy, unspecified, second trimester: Secondary | ICD-10-CM

## 2014-10-24 LAB — URINE MICROSCOPIC-ADD ON

## 2014-10-24 LAB — URINALYSIS, ROUTINE W REFLEX MICROSCOPIC
BILIRUBIN URINE: NEGATIVE
Glucose, UA: NEGATIVE mg/dL
HGB URINE DIPSTICK: NEGATIVE
KETONES UR: NEGATIVE mg/dL
Nitrite: NEGATIVE
Protein, ur: NEGATIVE mg/dL
Specific Gravity, Urine: <1.005 — ABNORMAL LOW (ref 1.005–1.030)
UROBILINOGEN UA: 0.2 mg/dL (ref 0.0–1.0)
pH: 6 (ref 5.0–8.0)

## 2014-10-24 NOTE — MAU Note (Signed)
Got really really hot this morning. Feel really, really exhausted and dizzy. Having a hard time breathing, has been to cardiologist.  Heart rate is up because of pregnancy and nothing they can really do about it.

## 2014-10-24 NOTE — MAU Provider Note (Signed)
History     CSN: [id number]  Arrival date and time: 10/24/14 1805   First Provider Initiated Contact with Patient 10/24/14 1915      Chief Complaint  Patient presents with  . Fatigue  . Hot Flashes  . Nausea   HPI  Patient is 24 y.o. [id number] [redacted]w[redacted]d here with complaints of feeling hot, dizzy and SOB x1 day. Tried tylenol with no relief. Also complains of "geographic tongue" syndrome.  +FM, denies LOF, VB, contractions, vaginal discharge.     Past Medical History  Diagnosis Date  . Allergy   . Migraine headache   . Polycystic ovarian syndrome   . Pregnancy induced hypertension   . Vaginal Pap smear, abnormal   . Complication of anesthesia     woke up during surgery & ithing after surgery    Past Surgical History  Procedure Laterality Date  . Cholecystectomy      Family History  Problem Relation Age of Onset  . Cancer Paternal Grandmother     ovarian cancer    History  Substance Use Topics  . Smoking status: Former Research scientist (life sciences)  . Smokeless tobacco: Never Used  . Alcohol Use: No     Comment: occasional    Allergies:  Allergies  Allergen Reactions  . Other Anaphylaxis    Axe and Tag body spray, lysol  . Shellfish Allergy Anaphylaxis and Hives  . Adhesive [Tape] Hives  . Eggs Or Egg-Derived Products Itching and Nausea Only    Prescriptions prior to admission  Medication Sig Dispense Refill Last Dose  . acetaminophen (TYLENOL) 325 MG tablet Take 650 mg by mouth every 6 (six) hours as needed for moderate pain.   10/23/2014 at Unknown time  . aspirin 81 MG tablet Take 81 mg by mouth daily.   10/23/2014 at Unknown time  . Doxylamine-Pyridoxine (DICLEGIS) 10-10 MG TBEC Take 2 tablets by mouth at bedtime. May take an additional tablet in the morning and an additional tablet in the afternoon prn nausea 120 tablet 1 10/23/2014 at Unknown time  . omeprazole (PRILOSEC) 20 MG capsule Take 1 capsule (20 mg total) by mouth 2 (two) times daily before a meal. 60 capsule 5  10/23/2014 at Unknown time  . Prenat-FeFum-FePo-FA-Omega 3 (CONCEPT DHA) 53.5-38-1 MG CAPS Take 1 capsule by mouth daily. 30 capsule 11 10/23/2014 at Unknown time    Review of Systems  Constitutional: Positive for malaise/fatigue. Negative for fever and chills.  HENT: Negative for congestion.   Eyes: Negative for blurred vision and double vision.  Respiratory: Negative for cough and shortness of breath.   Cardiovascular: Negative for chest pain, palpitations, claudication and leg swelling.  Gastrointestinal: Positive for nausea. Negative for heartburn, vomiting, abdominal pain, diarrhea and constipation.  Genitourinary: Negative for dysuria and hematuria.  Musculoskeletal: Negative for myalgias and back pain.  Skin: Negative for itching and rash.  Neurological: Positive for dizziness. Negative for loss of consciousness and headaches.   Physical Exam   Blood pressure 106/59, pulse 99, temperature 98.3 F (36.8 C), temperature source Oral, resp. rate 16, last menstrual period 02/13/2014, SpO2 98 %.  Physical Exam  Constitutional: She is oriented to person, place, and time. She appears well-developed and well-nourished. No distress.  HENT:  Head: Normocephalic and atraumatic.  Eyes: Conjunctivae and EOM are normal.  Neck: Normal range of motion. No thyromegaly present.  Cardiovascular: Regular rhythm and normal heart sounds.  Exam reveals no gallop and no friction rub.   No murmur heard. tachycardic  Respiratory: Breath sounds  normal. No respiratory distress. She has no wheezes. She has no rales.  GI: Soft. Bowel sounds are normal. She exhibits no distension. There is no tenderness.  Musculoskeletal: Normal range of motion. She exhibits no edema.  Neurological: She is alert and oriented to person, place, and time.  Skin: Skin is warm and dry. No rash noted. No erythema.  Psychiatric: She has a normal mood and affect. Her behavior is normal.    MAU Course  Procedures  MDM NST is  reactive. UA nml, no signs of dehydration. Chart reviewed.   Assessment and Plan  Patient is 24 y.o. G3P1011 [redacted]w[redacted]d reporting hot flashes and SOB likely secondary to normal discomforts of pregnancy.  - fetal kick counts reinforced - preterm labor precautions -no signs of hyperthyroidism or worsening of her tachycardia   Melina Schools 10/24/2014, 7:17 PM   OB fellow attestation:  I have seen and examined this patient; I agree with above documentation in the resident's note.   [place] N Hepp is a 24 y.o. G3P1011 reporting hot flashes, dizziness, geographic tongue +FM, denies LOF, VB, contractions, vaginal discharge.  PE: BP 106/59 mmHg  Pulse 99  Temp(Src) 98.3 F (36.8 C) (Oral)  Resp 16  SpO2 98%  LMP 02/13/2014 (Exact Date) Gen: calm comfortable, NAD Resp: normal effort, no distress Abd: gravid  ROS, labs, PMH reviewed NST reactive  Plan:  - fetal kick counts reinforced, preterm labor precautions - no signs of hyperthyroidism or worsening tachycardia - continue routine follow up in OB clinic - PO hydration encouraged  Merla Riches, MD 4:14 PM

## 2014-10-24 NOTE — Discharge Instructions (Signed)
Third Trimester of Pregnancy The third trimester is from week 29 through week 42, months 7 through 9. This trimester is when your unborn baby (fetus) is growing very fast. At the end of the ninth month, the unborn baby is about 20 inches in length. It weighs about 6-10 pounds.  HOME CARE   Avoid all smoking, herbs, and alcohol. Avoid drugs not approved by your doctor.  Only take medicine as told by your doctor. Some medicines are safe and some are not during pregnancy.  Exercise only as told by your doctor. Stop exercising if you start having cramps.  Eat regular, healthy meals.  Wear a good support bra if your breasts are tender.  Do not use hot tubs, steam rooms, or saunas.  Wear your seat belt when driving.  Avoid raw meat, uncooked cheese, and liter boxes and soil used by cats.  Take your prenatal vitamins.  Try taking medicine that helps you poop (stool softener) as needed, and if your doctor approves. Eat more fiber by eating fresh fruit, vegetables, and whole grains. Drink enough fluids to keep your pee (urine) clear or pale yellow.  Take warm water baths (sitz baths) to soothe pain or discomfort caused by hemorrhoids. Use hemorrhoid cream if your doctor approves.  If you have puffy, bulging veins (varicose veins), wear support hose. Raise (elevate) your feet for 15 minutes, 3-4 times a day. Limit salt in your diet.  Avoid heavy lifting, wear low heels, and sit up straight.  Rest with your legs raised if you have leg cramps or low back pain.  Visit your dentist if you have not gone during your pregnancy. Use a soft toothbrush to brush your teeth. Be gentle when you floss.  You can have sex (intercourse) unless your doctor tells you not to.  Do not travel far distances unless you must. Only do so with your doctor's approval.  Take prenatal classes.  Practice driving to the hospital.  Pack your hospital bag.  Prepare the baby's room.  Go to your doctor visits. GET  HELP IF:  You are not sure if you are in labor or if your water has broken.  You are dizzy.  You have mild cramps or pressure in your lower belly (abdominal).  You have a nagging pain in your belly area.  You continue to feel sick to your stomach (nauseous), throw up (vomit), or have watery poop (diarrhea).  You have bad smelling fluid coming from your vagina.  You have pain with peeing (urination). GET HELP RIGHT AWAY IF:   You have a fever.  You are leaking fluid from your vagina.  You are spotting or bleeding from your vagina.  You have severe belly cramping or pain.  You lose or gain weight rapidly.  You have trouble catching your breath and have chest pain.  You notice sudden or extreme puffiness (swelling) of your face, hands, ankles, feet, or legs.  You have not felt the baby move in over an hour.  You have severe headaches that do not go away with medicine.  You have vision changes. Document Released: 07/06/2009 Document Revised: 08/06/2012 Document Reviewed: 06/12/2012 [facility] Patient Information 2015 [place], [place]. This information is not intended to replace advice given to you by your health care provider. Make sure you discuss any questions you have with your health care provider.

## 2014-10-24 NOTE — MAU Note (Signed)
C/o "not feeling good"; "I feel hot"; c/o SOB sometimes;

## 2014-10-29 ENCOUNTER — Encounter: Payer: 59 | Admitting: Women's Health

## 2014-10-30 ENCOUNTER — Ambulatory Visit (INDEPENDENT_AMBULATORY_CARE_PROVIDER_SITE_OTHER): Payer: 59 | Admitting: Obstetrics & Gynecology

## 2014-10-30 ENCOUNTER — Encounter: Payer: Self-pay | Admitting: Obstetrics & Gynecology

## 2014-10-30 VITALS — BP 100/60 | HR 72 | Wt 221.0 lb

## 2014-10-30 DIAGNOSIS — Z3493 Encounter for supervision of normal pregnancy, unspecified, third trimester: Secondary | ICD-10-CM

## 2014-10-30 DIAGNOSIS — Z331 Pregnant state, incidental: Secondary | ICD-10-CM

## 2014-10-30 DIAGNOSIS — Z1389 Encounter for screening for other disorder: Secondary | ICD-10-CM

## 2014-10-30 LAB — POCT URINALYSIS DIPSTICK
Blood, UA: NEGATIVE
GLUCOSE UA: NEGATIVE
KETONES UA: NEGATIVE
NITRITE UA: NEGATIVE
Protein, UA: NEGATIVE

## 2014-10-30 NOTE — Progress Notes (Signed)
[id number] [redacted]w[redacted]d Estimated Date of Delivery: 12/06/14  Blood pressure 100/60, pulse 72, weight 221 lb (100.245 kg), last menstrual period 02/13/2014.   BP weight and urine results all reviewed and noted.  Please refer to the obstetrical flow sheet for the fundal height and fetal heart rate documentation:  Patient reports good fetal movement, denies any bleeding and no rupture of membranes symptoms or regular contractions. Patient is without complaints. All questions were answered.  Plan:  Continued routine obstetrical care,   Follow up in 2 weeks for OB appointment, 2

## 2014-10-30 NOTE — Addendum Note (Signed)
Addended by: Doyne Keel on: 10/30/2014 11:37 AM   Modules accepted: Orders

## 2014-11-04 ENCOUNTER — Observation Stay (HOSPITAL_COMMUNITY)
Admission: AD | Admit: 2014-11-04 | Discharge: 2014-11-05 | Disposition: A | Discharge status: Home / Self Care | Payer: 59 | Source: Ambulatory Visit | Attending: Family Medicine | Admitting: Family Medicine

## 2014-11-04 ENCOUNTER — Ambulatory Visit (INDEPENDENT_AMBULATORY_CARE_PROVIDER_SITE_OTHER): Payer: 59 | Admitting: Women's Health

## 2014-11-04 ENCOUNTER — Inpatient Hospital Stay (HOSPITAL_COMMUNITY): Payer: 59

## 2014-11-04 ENCOUNTER — Encounter (HOSPITAL_COMMUNITY): Payer: Self-pay | Admitting: *Deleted

## 2014-11-04 ENCOUNTER — Telehealth: Payer: Self-pay | Admitting: *Deleted

## 2014-11-04 VITALS — BP 132/88 | HR 122

## 2014-11-04 DIAGNOSIS — Z7982 Long term (current) use of aspirin: Secondary | ICD-10-CM | POA: Insufficient documentation

## 2014-11-04 DIAGNOSIS — Z3A35 35 weeks gestation of pregnancy: Secondary | ICD-10-CM | POA: Diagnosis not present

## 2014-11-04 DIAGNOSIS — R42 Dizziness and giddiness: Secondary | ICD-10-CM

## 2014-11-04 DIAGNOSIS — O3483 Maternal care for other abnormalities of pelvic organs, third trimester: Secondary | ICD-10-CM | POA: Insufficient documentation

## 2014-11-04 DIAGNOSIS — O99343 Other mental disorders complicating pregnancy, third trimester: Secondary | ICD-10-CM | POA: Diagnosis not present

## 2014-11-04 DIAGNOSIS — R000 Tachycardia, unspecified: Secondary | ICD-10-CM | POA: Diagnosis not present

## 2014-11-04 DIAGNOSIS — Z87891 Personal history of nicotine dependence: Secondary | ICD-10-CM | POA: Diagnosis not present

## 2014-11-04 DIAGNOSIS — R748 Abnormal levels of other serum enzymes: Secondary | ICD-10-CM | POA: Diagnosis not present

## 2014-11-04 DIAGNOSIS — R0602 Shortness of breath: Secondary | ICD-10-CM | POA: Insufficient documentation

## 2014-11-04 DIAGNOSIS — F419 Anxiety disorder, unspecified: Secondary | ICD-10-CM | POA: Insufficient documentation

## 2014-11-04 DIAGNOSIS — R079 Chest pain, unspecified: Secondary | ICD-10-CM

## 2014-11-04 DIAGNOSIS — O26893 Other specified pregnancy related conditions, third trimester: Secondary | ICD-10-CM | POA: Diagnosis not present

## 2014-11-04 DIAGNOSIS — Z3493 Encounter for supervision of normal pregnancy, unspecified, third trimester: Secondary | ICD-10-CM

## 2014-11-04 DIAGNOSIS — Z1389 Encounter for screening for other disorder: Secondary | ICD-10-CM

## 2014-11-04 DIAGNOSIS — E282 Polycystic ovarian syndrome: Secondary | ICD-10-CM | POA: Diagnosis not present

## 2014-11-04 DIAGNOSIS — Z331 Pregnant state, incidental: Secondary | ICD-10-CM

## 2014-11-04 DIAGNOSIS — O368131 Decreased fetal movements, third trimester, fetus 1: Secondary | ICD-10-CM | POA: Diagnosis not present

## 2014-11-04 DIAGNOSIS — R112 Nausea with vomiting, unspecified: Secondary | ICD-10-CM

## 2014-11-04 HISTORY — DX: Anxiety disorder, unspecified: F41.9

## 2014-11-04 LAB — CBC
HEMATOCRIT: 34.8 % — AB (ref 36.0–46.0)
Hemoglobin: 11.7 g/dL — ABNORMAL LOW (ref 12.0–15.0)
MCH: 29 pg (ref 26.0–34.0)
MCHC: 33.6 g/dL (ref 30.0–36.0)
MCV: 86.4 fL (ref 78.0–100.0)
PLATELETS: 480 10*3/uL — AB (ref 150–400)
RBC: 4.03 MIL/uL (ref 3.87–5.11)
RDW: 14 % (ref 11.5–15.5)
WBC: 18.2 10*3/uL — AB (ref 4.0–10.5)

## 2014-11-04 LAB — RAPID URINE DRUG SCREEN, HOSP PERFORMED
AMPHETAMINES: NOT DETECTED
BARBITURATES: NOT DETECTED
Benzodiazepines: NOT DETECTED
Cocaine: NOT DETECTED
OPIATES: NOT DETECTED
Tetrahydrocannabinol: NOT DETECTED

## 2014-11-04 LAB — URINALYSIS, ROUTINE W REFLEX MICROSCOPIC
Bilirubin Urine: NEGATIVE
Glucose, UA: NEGATIVE mg/dL
Hgb urine dipstick: NEGATIVE
Ketones, ur: 40 mg/dL — AB
Leukocytes, UA: NEGATIVE
NITRITE: NEGATIVE
PH: 7 (ref 5.0–8.0)
Protein, ur: NEGATIVE mg/dL
Specific Gravity, Urine: <1.005 — ABNORMAL LOW (ref 1.005–1.030)
UROBILINOGEN UA: 0.2 mg/dL (ref 0.0–1.0)

## 2014-11-04 LAB — BASIC METABOLIC PANEL
Anion gap: 11 (ref 5–15)
BUN: 6 mg/dL (ref 6–20)
CO2: 18 mmol/L — ABNORMAL LOW (ref 22–32)
CREATININE: 0.5 mg/dL (ref 0.44–1.00)
Calcium: 9 mg/dL (ref 8.9–10.3)
Chloride: 104 mmol/L (ref 101–111)
GFR calc Af Amer: >60 mL/min (ref >60)
GFR calc non Af Amer: >60 mL/min (ref >60)
Glucose, Bld: 86 mg/dL (ref 65–99)
Potassium: 4.1 mmol/L (ref 3.5–5.1)
Sodium: 133 mmol/L — ABNORMAL LOW (ref 135–145)

## 2014-11-04 LAB — TROPONIN I: Troponin I: 0.15 ng/mL — ABNORMAL HIGH (ref <0.031)

## 2014-11-04 IMAGING — CT CT ANGIO CHEST
1 of 3 series · 19 of 33 positions shown · IV contrast (OMNIPAQUE)
Comparison: Chest x-ray on [DATE]

CLINICAL DATA: Dizziness, tachycardia and shortness of breath with
intermittent left chest pain.

EXAM:
CT ANGIOGRAPHY CHEST WITH CONTRAST
TECHNIQUE: Multidetector CT imaging of the chest was performed using the
standard protocol during bolus administration of intravenous
contrast. Multiplanar CT image reconstructions and MIPs were
obtained to evaluate the vascular anatomy.
CONTRAST:  100mL OMNIPAQUE IOHEXOL 350 MG/ML SOLN

[Series 6: (person_name) thins · axial · 0.69mm/px · z∈[+886,+1141]mm · 19 of 408 slices shown]
[im 22/408  lung]
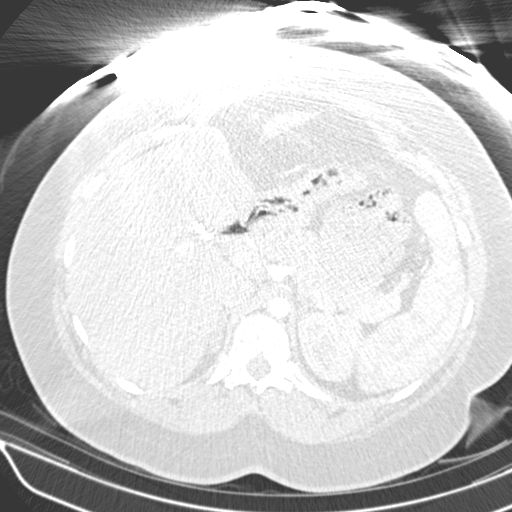
[im 43/408  mediastinal]
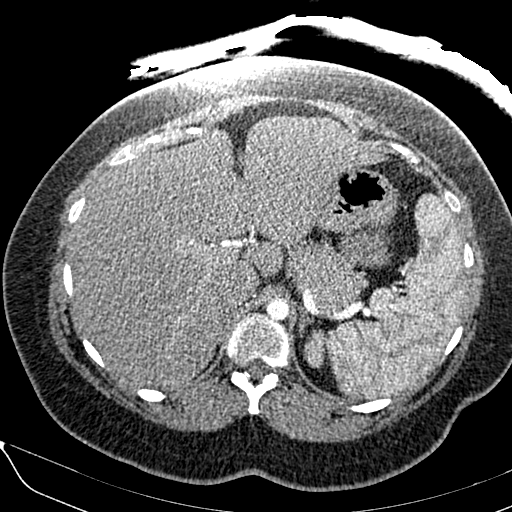
[im 65/408  lung]
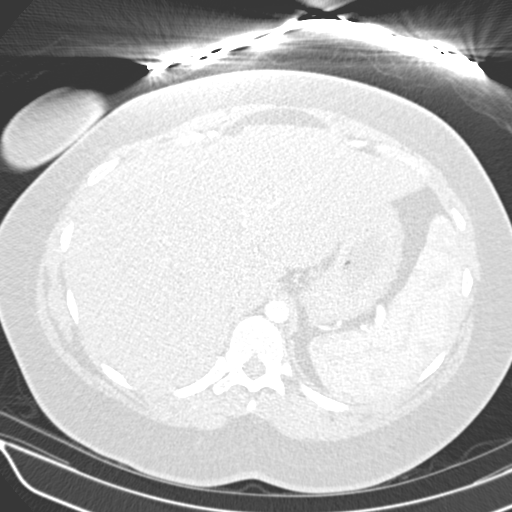
[im 86/408  mediastinal]
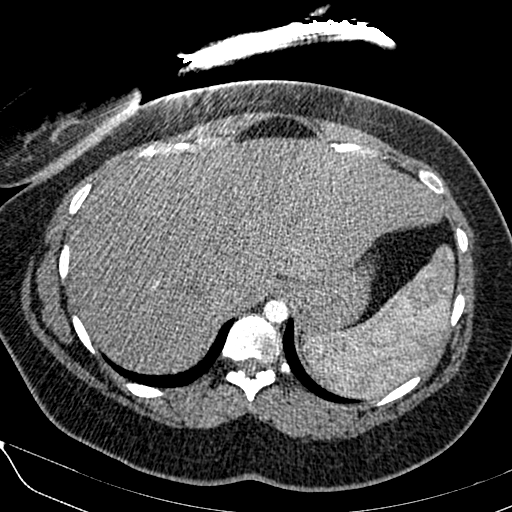
[im 108/408  lung]
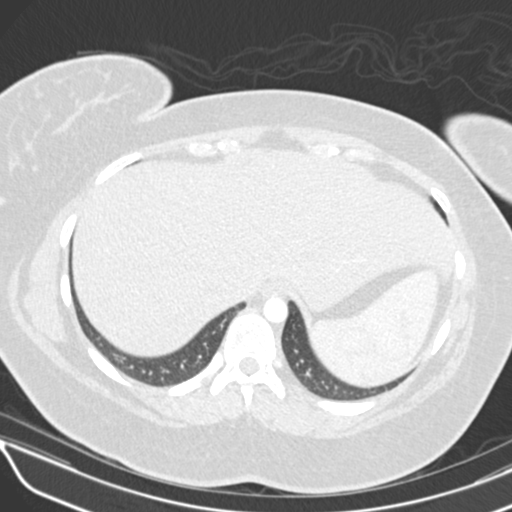
[im 136/408  mediastinal]
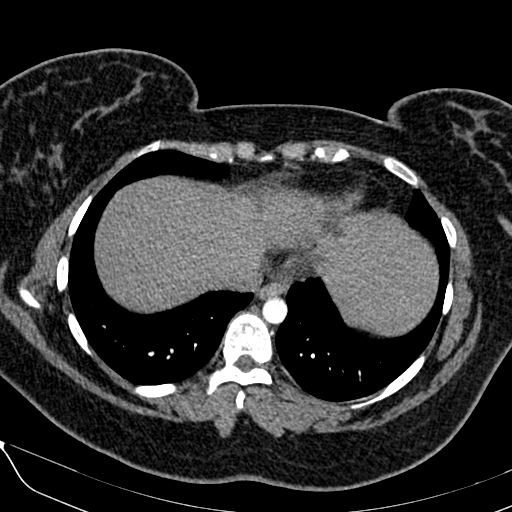
[im 150/408  lung]
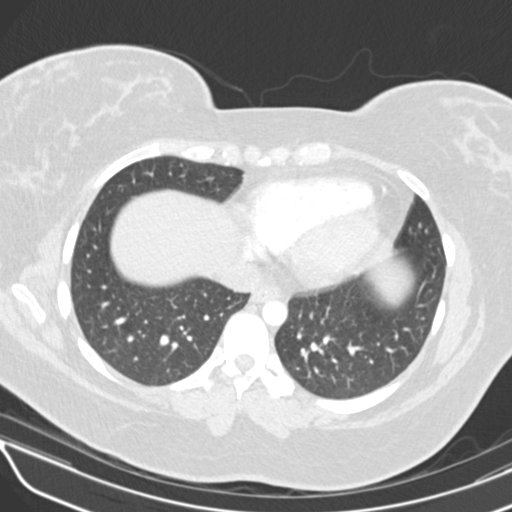
[im 172/408  mediastinal]
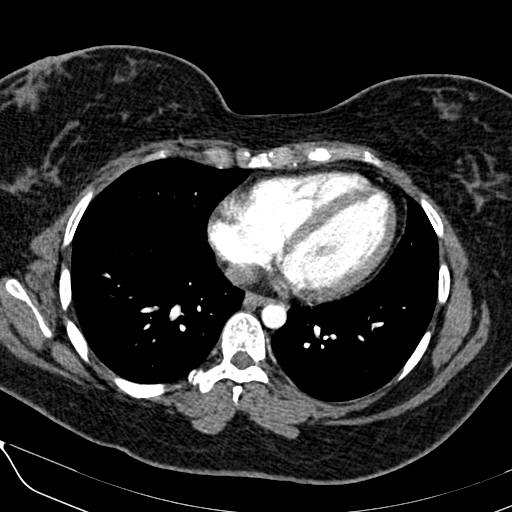
[im 191/408  lung]
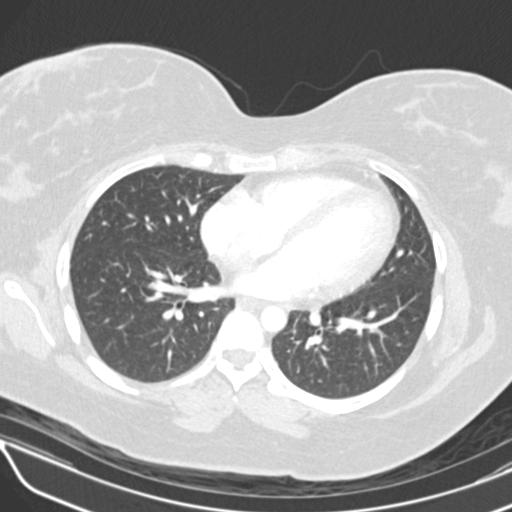
[im 193/408  mediastinal]
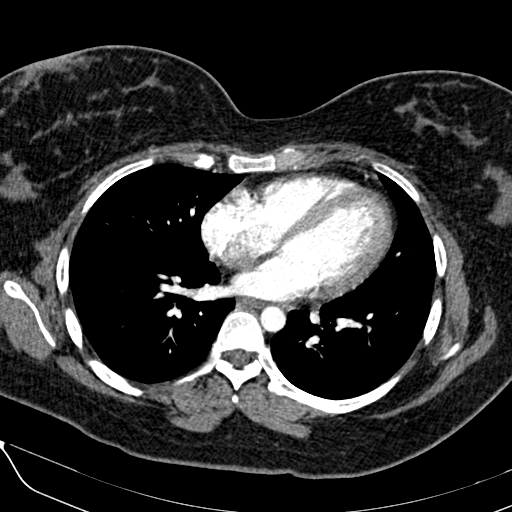
[im 215/408  lung]
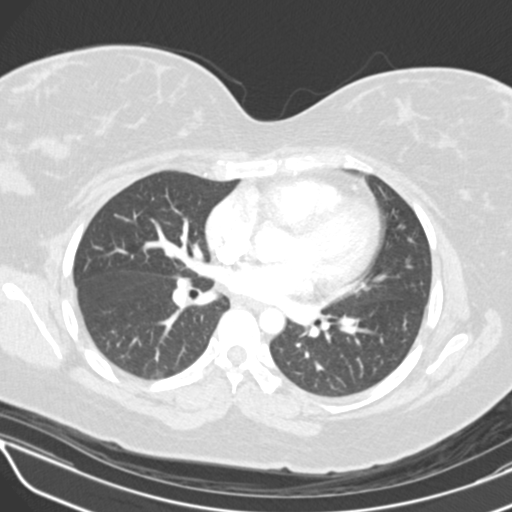
[im 236/408  mediastinal]
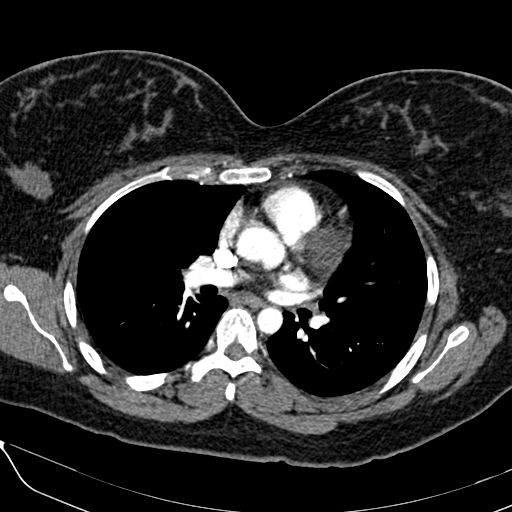
[im 258/408  lung]
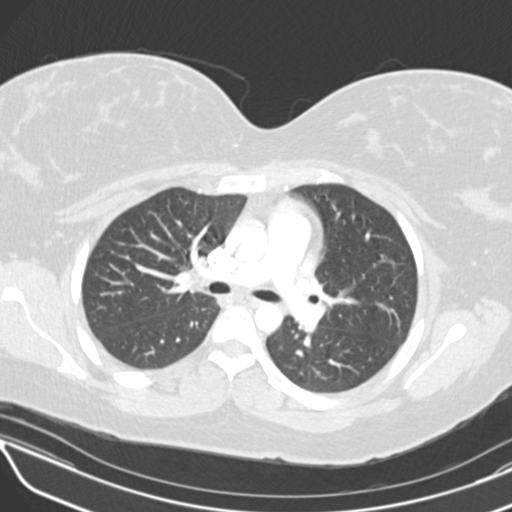
[im 272/408  mediastinal]
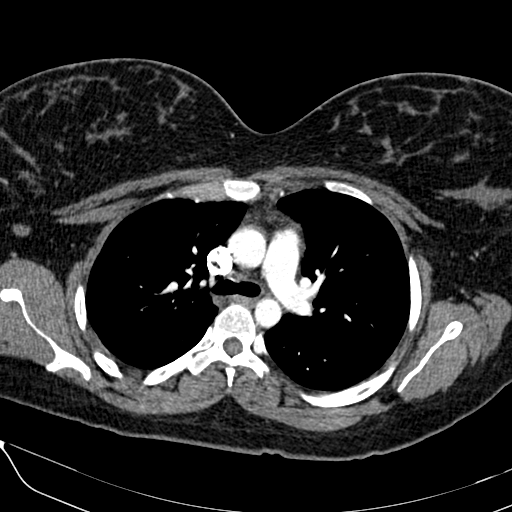
[im 300/408  lung]
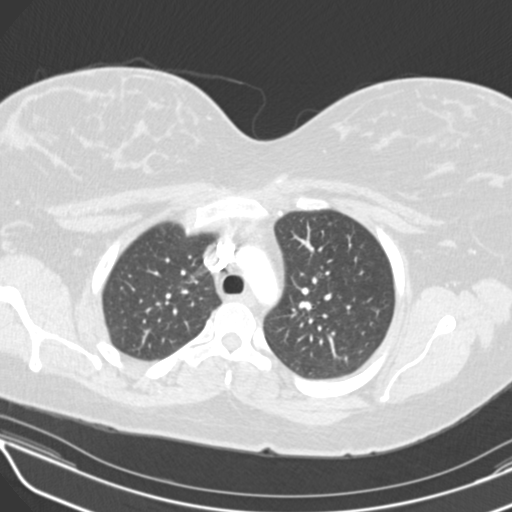
[im 322/408  mediastinal]
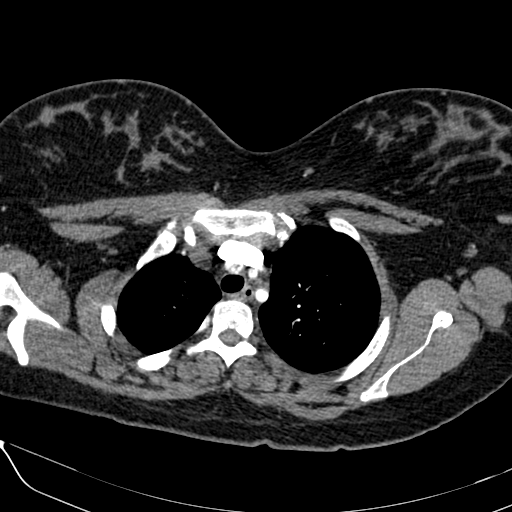
[im 343/408  lung]
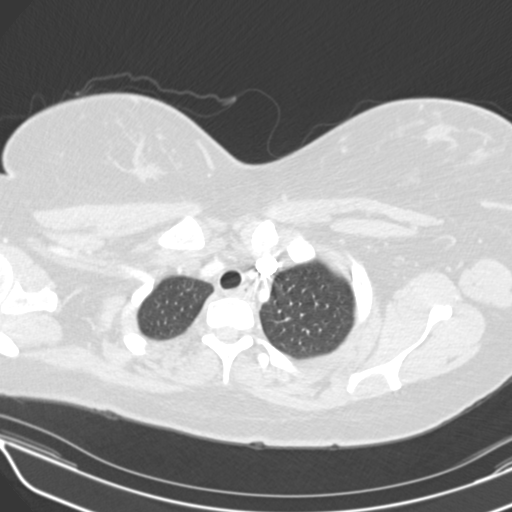
[im 365/408  mediastinal]
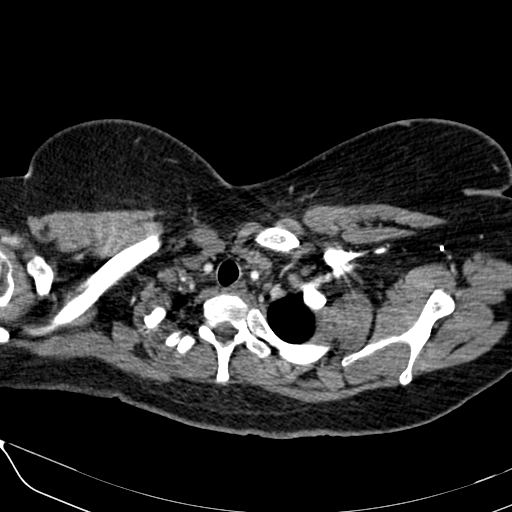
[im 386/408  lung]
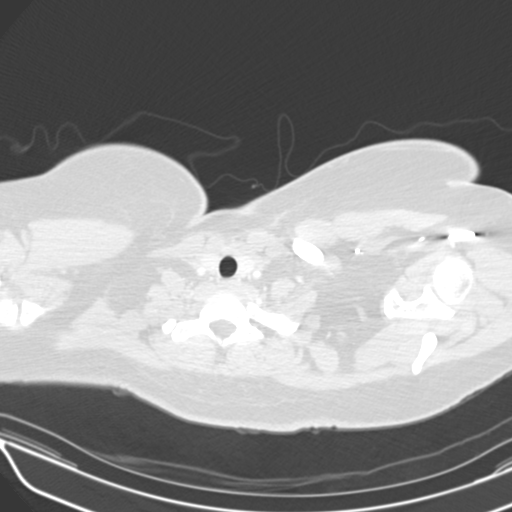

[19 of 33 positions shown; findings below may reference images not displayed]

FINDINGS: The pulmonary arteries are well opacified. There is no evidence of
pulmonary embolism. The thoracic aorta is of normal caliber and
demonstrates no evidence of aneurysm or dissection. The heart size
is normal.

There is no evidence of pulmonary edema, consolidation,
pneumothorax, nodule or pleural fluid. No pleural or pericardial
fluid is identified. No evidence of airway obstruction. No
lymphadenopathy. Visualized upper abdominal structures are
unremarkable. No bony abnormalities are seen.

Review of the MIP images confirms the above findings.
IMPRESSION: Normal CTA of the chest.  No acute findings.

## 2014-11-04 MED ORDER — ZOLPIDEM TARTRATE 5 MG PO TABS: 5.0000 mg | ORAL_TABLET | Freq: Every evening | ORAL | Status: DC | PRN

## 2014-11-04 MED ORDER — OXYCODONE-ACETAMINOPHEN 5-325 MG PO TABS
1.0000 | ORAL_TABLET | Freq: Four times a day (QID) | ORAL | Status: DC | PRN
  Administered 2014-11-04: 1 via ORAL
  Filled 2014-11-04: qty 1

## 2014-11-04 MED ORDER — LACTATED RINGERS IV SOLN
INTRAVENOUS | Status: DC
  Administered 2014-11-04 – 2014-11-05 (×2): via INTRAVENOUS

## 2014-11-04 MED ORDER — ASPIRIN 81 MG PO TABS: 81.0000 mg | ORAL_TABLET | Freq: Every day | ORAL | Status: DC

## 2014-11-04 MED ORDER — PRENATAL MULTIVITAMIN CH
1.0000 | ORAL_TABLET | Freq: Every day | ORAL | Status: DC
  Administered 2014-11-04: 1 via ORAL
  Filled 2014-11-04: qty 1

## 2014-11-04 MED ORDER — DOCUSATE SODIUM 100 MG PO CAPS
100.0000 mg | ORAL_CAPSULE | Freq: Every day | ORAL | Status: DC
  Administered 2014-11-05: 100 mg via ORAL
  Filled 2014-11-04: qty 1

## 2014-11-04 MED ORDER — PANTOPRAZOLE SODIUM 40 MG PO TBEC
40.0000 mg | DELAYED_RELEASE_TABLET | Freq: Every day | ORAL | Status: DC
  Administered 2014-11-04 – 2014-11-05 (×2): 40 mg via ORAL
  Filled 2014-11-04 (×2): qty 1

## 2014-11-04 MED ORDER — CALCIUM CARBONATE ANTACID 500 MG PO CHEW: 2.0000 | CHEWABLE_TABLET | ORAL | Status: DC | PRN

## 2014-11-04 MED ORDER — IOHEXOL 350 MG/ML SOLN
100.0000 mL | Freq: Once | INTRAVENOUS | Status: AC | PRN
  Administered 2014-11-04: 100 mL via INTRAVENOUS

## 2014-11-04 MED ORDER — LACTATED RINGERS IV BOLUS (SEPSIS)
500.0000 mL | Freq: Once | INTRAVENOUS | Status: AC
  Administered 2014-11-04: 500 mL via INTRAVENOUS

## 2014-11-04 MED ORDER — ACETAMINOPHEN 325 MG PO TABS: 650.0000 mg | ORAL_TABLET | ORAL | Status: DC | PRN

## 2014-11-04 MED ORDER — ASPIRIN EC 81 MG PO TBEC
81.0000 mg | DELAYED_RELEASE_TABLET | Freq: Every day | ORAL | Status: DC
  Administered 2014-11-04: 81 mg via ORAL
  Filled 2014-11-04 (×2): qty 1

## 2014-11-04 NOTE — MAU Note (Signed)
Radiology aware of CT order.  [employer], waiting for them to come do CTA.

## 2014-11-04 NOTE — Progress Notes (Signed)
Chart reviewed in preparation for admission to AICU

## 2014-11-04 NOTE — MAU Note (Signed)
Notified Dr. Ernestina Patches that EKG was completed, but suspected arm lead reversal with inverted t waves.  Dr. Ernestina Patches requesting repeat EKG.  Called Amy, RT back to repeat EKG.

## 2014-11-04 NOTE — Progress Notes (Signed)
Work-in Low-risk OB appointment [id number] [redacted]w[redacted]d Estimated Date of Delivery: 12/06/14 BP 132/88 mmHg  Pulse 122  LMP 02/13/2014 (Exact Date)  BP, weight, and urine reviewed.  Refer to obstetrical flow sheet for FH & FHR.  Reports decreased fm x 1 day.  Denies regular uc's, lof, vb, or uti s/s. Irregular uc's, low back pain, sob- feels like she just can't catch breath, dizziness, chest pain/pressure, nausea- has vomited 3 times today. Almost passed out while weighing.  Co-exam w/ [facility], tachycardic in 110-120s, regular rhythm, LCTAB. Some tenderness to sternal palpation. Denies reflux. Has had episodes of tachycardia/sob/cp during pregnancy- had 48hr Holter w/ avg HR 106, appt w/ cardio w/ normal echo and ekg showing sinus tach- determined no further work-up needed at that time. To [facility] via personal car per [facility] for observation/PE work-up.  NST reactive/Cat I Plan:  To [facility] for further eval/PE work-up. Called resident and notified them of pt.  F/U in 1wk if d/c'd for OB appointment

## 2014-11-04 NOTE — MAU Note (Signed)
Patient was seen at [facility] today for increased heart rate and contractions.  Patient states she feels like she is going to pass out whenever she stands up.  Patient also states her chest hurts.

## 2014-11-04 NOTE — H&P (Signed)
Dana Mcpherson is a 24 y.o. female G3P1011 at [redacted]w[redacted]d by 6wk [place] presenting for SOB, tachycardia.  Patient reports SOB, heart racing starting at ~6 months of pregnancy.  Tachycardia worsening today and associated with flushing, + N, vomiting x2.  She also reports sharp pain in L chest, radiation to L shoulder that is constant in nature.  Also reporting distal edema.  SOB is worse with exertion, including talking a lot or sitting up in bed.  Reports h/o anxiety with panic attacks since childhood, but this seems diffferent than previous panic attacks.  For the last few days, has also had lightheadedness upon standing that is associated with tinnitus, photophobia and spots in vision.  Also getting muscle cramps in LE regularly and tingling in hands and feet intermittently  No cough, wheezing, HAs.   Felt SOB, no tachycardia, +preeclampsia with first pregnancy, but only while in labor.  +FM, no VB, no LOF, no contractions.  +Physiologic discharge of pregnancy.   [place], [place], 1st baby, engaged  Dating By 6wk u/s  Pap 05/28/14: ASCUS w/ +HRHPV  Colpo 06/11/14: no visible dysplasia, repeat 33yr  GC/CT Initial:   -/-             36+wks:  Genetic Screen NT/IT: declined  CF screen neg  Anatomic [place] Normal female 'Max'  Flu vaccine Allergic to eggs  Tdap Recommended ~ 28wks  Glucose Screen  2 hr  73/100/123  GBS   Feed Preference breast  Contraception POPs-> vasectomy  Circumcision Yes, at [place] declined  Pediatrician [place]      Maternal Medical History:  Reason for admission: Nausea.    OB History    Gravida Para Term Preterm AB TAB SAB Ectopic Multiple Living   3 1 1  1  1   1       Obstetric Comments   IOL for pre-e     Past Medical History  Diagnosis Date  . Allergy   . Migraine headache   . Polycystic ovarian syndrome   . Pregnancy induced hypertension   . Vaginal Pap smear, abnormal   . Complication of anesthesia      woke up during surgery & ithing after surgery  . Anxiety    Past Surgical History  Procedure Laterality Date  . Cholecystectomy     Family History: family history includes Cancer in her paternal grandmother. Social History:  reports that she has quit smoking. She has never used smokeless tobacco. She reports that she does not drink alcohol or use illicit drugs.  Medical, Surgical, Family and Social histories reviewed and are listed above.  Medications and allergies reviewed.   Review of Systems  Constitutional: Positive for malaise/fatigue. Negative for fever and chills.  HENT: Negative for sore throat.   Eyes: Negative for blurred vision.  Respiratory: Positive for shortness of breath. Negative for cough, hemoptysis, sputum production and wheezing.   Cardiovascular: Negative for chest pain and leg swelling.  Gastrointestinal: Negative for nausea, vomiting and abdominal pain.  Genitourinary: Negative for dysuria.  Musculoskeletal: Negative for myalgias.  Neurological: Positive for dizziness. Negative for focal weakness, seizures, loss of consciousness, weakness and headaches.  All other systems reviewed and are negative.     Blood pressure 102/63, pulse 112, temperature 98.6 F (37 C), temperature source Oral, resp. rate 20, height 5' 6.5" (1.689 m), last menstrual period 02/13/2014, SpO2 97 %.   Fetal Exam Fetal Monitor Review: Baseline rate: 140.  Variability:  moderate (6-25 bpm).   Pattern: accelerations present and no decelerations.    Fetal State Assessment: Category I - tracings are normal.     Physical Exam  Constitutional: She is oriented to person, place, and time. She appears well-developed and well-nourished. No distress.  HENT:  Head: Normocephalic and atraumatic.  Mouth/Throat: Oropharynx is clear and moist.  Eyes: EOM are normal. Pupils are equal, round, and reactive to light. No scleral icterus.  Neck: Normal range of motion. Neck supple.   Cardiovascular: Regular rhythm, normal heart sounds and intact distal pulses.  Exam reveals no gallop and no friction rub.   No murmur heard. Tachycardic  Respiratory: Effort normal and breath sounds normal. No respiratory distress. She has no wheezes. She has no rales.  Mild TTP over anterior chest wall, but does not reproduce chest pain  GI: Bowel sounds are normal. She exhibits no distension. There is no tenderness. There is no rebound and no guarding.  gravid  Musculoskeletal: Normal range of motion. She exhibits no edema or tenderness.  Neurological: She is alert and oriented to person, place, and time. No cranial nerve deficit.  Skin: Skin is warm and dry. No rash noted.  Psychiatric: Her behavior is normal. Thought content normal.  Somewhat anxious    Prenatal labs: ABO, Rh: --/--/O POS (12/03 2010) Antibody: Negative (05/17 0902) Rubella: 1.39 (12/29 1128) RPR: Non Reactive (05/17 0902)  HBsAg: NEGATIVE (12/29 1128)  HIV: NONREACTIVE (12/03 2003)  GBS:      Troponin 0.15 EKG: Sinus tachycardia, non-ischemic CTA Chest: IMPRESSION: Normal CTA of the chest. No acute findings.  Assessment/Plan: SOB and sinus tachycardia in 3rd trimester with hypoxemia to 88% with exertion Viable IUP at [redacted]w[redacted]d Elevated troponin - suspect demand ischemia, but will cycle trops    Place in observation in AICU  Ambulating O2 in AM Pulm consult in AM Cycle troponins Repeat EKG in AM Monitor on telemetry, continuous pulse ox, continuous fetal monitoring and tocometry Percocet prn pain  SCDs for DVT ppx NPO p MN pending troponins mIVF LR @ 161mL/hr Continue home aspirin 81mg  daily Check UA/UDS   Virginia Crews, MD, MPH PGY-2,  [facility] 11/04/2014 10:33 PM   Seen and examined by me also Agree with note I amended the ROS and reviewed the medical, surgical, family and social histories See ROS for my ROS documentation  Appears comfortable, only gets SOB when  moving around O2 sats 97-99% now HR RRR Fetal heart rate reactive Rare contractions  Will let her eat dinner now then NPO until we see if Troponin goes down Plan observe overnight and probable Pulmonary consult tomorrow or near future Agree with note Seabron Spates, CNM

## 2014-11-04 NOTE — MAU Note (Signed)
Paged radiology again.  Waiting for callback.

## 2014-11-04 NOTE — Telephone Encounter (Signed)
Pt c/o contractions throughout the night, lower back pain, pain with urination, vomiting, states not feeling baby move as much as normal. Pt states has ate something this am but has not felt baby move any today. Pt informed to be here today at 1:45 pm for evaluation with Lou Cal.

## 2014-11-05 DIAGNOSIS — R0602 Shortness of breath: Secondary | ICD-10-CM | POA: Diagnosis not present

## 2014-11-05 DIAGNOSIS — O9989 Other specified diseases and conditions complicating pregnancy, childbirth and the puerperium: Secondary | ICD-10-CM

## 2014-11-05 LAB — CBC
HCT: 31.7 % — ABNORMAL LOW (ref 36.0–46.0)
Hemoglobin: 10.4 g/dL — ABNORMAL LOW (ref 12.0–15.0)
MCH: 28.7 pg (ref 26.0–34.0)
MCHC: 32.8 g/dL (ref 30.0–36.0)
MCV: 87.3 fL (ref 78.0–100.0)
Platelets: 432 10*3/uL — ABNORMAL HIGH (ref 150–400)
RBC: 3.63 MIL/uL — ABNORMAL LOW (ref 3.87–5.11)
RDW: 14.1 % (ref 11.5–15.5)
WBC: 12.5 10*3/uL — ABNORMAL HIGH (ref 4.0–10.5)

## 2014-11-05 LAB — TROPONIN I
Troponin I: 0.06 ng/mL — ABNORMAL HIGH (ref <0.031)
Troponin I: 0.13 ng/mL — ABNORMAL HIGH (ref <0.031)
Troponin I: <0.03 ng/mL (ref <0.031)

## 2014-11-05 LAB — MRSA PCR SCREENING: MRSA BY PCR: NEGATIVE

## 2014-11-05 MED ORDER — ALPRAZOLAM 0.5 MG PO TABS
0.5000 mg | ORAL_TABLET | Freq: Once | ORAL | Status: AC
  Administered 2014-11-05: 0.5 mg via ORAL
  Filled 2014-11-05: qty 1

## 2014-11-05 MED ORDER — DIPHENHYDRAMINE HCL 25 MG PO CAPS
25.0000 mg | ORAL_CAPSULE | Freq: Four times a day (QID) | ORAL | Status: DC | PRN
  Administered 2014-11-05: 25 mg via ORAL
  Filled 2014-11-05: qty 1

## 2014-11-05 MED ORDER — PRENATAL MULTIVITAMIN CH: 1.0000 | ORAL_TABLET | Freq: Every day | ORAL | Status: DC

## 2014-11-05 NOTE — Progress Notes (Signed)
Patient ID: Dana N Cody, female   DOB: Oct 22, 1990, 24 y.o.   MRN: [id number] [place]) NOTE  Dana Mcpherson is a 24 y.o. G3P1011 at [redacted]w[redacted]d by best clinical estimate who is admitted for shortness of breath.   Fetal presentation is unsure. Length of Stay:    Days  Subjective: Feels well, but has been resting.  Denies CP. Reports not sleeping well. Snores sometimes. Daytime sleepiness noted. Patient reports the fetal movement as active. Patient reports uterine contraction  activity as none. Patient reports  vaginal bleeding as none. Patient describes fluid per vagina as None.  Vitals:  Blood pressure 114/68, pulse 99, temperature 98.6 F (37 C), temperature source Oral, resp. rate 12, height 5' 6.5" (1.689 m), weight 231 lb 11.2 oz (105.098 kg), last menstrual period 02/13/2014, SpO2 98 %. Physical Examination:  General appearance - alert, well appearing, and in no distress and overweight Chest - normal respiratory effort Abdomen - gravid, non-tender Fundal Height:  size equals dates Extremities: extremities normal, atraumatic, no cyanosis or edema  Membranes:intact  Fetal Monitoring:  Baseline: 135 bpm, Variability: Good {> 6 bpm), Accelerations: Reactive and Decelerations: Absent  Labs:  Results for orders placed or performed during the hospital encounter of 11/04/14 (from the past 24 hour(s))  CBC   Collection Time: 11/04/14  5:15 PM  Result Value Ref Range   WBC 18.2 (H) 4.0 - 10.5 K/uL   RBC 4.03 3.87 - 5.11 MIL/uL   Hemoglobin 11.7 (L) 12.0 - 15.0 g/dL   HCT 34.8 (L) 36.0 - 46.0 %   MCV 86.4 78.0 - 100.0 fL   MCH 29.0 26.0 - 34.0 pg   MCHC 33.6 30.0 - 36.0 g/dL   RDW 14.0 11.5 - 15.5 %   Platelets 480 (H) 150 - 400 K/uL  Basic metabolic panel   Collection Time: 11/04/14  5:15 PM  Result Value Ref Range   Sodium 133 (L) 135 - 145 mmol/L   Potassium 4.1 3.5 - 5.1 mmol/L   Chloride 104 101 - 111 mmol/L   CO2 18 (L) 22 - 32 mmol/L   Glucose, Bld 86 65 - 99 mg/dL   BUN 6 6 - 20 mg/dL   Creatinine, Ser 0.50 0.44 - 1.00 mg/dL   Calcium 9.0 8.9 - 10.3 mg/dL   GFR calc non Af Amer >60 >60 mL/min   GFR calc Af Amer >60 >60 mL/min   Anion gap 11 5 - 15  Troponin I   Collection Time: 11/04/14  5:15 PM  Result Value Ref Range   Troponin I 0.15 (H) <0.031 ng/mL  Urinalysis, Routine w reflex microscopic (not at [facility])   Collection Time: 11/04/14 10:15 PM  Result Value Ref Range   Color, Urine YELLOW YELLOW   APPearance CLEAR CLEAR   Specific Gravity, Urine <1.005 (L) 1.005 - 1.030   pH 7.0 5.0 - 8.0   Glucose, UA NEGATIVE NEGATIVE mg/dL   Hgb urine dipstick NEGATIVE NEGATIVE   Bilirubin Urine NEGATIVE NEGATIVE   Ketones, ur 40 (A) NEGATIVE mg/dL   Protein, ur NEGATIVE NEGATIVE mg/dL   Urobilinogen, UA 0.2 0.0 - 1.0 mg/dL   Nitrite NEGATIVE NEGATIVE   Leukocytes, UA NEGATIVE NEGATIVE  Urine rapid drug screen (hosp performed)not at [facility]   Collection Time: 11/04/14 10:15 PM  Result Value Ref Range   Opiates NONE DETECTED NONE DETECTED   Cocaine NONE DETECTED NONE DETECTED   Benzodiazepines NONE DETECTED NONE DETECTED   Amphetamines NONE DETECTED NONE DETECTED  Tetrahydrocannabinol NONE DETECTED NONE DETECTED   Barbiturates NONE DETECTED NONE DETECTED  MRSA PCR Screening   Collection Time: 11/04/14 11:00 PM  Result Value Ref Range   MRSA by PCR NEGATIVE NEGATIVE  Troponin I (q 6hr x 3)   Collection Time: 11/04/14 11:23 PM  Result Value Ref Range   Troponin I 0.06 (H) <0.031 ng/mL  CBC   Collection Time: 11/05/14  5:15 AM  Result Value Ref Range   WBC 12.5 (H) 4.0 - 10.5 K/uL   RBC 3.63 (L) 3.87 - 5.11 MIL/uL   Hemoglobin 10.4 (L) 12.0 - 15.0 g/dL   HCT 31.7 (L) 36.0 - 46.0 %   MCV 87.3 78.0 - 100.0 fL   MCH 28.7 26.0 - 34.0 pg   MCHC 32.8 30.0 - 36.0 g/dL   RDW 14.1 11.5 - 15.5 %   Platelets 432 (H) 150 - 400 K/uL  Troponin I (q 6hr x 3)   Collection Time: 11/05/14  5:15 AM  Result Value Ref Range    Troponin I 0.13 (H) <0.031 ng/mL   Results for Laws, Dana N (MRN [id number]) as of 11/05/2014 07:56  Ref. Range 11/04/2014 17:15 11/04/2014 23:23 11/05/2014 05:15  Troponin I Latest Ref Range: <0.031 ng/mL 0.15 (H) 0.06 (H) 0.13 (H)   Medications:  Scheduled . aspirin EC  81 mg Oral Daily  . docusate sodium  100 mg Oral Daily  . pantoprazole  40 mg Oral Daily  . prenatal multivitamin  1 tablet Oral Q1200   I have reviewed the patient's current medications.  ASSESSMENT: Patient Active Problem List   Diagnosis Date Noted  . Shortness of breath 11/04/2014  . Tachycardia 10/07/2014  . ASCUS with positive high risk HPV 05/28/2014  . Supervision of normal pregnancy 04/22/2014  . Hx of preeclampsia, prior pregnancy, currently pregnant 04/22/2014  . PCOS (polycystic ovarian syndrome) 03/03/2014  . Hyperinsulinemia 03/03/2014  . Morbid obesity 05/23/2013  . DUB (dysfunctional uterine bleeding) 05/23/2013    PLAN: Still unclear etiology. Troponin pattern is odd.  Will repeat one more time. Walking O2 today.  If O2 goes down will touch base with pulmonary to decide if outpatient vs. Inpatient consultation is best. Consider sleep study.  Donnamae Jude, MD 11/05/2014,7:53 AM

## 2014-11-05 NOTE — Progress Notes (Signed)
1021 - Patient took 1000 meds.  Stated that her chest was "hurting" 6 of 10.  Reports it as a sharp pain and that the pain had improved last night when her heart rate went down.  States that it is "hurting now that her heart rate is back up."  Heart rate 110-125.  Dr. Ihor Dow notified.  Gave order for patient to receive one time dose of Xanax 0.5 po.  Next Troponin at 1100.

## 2014-11-05 NOTE — Discharge Summary (Signed)
Obstetric Discharge Summary Reason for Admission: SOB, Tachycardia   Patient is 24 y.o. [id number] [redacted]w[redacted]d here with complaints of persistent SOB, tachycardia with extensive outpatient workup by cardiology that ruled out cardiac causes. She was sent to the hospital for destats in clinic to low 90s. The clinic concern was for PE. She had an EKG that was sinus tachycardia. Lab work up s/f for elevated troponin to 0.15.  CT-angiogram was negative for pulmonary embolism. Her troponin was trended and was normal on discharge. She will be referred for outpatient pulmonary follow up to assess for pulmonary dysfunction as the cause of her tachycardia and SOB. She has been effectively ruled out for cardiac etiology and VTE.   +FM, denies LOF, VB, contractions, vaginal discharge.  Prenatal Procedures: NST HEMOGLOBIN  Date Value Ref Range Status  11/05/2014 10.4* 12.0 - 15.0 g/dL Final   HCT  Date Value Ref Range Status  11/05/2014 31.7* 36.0 - 46.0 % Final   HEMATOCRIT  Date Value Ref Range Status  09/09/2014 35.3 34.0 - 46.6 % Final   Physical Exam:  General: alert and no distress DVT Evaluation: No evidence of DVT seen on physical exam. Negative Homan's sign. No cords or calf tenderness.  Discharge Diagnoses: Shortness of breath, Elevated troponin  Discharge Information: Date: 11/05/2014 Activity: unrestricted Diet: routine Medications: PNV Condition: stable Discharge to: home  Caren Macadam, MD 11/05/2014, 2:28 PM

## 2014-11-05 NOTE — Progress Notes (Deleted)
Upon admission to AICU at 2247, pt's telemetry showed T wave inversion. At 0140 T wave reverted back to normal.  At 0200, pt was laying on left side. When laying supine, T wave inversion reappeared.

## 2014-11-05 NOTE — Progress Notes (Addendum)
0755 - patient's O2 sats at rest on RA 100%.  Patient ambulated in hallway around unit with O2 sats on RA 99-100% with heart rate in 110-120.  Patient stopped one time because she "felt light-headed", but then continued to ambulate w/o complaint.  Dr. Kennon Rounds on unit and aware.

## 2014-11-05 NOTE — Progress Notes (Signed)
Patient reports pain decreased to 4 of 10.  States feels some shortness of breath after she has been up to bathroom and has been talking to family.  O2 sats 98% on RA.  Heart rate 105-110.  Family at bedside.

## 2014-11-05 NOTE — Discharge Instructions (Signed)
You were evaluated for shortness of breath.

## 2014-11-05 NOTE — Progress Notes (Signed)
1255 - Patient reports pain now down to a 3 from a 6.  Currently eating lunch.

## 2014-11-05 NOTE — Progress Notes (Signed)
AVS reviewed with patient.  Patient verbalized understanding of discharge instructions, physician follow-up, and medications.  Verbalized understanding that pulmonology referral had been made and that the physicians' office would be contacting her with the appointment time and date.  Patient was provided with the office number for [facility].  Patient's IV removed.  Site WNL.  Patient reports no pain.  States only increased pain with lots of moving around.  Patient transported to main entrance for discharge.  Reports all belongings intact and in possession at time of discharge.  Patient stable at time of discharge.

## 2014-11-06 ENCOUNTER — Telehealth: Payer: Self-pay | Admitting: *Deleted

## 2014-11-06 ENCOUNTER — Encounter (HOSPITAL_COMMUNITY): Payer: Self-pay | Admitting: *Deleted

## 2014-11-06 ENCOUNTER — Inpatient Hospital Stay (HOSPITAL_COMMUNITY)
Admission: AD | Admit: 2014-11-06 | Discharge: 2014-11-06 | Disposition: A | Discharge status: Home / Self Care | Payer: 59 | Source: Ambulatory Visit | Attending: Obstetrics & Gynecology | Admitting: Obstetrics & Gynecology

## 2014-11-06 DIAGNOSIS — Z3493 Encounter for supervision of normal pregnancy, unspecified, third trimester: Secondary | ICD-10-CM

## 2014-11-06 DIAGNOSIS — Z87891 Personal history of nicotine dependence: Secondary | ICD-10-CM | POA: Diagnosis not present

## 2014-11-06 DIAGNOSIS — R0789 Other chest pain: Secondary | ICD-10-CM

## 2014-11-06 DIAGNOSIS — R000 Tachycardia, unspecified: Secondary | ICD-10-CM | POA: Diagnosis not present

## 2014-11-06 DIAGNOSIS — R0602 Shortness of breath: Secondary | ICD-10-CM | POA: Diagnosis present

## 2014-11-06 HISTORY — DX: Tachycardia, unspecified: R00.0

## 2014-11-06 LAB — TROPONIN I: TROPONIN I: 0.06 ng/mL — AB (ref <0.031)

## 2014-11-06 MED ORDER — LABETALOL HCL 100 MG PO TABS: 100.0000 mg | ORAL_TABLET | Freq: Two times a day (BID) | ORAL | Status: DC

## 2014-11-06 NOTE — Progress Notes (Signed)
Post discharge chart review completed.  

## 2014-11-06 NOTE — Telephone Encounter (Signed)
Pt states discharge from [facility] yesterday after being admitted for shortness of breath and chest pain. Pt states she is now having shortness of breath. Pt advised to go to MAU. Pt verbalized understanding.

## 2014-11-06 NOTE — Discharge Instructions (Signed)
Nonspecific Tachycardia  Tachycardia is a faster than normal heartbeat (more than 100 beats per minute). In adults, the heart normally beats between 60 and 100 times a minute. A fast heartbeat may be a normal response to exercise or stress. It does not necessarily mean that something is wrong. However, sometimes when your heart beats too fast it may not be able to pump enough blood to the rest of your body. This can result in chest pain, shortness of breath, dizziness, and even fainting. Nonspecific tachycardia means that the specific cause or pattern of your tachycardia is unknown.  CAUSES   Tachycardia may be harmless or it may be due to a more serious underlying cause. Possible causes of tachycardia include:  · Exercise or exertion.  · Fever.  · Pain or injury.  · Infection.  · Loss of body fluids (dehydration).  · Overactive thyroid.  · Lack of red blood cells (anemia).  · Anxiety and stress.  · Alcohol.  · Caffeine.  · Tobacco products.  · Diet pills.  · Illegal drugs.  · Heart disease.  SYMPTOMS  · Rapid or irregular heartbeat (palpitations).  · Suddenly feeling your heart beating (cardiac awareness).  · Dizziness.  · Tiredness (fatigue).  · Shortness of breath.  · Chest pain.  · Nausea.  · Fainting.  DIAGNOSIS   Your caregiver will perform a physical exam and take your medical history. In some cases, a heart specialist (cardiologist) may be consulted. Your caregiver may also order:  · Blood tests.  · Electrocardiography. This test records the electrical activity of your heart.  · A heart monitoring test.  TREATMENT   Treatment will depend on the likely cause of your tachycardia. The goal is to treat the underlying cause of your tachycardia. Treatment methods may include:  · Replacement of fluids or blood through an intravenous (IV) tube for moderate to severe dehydration or anemia.  · New medicines or changes in your current medicines.  · Diet and lifestyle changes.  · Treatment for certain  infections.  · Stress relief or relaxation methods.  HOME CARE INSTRUCTIONS   · Rest.  · Drink enough fluids to keep your urine clear or pale yellow.  · Do not smoke.  · Avoid:  ¨ Caffeine.  ¨ Tobacco.  ¨ Alcohol.  ¨ Chocolate.  ¨ Stimulants such as over-the-counter diet pills or pills that help you stay awake.  ¨ Situations that cause anxiety or stress.  ¨ Illegal drugs such as marijuana, phencyclidine (PCP), and cocaine.  · Only take medicine as directed by your caregiver.  · Keep all follow-up appointments as directed by your caregiver.  SEEK IMMEDIATE MEDICAL CARE IF:   · You have pain in your chest, upper arms, jaw, or neck.  · You become weak, dizzy, or feel faint.  · You have palpitations that will not go away.  · You vomit, have diarrhea, or pass blood in your stool.  · Your skin is cool, pale, and wet.  · You have a fever that will not go away with rest, fluids, and medicine.  MAKE SURE YOU:   · Understand these instructions.  · Will watch your condition.  · Will get help right away if you are not doing well or get worse.  Document Released: 05/19/2004 Document Revised: 07/04/2011 Document Reviewed: 03/22/2011  ExitCare® Patient Information ©2015 [employer]. This information is not intended to replace advice given to you by your health care provider. Make sure you discuss any questions   you have with your health care provider.

## 2014-11-06 NOTE — MAU Provider Note (Signed)
History     CSN: [id number]  Arrival date and time: 11/06/14 1331   None     Chief Complaint  Patient presents with  . Shortness of Breath   HPI Pt was discharged from the hospital yesterday after admission for SOB and chest pain.  Her cardiac workup has been negative.  She is s/p ECHO and CTA to r/o PE.   She also had cardiac enzymes done.  All of her studies were normal.  She reports that she continues to have sx including left sided chest pain and came in because she thought she could be admitted for the pulm consult.  She denies new sx since discharge.  She was given Xanax yesterday when she c/o chest pain but, she reports that it only made her feel dizzy.  Her step mother was in the room today and reports that pt is 'stressed' and asked if that could be the cause of her pain.  Pt asked about delivery early if her sx continued.       Past Medical History  Diagnosis Date  . Allergy   . Migraine headache   . Polycystic ovarian syndrome   . Pregnancy induced hypertension   . Vaginal Pap smear, abnormal   . Complication of anesthesia     woke up during surgery & ithing after surgery  . Anxiety   . Tachycardia     Past Surgical History  Procedure Laterality Date  . Cholecystectomy      Family History  Problem Relation Age of Onset  . Cancer Paternal Grandmother     ovarian cancer    History  Substance Use Topics  . Smoking status: Former Research scientist (life sciences)  . Smokeless tobacco: Never Used  . Alcohol Use: No     Comment: occasional    Allergies:  Allergies  Allergen Reactions  . Other Anaphylaxis    Axe and Tag body spray, lysol  . Shellfish Allergy Anaphylaxis and Hives  . Adhesive [Tape] Hives  . Eggs Or Egg-Derived Products Itching and Nausea Only  . Latex Itching    Prescriptions prior to admission  Medication Sig Dispense Refill Last Dose  . acetaminophen (TYLENOL) 325 MG tablet Take 650 mg by mouth every 6 (six) hours as needed for moderate pain.   Past Month at  Unknown time  . aspirin 81 MG tablet Take 81 mg by mouth daily.   11/03/2014 at Unknown time  . calcium carbonate (TUMS - DOSED IN MG ELEMENTAL CALCIUM) 500 MG chewable tablet Chew 1 tablet by mouth daily.   Past Week at Unknown time  . Doxylamine-Pyridoxine (DICLEGIS) 10-10 MG TBEC Take 2 tablets by mouth at bedtime. May take an additional tablet in the morning and an additional tablet in the afternoon prn nausea 120 tablet 1 11/03/2014 at Unknown time  . omeprazole (PRILOSEC) 20 MG capsule Take 1 capsule (20 mg total) by mouth 2 (two) times daily before a meal. 60 capsule 5 11/03/2014 at Unknown time  . Prenat-FeFum-FePo-FA-Omega 3 (CONCEPT DHA) 53.5-38-1 MG CAPS Take 1 capsule by mouth daily. 30 capsule 11 11/03/2014 at Unknown time    ROS Physical Exam   Last menstrual period 02/13/2014.  Physical ExamPt in NAD Resting comfortably.  O2 Sat 100% on RA Lungs: CTA CV: sinus tach Abd: NT, ND, gravid FHR Cat I   Peak flow meter resutls: >420 x2  ECG: sinus tachycardia    Troponin 0.06  MAU Course  Procedures ECG Tropinin I- repeated and not increased from prior  visits.      Assessment and Plan  SOB and Sinus tachycardia with negative cardiac workup Troponin stable Labetalol 100mg  bid  F/u for ROB at FT as prev scheduled or sooner prn  HARRAWAY-SMITH, Shaina Gullatt 11/06/2014, 2:27 PM

## 2014-11-06 NOTE — MAU Note (Addendum)
Pt says she was just discharge from hospital yesterday with a referral to a pulmonologist. Presents with c/o "not being able to breath". Says her chest hurts over her upper left side. Shortness of breath. Says pulmonologist has not called and she was told to come back to the hospital by [facility]. Has a headache and is seeing spots in her vision.

## 2014-11-13 ENCOUNTER — Ambulatory Visit (INDEPENDENT_AMBULATORY_CARE_PROVIDER_SITE_OTHER): Payer: 59 | Admitting: Advanced Practice Midwife

## 2014-11-13 VITALS — BP 110/68 | HR 104 | Wt 222.0 lb

## 2014-11-13 DIAGNOSIS — Z369 Encounter for antenatal screening, unspecified: Secondary | ICD-10-CM

## 2014-11-13 DIAGNOSIS — Z029 Encounter for administrative examinations, unspecified: Secondary | ICD-10-CM

## 2014-11-13 DIAGNOSIS — Z3685 Encounter for antenatal screening for Streptococcus B: Secondary | ICD-10-CM

## 2014-11-13 DIAGNOSIS — Z3493 Encounter for supervision of normal pregnancy, unspecified, third trimester: Secondary | ICD-10-CM

## 2014-11-13 LAB — POCT URINALYSIS DIPSTICK
GLUCOSE UA: NEGATIVE
Ketones, UA: NEGATIVE
Leukocytes, UA: NEGATIVE
Nitrite, UA: NEGATIVE
Protein, UA: NEGATIVE
RBC UA: NEGATIVE

## 2014-11-13 LAB — OB RESULTS CONSOLE GBS: GBS: NEGATIVE

## 2014-11-13 NOTE — Patient Instructions (Signed)
PUPS: (Pruritic Urticarial Papules and Plaques of Pregnancy)

## 2014-11-13 NOTE — Progress Notes (Signed)
[id number] [redacted]w[redacted]d Estimated Date of Delivery: 12/06/14  Blood pressure 110/68, pulse 104, weight 222 lb (100.699 kg), last menstrual period 02/13/2014.   BP weight and urine results all reviewed and noted.  Please refer to the obstetrical flow sheet for the fundal height and fetal heart rate documentation:  Patient reports good fetal movement, denies any bleeding and no rupture of membranes symptoms or regular contractions.  She was hosptialized earlier in the month for SOB/tachycardia.  Work up was "completley negative" for cardiac origin/PE.  She was d/c'd home on Labetalol 100mg  BID for tachycardia, but she feels like it drops her pressures too low.  Doesn't want to start another beta blocker yet d/t potential (albeit limited) growth issues.    All questions were answered.  Plan:  Continued routine obstetrical care, will try Labetalol 50mg  BID. GBS today  Follow up in 1 weeks for OB appointment,

## 2014-11-14 LAB — GC/CHLAMYDIA PROBE AMP
Chlamydia trachomatis, NAA: NEGATIVE
Neisseria gonorrhoeae by PCR: NEGATIVE

## 2014-11-18 ENCOUNTER — Ambulatory Visit (INDEPENDENT_AMBULATORY_CARE_PROVIDER_SITE_OTHER): Payer: 59 | Admitting: Obstetrics & Gynecology

## 2014-11-18 ENCOUNTER — Encounter: Payer: Self-pay | Admitting: Obstetrics & Gynecology

## 2014-11-18 VITALS — BP 108/68 | HR 84 | Wt 222.0 lb

## 2014-11-18 DIAGNOSIS — Z3493 Encounter for supervision of normal pregnancy, unspecified, third trimester: Secondary | ICD-10-CM

## 2014-11-18 DIAGNOSIS — Z331 Pregnant state, incidental: Secondary | ICD-10-CM

## 2014-11-18 DIAGNOSIS — Z1389 Encounter for screening for other disorder: Secondary | ICD-10-CM

## 2014-11-18 LAB — POCT URINALYSIS DIPSTICK
Blood, UA: NEGATIVE
Glucose, UA: NEGATIVE
Ketones, UA: NEGATIVE
NITRITE UA: NEGATIVE
PROTEIN UA: NEGATIVE

## 2014-11-18 LAB — CULTURE, BETA STREP (GROUP B ONLY): Strep Gp B Culture: NEGATIVE

## 2014-11-18 NOTE — Progress Notes (Signed)
Work in [employer] she is till getting really dizzy light headed from the labetalol even at 50 BID HR is being managed better but feels weak  Will drop to 50 qhs and see if that balances her HR and her untoward symptoms  cx ltc firm     Face to face time:  10 minutes  Greater than 50% of the visit time was spent in counseling and coordination of care with the patient.  The summary and outline of the counseling and care coordination is summarized in the note above.   All questions were answered.

## 2014-11-20 ENCOUNTER — Encounter: Payer: Self-pay | Admitting: Obstetrics & Gynecology

## 2014-11-20 ENCOUNTER — Ambulatory Visit (INDEPENDENT_AMBULATORY_CARE_PROVIDER_SITE_OTHER): Payer: 59 | Admitting: Obstetrics & Gynecology

## 2014-11-20 VITALS — BP 100/60 | HR 88 | Wt 222.0 lb

## 2014-11-20 DIAGNOSIS — Z1389 Encounter for screening for other disorder: Secondary | ICD-10-CM

## 2014-11-20 DIAGNOSIS — Z331 Pregnant state, incidental: Secondary | ICD-10-CM

## 2014-11-20 DIAGNOSIS — Z3493 Encounter for supervision of normal pregnancy, unspecified, third trimester: Secondary | ICD-10-CM

## 2014-11-20 LAB — POCT URINALYSIS DIPSTICK
GLUCOSE UA: NEGATIVE
KETONES UA: NEGATIVE
Leukocytes, UA: NEGATIVE
Nitrite, UA: NEGATIVE
RBC UA: NEGATIVE

## 2014-11-20 NOTE — Progress Notes (Signed)
[id number] [redacted]w[redacted]d Estimated Date of Delivery: 12/06/14  Blood pressure 100/60, pulse 88, weight 222 lb (100.699 kg), last menstrual period 02/13/2014.   BP weight and urine results all reviewed and noted.  Please refer to the obstetrical flow sheet for the fundal height and fetal heart rate documentation:  Patient reports good fetal movement, denies any bleeding and no rupture of membranes symptoms or regular contractions. Patient is without complaints. All questions were answered.  Plan:  Continued routine obstetrical care,   Follow up in 1 weeks for OB appointment,   Feel better on 50 mg labetalol qhs

## 2014-11-26 ENCOUNTER — Encounter: Payer: Self-pay | Admitting: Women's Health

## 2014-11-26 ENCOUNTER — Ambulatory Visit (INDEPENDENT_AMBULATORY_CARE_PROVIDER_SITE_OTHER): Payer: 59 | Admitting: Women's Health

## 2014-11-26 VITALS — BP 118/64 | HR 84 | Wt 227.0 lb

## 2014-11-26 DIAGNOSIS — Z3493 Encounter for supervision of normal pregnancy, unspecified, third trimester: Secondary | ICD-10-CM

## 2014-11-26 DIAGNOSIS — Z331 Pregnant state, incidental: Secondary | ICD-10-CM

## 2014-11-26 DIAGNOSIS — Z1389 Encounter for screening for other disorder: Secondary | ICD-10-CM

## 2014-11-26 LAB — POCT URINALYSIS DIPSTICK
GLUCOSE UA: NEGATIVE
KETONES UA: NEGATIVE
NITRITE UA: NEGATIVE
Protein, UA: NEGATIVE
RBC UA: NEGATIVE

## 2014-11-26 NOTE — Patient Instructions (Signed)
Call the office [phone]) or go to [facility] if:  You begin to have strong, frequent contractions  Your water breaks.  Sometimes it is a big gush of fluid, sometimes it is just a trickle that keeps getting your panties wet or running down your legs  You have vaginal bleeding.  It is normal to have a small amount of spotting if your cervix was checked.   You don't feel your baby moving like normal.  If you don't, get you something to eat and drink and lay down and focus on feeling your baby move.  You should feel at least 10 movements in 2 hours.  If you don't, you should call the office or go to [place] Contractions Contractions of the uterus can occur throughout pregnancy. Contractions are not always a sign that you are in labor.  WHAT ARE BRAXTON HICKS CONTRACTIONS?  Contractions that occur before labor are called Braxton Hicks contractions, or false labor. Toward the end of pregnancy (32-34 weeks), these contractions can develop more often and may become more forceful. This is not true labor because these contractions do not result in opening (dilatation) and thinning of the cervix. They are sometimes difficult to tell apart from true labor because these contractions can be forceful and people have different pain tolerances. You should not feel embarrassed if you go to the hospital with false labor. Sometimes, the only way to tell if you are in true labor is for your health care provider to look for changes in the cervix. If there are no prenatal problems or other health problems associated with the pregnancy, it is completely safe to be sent home with false labor and await the onset of true labor. HOW CAN YOU TELL THE DIFFERENCE BETWEEN TRUE AND FALSE LABOR? False Labor  The contractions of false labor are usually shorter and not as hard as those of true labor.   The contractions are usually irregular.   The contractions are often felt in the front of  the lower abdomen and in the groin.   The contractions may go away when you walk around or change positions while lying down.   The contractions get weaker and are shorter lasting as time goes on.   The contractions do not usually become progressively stronger, regular, and closer together as with true labor.  True Labor  Contractions in true labor last 30-70 seconds, become very regular, usually become more intense, and increase in frequency.   The contractions do not go away with walking.   The discomfort is usually felt in the top of the uterus and spreads to the lower abdomen and low back.   True labor can be determined by your health care provider with an exam. This will show that the cervix is dilating and getting thinner.  WHAT TO REMEMBER  Keep up with your usual exercises and follow other instructions given by your health care provider.   Take medicines as directed by your health care provider.   Keep your regular prenatal appointments.   Eat and drink lightly if you think you are going into labor.   If Braxton Hicks contractions are making you uncomfortable:   Change your position from lying down or resting to walking, or from walking to resting.   Sit and rest in a tub of warm water.   Drink 2-3 glasses of water. Dehydration may cause these contractions.   Do slow and deep breathing several times an hour.  WHEN SHOULD I SEEK IMMEDIATE MEDICAL CARE? Seek immediate medical care if:  Your contractions become stronger, more regular, and closer together.   You have fluid leaking or gushing from your vagina.   You have a fever.   You pass blood-tinged mucus.   You have vaginal bleeding.   You have continuous abdominal pain.   You have low back pain that you never had before.   You feel your baby's head pushing down and causing pelvic pressure.   Your baby is not moving as much as it used to.  Document Released: 04/11/2005 Document  Revised: 04/16/2013 Document Reviewed: 01/21/2013 [facility] Patient Information 2015 [place], [place]. This information is not intended to replace advice given to you by your health care provider. Make sure you discuss any questions you have with your health care provider.

## 2014-11-26 NOTE — Progress Notes (Signed)
Low-risk OB appointment [id number] [redacted]w[redacted]d Estimated Date of Delivery: 12/06/14 BP 118/64 mmHg  Pulse 84  Wt 227 lb (102.967 kg)  LMP 02/13/2014 (Exact Date)  BP, weight, and urine reviewed.  Refer to obstetrical flow sheet for FH & FHR.  Reports good fm.  Denies regular uc's, lof, vb, or uti s/s. Nausea, some vomiting, taking prilosec. Some cramping. Had gush of fluid yesterday after voiding, no further leakage.  SSE: cx visually closed, small amount creamy white nonodorous d/c, no pooling- no change w/ valsalva, neg fern & nitrazine SVE per request: 1/th/-2, vtx Reviewed labor s/s, fkc, gbs-. Plan:  Continue routine obstetrical care  F/U in 1wk for OB appointment, is interested in membrane stripping if favorable

## 2014-12-01 ENCOUNTER — Telehealth: Payer: Self-pay | Admitting: *Deleted

## 2014-12-01 ENCOUNTER — Inpatient Hospital Stay (HOSPITAL_COMMUNITY)
Admission: AD | Admit: 2014-12-01 | Discharge: 2014-12-01 | Disposition: A | Discharge status: Home / Self Care | Payer: 59 | Source: Ambulatory Visit | Attending: Family Medicine | Admitting: Family Medicine

## 2014-12-01 ENCOUNTER — Encounter (HOSPITAL_COMMUNITY): Payer: Self-pay | Admitting: *Deleted

## 2014-12-01 DIAGNOSIS — O479 False labor, unspecified: Secondary | ICD-10-CM

## 2014-12-01 DIAGNOSIS — Z87891 Personal history of nicotine dependence: Secondary | ICD-10-CM | POA: Insufficient documentation

## 2014-12-01 DIAGNOSIS — R51 Headache: Secondary | ICD-10-CM | POA: Diagnosis not present

## 2014-12-01 DIAGNOSIS — O471 False labor at or after 37 completed weeks of gestation: Secondary | ICD-10-CM

## 2014-12-01 DIAGNOSIS — R350 Frequency of micturition: Secondary | ICD-10-CM | POA: Insufficient documentation

## 2014-12-01 DIAGNOSIS — O9989 Other specified diseases and conditions complicating pregnancy, childbirth and the puerperium: Secondary | ICD-10-CM

## 2014-12-01 DIAGNOSIS — Z3A39 39 weeks gestation of pregnancy: Secondary | ICD-10-CM | POA: Diagnosis not present

## 2014-12-01 DIAGNOSIS — G44209 Tension-type headache, unspecified, not intractable: Secondary | ICD-10-CM | POA: Diagnosis not present

## 2014-12-01 LAB — URINALYSIS, ROUTINE W REFLEX MICROSCOPIC
Bilirubin Urine: NEGATIVE
Glucose, UA: NEGATIVE mg/dL
Hgb urine dipstick: NEGATIVE
Ketones, ur: NEGATIVE mg/dL
Nitrite: NEGATIVE
PROTEIN: NEGATIVE mg/dL
SPECIFIC GRAVITY, URINE: 1.015 (ref 1.005–1.030)
Urobilinogen, UA: 0.2 mg/dL (ref 0.0–1.0)
pH: 6.5 (ref 5.0–8.0)

## 2014-12-01 LAB — URINE MICROSCOPIC-ADD ON

## 2014-12-01 MED ORDER — ACETAMINOPHEN 500 MG PO TABS: 1000.0000 mg | ORAL_TABLET | Freq: Once | ORAL | Status: DC

## 2014-12-01 NOTE — Telephone Encounter (Signed)
Pt states contractions every 5-6 minutes apart, +FM, no vaginal bleeding, no gush, c/o headaches. Pt advised to go to [facility] for evaluation. Pt verbalized understanding.

## 2014-12-01 NOTE — Discharge Instructions (Signed)
Call the clinic or go to [facility] if:  You begin to have strong, frequent contractions  Your water breaks.  Sometimes it is a big gush of fluid, sometimes it is just a trickle that keeps getting your panties wet or running down your legs  You have vaginal bleeding.  It is normal to have a small amount of spotting if your cervix was checked.   You don't feel your baby moving like normal.  If you don't, get you something to eat and drink and lay down and focus on feeling your baby move.  You should feel at least 10 movements in 2 hours.  If you don't, you should call the office or go to [place] T[facility]enol for headache.

## 2014-12-01 NOTE — MAU Note (Signed)
Patient presents at [redacted] weeks gestation with c/o contractions since 0200 followed by a headache and urinary frequency. Fetus active. Denies bleeding or abnormal discharge.

## 2014-12-01 NOTE — MAU Provider Note (Signed)
History     CSN: [id number]  Arrival date and time: 12/01/14 7096   First Provider Initiated Contact with Patient 12/01/14 1128      Chief Complaint  Patient presents with  . Contractions  . Headache  . Urinary Frequency   HPI Patient is 24 y.o. [id number] [redacted]w[redacted]d here with complaints of contractions, headache and urinary frequency.  Contractions/Abdominal Pain: Began at 2 am this morning. Ctx were originally q5-6 minutes but have since spaced out in frequency.   Headache: Began this morning. "Mild" pain. Has not tried any OTC medications.   Urinary frequency: Associated with each contraction. States that she had to urinate immediately after each contraction. Denies dysuria or urgency.   +FM, denies LOF, VB, vaginal discharge.   OB History    Gravida Para Term Preterm AB TAB SAB Ectopic Multiple Living   3 1 1  1  1   1       Obstetric Comments   IOL for pre-e      Past Medical History  Diagnosis Date  . Allergy   . Migraine headache   . Polycystic ovarian syndrome   . Pregnancy induced hypertension   . Vaginal Pap smear, abnormal   . Complication of anesthesia     woke up during surgery & ithing after surgery  . Anxiety   . Tachycardia     Past Surgical History  Procedure Laterality Date  . Cholecystectomy      Family History  Problem Relation Age of Onset  . Cancer Paternal Grandmother     ovarian cancer    History  Substance Use Topics  . Smoking status: Former Research scientist (life sciences)  . Smokeless tobacco: Never Used  . Alcohol Use: No     Comment: occasional    Allergies:  Allergies  Allergen Reactions  . Other Anaphylaxis    Axe and Tag body spray, lysol  . Shellfish Allergy Anaphylaxis and Hives  . Adhesive [Tape] Hives  . Eggs Or Egg-Derived Products Itching and Nausea Only  . Latex Itching    Prescriptions prior to admission  Medication Sig Dispense Refill Last Dose  . acetaminophen (TYLENOL) 325 MG tablet Take 650 mg by mouth every 6 (six) hours as  needed for moderate pain.   Not Taking  . aspirin 81 MG tablet Take 81 mg by mouth daily.   Taking  . calcium carbonate (TUMS - DOSED IN MG ELEMENTAL CALCIUM) 500 MG chewable tablet Chew 1 tablet by mouth daily.   Taking  . Docusate Calcium (STOOL SOFTENER PO) Take by mouth.   Not Taking  . Doxylamine-Pyridoxine (DICLEGIS) 10-10 MG TBEC Take 2 tablets by mouth at bedtime. May take an additional tablet in the morning and an additional tablet in the afternoon prn nausea (Patient not taking: Reported on 11/13/2014) 120 tablet 1 Not Taking  . EVENING PRIMROSE OIL PO Take by mouth.   Taking  . labetalol (NORMODYNE) 100 MG tablet Take 1 tablet (100 mg total) by mouth 2 (two) times daily. (Patient taking differently: Take 50 mg by mouth once. ) 60 tablet 1 Taking  . omeprazole (PRILOSEC) 20 MG capsule Take 1 capsule (20 mg total) by mouth 2 (two) times daily before a meal. 60 capsule 5 Taking  . Prenat-FeFum-FePo-FA-Omega 3 (CONCEPT DHA) 53.5-38-1 MG CAPS Take 1 capsule by mouth daily. 30 capsule 11 Taking    Review of Systems  Constitutional: Negative for fever, chills and malaise/fatigue.  HENT: Negative for congestion.   Eyes: Negative for blurred vision  and double vision.  Respiratory: Negative for cough and shortness of breath.   Cardiovascular: Negative for chest pain, palpitations, claudication and leg swelling.  Gastrointestinal: Positive for abdominal pain. Negative for heartburn, nausea, vomiting, diarrhea and constipation.  Genitourinary: Positive for frequency. Negative for dysuria, urgency, hematuria and flank pain.  Musculoskeletal: Negative for myalgias and back pain.  Skin: Negative for itching and rash.  Neurological: Positive for headaches. Negative for dizziness and loss of consciousness.   Physical Exam   Blood pressure 119/74, pulse 104, temperature 97.6 F (36.4 C), temperature source Oral, resp. rate 18, height 5' 6.5" (1.689 m), weight 102.967 kg (227 lb), last menstrual  period 02/13/2014.  Physical Exam  Constitutional: She is oriented to person, place, and time. She appears well-developed and well-nourished. No distress.  HENT:  Head: Normocephalic and atraumatic.  Eyes: Conjunctivae and EOM are normal.  Neck: Normal range of motion. No thyromegaly present.  Cardiovascular: Regular rhythm and normal heart sounds.  Exam reveals no gallop and no friction rub.   No murmur heard. Mildly tachy rate  Respiratory: Breath sounds normal. No respiratory distress. She has no wheezes. She has no rales.  GI: Soft. Bowel sounds are normal. She exhibits no distension. There is no tenderness.  Musculoskeletal: Normal range of motion. She exhibits edema.  Minimal pedal edema  Neurological: She is alert and oriented to person, place, and time.  Skin: Skin is warm and dry. No rash noted. No erythema.  Psychiatric: She has a normal mood and affect. Her behavior is normal.  Dilation: 1 Effacement (%): Thick Cervical Position: Posterior Station:  (high) Exam by:: Feliberto Harts, RN BSN   FHR: baseline 145, good variability, + accels, no decels Toco: 1 UC noted   MAU Course  Procedures  MDM UA collected. Urine cx sent. FHR monitoring.   Assessment and Plan  Patient is 24 y.o. G3P1011 [redacted]w[redacted]d reporting contractions, headache and urinary frequency. - fetal kick counts reinforced - labor precautions covered  -urinary frequency most likely related to ctx and pelvic pressure; due to presence of few bacteria and few leukocytes, will send urine for cx but do not believe patient needs tx at present -due to lack of cervical change and lack of contractions on toco, patient not in labor  -will take tylenol at home for headache -stable for discharge home   Melina Schools 12/01/2014, 11:28 AM   OB fellow attestation: I have seen and examined this patient; I agree with above documentation in the resident's note.   [place] N Alemu is a 24 y.o. G3P1011 reporting  contractions and HA +FM, denies LOF, VB, contractions, vaginal discharge.  PE: BP 115/64 mmHg  Pulse 97  Temp(Src) 97.6 F (36.4 C) (Oral)  Resp 18  Ht 5' 6.5" (1.689 m)  Wt 227 lb (102.967 kg)  BMI 36.09 kg/m2  LMP 02/13/2014 (Exact Date) Gen: calm comfortable, NAD Resp: normal effort, no distress Abd: gravid  ROS, labs, PMH reviewed NST reactive, Cat I  Plan: - fetal kick counts reinforced,  labor precautions - HA- BP is wnl thus not worried about PreX, likely tension style. Recommended hydration and Tylenol Prn - continue routine follow up in OB clinic  Caren Macadam, MD 12:37 PM

## 2014-12-03 ENCOUNTER — Encounter: Payer: Self-pay | Admitting: Women's Health

## 2014-12-03 ENCOUNTER — Ambulatory Visit (INDEPENDENT_AMBULATORY_CARE_PROVIDER_SITE_OTHER): Payer: 59 | Admitting: Women's Health

## 2014-12-03 VITALS — BP 136/66 | HR 100 | Wt 226.0 lb

## 2014-12-03 DIAGNOSIS — Z3493 Encounter for supervision of normal pregnancy, unspecified, third trimester: Secondary | ICD-10-CM

## 2014-12-03 DIAGNOSIS — Z1389 Encounter for screening for other disorder: Secondary | ICD-10-CM

## 2014-12-03 DIAGNOSIS — Z331 Pregnant state, incidental: Secondary | ICD-10-CM

## 2014-12-03 LAB — POCT URINALYSIS DIPSTICK
Blood, UA: NEGATIVE
Glucose, UA: NEGATIVE
Ketones, UA: NEGATIVE
NITRITE UA: NEGATIVE
PROTEIN UA: NEGATIVE

## 2014-12-03 LAB — URINE CULTURE
Culture: NO GROWTH
Special Requests: NORMAL

## 2014-12-03 NOTE — Progress Notes (Signed)
Low-risk OB appointment [id number] [redacted]w[redacted]d Estimated Date of Delivery: 12/06/14 BP 136/66 mmHg  Pulse 100  Wt 226 lb (102.513 kg)  LMP 02/13/2014 (Exact Date)  BP, weight, and urine reviewed.  Refer to obstetrical flow sheet for FH & FHR.  Reports good fm.  Denies regular uc's, lof, vb, or uti s/s. No complaints. SVE per request: 1.5/th/-2, vtx- not favorable for membrane stripping Reviewed labor s/s, fkc. IOL 8/20 @ [facility] for cervical ripening postdates Plan:  Continue routine obstetrical care  F/U in 1wk for OB appointment and nst

## 2014-12-03 NOTE — Patient Instructions (Signed)
Call the office [phone]) or go to [facility] if:  You begin to have strong, frequent contractions  Your water breaks.  Sometimes it is a big gush of fluid, sometimes it is just a trickle that keeps getting your panties wet or running down your legs  You have vaginal bleeding.  It is normal to have a small amount of spotting if your cervix was checked.   You don't feel your baby moving like normal.  If you don't, get you something to eat and drink and lay down and focus on feeling your baby move.  You should feel at least 10 movements in 2 hours.  If you don't, you should call the office or go to [place] Contractions Contractions of the uterus can occur throughout pregnancy. Contractions are not always a sign that you are in labor.  WHAT ARE BRAXTON HICKS CONTRACTIONS?  Contractions that occur before labor are called Braxton Hicks contractions, or false labor. Toward the end of pregnancy (32-34 weeks), these contractions can develop more often and may become more forceful. This is not true labor because these contractions do not result in opening (dilatation) and thinning of the cervix. They are sometimes difficult to tell apart from true labor because these contractions can be forceful and people have different pain tolerances. You should not feel embarrassed if you go to the hospital with false labor. Sometimes, the only way to tell if you are in true labor is for your health care provider to look for changes in the cervix. If there are no prenatal problems or other health problems associated with the pregnancy, it is completely safe to be sent home with false labor and await the onset of true labor. HOW CAN YOU TELL THE DIFFERENCE BETWEEN TRUE AND FALSE LABOR? False Labor  The contractions of false labor are usually shorter and not as hard as those of true labor.   The contractions are usually irregular.   The contractions are often felt in the front of  the lower abdomen and in the groin.   The contractions may go away when you walk around or change positions while lying down.   The contractions get weaker and are shorter lasting as time goes on.   The contractions do not usually become progressively stronger, regular, and closer together as with true labor.  True Labor  Contractions in true labor last 30-70 seconds, become very regular, usually become more intense, and increase in frequency.   The contractions do not go away with walking.   The discomfort is usually felt in the top of the uterus and spreads to the lower abdomen and low back.   True labor can be determined by your health care provider with an exam. This will show that the cervix is dilating and getting thinner.  WHAT TO REMEMBER  Keep up with your usual exercises and follow other instructions given by your health care provider.   Take medicines as directed by your health care provider.   Keep your regular prenatal appointments.   Eat and drink lightly if you think you are going into labor.   If Braxton Hicks contractions are making you uncomfortable:   Change your position from lying down or resting to walking, or from walking to resting.   Sit and rest in a tub of warm water.   Drink 2-3 glasses of water. Dehydration may cause these contractions.   Do slow and deep breathing several times an hour.  WHEN SHOULD I SEEK IMMEDIATE MEDICAL CARE? Seek immediate medical care if:  Your contractions become stronger, more regular, and closer together.   You have fluid leaking or gushing from your vagina.   You have a fever.   You pass blood-tinged mucus.   You have vaginal bleeding.   You have continuous abdominal pain.   You have low back pain that you never had before.   You feel your baby's head pushing down and causing pelvic pressure.   Your baby is not moving as much as it used to.  Document Released: 04/11/2005 Document  Revised: 04/16/2013 Document Reviewed: 01/21/2013 [facility] Patient Information 2015 [place], [place]. This information is not intended to replace advice given to you by your health care provider. Make sure you discuss any questions you have with your health care provider.

## 2014-12-05 ENCOUNTER — Encounter (HOSPITAL_COMMUNITY): Payer: Self-pay | Admitting: *Deleted

## 2014-12-05 ENCOUNTER — Telehealth (HOSPITAL_COMMUNITY): Payer: Self-pay | Admitting: *Deleted

## 2014-12-05 NOTE — Telephone Encounter (Signed)
Preadmission screen  

## 2014-12-06 ENCOUNTER — Encounter (HOSPITAL_COMMUNITY): Payer: Self-pay | Admitting: *Deleted

## 2014-12-06 ENCOUNTER — Inpatient Hospital Stay (HOSPITAL_COMMUNITY)
Admission: AD | Admit: 2014-12-06 | Discharge: 2014-12-06 | Disposition: A | Discharge status: Home / Self Care | Payer: 59 | Source: Ambulatory Visit | Attending: Obstetrics & Gynecology | Admitting: Obstetrics & Gynecology

## 2014-12-06 LAB — URINALYSIS, ROUTINE W REFLEX MICROSCOPIC
BILIRUBIN URINE: NEGATIVE
GLUCOSE, UA: NEGATIVE mg/dL
Ketones, ur: NEGATIVE mg/dL
Leukocytes, UA: NEGATIVE
Nitrite: NEGATIVE
PH: 6.5 (ref 5.0–8.0)
Protein, ur: NEGATIVE mg/dL
Specific Gravity, Urine: <1.005 — ABNORMAL LOW (ref 1.005–1.030)
UROBILINOGEN UA: 0.2 mg/dL (ref 0.0–1.0)

## 2014-12-06 LAB — URINE MICROSCOPIC-ADD ON

## 2014-12-06 NOTE — Progress Notes (Signed)
Called to notify of pt cervical exam and BP. Will discharge home

## 2014-12-06 NOTE — Discharge Instructions (Signed)
Third Trimester of Pregnancy The third trimester is from week 29 through week 42, months 7 through 9. The third trimester is a time when the fetus is growing rapidly. At the end of the ninth month, the fetus is about 20 inches in length and weighs 6-10 pounds.  BODY CHANGES Your body goes through many changes during pregnancy. The changes vary from woman to woman.   Your weight will continue to increase. You can expect to gain 25-35 pounds (11-16 kg) by the end of the pregnancy.  You may begin to get stretch marks on your hips, abdomen, and breasts.  You may urinate more often because the fetus is moving lower into your pelvis and pressing on your bladder.  You may develop or continue to have heartburn as a result of your pregnancy.  You may develop constipation because certain hormones are causing the muscles that push waste through your intestines to slow down.  You may develop hemorrhoids or swollen, bulging veins (varicose veins).  You may have pelvic pain because of the weight gain and pregnancy hormones relaxing your joints between the bones in your pelvis. Backaches may result from overexertion of the muscles supporting your posture.  You may have changes in your hair. These can include thickening of your hair, rapid growth, and changes in texture. Some women also have hair loss during or after pregnancy, or hair that feels dry or thin. Your hair will most likely return to normal after your baby is born.  Your breasts will continue to grow and be tender. A yellow discharge may leak from your breasts called colostrum.  Your belly button may stick out.  You may feel short of breath because of your expanding uterus.  You may notice the fetus "dropping," or moving lower in your abdomen.  You may have a bloody mucus discharge. This usually occurs a few days to a week before labor begins.  Your cervix becomes thin and soft (effaced) near your due date. WHAT TO EXPECT AT YOUR PRENATAL  EXAMS  You will have prenatal exams every 2 weeks until week 36. Then, you will have weekly prenatal exams. During a routine prenatal visit:  You will be weighed to make sure you and the fetus are growing normally.  Your blood pressure is taken.  Your abdomen will be measured to track your baby's growth.  The fetal heartbeat will be listened to.  Any test results from the previous visit will be discussed.  You may have a cervical check near your due date to see if you have effaced. At around 36 weeks, your caregiver will check your cervix. At the same time, your caregiver will also perform a test on the secretions of the vaginal tissue. This test is to determine if a type of bacteria, Group B streptococcus, is present. Your caregiver will explain this further. Your caregiver may ask you:  What your birth plan is.  How you are feeling.  If you are feeling the baby move.  If you have had any abnormal symptoms, such as leaking fluid, bleeding, severe headaches, or abdominal cramping.  If you have any questions. Other tests or screenings that may be performed during your third trimester include:  Blood tests that check for low iron levels (anemia).  Fetal testing to check the health, activity level, and growth of the fetus. Testing is done if you have certain medical conditions or if there are problems during the pregnancy. FALSE LABOR You may feel small, irregular contractions that  eventually go away. These are called Braxton Hicks contractions, or false labor. Contractions may last for hours, days, or even weeks before true labor sets in. If contractions come at regular intervals, intensify, or become painful, it is best to be seen by your caregiver.  SIGNS OF LABOR   Menstrual-like cramps.  Contractions that are 5 minutes apart or less.  Contractions that start on the top of the uterus and spread down to the lower abdomen and back.  A sense of increased pelvic pressure or back  pain.  A watery or bloody mucus discharge that comes from the vagina. If you have any of these signs before the 37th week of pregnancy, call your caregiver right away. You need to go to the hospital to get checked immediately. HOME CARE INSTRUCTIONS   Avoid all smoking, herbs, alcohol, and unprescribed drugs. These chemicals affect the formation and growth of the baby.  Follow your caregiver's instructions regarding medicine use. There are medicines that are either safe or unsafe to take during pregnancy.  Exercise only as directed by your caregiver. Experiencing uterine cramps is a good sign to stop exercising.  Continue to eat regular, healthy meals.  Wear a good support bra for breast tenderness.  Do not use hot tubs, steam rooms, or saunas.  Wear your seat belt at all times when driving.  Avoid raw meat, uncooked cheese, cat litter boxes, and soil used by cats. These carry germs that can cause birth defects in the baby.  Take your prenatal vitamins.  Try taking a stool softener (if your caregiver approves) if you develop constipation. Eat more high-fiber foods, such as fresh vegetables or fruit and whole grains. Drink plenty of fluids to keep your urine clear or pale yellow.  Take warm sitz baths to soothe any pain or discomfort caused by hemorrhoids. Use hemorrhoid cream if your caregiver approves.  If you develop varicose veins, wear support hose. Elevate your feet for 15 minutes, 3-4 times a day. Limit salt in your diet.  Avoid heavy lifting, wear low heal shoes, and practice good posture.  Rest a lot with your legs elevated if you have leg cramps or low back pain.  Visit your dentist if you have not gone during your pregnancy. Use a soft toothbrush to brush your teeth and be gentle when you floss.  A sexual relationship may be continued unless your caregiver directs you otherwise.  Do not travel far distances unless it is absolutely necessary and only with the approval  of your caregiver.  Take prenatal classes to understand, practice, and ask questions about the labor and delivery.  Make a trial run to the hospital.  Pack your hospital bag.  Prepare the baby's nursery.  Continue to go to all your prenatal visits as directed by your caregiver. SEEK MEDICAL CARE IF:  You are unsure if you are in labor or if your water has broken.  You have dizziness.  You have mild pelvic cramps, pelvic pressure, or nagging pain in your abdominal area.  You have persistent nausea, vomiting, or diarrhea.  You have a bad smelling vaginal discharge.  You have pain with urination. SEEK IMMEDIATE MEDICAL CARE IF:   You have a fever.  You are leaking fluid from your vagina.  You have spotting or bleeding from your vagina.  You have severe abdominal cramping or pain.  You have rapid weight loss or gain.  You have shortness of breath with chest pain.  You notice sudden or extreme swelling  of your face, hands, ankles, feet, or legs.  You have not felt your baby move in over an hour.  You have severe headaches that do not go away with medicine.  You have vision changes. Document Released: 04/05/2001 Document Revised: 04/16/2013 Document Reviewed: 06/12/2012 [facility] Patient Information 2015 [place], [place]. This information is not intended to replace advice given to you by your health care provider. Make sure you discuss any questions you have with your health care provider. Fetal Movement Counts Patient Name: __________________________________________________ Patient Due Date: ____________________ Performing a fetal movement count is highly recommended in high-risk pregnancies, but it is good for every pregnant woman to do. Your health care provider may ask you to start counting fetal movements at 28 weeks of the pregnancy. Fetal movements often increase:  After eating a full meal.  After physical activity.  After eating or drinking something sweet or  cold.  At rest. Pay attention to when you feel the baby is most active. This will help you notice a pattern of your baby's sleep and wake cycles and what factors contribute to an increase in fetal movement. It is important to perform a fetal movement count at the same time each day when your baby is normally most active.  HOW TO COUNT FETAL MOVEMENTS  Find a quiet and comfortable area to sit or lie down on your left side. Lying on your left side provides the best blood and oxygen circulation to your baby.  Write down the day and time on a sheet of paper or in a journal.  Start counting kicks, flutters, swishes, rolls, or jabs in a 2-hour period. You should feel at least 10 movements within 2 hours.  If you do not feel 10 movements in 2 hours, wait 2-3 hours and count again. Look for a change in the pattern or not enough counts in 2 hours. SEEK MEDICAL CARE IF:  You feel less than 10 counts in 2 hours, tried twice.  There is no movement in over an hour.  The pattern is changing or taking longer each day to reach 10 counts in 2 hours.  You feel the baby is not moving as he or she usually does. Date: ____________ Movements: ____________ Start time: ____________ Elizebeth Koller time: ____________  Date: ____________ Movements: ____________ Start time: ____________ Elizebeth Koller time: ____________ Date: ____________ Movements: ____________ Start time: ____________ Elizebeth Koller time: ____________ Date: ____________ Movements: ____________ Start time: ____________ Elizebeth Koller time: ____________ Date: ____________ Movements: ____________ Start time: ____________ Elizebeth Koller time: ____________ Date: ____________ Movements: ____________ Start time: ____________ Elizebeth Koller time: ____________ Date: ____________ Movements: ____________ Start time: ____________ Elizebeth Koller time: ____________ Date: ____________ Movements: ____________ Start time: ____________ Elizebeth Koller time: ____________  Date: ____________ Movements: ____________ Start time:  ____________ Elizebeth Koller time: ____________ Date: ____________ Movements: ____________ Start time: ____________ Elizebeth Koller time: ____________ Date: ____________ Movements: ____________ Start time: ____________ Elizebeth Koller time: ____________ Date: ____________ Movements: ____________ Start time: ____________ Elizebeth Koller time: ____________ Date: ____________ Movements: ____________ Start time: ____________ Elizebeth Koller time: ____________ Date: ____________ Movements: ____________ Start time: ____________ Elizebeth Koller time: ____________ Date: ____________ Movements: ____________ Start time: ____________ Elizebeth Koller time: ____________  Date: ____________ Movements: ____________ Start time: ____________ Elizebeth Koller time: ____________ Date: ____________ Movements: ____________ Start time: ____________ Elizebeth Koller time: ____________ Date: ____________ Movements: ____________ Start time: ____________ Elizebeth Koller time: ____________ Date: ____________ Movements: ____________ Start time: ____________ Elizebeth Koller time: ____________ Date: ____________ Movements: ____________ Start time: ____________ Elizebeth Koller time: ____________ Date: ____________ Movements: ____________ Start time: ____________ Elizebeth Koller time: ____________ Date: ____________ Movements: ____________ Start time: ____________ Elizebeth Koller time:  ____________  Date: ____________ Movements: ____________ Start time: ____________ Elizebeth Koller time: ____________ Date: ____________ Movements: ____________ Start time: ____________ Elizebeth Koller time: ____________ Date: ____________ Movements: ____________ Start time: ____________ Elizebeth Koller time: ____________ Date: ____________ Movements: ____________ Start time: ____________ Elizebeth Koller time: ____________ Date: ____________ Movements: ____________ Start time: ____________ Elizebeth Koller time: ____________ Date: ____________ Movements: ____________ Start time: ____________ Elizebeth Koller time: ____________ Date: ____________ Movements: ____________ Start time: ____________ Elizebeth Koller time: ____________  Date:  ____________ Movements: ____________ Start time: ____________ Elizebeth Koller time: ____________ Date: ____________ Movements: ____________ Start time: ____________ Elizebeth Koller time: ____________ Date: ____________ Movements: ____________ Start time: ____________ Elizebeth Koller time: ____________ Date: ____________ Movements: ____________ Start time: ____________ Elizebeth Koller time: ____________ Date: ____________ Movements: ____________ Start time: ____________ Elizebeth Koller time: ____________ Date: ____________ Movements: ____________ Start time: ____________ Elizebeth Koller time: ____________ Date: ____________ Movements: ____________ Start time: ____________ Elizebeth Koller time: ____________  Date: ____________ Movements: ____________ Start time: ____________ Elizebeth Koller time: ____________ Date: ____________ Movements: ____________ Start time: ____________ Elizebeth Koller time: ____________ Date: ____________ Movements: ____________ Start time: ____________ Elizebeth Koller time: ____________ Date: ____________ Movements: ____________ Start time: ____________ Elizebeth Koller time: ____________ Date: ____________ Movements: ____________ Start time: ____________ Elizebeth Koller time: ____________ Date: ____________ Movements: ____________ Start time: ____________ Elizebeth Koller time: ____________ Date: ____________ Movements: ____________ Start time: ____________ Elizebeth Koller time: ____________  Date: ____________ Movements: ____________ Start time: ____________ Elizebeth Koller time: ____________ Date: ____________ Movements: ____________ Start time: ____________ Elizebeth Koller time: ____________ Date: ____________ Movements: ____________ Start time: ____________ Elizebeth Koller time: ____________ Date: ____________ Movements: ____________ Start time: ____________ Elizebeth Koller time: ____________ Date: ____________ Movements: ____________ Start time: ____________ Elizebeth Koller time: ____________ Date: ____________ Movements: ____________ Start time: ____________ Elizebeth Koller time: ____________ Date: ____________ Movements: ____________ Start  time: ____________ Elizebeth Koller time: ____________  Date: ____________ Movements: ____________ Start time: ____________ Elizebeth Koller time: ____________ Date: ____________ Movements: ____________ Start time: ____________ Elizebeth Koller time: ____________ Date: ____________ Movements: ____________ Start time: ____________ Elizebeth Koller time: ____________ Date: ____________ Movements: ____________ Start time: ____________ Elizebeth Koller time: ____________ Date: ____________ Movements: ____________ Start time: ____________ Elizebeth Koller time: ____________ Date: ____________ Movements: ____________ Start time: ____________ Elizebeth Koller time: ____________ Document Released: 05/11/2006 Document Revised: 08/26/2013 Document Reviewed: 02/06/2012 [employer] Patient Information 2015 [employer]. This information is not intended to replace advice given to you by your health care provider. Make sure you discuss any questions you have with your health care provider. Braxton Hicks Contractions Contractions of the uterus can occur throughout pregnancy. Contractions are not always a sign that you are in labor.  WHAT ARE BRAXTON HICKS CONTRACTIONS?  Contractions that occur before labor are called Braxton Hicks contractions, or false labor. Toward the end of pregnancy (32-34 weeks), these contractions can develop more often and may become more forceful. This is not true labor because these contractions do not result in opening (dilatation) and thinning of the cervix. They are sometimes difficult to tell apart from true labor because these contractions can be forceful and people have different pain tolerances. You should not feel embarrassed if you go to the hospital with false labor. Sometimes, the only way to tell if you are in true labor is for your health care provider to look for changes in the cervix. If there are no prenatal problems or other health problems associated with the pregnancy, it is completely safe to be sent home with false labor and await the  onset of true labor. HOW CAN YOU TELL THE DIFFERENCE BETWEEN TRUE AND FALSE LABOR? False Labor  The contractions of false labor are usually shorter and not as hard as those of true labor.   The contractions  are usually irregular.   The contractions are often felt in the front of the lower abdomen and in the groin.   The contractions may go away when you walk around or change positions while lying down.   The contractions get weaker and are shorter lasting as time goes on.   The contractions do not usually become progressively stronger, regular, and closer together as with true labor.  True Labor  Contractions in true labor last 30-70 seconds, become very regular, usually become more intense, and increase in frequency.   The contractions do not go away with walking.   The discomfort is usually felt in the top of the uterus and spreads to the lower abdomen and low back.   True labor can be determined by your health care provider with an exam. This will show that the cervix is dilating and getting thinner.  WHAT TO REMEMBER  Keep up with your usual exercises and follow other instructions given by your health care provider.   Take medicines as directed by your health care provider.   Keep your regular prenatal appointments.   Eat and drink lightly if you think you are going into labor.   If Braxton Hicks contractions are making you uncomfortable:   Change your position from lying down or resting to walking, or from walking to resting.   Sit and rest in a tub of warm water.   Drink 2-3 glasses of water. Dehydration may cause these contractions.   Do slow and deep breathing several times an hour.  WHEN SHOULD I SEEK IMMEDIATE MEDICAL CARE? Seek immediate medical care if:  Your contractions become stronger, more regular, and closer together.   You have fluid leaking or gushing from your vagina.   You have a fever.   You pass blood-tinged mucus.    You have vaginal bleeding.   You have continuous abdominal pain.   You have low back pain that you never had before.   You feel your baby's head pushing down and causing pelvic pressure.   Your baby is not moving as much as it used to.  Document Released: 04/11/2005 Document Revised: 04/16/2013 Document Reviewed: 01/21/2013 [facility] Patient Information 2015 [place], [place]. This information is not intended to replace advice given to you by your health care provider. Make sure you discuss any questions you have with your health care provider.

## 2014-12-06 NOTE — MAU Note (Signed)
Pt states that contractions started today at 0200 and haven't stopped.  Pt states BP was 197/73 around 1400 and then was 151/90 about 5 minutes later.  Pt states no bleeding but has discharge that has been there throughout the course of her pregnancy.  Pt states there has been no gush of fluid.  Pt states that she is feeling the baby move but it slightly less than normal today.

## 2014-12-07 ENCOUNTER — Encounter (HOSPITAL_COMMUNITY): Payer: Self-pay | Admitting: *Deleted

## 2014-12-07 ENCOUNTER — Inpatient Hospital Stay (HOSPITAL_COMMUNITY)
Admission: AD | Admit: 2014-12-07 | Discharge: 2014-12-08 | DRG: 775 | Disposition: A | Discharge status: Home / Self Care | Payer: 59 | Source: Ambulatory Visit | Attending: Obstetrics & Gynecology | Admitting: Obstetrics & Gynecology

## 2014-12-07 ENCOUNTER — Inpatient Hospital Stay (HOSPITAL_COMMUNITY): Payer: 59 | Admitting: Anesthesiology

## 2014-12-07 DIAGNOSIS — E282 Polycystic ovarian syndrome: Secondary | ICD-10-CM | POA: Diagnosis present

## 2014-12-07 DIAGNOSIS — O9962 Diseases of the digestive system complicating childbirth: Secondary | ICD-10-CM | POA: Diagnosis present

## 2014-12-07 DIAGNOSIS — Z87891 Personal history of nicotine dependence: Secondary | ICD-10-CM | POA: Diagnosis not present

## 2014-12-07 DIAGNOSIS — R0602 Shortness of breath: Secondary | ICD-10-CM | POA: Diagnosis present

## 2014-12-07 DIAGNOSIS — Z3493 Encounter for supervision of normal pregnancy, unspecified, third trimester: Secondary | ICD-10-CM

## 2014-12-07 DIAGNOSIS — O139 Gestational [pregnancy-induced] hypertension without significant proteinuria, unspecified trimester: Secondary | ICD-10-CM | POA: Diagnosis present

## 2014-12-07 DIAGNOSIS — O9952 Diseases of the respiratory system complicating childbirth: Secondary | ICD-10-CM | POA: Diagnosis present

## 2014-12-07 DIAGNOSIS — K219 Gastro-esophageal reflux disease without esophagitis: Secondary | ICD-10-CM | POA: Diagnosis present

## 2014-12-07 DIAGNOSIS — IMO0001 Reserved for inherently not codable concepts without codable children: Secondary | ICD-10-CM

## 2014-12-07 DIAGNOSIS — Z3A00 Weeks of gestation of pregnancy not specified: Secondary | ICD-10-CM

## 2014-12-07 DIAGNOSIS — O99214 Obesity complicating childbirth: Secondary | ICD-10-CM | POA: Diagnosis present

## 2014-12-07 DIAGNOSIS — E669 Obesity, unspecified: Secondary | ICD-10-CM | POA: Diagnosis present

## 2014-12-07 DIAGNOSIS — IMO0002 Reserved for concepts with insufficient information to code with codable children: Secondary | ICD-10-CM

## 2014-12-07 LAB — CBC
HCT: 35.8 % — ABNORMAL LOW (ref 36.0–46.0)
HEMOGLOBIN: 12 g/dL (ref 12.0–15.0)
MCH: 28.4 pg (ref 26.0–34.0)
MCHC: 33.5 g/dL (ref 30.0–36.0)
MCV: 84.8 fL (ref 78.0–100.0)
PLATELETS: 479 10*3/uL — AB (ref 150–400)
RBC: 4.22 MIL/uL (ref 3.87–5.11)
RDW: 14.8 % (ref 11.5–15.5)
WBC: 19.1 10*3/uL — ABNORMAL HIGH (ref 4.0–10.5)

## 2014-12-07 LAB — TYPE AND SCREEN
ABO/RH(D): O POS
ANTIBODY SCREEN: NEGATIVE

## 2014-12-07 MED ORDER — EPHEDRINE 5 MG/ML INJ: 10.0000 mg | INTRAVENOUS | Status: DC | PRN

## 2014-12-07 MED ORDER — FENTANYL 2.5 MCG/ML BUPIVACAINE 1/10 % EPIDURAL INFUSION (WH - ANES)
14.0000 mL/h | INTRAMUSCULAR | Status: DC | PRN
  Filled 2014-12-07: qty 125

## 2014-12-07 MED ORDER — DIBUCAINE 1 % RE OINT: 1.0000 "application " | TOPICAL_OINTMENT | RECTAL | Status: DC | PRN

## 2014-12-07 MED ORDER — OXYCODONE-ACETAMINOPHEN 5-325 MG PO TABS: 2.0000 | ORAL_TABLET | ORAL | Status: DC | PRN

## 2014-12-07 MED ORDER — OXYCODONE-ACETAMINOPHEN 5-325 MG PO TABS: 1.0000 | ORAL_TABLET | ORAL | Status: DC | PRN

## 2014-12-07 MED ORDER — OXYTOCIN BOLUS FROM INFUSION
500.0000 mL | INTRAVENOUS | Status: DC
  Administered 2014-12-07: 500 mL via INTRAVENOUS

## 2014-12-07 MED ORDER — FLEET ENEMA 7-19 GM/118ML RE ENEM: 1.0000 | ENEMA | RECTAL | Status: DC | PRN

## 2014-12-07 MED ORDER — LACTATED RINGERS IV SOLN
INTRAVENOUS | Status: DC
  Administered 2014-12-07: 10:00:00 via INTRAVENOUS

## 2014-12-07 MED ORDER — ONDANSETRON HCL 4 MG PO TABS: 4.0000 mg | ORAL_TABLET | ORAL | Status: DC | PRN

## 2014-12-07 MED ORDER — BENZOCAINE-MENTHOL 20-0.5 % EX AERO
1.0000 | INHALATION_SPRAY | CUTANEOUS | Status: DC | PRN
Start: 2014-12-07 — End: 2014-12-08
  Administered 2014-12-08: 1 via TOPICAL
  Filled 2014-12-07: qty 56

## 2014-12-07 MED ORDER — AMPICILLIN SODIUM 2 G IJ SOLR
2.0000 g | Freq: Once | INTRAMUSCULAR | Status: DC
  Filled 2014-12-07: qty 2000

## 2014-12-07 MED ORDER — PHENYLEPHRINE 40 MCG/ML (10ML) SYRINGE FOR IV PUSH (FOR BLOOD PRESSURE SUPPORT): 80.0000 ug | PREFILLED_SYRINGE | INTRAVENOUS | Status: DC | PRN

## 2014-12-07 MED ORDER — PHENYLEPHRINE 40 MCG/ML (10ML) SYRINGE FOR IV PUSH (FOR BLOOD PRESSURE SUPPORT)
80.0000 ug | PREFILLED_SYRINGE | INTRAVENOUS | Status: DC | PRN
  Filled 2014-12-07: qty 20

## 2014-12-07 MED ORDER — DIPHENHYDRAMINE HCL 25 MG PO CAPS: 25.0000 mg | ORAL_CAPSULE | Freq: Four times a day (QID) | ORAL | Status: DC | PRN

## 2014-12-07 MED ORDER — ZOLPIDEM TARTRATE 5 MG PO TABS: 5.0000 mg | ORAL_TABLET | Freq: Every evening | ORAL | Status: DC | PRN

## 2014-12-07 MED ORDER — DIPHENHYDRAMINE HCL 50 MG/ML IJ SOLN: 12.5000 mg | INTRAMUSCULAR | Status: DC | PRN

## 2014-12-07 MED ORDER — OXYTOCIN 40 UNITS IN LACTATED RINGERS INFUSION - SIMPLE MED
INTRAVENOUS | Status: AC
  Administered 2014-12-07: 62.5 mL/h via INTRAVENOUS
  Filled 2014-12-07: qty 1000

## 2014-12-07 MED ORDER — CITRIC ACID-SODIUM CITRATE 334-500 MG/5ML PO SOLN: 30.0000 mL | ORAL | Status: DC | PRN

## 2014-12-07 MED ORDER — ONDANSETRON HCL 4 MG/2ML IJ SOLN: 4.0000 mg | Freq: Four times a day (QID) | INTRAMUSCULAR | Status: DC | PRN

## 2014-12-07 MED ORDER — PRENATAL MULTIVITAMIN CH
1.0000 | ORAL_TABLET | Freq: Every day | ORAL | Status: DC
  Administered 2014-12-08: 1 via ORAL
  Filled 2014-12-07: qty 1

## 2014-12-07 MED ORDER — SIMETHICONE 80 MG PO CHEW: 80.0000 mg | CHEWABLE_TABLET | ORAL | Status: DC | PRN

## 2014-12-07 MED ORDER — SENNOSIDES-DOCUSATE SODIUM 8.6-50 MG PO TABS
2.0000 | ORAL_TABLET | ORAL | Status: DC
  Administered 2014-12-07: 2 via ORAL
  Filled 2014-12-07: qty 2

## 2014-12-07 MED ORDER — ACETAMINOPHEN 325 MG PO TABS: 650.0000 mg | ORAL_TABLET | ORAL | Status: DC | PRN

## 2014-12-07 MED ORDER — LIDOCAINE HCL (PF) 1 % IJ SOLN
INTRAMUSCULAR | Status: AC
  Administered 2014-12-07 (×2): 4 mL via EPIDURAL
  Filled 2014-12-07: qty 30

## 2014-12-07 MED ORDER — LACTATED RINGERS IV SOLN: INTRAVENOUS | Status: DC

## 2014-12-07 MED ORDER — WITCH HAZEL-GLYCERIN EX PADS: 1.0000 "application " | MEDICATED_PAD | CUTANEOUS | Status: DC | PRN

## 2014-12-07 MED ORDER — IBUPROFEN 600 MG PO TABS
600.0000 mg | ORAL_TABLET | Freq: Four times a day (QID) | ORAL | Status: DC
  Administered 2014-12-07 – 2014-12-08 (×5): 600 mg via ORAL
  Filled 2014-12-07 (×5): qty 1

## 2014-12-07 MED ORDER — LACTATED RINGERS IV SOLN: 500.0000 mL | INTRAVENOUS | Status: DC | PRN

## 2014-12-07 MED ORDER — FENTANYL 2.5 MCG/ML BUPIVACAINE 1/10 % EPIDURAL INFUSION (WH - ANES)
14.0000 mL/h | INTRAMUSCULAR | Status: DC | PRN
  Administered 2014-12-07: 14 mL/h via EPIDURAL

## 2014-12-07 MED ORDER — TETANUS-DIPHTH-ACELL PERTUSSIS 5-2.5-18.5 LF-MCG/0.5 IM SUSP: 0.5000 mL | Freq: Once | INTRAMUSCULAR | Status: DC

## 2014-12-07 MED ORDER — LANOLIN HYDROUS EX OINT: TOPICAL_OINTMENT | CUTANEOUS | Status: DC | PRN

## 2014-12-07 MED ORDER — LACTATED RINGERS IV SOLN
500.0000 mL | INTRAVENOUS | Status: DC | PRN
  Administered 2014-12-07: 500 mL via INTRAVENOUS

## 2014-12-07 MED ORDER — ONDANSETRON HCL 4 MG/2ML IJ SOLN: 4.0000 mg | INTRAMUSCULAR | Status: DC | PRN

## 2014-12-07 MED ORDER — LIDOCAINE HCL (PF) 1 % IJ SOLN: 30.0000 mL | INTRAMUSCULAR | Status: DC | PRN

## 2014-12-07 MED ORDER — OXYTOCIN 40 UNITS IN LACTATED RINGERS INFUSION - SIMPLE MED
62.5000 mL/h | INTRAVENOUS | Status: DC
  Administered 2014-12-07: 62.5 mL/h via INTRAVENOUS

## 2014-12-07 NOTE — H&P (Signed)
Dana Mcpherson is a 24 y.o. female presenting for active labor with impending delivery.Marland Kitchen History OB History    Gravida Para Term Preterm AB TAB SAB Ectopic Multiple Living   3 1 1  1  1   1       Obstetric Comments   IOL for pre-e     Past Medical History  Diagnosis Date  . Allergy   . Migraine headache   . Polycystic ovarian syndrome   . Pregnancy induced hypertension   . Vaginal Pap smear, abnormal   . Complication of anesthesia     woke up during surgery & ithing after surgery  . Anxiety   . Tachycardia    Past Surgical History  Procedure Laterality Date  . Cholecystectomy     Family History: family history includes Cancer in her paternal grandmother. Social History:  reports that she has quit smoking. She has never used smokeless tobacco. She reports that she does not drink alcohol or use illicit drugs.   Prenatal Transfer Tool  Maternal Diabetes: No Genetic Screening: Normal Maternal Ultrasounds/Referrals: Normal Fetal Ultrasounds or other Referrals:  None Maternal Substance Abuse:  No Significant Maternal Medications:  None Significant Maternal Lab Results:  None Other Comments:  None  Review of Systems  Constitutional: Negative.   HENT: Negative.   Eyes: Negative.   Respiratory: Negative.   Cardiovascular: Negative.   Gastrointestinal: Positive for abdominal pain.  Genitourinary: Negative.   Musculoskeletal: Negative.   Skin: Negative.   Neurological: Negative.   Endo/Heme/Allergies: Negative.   Psychiatric/Behavioral: Negative.     Dilation: 9 Effacement (%): 100 Station: 0 Exam by:: d. Dana Mcpherson cnm  Blood pressure 134/84, pulse 97, temperature 98 F (36.7 C), temperature source Oral, resp. rate 22, height 5' 6.5" (1.689 m), weight 226 lb (102.513 kg), last menstrual period 02/13/2014. Maternal Exam:  Uterine Assessment: Contraction strength is firm.  Contraction frequency is regular.   Abdomen: Fundal height is 40.   Estimated fetal weight is  8.5.   Fetal presentation: vertex  Introitus: Normal vulva. Normal vagina.  Amniotic fluid character: meconium stained.  Pelvis: adequate for delivery.   Cervix: Cervix evaluated by digital exam.     Fetal Exam Fetal Monitor Review: Mode: ultrasound.   Variability: moderate (6-25 bpm).    Fetal State Assessment: Category I - tracings are normal.     Physical Exam  Constitutional: She is oriented to person, place, and time. She appears well-developed and well-nourished.  HENT:  Head: Normocephalic.  Eyes: Pupils are equal, round, and reactive to light.  Neck: Normal range of motion.  Cardiovascular: Normal rate, regular rhythm, normal heart sounds and intact distal pulses.   Respiratory: Effort normal and breath sounds normal.  GI: Soft. Bowel sounds are normal.  Genitourinary: Vagina normal and uterus normal.  Musculoskeletal: Normal range of motion.  Neurological: She is alert and oriented to person, place, and time. She has normal reflexes.  Skin: Skin is warm and dry.  Psychiatric: She has a normal mood and affect. Her behavior is normal. Judgment and thought content normal.    Prenatal labs: ABO, Rh: --/--/O POS (12/03 2010) Antibody: Negative (05/17 0902) Rubella: 1.39 (12/29 1128) RPR: Non Reactive (05/17 0902)  HBsAg: NEGATIVE (12/29 1128)  HIV: NONREACTIVE (12/03 2003)  GBS: Negative (07/21 0000)   Assessment/Plan: SVE 8-9 cm/100/0. AROM mec stainned fluid. GBS neg   Dana Mcpherson Dana Mcpherson 12/07/2014, 10:05 AM

## 2014-12-07 NOTE — MAU Note (Signed)
Patient presents at term gestation with c/o contractions every 2 minutes since 5 or 6 this morning. Fetus active. Denies bleeding but states her mucus plug fell out.

## 2014-12-07 NOTE — Anesthesia Procedure Notes (Signed)
Epidural Patient location during procedure: OB Start time: 12/07/2014 10:45 AM  Staffing Anesthesiologist: Josephine Igo Performed by: anesthesiologist   Preanesthetic Checklist Completed: patient identified, site marked, surgical consent, pre-op evaluation, timeout performed, IV checked, risks and benefits discussed and monitors and equipment checked  Epidural Patient position: sitting Prep: site prepped and draped and DuraPrep Patient monitoring: continuous pulse ox and blood pressure Approach: midline Location: L4-L5 Injection technique: LOR air  Needle:  Needle type: Tuohy  Needle gauge: 17 G Needle length: 9 cm and 9 Needle insertion depth: 5 cm cm Catheter type: closed end flexible Catheter size: 19 Gauge Catheter at skin depth: 10 cm Test dose: negative and Other  Assessment Events: blood not aspirated, injection not painful, no injection resistance, negative IV test and no paresthesia  Additional Notes Patient identified. Risks and benefits discussed including failed block, incomplete  Pain control, post dural puncture headache, nerve damage, paralysis, blood pressure Changes, nausea, vomiting, reactions to medications-both toxic and allergic and post Partum back pain. All questions were answered. Patient expressed understanding and wished to proceed. Sterile technique was used throughout procedure. Epidural site was Dressed with sterile barrier dressing. No paresthesias, signs of intravascular injection Or signs of intrathecal spread were encountered.  Patient was more comfortable after the epidural was dosed. Please see RN's note for documentation of vital signs and FHR which are stable.

## 2014-12-07 NOTE — Progress Notes (Signed)
Delivery Note  First Stage: Labor onset: 5 am on 12/07/2014 Augmentation : None Analgesia /Anesthesia intrapartum: epidrual AROM at 1003 moderate meconium  Second Stage: Complete dilation at 1215  Onset of pushing at 1215 FHR second stage 125  Delivery of a viable infant female at 1232  by CNM in  OA position No nuchal cord Cord double clamped after cessation of pulsation, cut by FOB Cord blood sample collected   Instrumental delivery? No    Third Stage: Placenta delivered shutlz presentation intact with 3 VC @ 1236 Placenta disposition: hospital disposal Uterine tone firm/ bleeding mild  No laceration identified  Est. Blood Loss (mL): 657  Complications: None  Mom to postpartum.  Baby to Couplet care / Skin to Skin.  Newborn: Birth Weight: pending  Apgar Scores: 8/9 Feeding planned: breast  Mervyn Skeeters [facility] 12/07/2014, 12:53 PM

## 2014-12-07 NOTE — Anesthesia Preprocedure Evaluation (Signed)
Anesthesia Evaluation  Patient identified by MRN, date of birth, ID band Patient awake    Reviewed: Allergy & Precautions, Patient's Chart, lab work & pertinent test results  History of Anesthesia Complications (+) history of anesthetic complications  Airway Mallampati: III  TM Distance: >3 FB Neck ROM: Full    Dental no notable dental hx. (+) Teeth Intact   Pulmonary shortness of breath, former smoker,  breath sounds clear to auscultation  Pulmonary exam normal       Cardiovascular hypertension, Pt. on medications Normal cardiovascular examRhythm:Regular Rate:Normal     Neuro/Psych  Headaches, Anxiety negative psych ROS   GI/Hepatic Neg liver ROS, GERD-  Medicated and Controlled,  Endo/Other  Obesity PCOS  Renal/GU negative Renal ROS  negative genitourinary   Musculoskeletal negative musculoskeletal ROS (+)   Abdominal (+) + obese,   Peds  Hematology negative hematology ROS (+)   Anesthesia Other Findings   Reproductive/Obstetrics (+) Pregnancy PIH                             Anesthesia Physical Anesthesia Plan  ASA: II  Anesthesia Plan: Epidural   Post-op Pain Management:    Induction:   Airway Management Planned: Natural Airway  Additional Equipment:   Intra-op Plan:   Post-operative Plan:   Informed Consent: I have reviewed the patients History and Physical, chart, labs and discussed the procedure including the risks, benefits and alternatives for the proposed anesthesia with the patient or authorized representative who has indicated his/her understanding and acceptance.     Plan Discussed with: Anesthesiologist  Anesthesia Plan Comments:         Anesthesia Quick Evaluation

## 2014-12-08 LAB — RPR: RPR: NONREACTIVE

## 2014-12-08 LAB — ABO/RH: ABO/RH(D): O POS

## 2014-12-08 MED ORDER — IBUPROFEN 600 MG PO TABS: 600.0000 mg | ORAL_TABLET | Freq: Four times a day (QID) | ORAL | Status: DC

## 2014-12-08 MED ORDER — NORETHINDRONE 0.35 MG PO TABS: 1.0000 | ORAL_TABLET | Freq: Every day | ORAL | Status: DC

## 2014-12-08 NOTE — Discharge Summary (Signed)
Obstetric Discharge Summary Reason for Admission: onset of labor Prenatal Procedures: ultrasound Intrapartum Procedures: spontaneous vaginal delivery Postpartum Procedures: none Complications-Operative and Postpartum: none HEMOGLOBIN  Date Value Ref Range Status  12/07/2014 12.0 12.0 - 15.0 g/dL Final   HCT  Date Value Ref Range Status  12/07/2014 35.8* 36.0 - 46.0 % Final   HEMATOCRIT  Date Value Ref Range Status  09/09/2014 35.3 34.0 - 46.6 % Final    Physical Exam:  General: alert, cooperative, appears stated age and no distress Lochia: appropriate Uterine Fundus: firm Incision: n/a DVT Evaluation: No evidence of DVT seen on physical exam. Negative Homan's sign. No cords or calf tenderness.  Discharge Diagnoses: Term Pregnancy-delivered  Discharge Information: Date: 12/08/2014 Activity: pelvic rest Diet: routine Medications: PNV, Ibuprofen and Colace Condition: stable and improved Instructions: refer to practice specific booklet Discharge to: home   Newborn Data: Live born female  Birth Weight: 8 lb 14.9 oz (4051 g) APGAR: 8, 9  Home with mother.  Dana Mcpherson [name] 12/08/2014, 6:58 AM

## 2014-12-08 NOTE — Lactation Note (Signed)
This note was copied from the chart of Dana Mcpherson. Lactation Consultation Note: Mother wants early discharge. Infant is 1 hours old. He has had 13  feedings, 2 dirty diapers and one wet. Mother is concerned about history of PCOS and if she needs to supplement infant with formula. Mother has good flow of colostrum when hand expresses. Mother has semi-flat nipples . Assist mother with latching infant on the left breast in cross cradle hold. Mother taught tea-cup hold and latched infant. Infant sustained latch for 20 mins with audible swallows. Mother taught cup feeding and syringe feeding. Mother independently  latched infant to alternate breast for 25 mins. Parents given lots of information on topics in baby and me book, breastfeeding on cue , cluster feeding. Advised to follow AAP guidelines if plan to supplement infant with EBM/formula. Parents receptive to all teaching.  Mother states that she has Medicaid and [place] health care. Advised mother to phone insurance company about getting an electric pump. Mother was offered a 2 week rental. Mother will page is she decides to take home a pump. Staff nurse to give mother a hand pump.   Patient Name: Dana Martinique Tretter Today's Date: 12/08/2014 Reason for consult: Follow-up assessment   Maternal Data    Feeding Feeding Type: Breast Milk Length of feed: 25 min  LATCH Score/Interventions Latch: Grasps breast easily, tongue down, lips flanged, rhythmical sucking.  Audible Swallowing: Spontaneous and intermittent  Type of Nipple: Flat  Comfort (Breast/Nipple): Soft / non-tender     Hold (Positioning): Assistance needed to correctly position infant at breast and maintain latch. Intervention(s): Breastfeeding basics reviewed;Support Pillows;Position options  LATCH Score: 8  Lactation Tools Discussed/Used     Consult Status Consult Status: Follow-up    Jess Barters [facility] 12/08/2014, 4:09 PM

## 2014-12-08 NOTE — Anesthesia Postprocedure Evaluation (Signed)
  Anesthesia Post-op Note  Patient: Dana Mcpherson  Procedure(s) Performed: * No procedures listed *  Patient Location: Mother/Baby  Anesthesia Type:Epidural  Level of Consciousness: awake  Airway and Oxygen Therapy: Patient Spontanous Breathing  Post-op Pain: mild  Post-op Assessment: Patient's Cardiovascular Status Stable and Respiratory Function Stable              Post-op Vital Signs: stable  Last Vitals:  Filed Vitals:   12/08/14 0512  BP: 103/56  Pulse: 82  Temp: 36.4 C  Resp: 16    Complications: No apparent anesthesia complications

## 2014-12-08 NOTE — Clinical Social Work Maternal (Signed)
  CLINICAL SOCIAL WORK MATERNAL/CHILD NOTE  Patient Details  Name: Dana Mcpherson MRN: [id number] Date of Birth: 08-03-1990  Date:  12/08/2014  Clinical Social Worker Initiating Note:  Lucita Ferrara, LCSW Date/ Time Initiated:  12/08/14/1200     Child's Name:  Max   Legal Guardian:  Dana Mcpherson and Dana Mcpherson (parents)  Need for Interpreter:  None   Date of Referral:  12/07/14     Reason for Referral:  History of anxiety  Referral Source:  [facility]   Address:  [place], [place] [address]  Phone number:  [phone]   Household Members:  Significant Other, Minor Children   Natural Supports (not living in the home):  Immediate Family, Extended Family   Professional Supports: None   Employment: Homemaker   Type of Work:   N/A  Education:    N/A  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  During the pregnancy, MOB reported numerous times that she was experiencing chest pain and shortness of breath.  MOB referred to cardiology, no follow up recommendations received.   MOB presented as easily engaged and receptive to [place] visit. She provided consent for the FOB and the FOB's two aunts to remain in the room during the assessment.  MOB presented in a pleasant mood and displayed a full range in affect. No anxiety observed or noted in her cognitions.   MOB denied questions, concerns, or needs as she transitions postpartum. She stated that she is eager to be discharged home, and hopes  for an early discharge.  MOB shared that her first child is 26 years old, and is looking forward to transitioning to caring for two children.  MOB denied presence of acute psychosocial stressors that may impact her transition postpartum, and discussed feeling well supported.    MOB reported history of anxiety since childhood. She stated that she has previously had panic attacks, but denied presence of perinatal mood and anxiety disorders.  CSW inquired about chest pain and SOB that was experienced during this pregnancy, but MOB reported strong belief that it was related to pregnancy induced heart difficulties. MOB shared belief that it was not related to anxiety, as she denied presence of stress or other factors that may elicit anxious thoughts or feelings.  MOB discussed belief that SOB and chest pain should be reduced/no longer present since she is no longer pregnant, but acknowledged the importance of closely monitoring her thoughts and feelings in the event that symptoms may have been linked to her mental health.  MOB denied feeling concerned about her current mental health, but agreed to contact her medical provider if she notes ongoing chest pain/SOB.  MOB expressed appreciation for the visit, and agreed to contact CSW If needs arise during the admission.   CSW Plan/Description:   1)Patient/Family Education: Perinatal mood and anxiety disorders 2)No Further Intervention Required/No Barriers to Discharge    Sharyl Nimrod 12/08/2014, 1:18 PM

## 2014-12-10 ENCOUNTER — Other Ambulatory Visit: Payer: 59 | Admitting: Advanced Practice Midwife

## 2014-12-13 ENCOUNTER — Inpatient Hospital Stay (HOSPITAL_COMMUNITY): Admission: RE | Admit: 2014-12-13 | Payer: 59 | Source: Ambulatory Visit

## 2014-12-14 ENCOUNTER — Inpatient Hospital Stay (HOSPITAL_COMMUNITY): Payer: 59

## 2015-01-02 ENCOUNTER — Telehealth: Payer: Self-pay | Admitting: *Deleted

## 2015-01-02 NOTE — Telephone Encounter (Signed)
Pt called stating she is on a birth control pill but has noticed a decrease in her milk supply and her baby is constipated. Pt is nursing or pumping every 2 hours. I advised to continue pumping or nursing like she's been doing. Advised also to drink plenty of fluids and eat well balanced meals. Advised to try Fenugreek to see if that will help increase milk supply. She should know within the 1st 2 weeks of taking med. Advised to call the baby's dr about the constipation. Pt voiced understanding. [place]

## 2015-01-20 ENCOUNTER — Encounter: Payer: Self-pay | Admitting: Women's Health

## 2015-01-20 ENCOUNTER — Ambulatory Visit (INDEPENDENT_AMBULATORY_CARE_PROVIDER_SITE_OTHER): Payer: 59 | Admitting: Women's Health

## 2015-01-20 DIAGNOSIS — K649 Unspecified hemorrhoids: Secondary | ICD-10-CM

## 2015-01-20 MED ORDER — HYDROCORTISONE 2.5 % RE CREA: 1.0000 "application " | TOPICAL_CREAM | Freq: Two times a day (BID) | RECTAL | Status: DC

## 2015-01-20 NOTE — Progress Notes (Signed)
Patient ID: Dana Mcpherson, female   DOB: 1991/02/10, 24 y.o.   MRN: [id number] Subjective:    Dana Mcpherson is a 24 y.o. G57P2012 Caucasian female who presents for a postpartum visit. She is 6 weeks postpartum following a spontaneous vaginal delivery at 40.1 gestational weeks. Anesthesia: epidural. I have fully reviewed the prenatal and intrapartum course. Postpartum course has been uncomplicated. Baby's course has been complicated by reflux, colic, problems w/ bm's- sees GI @ [facility] tomorrow. Baby is feeding by breast. Feels supply is going to be too low for him when he starts eating more. Supply is ok now. Bleeding no bleeding. Bowel function is thinks she has hemorrhoid- has pain and bleeding w/ bm's. BM's qod, semi-soft, sometimes hard, doesn't have to strain. . Bladder function is normal. Patient is not sexually active. Last sexual activity: prior to birth of baby. Contraception method is already taking POPs, husband getting vasectomy. Postpartum depression screening: negative. Score 3.  Last pap 06/07/14 and was ASCUS w/ +HRHPV w/ normal colpo.  The following portions of the patient's history were reviewed and updated as appropriate: allergies, current medications, past medical history, past surgical history and problem list.  Review of Systems Pertinent items are noted in HPI.   Filed Vitals:   01/20/15 0940  BP: 114/62  Pulse: 68  Weight: 214 lb (97.07 kg)   No LMP recorded.  Objective:   General:  alert, cooperative and no distress   Breasts:  deferred, no complaints  Lungs: clear to auscultation bilaterally  Heart:  regular rate and rhythm  Abdomen: soft, nontender   Vulva: normal  Vagina: normal vagina  Cervix:  closed  Corpus: Well-involuted  Adnexa:  Non-palpable  Rectal Exam: Small external non-thrombosed hemorrhoid        Assessment:   Postpartum exam 6 wks s/p SVB Breastfeeding Hemorrhoid Depression screening Contraception counseling  Abnormal pap  Plan:   Gave printed info on increasing milk supply, constipation Rx anusol hc Contraception: continue POPs Follow up in: feb for pap & physical or earlier if needed  Tawnya Crook CNM, [facility] 01/20/2015 10:17 AM

## 2015-01-20 NOTE — Patient Instructions (Signed)
Tips To Increase Milk Supply  Lots of water! Enough so that your urine is clear  Plenty of calories, if you're not getting enough calories, your milk supply can decrease  Breastfeed/pump often, every 2-3 hours x 20-30mins  Fenugreek 3 pills 3 times a day, this may make your urine smell like maple syrup  Mother's Milk Tea  Lactation cookies, google for the recipe  Real oatmeal    Constipation  Drink plenty of fluid, preferably water, throughout the day  Eat foods high in fiber such as fruits, vegetables, and grains  Exercise, such as walking, is a good way to keep your bowels regular  Drink warm fluids, especially warm prune juice, or decaf coffee  Eat a 1/2 cup of real oatmeal (not instant), 1/2 cup applesauce, and 1/2-1 cup warm prune juice every day  If needed, you may take Colace (docusate sodium) stool softener once or twice a day to help keep the stool soft. If you are pregnant, wait until you are out of your first trimester (12-14 weeks of pregnancy)  If you still are having problems with constipation, you may take Miralax once daily as needed to help keep your bowels regular.  If you are pregnant, wait until you are out of your first trimester (12-14 weeks of pregnancy)    

## 2015-06-01 ENCOUNTER — Encounter (HOSPITAL_COMMUNITY): Payer: Self-pay | Admitting: *Deleted

## 2015-06-01 ENCOUNTER — Emergency Department (HOSPITAL_COMMUNITY)
Admission: EM | Admit: 2015-06-01 | Discharge: 2015-06-01 | Disposition: A | Discharge status: Home / Self Care | Payer: 59 | Attending: Emergency Medicine | Admitting: Emergency Medicine

## 2015-06-01 ENCOUNTER — Emergency Department (HOSPITAL_COMMUNITY): Payer: 59

## 2015-06-01 DIAGNOSIS — S300XXA Contusion of lower back and pelvis, initial encounter: Secondary | ICD-10-CM | POA: Insufficient documentation

## 2015-06-01 DIAGNOSIS — Z87891 Personal history of nicotine dependence: Secondary | ICD-10-CM | POA: Insufficient documentation

## 2015-06-01 DIAGNOSIS — Z8639 Personal history of other endocrine, nutritional and metabolic disease: Secondary | ICD-10-CM | POA: Diagnosis not present

## 2015-06-01 DIAGNOSIS — Z3202 Encounter for pregnancy test, result negative: Secondary | ICD-10-CM | POA: Diagnosis not present

## 2015-06-01 DIAGNOSIS — Y9289 Other specified places as the place of occurrence of the external cause: Secondary | ICD-10-CM | POA: Diagnosis not present

## 2015-06-01 DIAGNOSIS — W108XXA Fall (on) (from) other stairs and steps, initial encounter: Secondary | ICD-10-CM | POA: Insufficient documentation

## 2015-06-01 DIAGNOSIS — Z8659 Personal history of other mental and behavioral disorders: Secondary | ICD-10-CM | POA: Diagnosis not present

## 2015-06-01 DIAGNOSIS — Y9389 Activity, other specified: Secondary | ICD-10-CM | POA: Diagnosis not present

## 2015-06-01 DIAGNOSIS — Z8679 Personal history of other diseases of the circulatory system: Secondary | ICD-10-CM | POA: Diagnosis not present

## 2015-06-01 DIAGNOSIS — S0990XA Unspecified injury of head, initial encounter: Secondary | ICD-10-CM | POA: Diagnosis present

## 2015-06-01 DIAGNOSIS — S0083XA Contusion of other part of head, initial encounter: Secondary | ICD-10-CM | POA: Insufficient documentation

## 2015-06-01 DIAGNOSIS — Y998 Other external cause status: Secondary | ICD-10-CM | POA: Insufficient documentation

## 2015-06-01 DIAGNOSIS — W19XXXA Unspecified fall, initial encounter: Secondary | ICD-10-CM

## 2015-06-01 LAB — POC URINE PREG, ED: PREG TEST UR: NEGATIVE

## 2015-06-01 IMAGING — DX DG LUMBAR SPINE COMPLETE 4+V
5 series · 5 of 5 positions shown · non-contrast
Comparison: None.

CLINICAL DATA: Fall on [REDACTED], pain.

EXAM:
LUMBAR SPINE - COMPLETE 4+ VIEW

[l-spine ap]
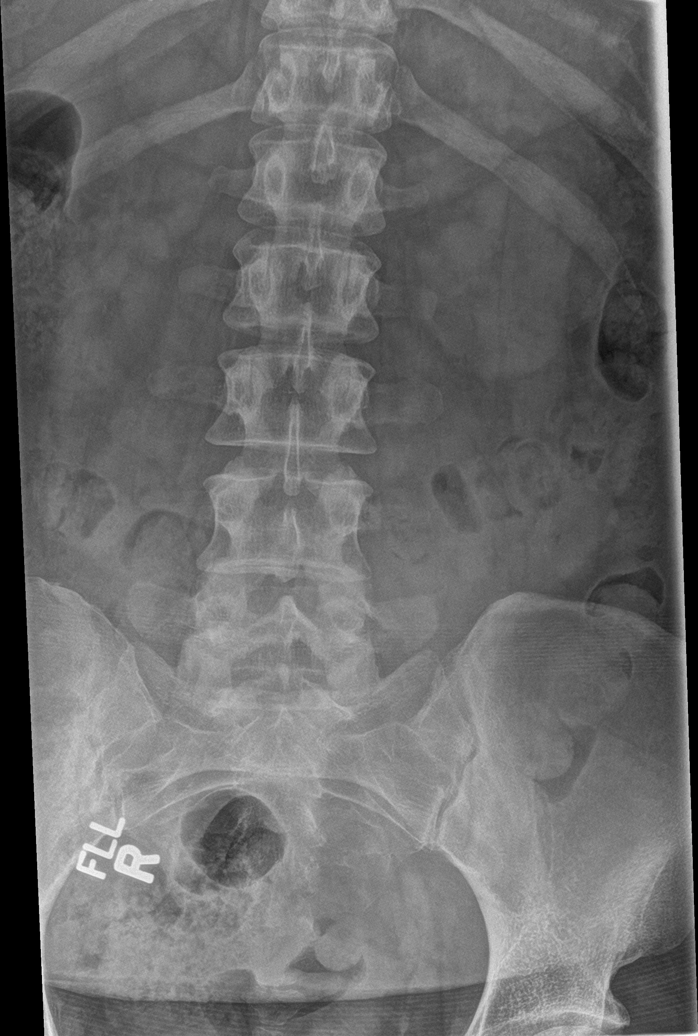

[l-spine obl (1 of 2)]
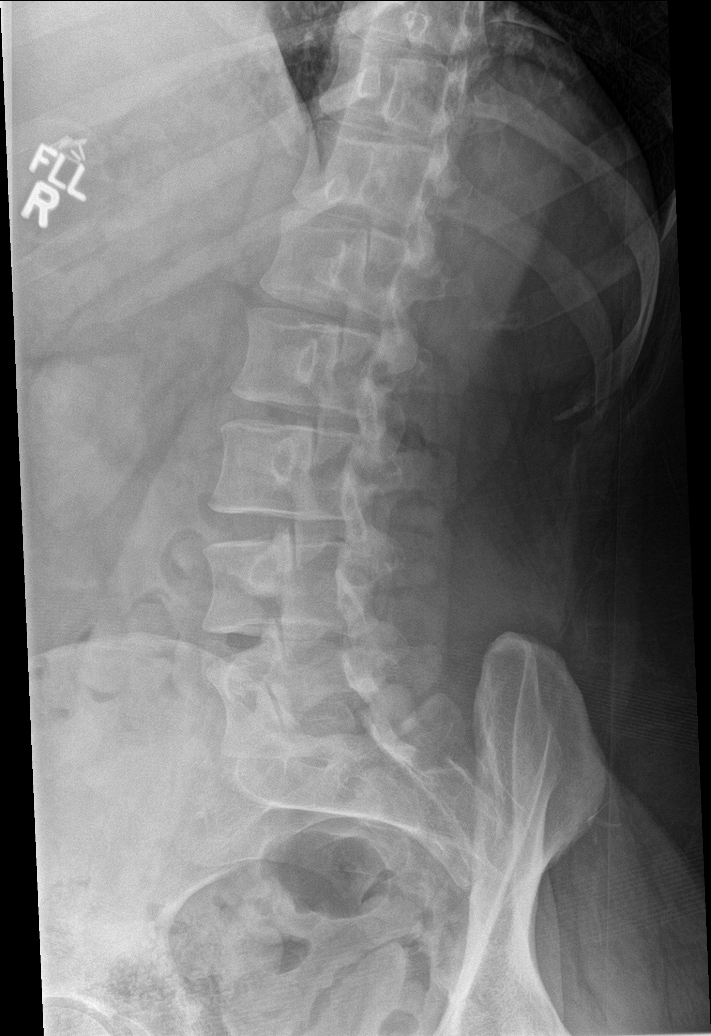

[l-spine obl (2 of 2)]
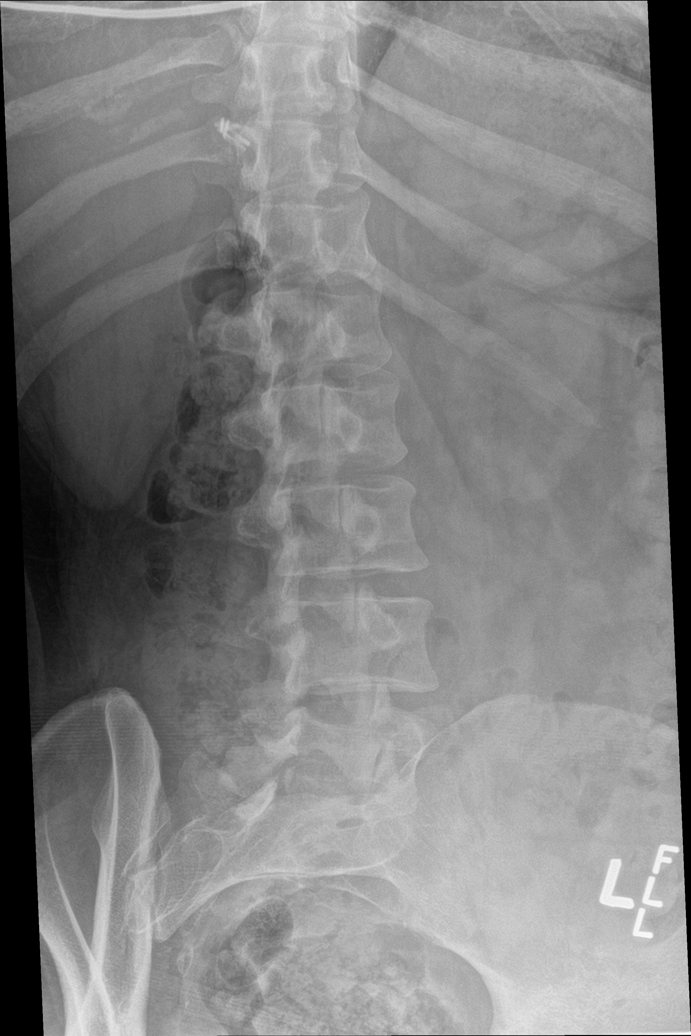

[l-spine lat]
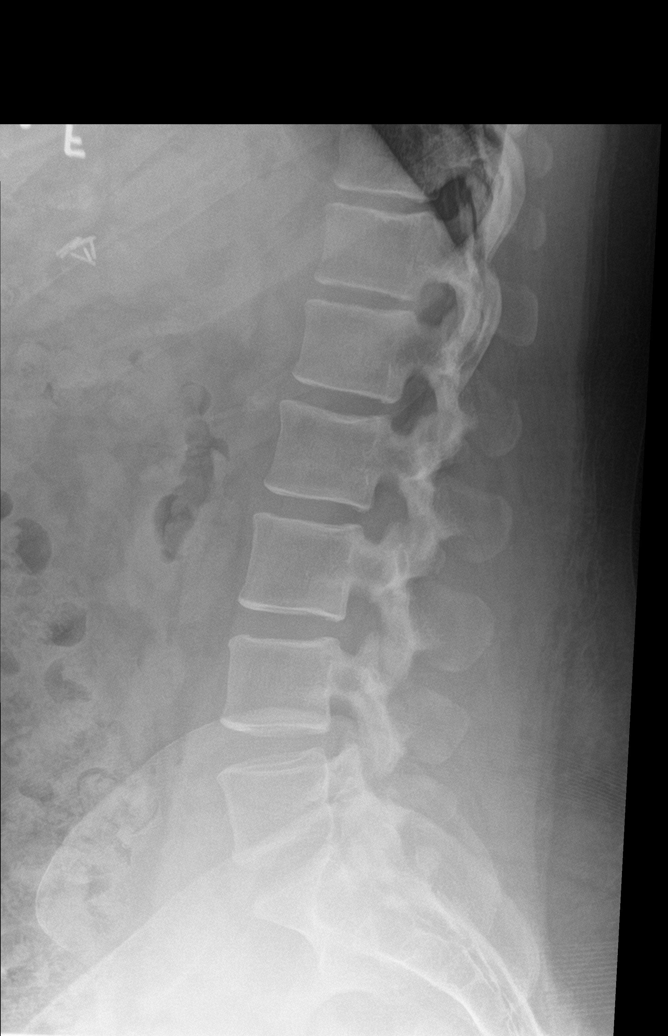

[l-spine spot]
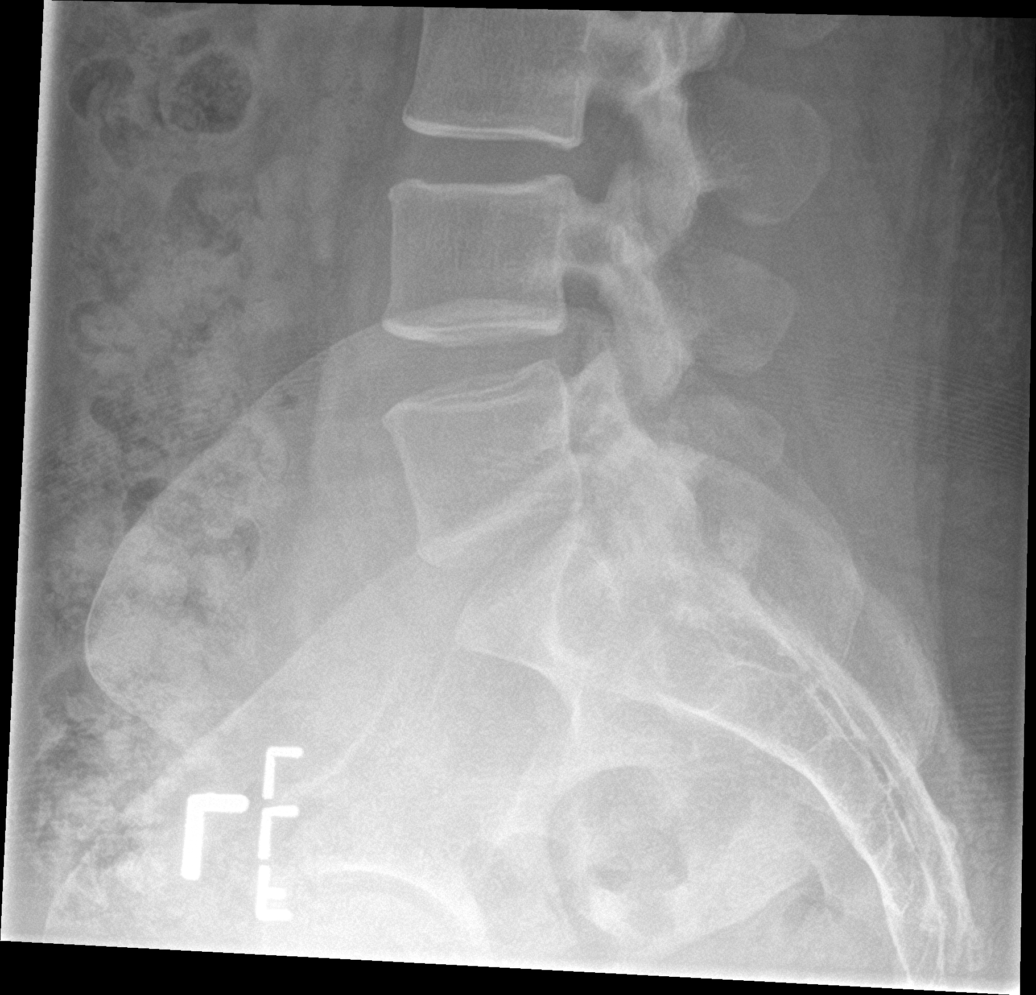

[5 of 5 positions shown; findings below may reference images not displayed]

FINDINGS: There is no evidence of lumbar spine fracture. Alignment is normal.
Intervertebral disc spaces are maintained.
IMPRESSION: Negative.

## 2015-06-01 IMAGING — CT CT HEAD W/O CM
1 of 2 series · 16 of 30 positions shown, 20 images · non-contrast
Comparison: [DATE]

CLINICAL DATA: PT FELL ON STEPS TODAY, HIT BACK OF HEAD ON STEPS,
DENIES LOC. PT ALSO FELL ON STEPS [DATE]

EXAM:
CT HEAD WITHOUT CONTRAST
TECHNIQUE: Contiguous axial images were obtained from the base of the skull
through the vertex without intravenous contrast.

[Series 3: headtrauma 2.4 h60s · axial · 0.43mm/px · z∈[+41,+196]mm · 16 of 72 slices shown, 20 images]
[im 4/72  brain]
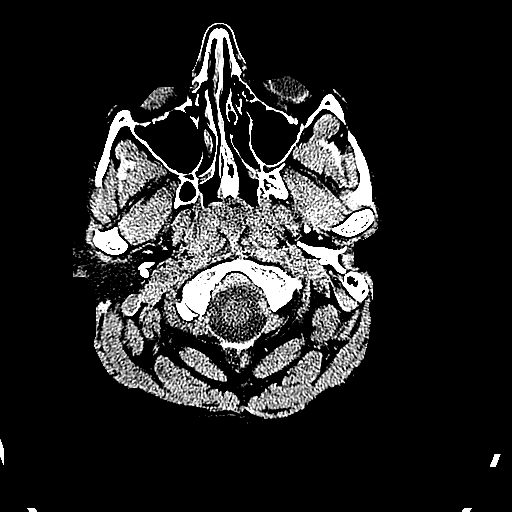
[im 4/72  bone]
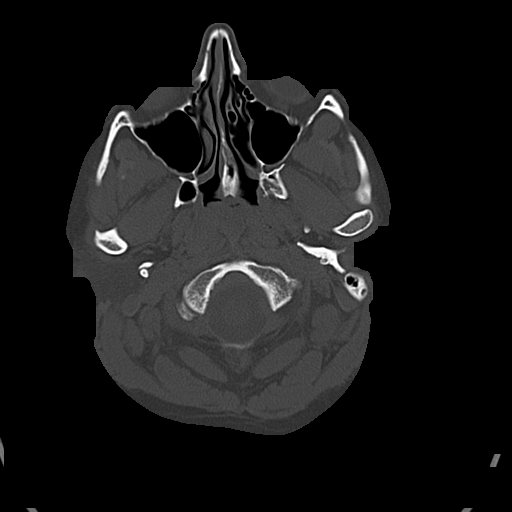
[im 8/72  brain]
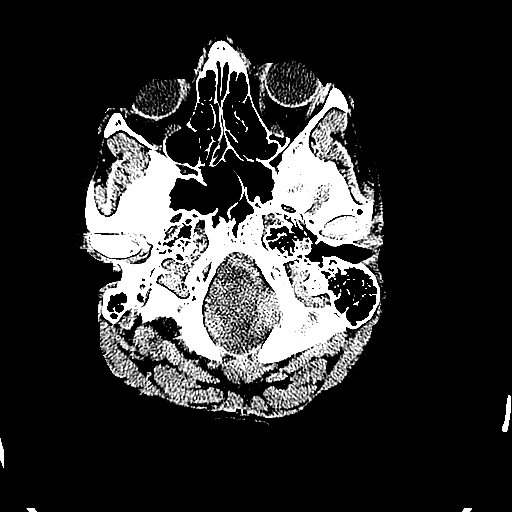
[im 12/72  brain]
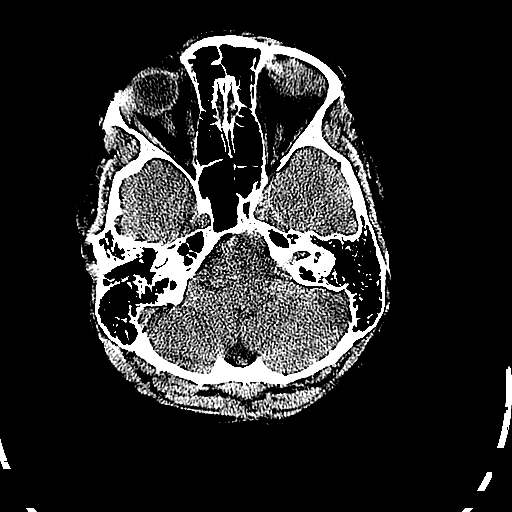
[im 15/72  brain]
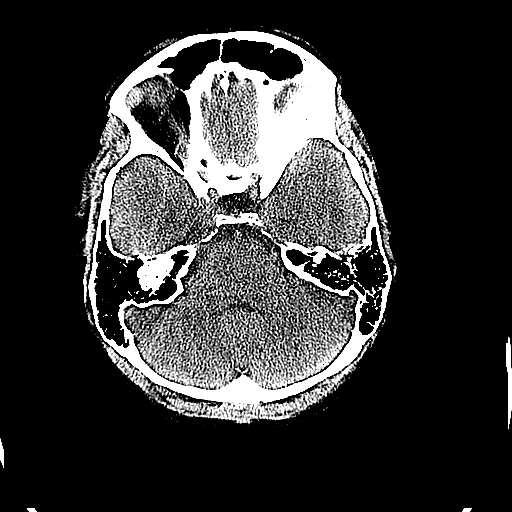
[im 23/72  brain]
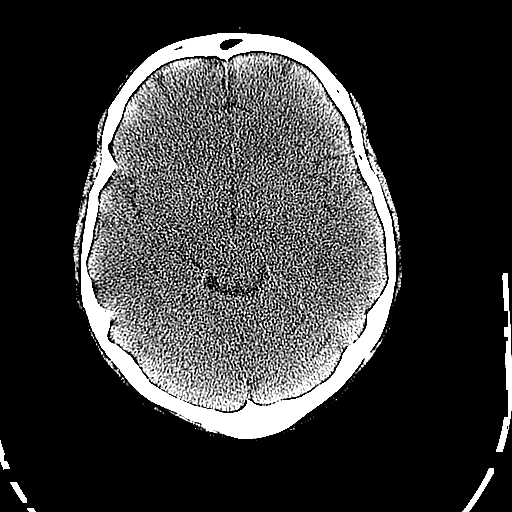
[im 23/72  bone]
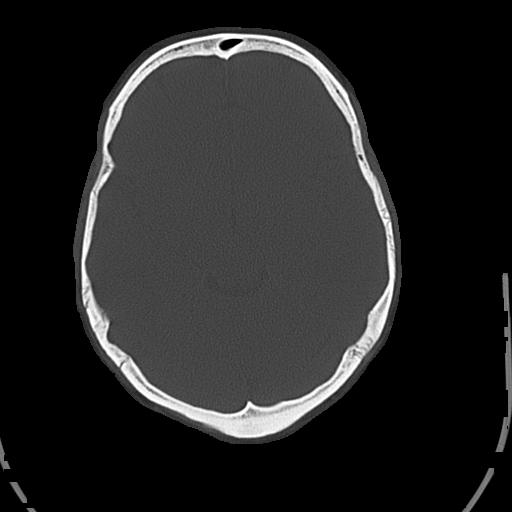
[im 27/72  brain]
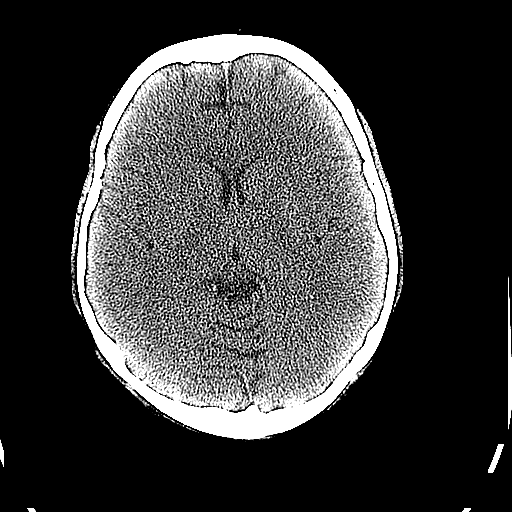
[im 30/72  brain]
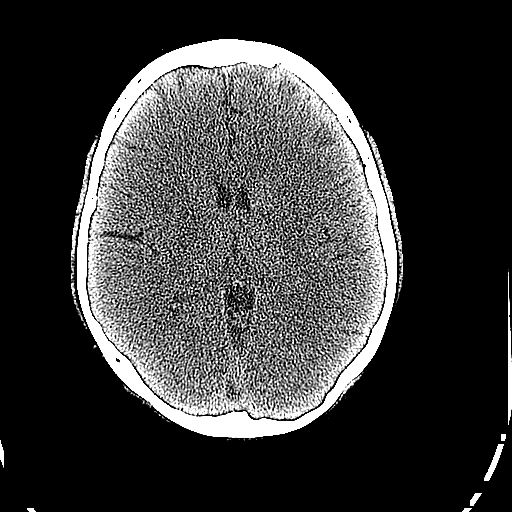
[im 34/72  brain]
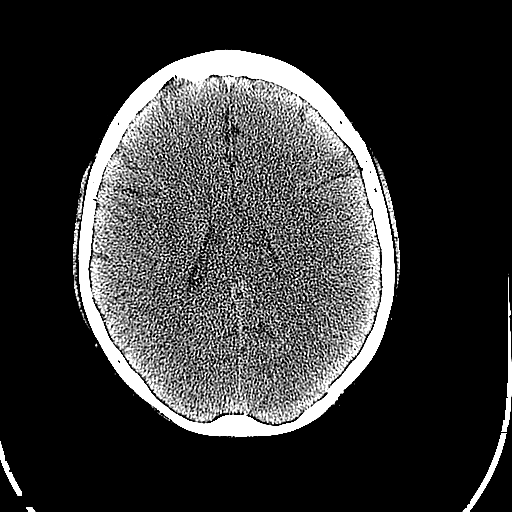
[im 38/72  brain]
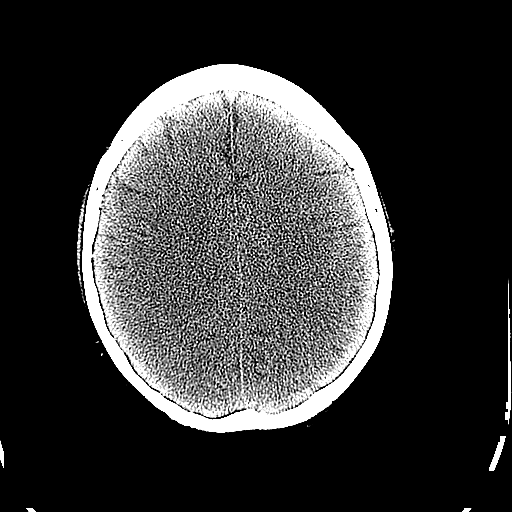
[im 38/72  bone]
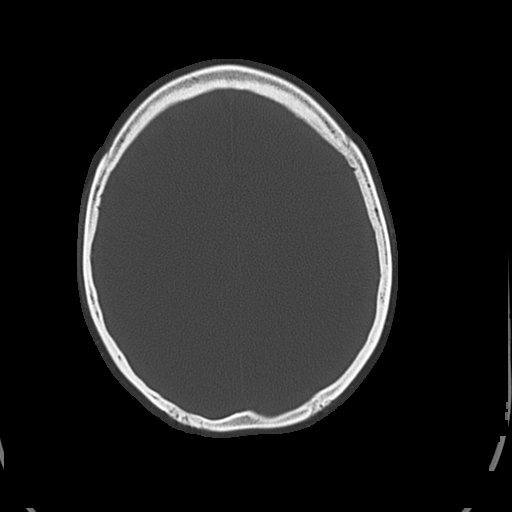
[im 42/72  brain]
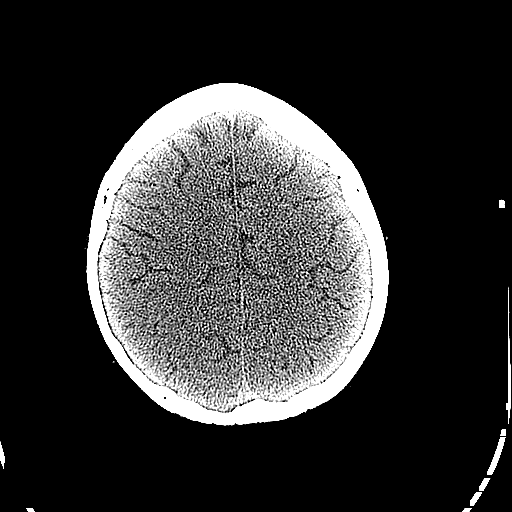
[im 45/72  brain]
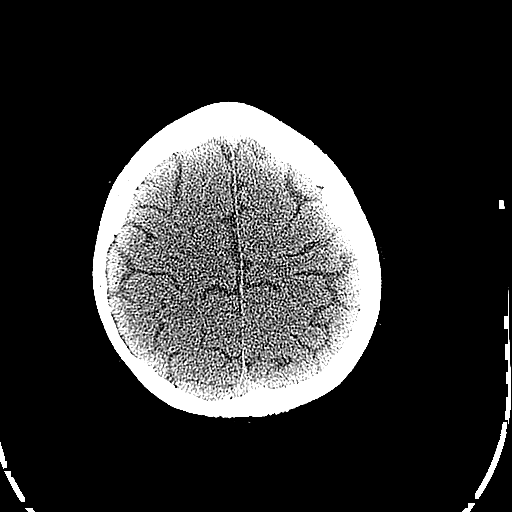
[im 49/72  brain]
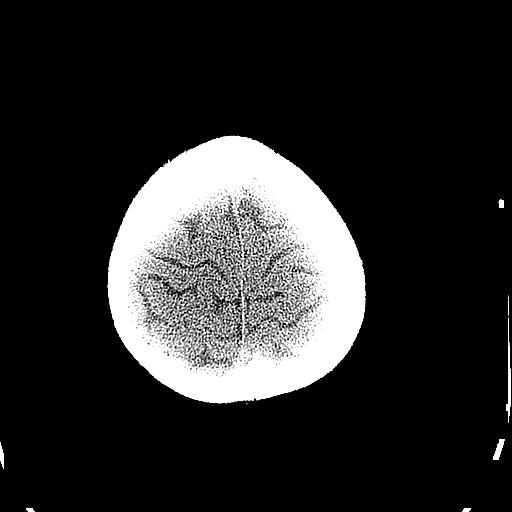
[im 57/72  brain]
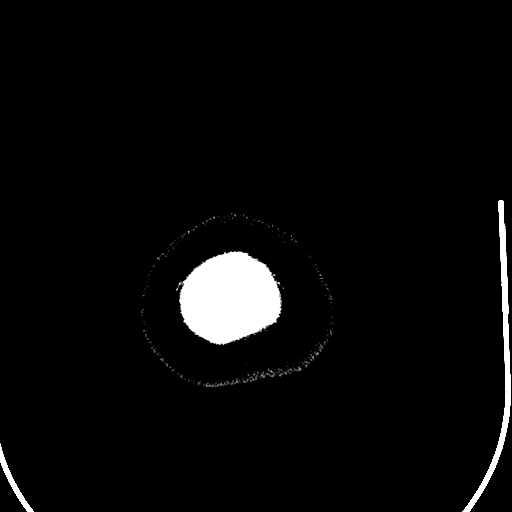
[im 57/72  bone]
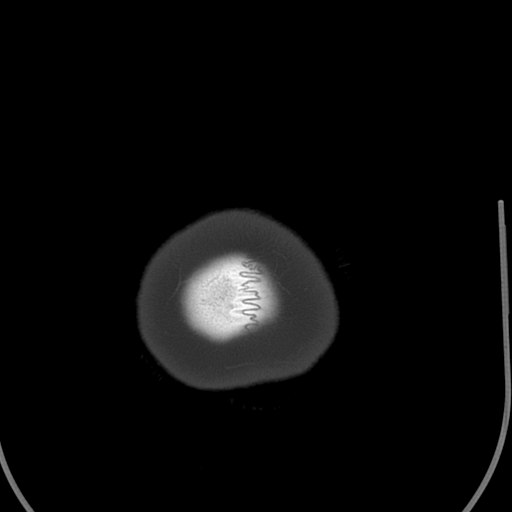
[im 60/72  brain]
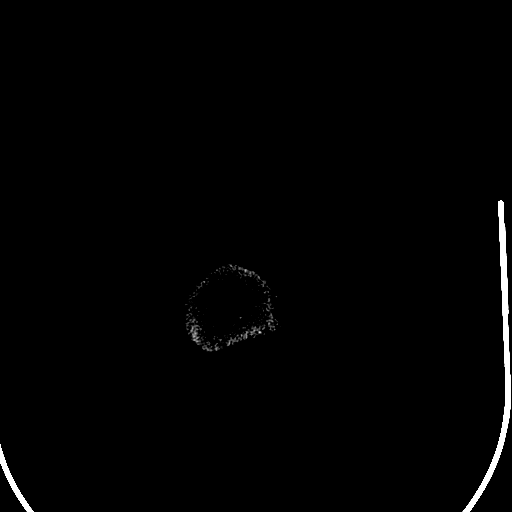
[im 64/72  brain]
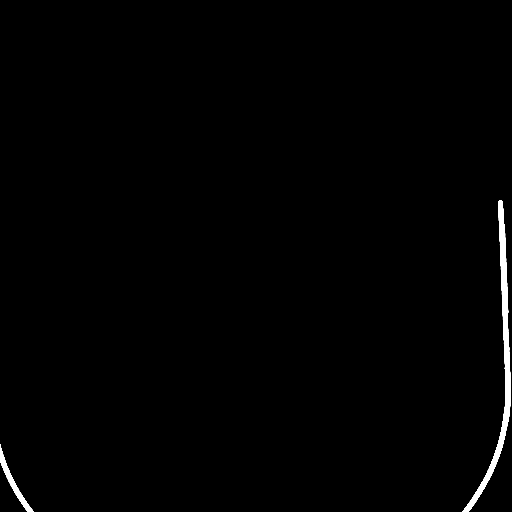
[im 68/72  brain]
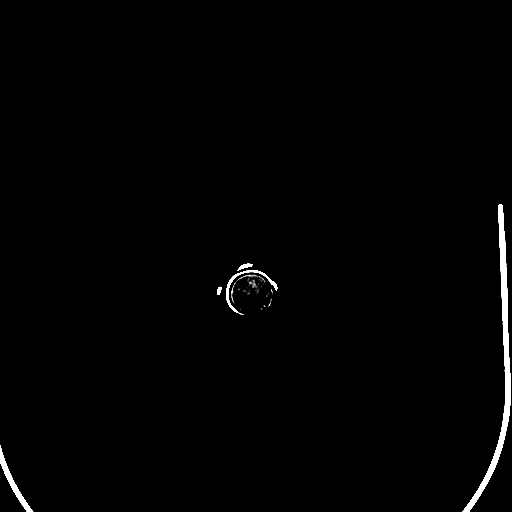

[16 of 30 positions shown; findings below may reference images not displayed]

FINDINGS: No mass lesion. No midline shift. No acute hemorrhage or hematoma.
No extra-axial fluid collections. No evidence of acute infarction.
No skull fracture.
IMPRESSION: Normal head CT

## 2015-06-01 IMAGING — DX DG CERVICAL SPINE COMPLETE 4+V
6 series · 6 of 6 positions shown · non-contrast
Comparison: [DATE]

CLINICAL DATA: Fall [REDACTED] down steps.  Hit head.

EXAM:
CERVICAL SPINE - COMPLETE 4+ VIEW

[c-spine lat]
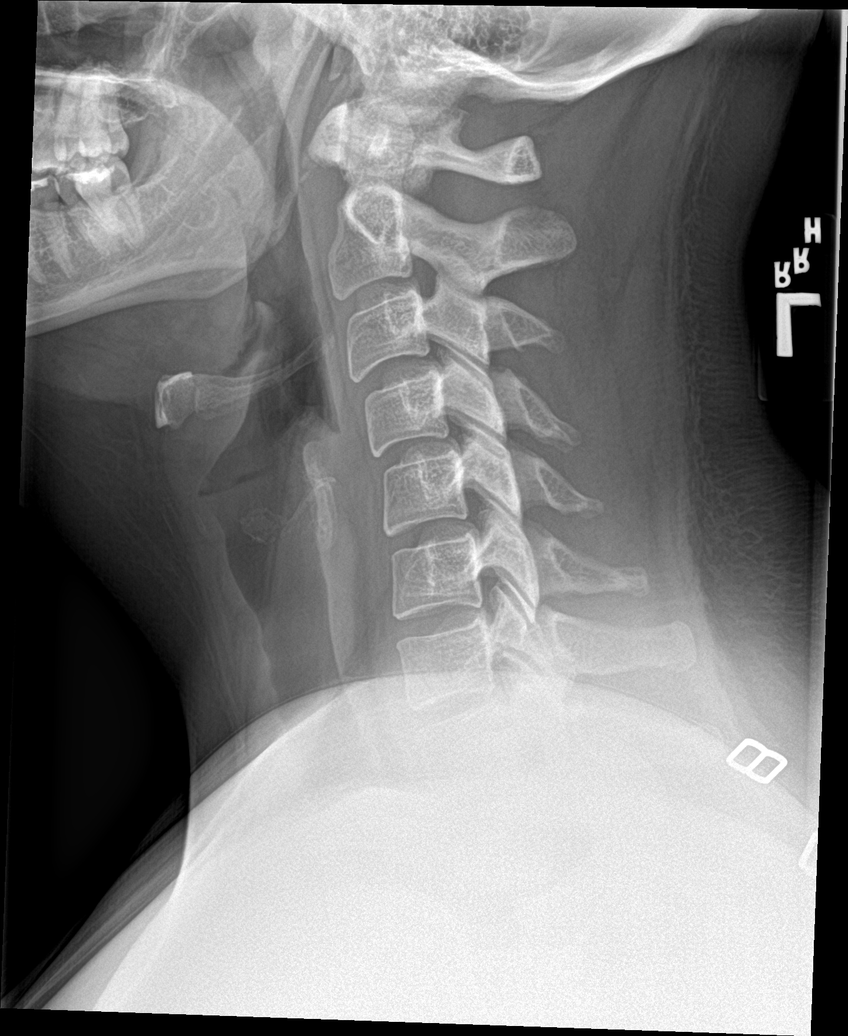

[c-spine obl (1 of 2)]
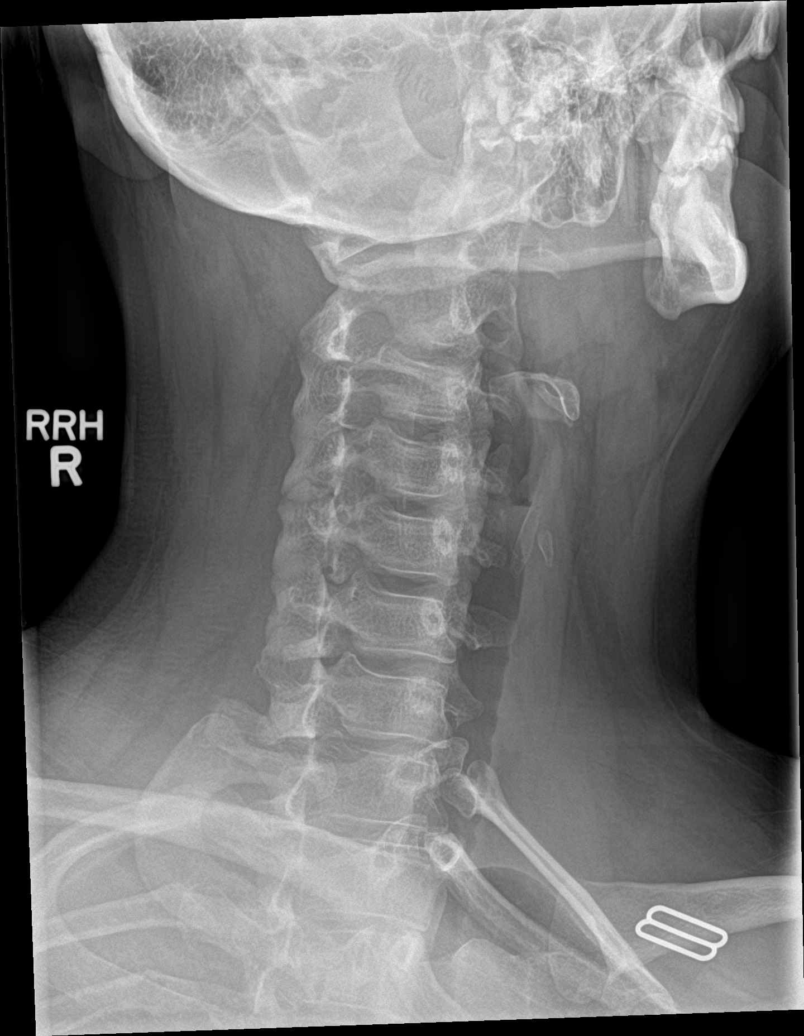

[c-spine obl (2 of 2)]
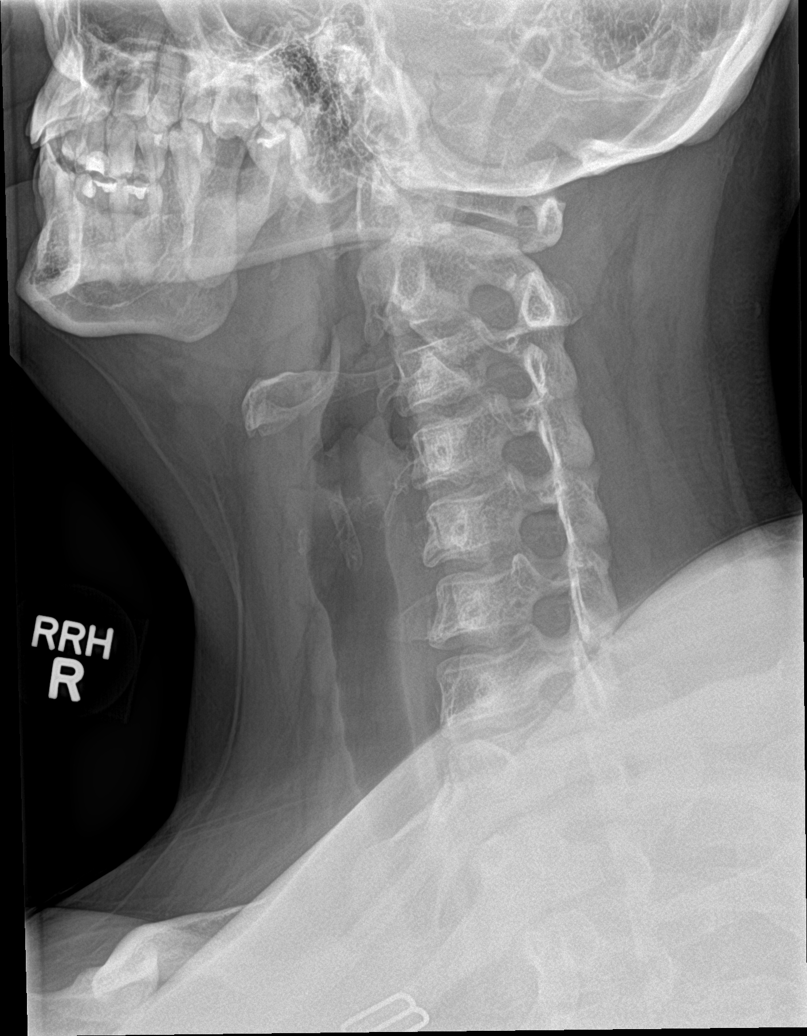

[c-spine ap]
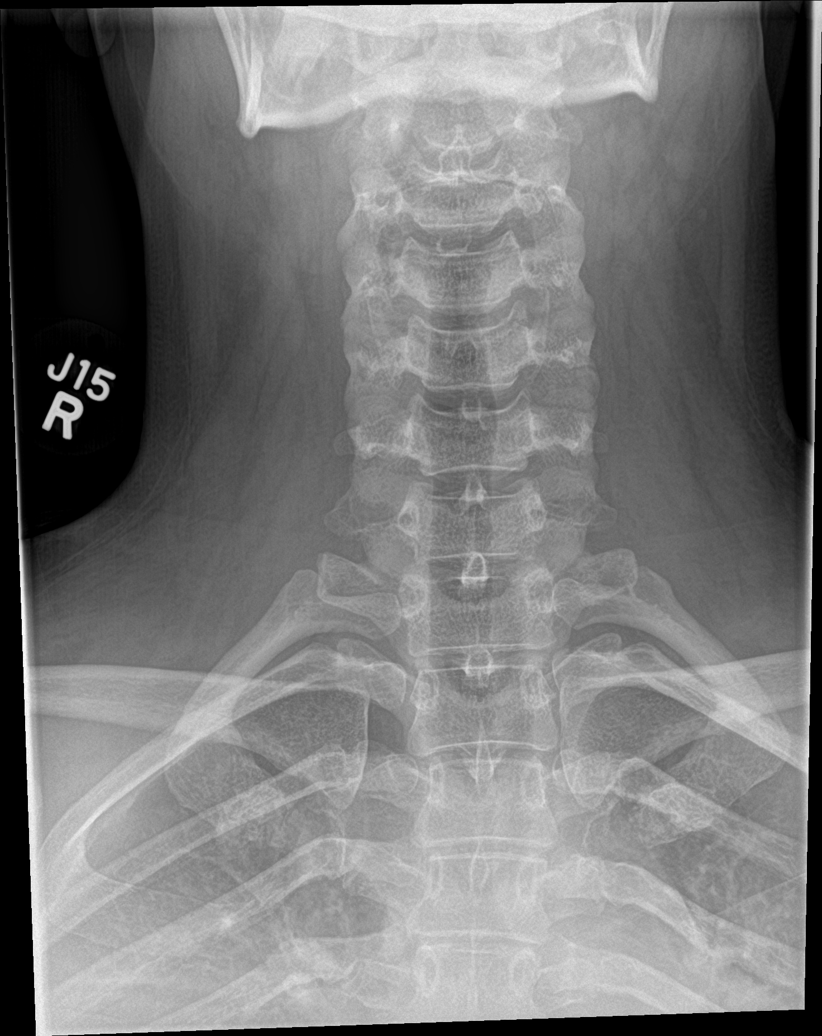

[c-spine open mouth]
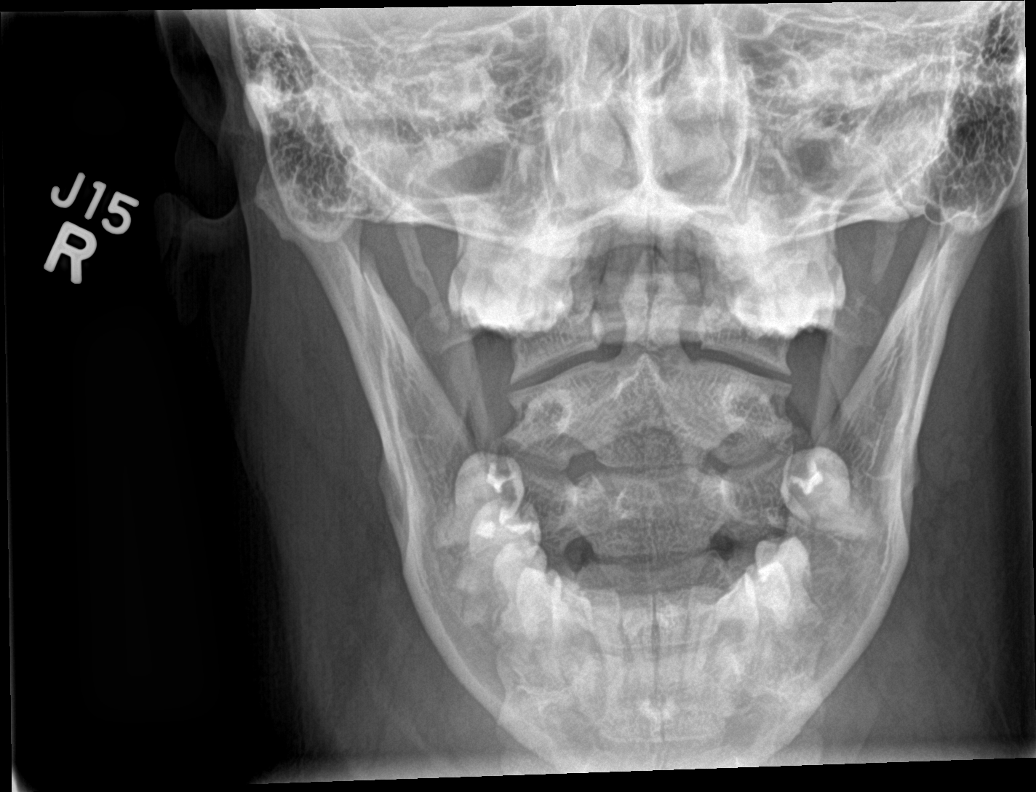

[[person_name]]
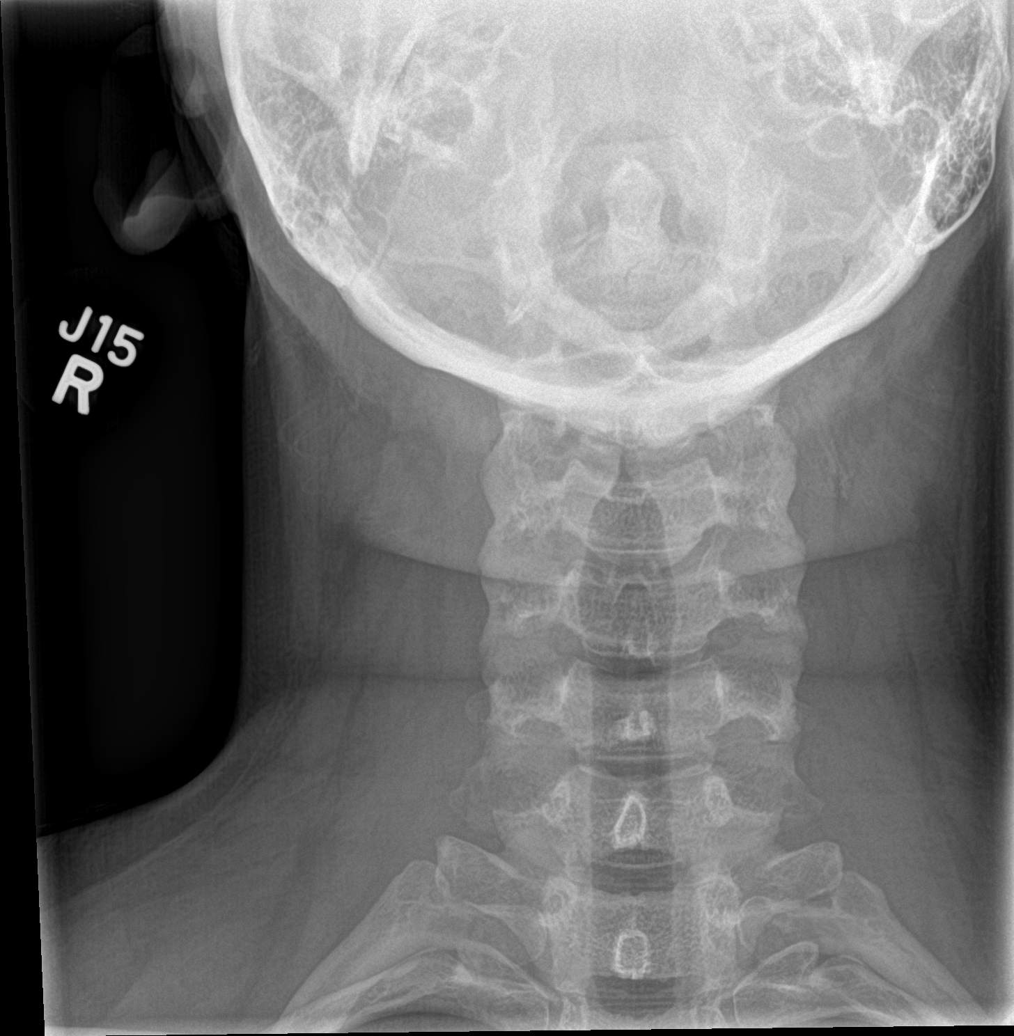

[6 of 6 positions shown; findings below may reference images not displayed]

FINDINGS: There is no evidence of cervical spine fracture or prevertebral soft
tissue swelling. Alignment is normal. No other significant bone
abnormalities are identified.
IMPRESSION: Negative cervical spine radiographs.

## 2015-06-01 NOTE — Discharge Instructions (Signed)
Tylenol or Motrin for pain follow-up with your doctor if any problems

## 2015-06-01 NOTE — ED Notes (Signed)
Pt reports falling today going down stairs. Reports hitting head on stairs. Denies LOC. Also c/o back pain. Also reports falling on stairs Saturday.

## 2015-06-01 NOTE — ED Provider Notes (Signed)
CSN: [id number]     Arrival date & time 06/01/15  1126 History   First MD Initiated Contact with Patient 06/01/15 1357     Chief Complaint  Patient presents with  . Fall     (Consider location/radiation/quality/duration/timing/severity/associated sxs/prior Treatment) Patient is a 25 y.o. female presenting with fall. The history is provided by the patient (Patient states that she fell down the steps couple days ago. She also fell and slipped in her son's room yesterday. She has low back pain and headache).  Fall This is a new problem. The current episode started yesterday. The problem occurs rarely. The problem has not changed since onset.Associated symptoms include headaches. Pertinent negatives include no chest pain and no abdominal pain. Exacerbated by: Movement. Nothing relieves the symptoms.    Past Medical History  Diagnosis Date  . Allergy   . Migraine headache   . Polycystic ovarian syndrome   . Pregnancy induced hypertension   . Vaginal Pap smear, abnormal   . Complication of anesthesia     woke up during surgery & ithing after surgery  . Anxiety   . Tachycardia    Past Surgical History  Procedure Laterality Date  . Cholecystectomy     Family History  Problem Relation Age of Onset  . Cancer Paternal Grandmother     ovarian cancer   Social History  Substance Use Topics  . Smoking status: Former Research scientist (life sciences)  . Smokeless tobacco: Never Used  . Alcohol Use: No     Comment: occasional   OB History    Gravida Para Term Preterm AB TAB SAB Ectopic Multiple Living   3 2 2  1  1   0 2      Obstetric Comments   IOL for pre-e     Review of Systems  Constitutional: Negative for appetite change and fatigue.  HENT: Negative for congestion, ear discharge and sinus pressure.   Eyes: Negative for discharge.  Respiratory: Negative for cough.   Cardiovascular: Negative for chest pain.  Gastrointestinal: Negative for abdominal pain and diarrhea.  Genitourinary: Negative for  frequency and hematuria.  Musculoskeletal: Positive for back pain.  Skin: Negative for rash.  Neurological: Positive for headaches. Negative for seizures.  Psychiatric/Behavioral: Negative for hallucinations.      Allergies  Other; Shellfish allergy; Adhesive; Eggs or egg-derived products; and Latex  Home Medications   Prior to Admission medications   Medication Sig Start Date End Date Taking? Authorizing Provider  ibuprofen (ADVIL,MOTRIN) 800 MG tablet Take 800 mg by mouth 2 (two) times daily as needed for mild pain or moderate pain.  05/30/15  Yes Historical Provider, MD  PROAIR HFA 108 (90 Base) MCG/ACT inhaler Inhale 1-2 puffs into the lungs every 6 (six) hours as needed for wheezing.  04/24/15  Yes Historical Provider, MD   BP 127/80 mmHg  Pulse 76  Temp(Src) 98.3 F (36.8 C) (Oral)  Resp 14  Ht 5' 6.5" (1.689 m)  Wt 220 lb (99.791 kg)  BMI 34.98 kg/m2  SpO2 100%  LMP 05/11/2015 Physical Exam  Constitutional: She is oriented to person, place, and time. She appears well-developed.  HENT:  Head: Normocephalic.  Mild tenderness to occipital head  Eyes: Conjunctivae and EOM are normal. No scleral icterus.  Neck: Neck supple. No thyromegaly present.  Cardiovascular: Normal rate and regular rhythm.  Exam reveals no gallop and no friction rub.   No murmur heard. Pulmonary/Chest: No stridor. She has no wheezes. She has no rales. She exhibits no tenderness.  Abdominal: She exhibits no distension. There is no tenderness. There is no rebound.  Musculoskeletal: Normal range of motion. She exhibits tenderness. She exhibits no edema.  Patient has tenderness to lumbar spine  Lymphadenopathy:    She has no cervical adenopathy.  Neurological: She is oriented to person, place, and time. She exhibits normal muscle tone. Coordination normal.  Skin: No rash noted. No erythema.  Psychiatric: She has a normal mood and affect. Her behavior is normal.    ED Course  Procedures (including  critical care time) Labs Review Labs Reviewed  POC URINE PREG, ED    Imaging Review Dg Cervical Spine Complete  06/01/2015  CLINICAL DATA:  Fall Saturday down steps.  Hit head. EXAM: CERVICAL SPINE - COMPLETE 4+ VIEW COMPARISON:  06/11/2004 FINDINGS: There is no evidence of cervical spine fracture or prevertebral soft tissue swelling. Alignment is normal. No other significant bone abnormalities are identified. IMPRESSION: Negative cervical spine radiographs. Electronically Signed   By: Rolm Baptise M.D.   On: 06/01/2015 13:14   Dg Lumbar Spine Complete  06/01/2015  CLINICAL DATA:  Fall on Saturday, pain. EXAM: LUMBAR SPINE - COMPLETE 4+ VIEW COMPARISON:  None. FINDINGS: There is no evidence of lumbar spine fracture. Alignment is normal. Intervertebral disc spaces are maintained. IMPRESSION: Negative. Electronically Signed   By: Franki Cabot M.D.   On: 06/01/2015 13:14   Ct Head Wo Contrast  06/01/2015  CLINICAL DATA:  PT FELL ON STEPS TODAY, HIT BACK OF HEAD ON STEPS, DENIES LOC. PT ALSO FELL ON STEPS 05/30/2015 EXAM: CT HEAD WITHOUT CONTRAST TECHNIQUE: Contiguous axial images were obtained from the base of the skull through the vertex without intravenous contrast. COMPARISON:  08/02/2006 FINDINGS: No mass lesion. No midline shift. No acute hemorrhage or hematoma. No extra-axial fluid collections. No evidence of acute infarction. No skull fracture. IMPRESSION: Normal head CT Electronically Signed   By: Skipper Cliche M.D.   On: 06/01/2015 14:40   I have personally reviewed and evaluated these images and lab results as part of my medical decision-making.   EKG Interpretation None      MDM   Final diagnoses:  Fall, initial encounter    Patient with fall contusion to head contusion and lumbar spine she's going to continue taking Motrin and Tylenol for pain and follow-up with her PCP    Milton Ferguson, MD 06/01/15 1559

## 2015-06-01 NOTE — Progress Notes (Signed)
Patient took glasses off face for CT Head scan, I was holding glasses by earpiece and glasses fell apart. Patient stated that her 51 month old son had broken her glasses and the office had glued them back together and used a rubber band. Kathi Der and I handed glasses and rubber piece to patient and she fixed glasses and placed back onto her face.

## 2015-06-17 ENCOUNTER — Other Ambulatory Visit: Payer: 59 | Admitting: Women's Health

## 2015-06-23 ENCOUNTER — Encounter: Payer: Self-pay | Admitting: Women's Health

## 2015-06-23 ENCOUNTER — Other Ambulatory Visit (HOSPITAL_COMMUNITY)
Admission: RE | Admit: 2015-06-23 | Discharge: 2015-06-23 | Disposition: A | Discharge status: Home / Self Care | Payer: 59 | Source: Ambulatory Visit | Attending: Obstetrics & Gynecology | Admitting: Obstetrics & Gynecology

## 2015-06-23 ENCOUNTER — Ambulatory Visit (INDEPENDENT_AMBULATORY_CARE_PROVIDER_SITE_OTHER): Payer: 59 | Admitting: Women's Health

## 2015-06-23 VITALS — BP 138/72 | HR 88 | Ht 66.5 in | Wt 226.0 lb

## 2015-06-23 DIAGNOSIS — K219 Gastro-esophageal reflux disease without esophagitis: Secondary | ICD-10-CM

## 2015-06-23 DIAGNOSIS — L732 Hidradenitis suppurativa: Secondary | ICD-10-CM | POA: Insufficient documentation

## 2015-06-23 DIAGNOSIS — Z113 Encounter for screening for infections with a predominantly sexual mode of transmission: Secondary | ICD-10-CM | POA: Insufficient documentation

## 2015-06-23 DIAGNOSIS — Z01419 Encounter for gynecological examination (general) (routine) without abnormal findings: Secondary | ICD-10-CM | POA: Diagnosis not present

## 2015-06-23 DIAGNOSIS — Z01411 Encounter for gynecological examination (general) (routine) with abnormal findings: Secondary | ICD-10-CM | POA: Diagnosis not present

## 2015-06-23 DIAGNOSIS — E669 Obesity, unspecified: Secondary | ICD-10-CM

## 2015-06-23 DIAGNOSIS — Z3009 Encounter for other general counseling and advice on contraception: Secondary | ICD-10-CM

## 2015-06-23 MED ORDER — OMEPRAZOLE 10 MG PO CPDR: 10.0000 mg | DELAYED_RELEASE_CAPSULE | Freq: Every day | ORAL | Status: DC

## 2015-06-23 NOTE — Patient Instructions (Addendum)
NO SEX UNTIL AFTER YOU GET YOUR BIRTH CONTROL   Hidradenitis . Loose, light clothing. Avoid heat, friction, shearing. Don't squeeze! . Wash clothes in perfume/dye free detergent . Gentle non-soap cleanser, wash gently w/ fingers (No cloth, loofah), can use antibacterial cleanser . Weight loss . Decrease/no dairy  Hidradenitis Suppurativa Hidradenitis suppurativa is a long-term (chronic) skin disease that starts with blocked sweat glands or hair follicles. Bacteria may grow in these blocked openings of your skin. Hidradenitis suppurativa is like a severe form of acne that develops in areas of your body where acne would be unusual. It is most likely to affect the areas of your body where skin rubs against skin and becomes moist. This includes your:  Underarms.  Groin.  Genital areas.  Buttocks.  Upper thighs.  Breasts. Hidradenitis suppurativa may start out with small pimples. The pimples can develop into deep sores that break open (rupture) and drain pus. Over time your skin may thicken and become scarred. Hidradenitis suppurativa cannot be passed from person to person.  CAUSES  The exact cause of hidradenitis suppurativa is not known. This condition may be due to:  Female and female hormones. The condition is rare before and after puberty.  An overactive body defense system (immune system). Your immune system may overreact to the blocked hair follicles or sweat glands and cause swelling and pus-filled sores. RISK FACTORS You may have a higher risk of hidradenitis suppurativa if you:  Are a woman.  Are between ages 51 and 53.  Have a family history of hidradenitis suppurativa.  Have a personal history of acne.  Are overweight.  Smoke.  Take the drug lithium. SIGNS AND SYMPTOMS  The first signs of an outbreak are usually painful skin bumps that look like pimples. As the condition progresses:  Skin bumps may get bigger and grow deeper into the skin.  Bumps under the skin  may rupture and drain smelly pus.  Skin may become itchy and infected.  Skin may thicken and scar.  Drainage may continue through tunnels under the skin (fistulas).  Walking and moving your arms can become painful. DIAGNOSIS  Your health care provider may diagnose hidradenitis suppurativa based on your medical history and your signs and symptoms. A physical exam will also be done. You may need to see a health care provider who specializes in skin diseases (dermatologist). You may also have tests done to confirm the diagnosis. These can include:  Swabbing a sample of pus or drainage from your skin so it can be sent to the lab and tested for infection.  Blood tests to check for infection. TREATMENT  The same treatment will not work for everybody with hidradenitis suppurativa. Your treatment will depend on how severe your symptoms are. You may need to try several treatments to find what works best for you. Part of your treatment may include cleaning and bandaging (dressing) your wounds. You may also have to take medicines, such as the following:  Antibiotics.  Acne medicines.  Medicines to block or suppress the immune system.  A diabetes medicine (metformin) is sometimes used to treat this condition.  For women, birth control pills can sometimes help relieve symptoms. You may need surgery if you have a severe case of hidradenitis suppurativa that does not respond to medicine. Surgery may involve:   Using a laser to clear the skin and remove hair follicles.  Opening and draining deep sores.  Removing the areas of skin that are diseased and scarred. HOME CARE INSTRUCTIONS  Learn as much as you can about your disease, and work closely with your health care providers.  Take medicines only as directed by your health care provider.  If you were prescribed an antibiotic medicine, finish it all even if you start to feel better.  If you are overweight, losing weight may be very helpful.  Try to reach and maintain a healthy weight.  Do not use any tobacco products, including cigarettes, chewing tobacco, or electronic cigarettes. If you need help quitting, ask your health care provider.  Do not shave the areas where you get hidradenitis suppurativa.  Do not wear deodorant.  Wear loose-fitting clothes.  Try not to overheat and get sweaty.  Take a daily bleach bath as directed by your health care provider.  Fill your bathtub halfway with water.  Pour in  cup of unscented household bleach.  Soak for 5-10 minutes.  Cover sore areas with a warm, clean washcloth (compress) for 5-10 minutes. SEEK MEDICAL CARE IF:   You have a flare-up of hidradenitis suppurativa.  You have chills or a fever.  You are having trouble controlling your symptoms at home.   This information is not intended to replace advice given to you by your health care provider. Make sure you discuss any questions you have with your health care provider.   Document Released: 11/24/2003 Document Revised: 05/02/2014 Document Reviewed: 07/12/2013 Elsevier Interactive Patient Education [employer].

## 2015-06-23 NOTE — Progress Notes (Signed)
Patient ID: Dana Mcpherson, female   DOB: 05-30-1990, 25 y.o.   MRN: [id number] Subjective:   Dana N Hildebrant is a 25 y.o. [phone] Caucasian female here for a routine well-woman exam.  Patient's last menstrual period was 05/11/2015.    Current complaints: boils on breasts- has been getting them since 25yo. Was on micronor, still breastfeeding 67 month old baby, stopped micronor ~2wks ago d/t headaches- wants to switch to something different until husband gets vasectomy- discussed options- wants nexplanon. Last sex 1-2wks ago. Reports GERD, had prilosec rx during pregnancy, requests again PCP: Eulogio Bear, Larene Pickett       Does not desire labs  Social History: Sexual: heterosexual Marital Status: engaged Living situation: w/ fiance and children Occupation: unemployed Tobacco/alcohol: no tobacco or etoh Illicit drugs: no history of illicit drug use  The following portions of the patient's history were reviewed and updated as appropriate: allergies, current medications, past family history, past medical history, past social history, past surgical history and problem list.  Past Medical History Past Medical History  Diagnosis Date  . Allergy   . Migraine headache   . Polycystic ovarian syndrome   . Pregnancy induced hypertension   . Vaginal Pap smear, abnormal   . Complication of anesthesia     woke up during surgery & ithing after surgery  . Anxiety   . Tachycardia     Past Surgical History Past Surgical History  Procedure Laterality Date  . Cholecystectomy      Gynecologic History [id number]  Patient's last menstrual period was 05/11/2015. Contraception: stopped micronor ~2 months ago d/t migraines Last Pap: 25yr ago. Results were: ASCUS w/ +HRHPV w/ normal colpo Last mammogram: never. Results were: n/a Last TCS: never  Obstetric History OB History  Gravida Para Term Preterm AB SAB TAB Ectopic Multiple Living  3 2 2  1 1    0 2    # Outcome Date GA Lbr Len/2nd Weight Sex Delivery  Anes PTL Lv  3 Term 12/07/14 [redacted]w[redacted]d 06:45 / 00:17 8 lb 14.9 oz (4.051 kg) M Vag-Spont EPI  Y  2 Term 07/24/08 [redacted]w[redacted]d  7 lb 7 oz (3.374 kg) M Vag-Spont EPI N Y  1 SAB             Obstetric Comments  IOL for pre-e    Current Medications Current Outpatient Prescriptions on File Prior to Visit  Medication Sig Dispense Refill  . ibuprofen (ADVIL,MOTRIN) 800 MG tablet Take 800 mg by mouth 2 (two) times daily as needed for mild pain or moderate pain. Reported on 06/23/2015    . PROAIR HFA 108 (90 Base) MCG/ACT inhaler Inhale 1-2 puffs into the lungs every 6 (six) hours as needed for wheezing.      No current facility-administered medications on file prior to visit.    Review of Systems Patient denies any headaches, blurred vision, shortness of breath, chest pain, abdominal pain, problems with bowel movements, urination, or intercourse.  Objective:  BP 138/72 mmHg  Pulse 88  Ht 5' 6.5" (1.689 m)  Wt 226 lb (102.513 kg)  BMI 35.94 kg/m2  LMP 05/11/2015  Breastfeeding? Yes Physical Exam  General:  Well developed, well nourished, no acute distress. She is alert and oriented x3. Skin:  Warm and dry Neck:  Midline trachea, no thyromegaly or nodules Cardiovascular: Regular rate and rhythm, no murmur heard Lungs:  Effort normal, all lung fields clear to auscultation bilaterally Breasts:  No dominant palpable mass, retraction, or nipple discharge. Multiple scars under  large pendulous breasts, few small soft red areas on bilateral breasts c/w hidradenitis- none look infected Abdomen:  Soft, non tender, no hepatosplenomegaly or masses Pelvic:  External genitalia is normal in appearance.  The vagina is normal in appearance. The cervix is bulbous, no CMT.  Thin prep pap is done w/ HR HPV cotesting. Uterus is felt to be normal size, shape, and contour.  No adnexal masses or tenderness noted. Extremities:  No swelling or varicosities noted Psych:  She has a normal mood and affect  Assessment:    Healthy well-woman exam Obesity Hidradenitis Contraception counseling GERD  Plan:  Gave printed info on hidradenitis and things to do to improve, if any areas begin to look infected let us know F/U 1wk for nexplanon insertion- per Amy can use office stock, or sooner if needed No sex until Nexplanon Prilosec rx for GERD Mammogram @25yo  or sooner if problems Colonoscopy @25yo  or sooner if problems  Tawnya Crook CNM, [facility] 06/23/2015 3:11 PM

## 2015-06-25 LAB — CYTOLOGY - PAP

## 2015-06-29 ENCOUNTER — Encounter: Payer: Self-pay | Admitting: Women's Health

## 2015-06-29 ENCOUNTER — Ambulatory Visit (INDEPENDENT_AMBULATORY_CARE_PROVIDER_SITE_OTHER): Payer: 59 | Admitting: Women's Health

## 2015-06-29 VITALS — BP 134/78 | HR 76 | Wt 231.0 lb

## 2015-06-29 DIAGNOSIS — L732 Hidradenitis suppurativa: Secondary | ICD-10-CM

## 2015-06-29 DIAGNOSIS — Z3202 Encounter for pregnancy test, result negative: Secondary | ICD-10-CM | POA: Diagnosis not present

## 2015-06-29 DIAGNOSIS — IMO0002 Reserved for concepts with insufficient information to code with codable children: Secondary | ICD-10-CM

## 2015-06-29 DIAGNOSIS — Z30017 Encounter for initial prescription of implantable subdermal contraceptive: Secondary | ICD-10-CM

## 2015-06-29 DIAGNOSIS — Z3049 Encounter for surveillance of other contraceptives: Secondary | ICD-10-CM

## 2015-06-29 DIAGNOSIS — K649 Unspecified hemorrhoids: Secondary | ICD-10-CM | POA: Insufficient documentation

## 2015-06-29 DIAGNOSIS — K5900 Constipation, unspecified: Secondary | ICD-10-CM

## 2015-06-29 LAB — POCT URINE PREGNANCY: Preg Test, Ur: NEGATIVE

## 2015-06-29 MED ORDER — CLINDAMYCIN HCL 300 MG PO CAPS: 300.0000 mg | ORAL_CAPSULE | Freq: Two times a day (BID) | ORAL | Status: DC

## 2015-06-29 NOTE — Patient Instructions (Addendum)
Constipation  Drink plenty of fluid, preferably water, throughout the day  Eat foods high in fiber such as fruits, vegetables, and grains  Exercise, such as walking, is a good way to keep your bowels regular  Drink warm fluids, especially warm prune juice, or decaf coffee  Eat a 1/2 cup of real oatmeal (not instant), 1/2 cup applesauce, and 1/2-1 cup warm prune juice every day  If needed, you may take Colace (docusate sodium) stool softener once or twice a day to help keep the stool soft. If you are pregnant, wait until you are out of your first trimester (12-14 weeks of pregnancy)  If you still are having problems with constipation, you may take Miralax once daily as needed to help keep your bowels regular.  If you are pregnant, wait until you are out of your first trimester (12-14 weeks of pregnancy)   Over the counter hemorrhoid medicines  Keep the area clean and dry.  You can remove the big bandage in 24 hours, and the small steri-strip bandage in 3-5 days.  A back up method, such as condoms, should be used for two weeks. You may have irregular vaginal bleeding for the first 6 months after the Nexplanon is placed, then the bleeding usually lightens and it is possible that you may not have any periods.  If you have any concerns, please give [place] a call.    Etonogestrel implant What is this medicine? ETONOGESTREL (et oh noe JES trel) is a contraceptive (birth control) device. It is used to prevent pregnancy. It can be used for up to 3 years. This medicine may be used for other purposes; ask your health care provider or pharmacist if you have questions. What should I tell my health care provider before I take this medicine? They need to know if you have any of these conditions: -abnormal vaginal bleeding -blood vessel disease or blood clots -cancer of the breast, cervix, or liver -depression -diabetes -gallbladder disease -headaches -heart disease or recent heart attack -high blood  pressure -high cholesterol -kidney disease -liver disease -renal disease -seizures -tobacco smoker -an unusual or allergic reaction to etonogestrel, other hormones, anesthetics or antiseptics, medicines, foods, dyes, or preservatives -pregnant or trying to get pregnant -breast-feeding How should I use this medicine? This device is inserted just under the skin on the inner side of your upper arm by a health care professional. Talk to your pediatrician regarding the use of this medicine in children. Special care may be needed. Overdosage: If you think you have taken too much of this medicine contact a poison control center or emergency room at once. NOTE: This medicine is only for you. Do not share this medicine with others. What if I miss a dose? This does not apply. What may interact with this medicine? Do not take this medicine with any of the following medications: -amprenavir -bosentan -fosamprenavir This medicine may also interact with the following medications: -barbiturate medicines for inducing sleep or treating seizures -certain medicines for fungal infections like ketoconazole and itraconazole -griseofulvin -medicines to treat seizures like carbamazepine, felbamate, oxcarbazepine, phenytoin, topiramate -modafinil -phenylbutazone -rifampin -some medicines to treat HIV infection like atazanavir, indinavir, lopinavir, nelfinavir, tipranavir, ritonavir -St. John's wort This list may not describe all possible interactions. Give your health care provider a list of all the medicines, herbs, non-prescription drugs, or dietary supplements you use. Also tell them if you smoke, drink alcohol, or use illegal drugs. Some items may interact with your medicine. What should I watch for  while using this medicine? This product does not protect you against HIV infection (AIDS) or other sexually transmitted diseases. You should be able to feel the implant by pressing your fingertips over the  skin where it was inserted. Contact your doctor if you cannot feel the implant, and use a non-hormonal birth control method (such as condoms) until your doctor confirms that the implant is in place. If you feel that the implant may have broken or become bent while in your arm, contact your healthcare provider. What side effects may I notice from receiving this medicine? Side effects that you should report to your doctor or health care professional as soon as possible: -allergic reactions like skin rash, itching or hives, swelling of the face, lips, or tongue -breast lumps -changes in emotions or moods -depressed mood -heavy or prolonged menstrual bleeding -pain, irritation, swelling, or bruising at the insertion site -scar at site of insertion -signs of infection at the insertion site such as fever, and skin redness, pain or discharge -signs of pregnancy -signs and symptoms of a blood clot such as breathing problems; changes in vision; chest pain; severe, sudden headache; pain, swelling, warmth in the leg; trouble speaking; sudden numbness or weakness of the face, arm or leg -signs and symptoms of liver injury like dark yellow or brown urine; general ill feeling or flu-like symptoms; light-colored stools; loss of appetite; nausea; right upper belly pain; unusually weak or tired; yellowing of the eyes or skin -unusual vaginal bleeding, discharge -signs and symptoms of a stroke like changes in vision; confusion; trouble speaking or understanding; severe headaches; sudden numbness or weakness of the face, arm or leg; trouble walking; dizziness; loss of balance or coordination Side effects that usually do not require medical attention (Report these to your doctor or health care professional if they continue or are bothersome.): -acne -back pain -breast pain -changes in weight -dizziness -general ill feeling or flu-like symptoms -headache -irregular menstrual bleeding -nausea -sore  throat -vaginal irritation or inflammation This list may not describe all possible side effects. Call your doctor for medical advice about side effects. You may report side effects to FDA at 1-800-FDA-1088. Where should I keep my medicine? This drug is given in a hospital or clinic and will not be stored at home. NOTE: This sheet is a summary. It may not cover all possible information. If you have questions about this medicine, talk to your doctor, pharmacist, or health care provider.    2016, Elsevier/Gold Standard. (2014-01-24 14:07:06)

## 2015-06-29 NOTE — Progress Notes (Signed)
Patient ID: Dana Mcpherson, female   DOB: [birth year], 25 y.o.   MRN: [id number] Dana N Vlcek is a 25 y.o. year old Caucasian female here for Nexplanon insertion.  Patient's last menstrual period was 05/11/2015., last sexual intercourse was 4wks ago, and her pregnancy test today was negative.  Risks/benefits/side effects of Nexplanon have been discussed and her questions have been answered.  Specifically, a failure rate of 04/998 has been reported, with an increased failure rate if pt takes Bishop and/or antiseizure medicaitons.  Dana N Crabbe is aware of the common side effect of irregular bleeding, which the incidence of decreases over time.  Boils on breast appear the same as last week, hidradenitis, will go ahead and rx antibiotic- clindamycin 300mg  BID x 7d. She is breastfeeding.  Reports hemorrhoids w/ bleeding w/ bm's. No pain. Has been constipated lately. Gave printed info on constipation prevention/relief, to use otc hemorrhoid meds- if not improving let us know.   BP 134/78 mmHg  Pulse 76  Wt 231 lb (104.781 kg)  LMP 05/11/2015  Breastfeeding? Yes  Results for orders placed or performed in visit on 06/29/15 (from the past 24 hour(s))  POCT urine pregnancy   Collection Time: 06/29/15  1:54 PM  Result Value Ref Range   Preg Test, Ur Negative Negative     She is right-handed, so her left arm, approximately 4 inches proximal from the elbow, was cleansed with alcohol and anesthetized with 2cc of 2% Lidocaine.  The area was cleansed again with betadine and the Nexplanon was inserted per manufacturer's recommendations without difficulty.  A steri-strip and pressure bandage were applied.  Pt was instructed to keep the area clean and dry, remove pressure bandage in 24 hours, and keep insertion site covered with the steri-strip for 3-5 days.  Back up contraception was recommended for 2 weeks.  She was given a card indicating date Nexplanon was inserted and date it needs to be removed.  Follow-up PRN problems.  Tawnya Crook CNM, [facility] 06/29/2015 2:38 PM

## 2015-08-23 ENCOUNTER — Emergency Department (HOSPITAL_COMMUNITY)
Admission: EM | Admit: 2015-08-23 | Discharge: 2015-08-23 | Disposition: A | Discharge status: Home / Self Care | Payer: 59 | Attending: Emergency Medicine | Admitting: Emergency Medicine

## 2015-08-23 ENCOUNTER — Encounter (HOSPITAL_COMMUNITY): Payer: Self-pay | Admitting: Emergency Medicine

## 2015-08-23 ENCOUNTER — Emergency Department (HOSPITAL_COMMUNITY): Payer: 59

## 2015-08-23 DIAGNOSIS — Z87891 Personal history of nicotine dependence: Secondary | ICD-10-CM | POA: Diagnosis not present

## 2015-08-23 DIAGNOSIS — R05 Cough: Secondary | ICD-10-CM | POA: Diagnosis present

## 2015-08-23 DIAGNOSIS — J40 Bronchitis, not specified as acute or chronic: Secondary | ICD-10-CM | POA: Insufficient documentation

## 2015-08-23 DIAGNOSIS — J018 Other acute sinusitis: Secondary | ICD-10-CM | POA: Diagnosis not present

## 2015-08-23 MED ORDER — OXYMETAZOLINE HCL 0.05 % NA SOLN
2.0000 | Freq: Once | NASAL | Status: AC
  Administered 2015-08-23: 2 via NASAL
  Filled 2015-08-23: qty 15

## 2015-08-23 NOTE — ED Provider Notes (Signed)
CSN: [id number]     Arrival date & time 08/23/15  1422 History   By signing my name below, I, Dana Mcpherson, attest that this documentation has been prepared under the direction and in the presence of Lily Kocher, PA-C. Electronically Signed: Randa Mcpherson, ED Scribe. 08/23/2015. 3:17 PM.    Chief Complaint  Patient presents with  . Cough   Patient is a 25 y.o. female presenting with cough. The history is provided by the patient. No language interpreter was used.  Cough Cough characteristics:  Productive Sputum characteristics:  Green Severity:  Mild Onset quality:  Gradual Duration:  1 week Relieved by:  Nothing Worsened by:  Deep breathing Ineffective treatments: azithromycin  Associated symptoms: ear pain, eye discharge, headaches, sinus congestion and sore throat   Associated symptoms: no fever    HPI Comments: [place] N Boss is a 25 y.o. female who presents to the Emergency Department complaining of productive cough of green sputum onset 6 days prior. She reports associated HA, eye discharge, sinus pressure, sore throat, ear pain and congestion. Pt states that intermittently she coughs so much that she feels as if she is SOB. Pt states that she was evaluetd for the same cough by her PCP earlier this week. Pt states she was given azithromycin with no relief. Pt denies fever. Pt does report that she is breat feeding.    Past Medical History  Diagnosis Date  . Allergy   . Migraine headache   . Polycystic ovarian syndrome   . Pregnancy induced hypertension   . Vaginal Pap smear, abnormal   . Complication of anesthesia     woke up during surgery & ithing after surgery  . Anxiety   . Tachycardia    Past Surgical History  Procedure Laterality Date  . Cholecystectomy     Family History  Problem Relation Age of Onset  . Cancer Paternal Grandmother     ovarian cancer  . Asthma Son   . ADD / ADHD Son    Social History  Substance Use Topics  . Smoking status: Former  Research scientist (life sciences)  . Smokeless tobacco: Never Used  . Alcohol Use: No     Comment: occasional   OB History    Gravida Para Term Preterm AB TAB SAB Ectopic Multiple Living   3 2 2  1  1   0 2      Obstetric Comments   IOL for pre-e      Review of Systems  Constitutional: Negative for fever.  HENT: Positive for congestion, ear pain, sinus pressure and sore throat.   Eyes: Positive for discharge.  Respiratory: Positive for cough.   Neurological: Positive for headaches.  All other systems reviewed and are negative.    Allergies  Other; Shellfish allergy; Adhesive; Eggs or egg-derived products; and Latex  Home Medications   Prior to Admission medications   Medication Sig Start Date End Date Taking? Authorizing Provider  clindamycin (CLEOCIN) 300 MG capsule Take 1 capsule (300 mg total) by mouth 2 (two) times daily. X 7 days 06/29/15   Roma Schanz, CNM  ibuprofen (ADVIL,MOTRIN) 800 MG tablet Take 800 mg by mouth 2 (two) times daily as needed for mild pain or moderate pain. Reported on 06/23/2015 05/30/15   Historical Provider, MD  omeprazole (PRILOSEC) 10 MG capsule Take 1 capsule (10 mg total) by mouth daily. 06/23/15   Roma Schanz, CNM  PROAIR HFA 108 [phone] Base) MCG/ACT inhaler Inhale 1-2 puffs into the lungs every 6 (six)  hours as needed for wheezing. Reported on 06/29/2015 04/24/15   Historical Provider, MD   BP 114/67 mmHg  Pulse 98  Temp(Src) 98.4 F (36.9 C) (Tympanic)  Resp 16  Ht 5\' 6"  (1.676 m)  Wt 228 lb (103.42 kg)  BMI 36.82 kg/m2  SpO2 100%  LMP 08/23/2015   Physical Exam  Constitutional: She is oriented to person, place, and time. She appears well-developed and well-nourished. No distress.  HENT:  Head: Normocephalic and atraumatic.  Mouth/Throat: Uvula is midline and oropharynx is clear and moist.  Airway patent. Nasal congestion present.   Eyes: Conjunctivae and EOM are normal.  Neck: Neck supple. No tracheal deviation present.  Cardiovascular: Normal rate.    Pulmonary/Chest: Effort normal and breath sounds normal. No respiratory distress. She has no wheezes. She has no rales.  Musculoskeletal: Normal range of motion. She exhibits no edema.  Cap refill less than 2 seconds.   Neurological: She is alert and oriented to person, place, and time.  Skin: Skin is warm and dry.  Psychiatric: She has a normal mood and affect. Her behavior is normal.  Nursing note and vitals reviewed.   ED Course  Procedures (including critical care time) DIAGNOSTIC STUDIES: Oxygen Saturation is 100% on RA, normal by my interpretation.    COORDINATION OF CARE: 3:02 PM-Discussed treatment plan with pt at bedside and pt agreed to plan.     Labs Review Labs Reviewed - No data to display  Imaging Review No results found.    EKG Interpretation None      MDM  The examination favors acute sinusitis and bronchitis. The patient does not appear to be in acute distress at this time. Patient has a cough, but is able to speak in complete sentences. Pulse oximetry is 100% on room air. The patient will use Afrin spray, or Claritin-D. Patient will use Tylenol or ibuprofen for soreness. We discussed the need to increase fluids. And to follow with the primary physician if any changes, problems, or concerns.    Final diagnoses:  Other acute sinusitis  Bronchitis      **I have reviewed nursing notes, vital signs, and all appropriate lab and imaging results for this patient.Lily Kocher, PA-C 08/25/15 [id number]  Noemi Chapel, MD 08/27/15 [phone]

## 2015-08-23 NOTE — ED Notes (Signed)
Patient c/o productive cough with thick green sputum. Per patient sinus pressure and chest congestion. Patient seen by PCP and given azithromycin. Per patient taken antibiotic x4 days with no improvement. Patient states that cough is progressively getting worse. Patient reports having chills and sweats but unsure of fevers.

## 2015-08-23 NOTE — ED Notes (Signed)
Pt made aware to return if symptoms worsen or if any life threatening symptoms occur.   

## 2015-08-23 NOTE — Discharge Instructions (Signed)
Please use 2 squirts of afrin in each nostril three times daily for 5 days only. Increase fluids. Finish your antibiotic. See  Your MD for follow up and recheck. Upper Respiratory Infection, Adult Most upper respiratory infections (URIs) are caused by a virus. A URI affects the nose, throat, and upper air passages. The most common type of URI is often called "the common cold." HOME CARE   Take medicines only as told by your doctor.  Gargle warm saltwater or take cough drops to comfort your throat as told by your doctor.  Use a warm mist humidifier or inhale steam from a shower to increase air moisture. This may make it easier to breathe.  Drink enough fluid to keep your pee (urine) clear or pale yellow.  Eat soups and other clear broths.  Have a healthy diet.  Rest as needed.  Go back to work when your fever is gone or your doctor says it is okay.  You may need to stay home longer to avoid giving your URI to others.  You can also wear a face mask and wash your hands often to prevent spread of the virus.  Use your inhaler more if you have asthma.  Do not use any tobacco products, including cigarettes, chewing tobacco, or electronic cigarettes. If you need help quitting, ask your doctor. GET HELP IF:  You are getting worse, not better.  Your symptoms are not helped by medicine.  You have chills.  You are getting more short of breath.  You have brown or red mucus.  You have yellow or brown discharge from your nose.  You have pain in your face, especially when you bend forward.  You have a fever.  You have puffy (swollen) neck glands.  You have pain while swallowing.  You have white areas in the back of your throat. GET HELP RIGHT AWAY IF:   You have very bad or constant:  Headache.  Ear pain.  Pain in your forehead, behind your eyes, and over your cheekbones (sinus pain).  Chest pain.  You have long-lasting (chronic) lung disease and any of the  following:  Wheezing.  Long-lasting cough.  Coughing up blood.  A change in your usual mucus.  You have a stiff neck.  You have changes in your:  Vision.  Hearing.  Thinking.  Mood. MAKE SURE YOU:   Understand these instructions.  Will watch your condition.  Will get help right away if you are not doing well or get worse.   This information is not intended to replace advice given to you by your health care provider. Make sure you discuss any questions you have with your health care provider.   Document Released: 09/28/2007 Document Revised: 08/26/2014 Document Reviewed: 07/17/2013 Elsevier Interactive Patient Education [employer].

## 2015-10-01 ENCOUNTER — Telehealth: Payer: Self-pay | Admitting: Women's Health

## 2015-10-01 NOTE — Telephone Encounter (Signed)
Pt complains of headache and bleeding since getting nexplanon in March, appt mad for Monday 6/12 at 3:45 pm her choice

## 2015-10-01 NOTE — Telephone Encounter (Signed)
Pt c/o heavy vaginal bleeding, headaches, fatigue, "feels sick all the time" since implanon insertion in March. Please advise.

## 2015-10-05 ENCOUNTER — Encounter: Payer: Self-pay | Admitting: Adult Health

## 2015-10-05 ENCOUNTER — Ambulatory Visit (INDEPENDENT_AMBULATORY_CARE_PROVIDER_SITE_OTHER): Payer: 59 | Admitting: Adult Health

## 2015-10-05 VITALS — BP 136/68 | HR 98 | Ht 66.5 in | Wt 234.0 lb

## 2015-10-05 DIAGNOSIS — N938 Other specified abnormal uterine and vaginal bleeding: Secondary | ICD-10-CM | POA: Diagnosis not present

## 2015-10-05 DIAGNOSIS — Z975 Presence of (intrauterine) contraceptive device: Secondary | ICD-10-CM

## 2015-10-05 HISTORY — DX: Presence of (intrauterine) contraceptive device: Z97.5

## 2015-10-05 MED ORDER — MEGESTROL ACETATE 40 MG PO TABS: ORAL_TABLET | ORAL | Status: DC

## 2015-10-05 NOTE — Patient Instructions (Signed)
Take megace Follow up in 4 weeks

## 2015-10-05 NOTE — Progress Notes (Signed)
Subjective:     Patient ID: Dana Mcpherson, female   DOB: 09-Dec-1990, 25 y.o.   MRN: [id number]  HPI Dana is a 25 year old white female in complaining of bleeding since got nexplanon in March.And says if she lays on it it sticks at times.Has not had sex since got nexplanon.  Review of Systems Bleeding with nexplanon, nexplanon sticks at times  Reviewed past medical,surgical, social and family history. Reviewed medications and allergies.     Objective:   Physical Exam BP 136/68 mmHg  Pulse 98  Ht 5' 6.5" (1.689 m)  Wt 234 lb (106.142 kg)  BMI 37.21 kg/m2  LMP 06/29/2015  Breastfeeding? No,Skin warm and dry.Pelvic: external genitalia is normal in appearance no lesions, vagina: period like blood,urethra has no lesions or masses noted, cervix:smooth and bulbous, uterus: normal size, shape and contour, non tender, no masses felt, adnexa: no masses or tenderness noted. Bladder is non tender and no masses felt. Abdomen soft and non tender, nexplanon easily palpated left arm.Will try megace.But if she wants out will removal after follow up. GC/CHL obtained.     Assessment:     DUB Nexplanon in place     Plan:    GC/CHL sent Rx megace 40 mg #45 3 x 5 days then 2 x 5 days then 1 daily with 1 refill Follow up in 4 weeks

## 2015-10-06 LAB — GC/CHLAMYDIA PROBE AMP
CHLAMYDIA, DNA PROBE: NEGATIVE
NEISSERIA GONORRHOEAE BY PCR: NEGATIVE

## 2015-11-02 ENCOUNTER — Ambulatory Visit: Payer: 59 | Admitting: Adult Health

## 2016-02-17 ENCOUNTER — Encounter (HOSPITAL_COMMUNITY): Payer: Self-pay

## 2016-02-17 ENCOUNTER — Observation Stay (HOSPITAL_COMMUNITY)
Admission: EM | Admit: 2016-02-17 | Discharge: 2016-02-19 | Disposition: A | Discharge status: Home / Self Care | Payer: 59 | Attending: Family Medicine | Admitting: Family Medicine

## 2016-02-17 DIAGNOSIS — R42 Dizziness and giddiness: Secondary | ICD-10-CM | POA: Diagnosis present

## 2016-02-17 DIAGNOSIS — Z91040 Latex allergy status: Secondary | ICD-10-CM | POA: Diagnosis not present

## 2016-02-17 DIAGNOSIS — R200 Anesthesia of skin: Secondary | ICD-10-CM | POA: Insufficient documentation

## 2016-02-17 DIAGNOSIS — Z791 Long term (current) use of non-steroidal anti-inflammatories (NSAID): Secondary | ICD-10-CM | POA: Insufficient documentation

## 2016-02-17 DIAGNOSIS — R51 Headache: Secondary | ICD-10-CM | POA: Insufficient documentation

## 2016-02-17 DIAGNOSIS — R918 Other nonspecific abnormal finding of lung field: Secondary | ICD-10-CM | POA: Diagnosis not present

## 2016-02-17 DIAGNOSIS — Z87891 Personal history of nicotine dependence: Secondary | ICD-10-CM | POA: Insufficient documentation

## 2016-02-17 DIAGNOSIS — R112 Nausea with vomiting, unspecified: Secondary | ICD-10-CM | POA: Insufficient documentation

## 2016-02-17 DIAGNOSIS — R350 Frequency of micturition: Secondary | ICD-10-CM | POA: Insufficient documentation

## 2016-02-17 DIAGNOSIS — J189 Pneumonia, unspecified organism: Secondary | ICD-10-CM

## 2016-02-17 LAB — BASIC METABOLIC PANEL
ANION GAP: 9 (ref 5–15)
BUN: 7 mg/dL (ref 6–20)
CALCIUM: 9.3 mg/dL (ref 8.9–10.3)
CO2: 25 mmol/L (ref 22–32)
Chloride: 100 mmol/L — ABNORMAL LOW (ref 101–111)
Creatinine, Ser: 0.95 mg/dL (ref 0.44–1.00)
GLUCOSE: 94 mg/dL (ref 65–99)
Potassium: 4 mmol/L (ref 3.5–5.1)
SODIUM: 134 mmol/L — AB (ref 135–145)

## 2016-02-17 LAB — CBC WITH DIFFERENTIAL/PLATELET
BASOS ABS: 0 10*3/uL (ref 0.0–0.1)
BASOS PCT: 0 %
Eosinophils Absolute: 0 10*3/uL (ref 0.0–0.7)
Eosinophils Relative: 1 %
HEMATOCRIT: 41.9 % (ref 36.0–46.0)
Hemoglobin: 14.2 g/dL (ref 12.0–15.0)
Lymphocytes Relative: 14 %
Lymphs Abs: 1.1 10*3/uL (ref 0.7–4.0)
MCH: 28.7 pg (ref 26.0–34.0)
MCHC: 33.9 g/dL (ref 30.0–36.0)
MCV: 84.6 fL (ref 78.0–100.0)
MONO ABS: 0.7 10*3/uL (ref 0.1–1.0)
Monocytes Relative: 9 %
NEUTROS ABS: 6.2 10*3/uL (ref 1.7–7.7)
Neutrophils Relative %: 76 %
PLATELETS: 286 10*3/uL (ref 150–400)
RBC: 4.95 MIL/uL (ref 3.87–5.11)
RDW: 13.4 % (ref 11.5–15.5)
WBC: 8.1 10*3/uL (ref 4.0–10.5)

## 2016-02-17 MED ORDER — AMMONIA AROMATIC IN INHA
RESPIRATORY_TRACT | Status: AC
  Filled 2016-02-17: qty 10

## 2016-02-17 MED ORDER — SODIUM CHLORIDE 0.9 % IV BOLUS (SEPSIS)
1000.0000 mL | Freq: Once | INTRAVENOUS | Status: AC
  Administered 2016-02-17: 1000 mL via INTRAVENOUS

## 2016-02-17 NOTE — ED Notes (Signed)
Pt to dizzy to stand during orthostatics

## 2016-02-17 NOTE — ED Provider Notes (Signed)
[place] DEPT Provider Note   CSN: [id number] Arrival date & time: 02/17/16  2015 By signing my name below, I, Dana Mcpherson, attest that this documentation has been prepared under the direction and in the presence of Dana Bo, MD . Electronically Signed: Dyke Mcpherson, Scribe. 02/17/2016. 10:07 PM.   History   Chief Complaint Chief Complaint  Patient presents with  . Dizziness   HPI Dana Mcpherson is a 25 y.o. female with hx of anxiety who presents to the Emergency Department complaining of worsening dizziness onset yesterday. She states dizziness is exacerbated by position change. No alleviated factors noted. She notes associated facial numbness, decreased appetite, nausea, vomiting 1x,  headache, bilateral tinnitus, elevated heart rate, and increased urinary frequency. Pt reports a heartrate 166 bpm at home PTA. Per pt, she has chronic UTIs due to a short urethra. Pt had Nexplanon placed 02/17 and has irregular periods. She denies diarrhea or dysuria.   The history is provided by the patient. No language interpreter was used.   Past Medical History:  Diagnosis Date  . Allergy   . Anxiety   . Complication of anesthesia    woke up during surgery & ithing after surgery  . Migraine headache   . Nexplanon in place 10/05/2015   06/29/15 left arm  . Polycystic ovarian syndrome   . Pregnancy induced hypertension   . Tachycardia   . Vaginal Pap smear, abnormal     Patient Active Problem List   Diagnosis Date Noted  . Nexplanon in place 10/05/2015  . Hemorrhoid 06/29/2015  . Hydradenitis 06/23/2015  . Shortness of breath 11/04/2014  . Tachycardia 10/07/2014  . ASCUS with positive high risk HPV 05/28/2014  . Hx of preeclampsia, prior pregnancy, currently pregnant 04/22/2014  . PCOS (polycystic ovarian syndrome) 03/03/2014  . Hyperinsulinemia 03/03/2014  . Morbid obesity ([place]) 05/23/2013  . DUB (dysfunctional uterine bleeding) 05/23/2013    Past Surgical History:    Procedure Laterality Date  . CHOLECYSTECTOMY      OB History    Gravida Para Term Preterm AB Living   3 2 2   1 2    SAB TAB Ectopic Multiple Live Births   1     0 2      Obstetric Comments   IOL for pre-e       Home Medications    Prior to Admission medications   Medication Sig Start Date End Date Taking? Authorizing Provider  etonogestrel (NEXPLANON) 68 MG IMPL implant 1 each by Subdermal route once.   Yes Historical Provider, MD  ibuprofen (ADVIL,MOTRIN) 800 MG tablet Take 800 mg by mouth daily as needed for mild pain or moderate pain. Reported on 06/23/2015 05/30/15  Yes Historical Provider, MD  ondansetron (ZOFRAN) 4 MG tablet Take 4 mg by mouth every 8 (eight) hours as needed for nausea or vomiting.   Yes Historical Provider, MD  PROAIR HFA 108 (90 Base) MCG/ACT inhaler Inhale 1-2 puffs into the lungs every 6 (six) hours as needed for wheezing. Reported on 10/05/2015 04/24/15  Yes Historical Provider, MD    Family History Family History  Problem Relation Age of Onset  . Cancer Paternal Grandmother     ovarian cancer  . Asthma Son   . ADD / ADHD Son     Social History Social History  Substance Use Topics  . Smoking status: Former Research scientist (life sciences)  . Smokeless tobacco: Never Used  . Alcohol use No     Comment: occasional    Allergies  Other; Shellfish allergy; Adhesive [tape]; Eggs or egg-derived products; and Latex   Review of Systems Review of Systems  Constitutional: Positive for appetite change.  HENT: Positive for tinnitus.   Gastrointestinal: Positive for nausea and vomiting. Negative for diarrhea.  Genitourinary: Positive for frequency. Negative for dysuria.  Neurological: Positive for dizziness, numbness and headaches.  All other systems reviewed and are negative.  Physical Exam Updated Vital Signs BP 131/69 (BP Location: Right Arm)   Pulse 65   Temp 99.7 F (37.6 C) (Oral)   Resp 17   SpO2 99%   Physical Exam  Constitutional: She is oriented to  person, place, and time. She appears well-developed and well-nourished. No distress.  HENT:  Head: Normocephalic and atraumatic.  Eyes: Conjunctivae are normal.  Cardiovascular: Normal rate.   Pulmonary/Chest: Effort normal.  Abdominal: She exhibits no distension.  Neurological: She is alert and oriented to person, place, and time.  No pronator drift  Skin: Skin is warm and dry.  Psychiatric: She has a normal mood and affect.  Nursing note and vitals reviewed.  ED Treatments / Results  DIAGNOSTIC STUDIES:  Oxygen Saturation is 100% on RA, normal by my interpretation.    COORDINATION OF CARE:  10:08 PM Discussed treatment plan with pt at bedside and pt agreed to plan.  Labs (all labs ordered are listed, but only abnormal results are displayed) Labs Reviewed  BASIC METABOLIC PANEL - Abnormal; Notable for the following:       Result Value   Sodium 134 (*)    Chloride 100 (*)    All other components within normal limits  CBC WITH DIFFERENTIAL/PLATELET  URINALYSIS, ROUTINE W REFLEX MICROSCOPIC (NOT AT [facility])  PREGNANCY, URINE    EKG  EKG Interpretation  Date/Time:  Wednesday February 17 2016 22:23:56 EDT Ventricular Rate:  124 PR Interval:    QRS Duration: 91 QT Interval:  339 QTC Calculation: 487 R Axis:   43 Text Interpretation:  Sinus tachycardia Borderline T abnormalities, anterior leads Borderline prolonged QT interval since last tracing no significant change Confirmed by Eulis Foster  MD, Any Mcneice [id number]) on 02/17/2016 10:44:46 PM       Radiology No results found.  Procedures Procedures (including critical care time)  Medications Ordered in ED Medications  ammonia inhalant (not administered)  sodium chloride 0.9 % bolus 1,000 mL (1,000 mLs Intravenous New Bag/Given 02/17/16 2241)     Initial Impression / Assessment and Plan / ED Course  I have reviewed the triage vital signs and the nursing notes.  Pertinent labs & imaging results that were available during my  care of the patient were reviewed by me and considered in my medical decision making (see chart for details).  Clinical Course  Comment By Time  Orthostatic blood pressure and pulse supine, sitting negative. She was unable to stand, because of weakness. Dana Bo, MD 10/25 2348    Medications  ammonia inhalant (not administered)  sodium chloride 0.9 % bolus 1,000 mL (1,000 mLs Intravenous New Bag/Given 02/17/16 2241)    Patient Vitals for the past 24 hrs:  BP Temp Temp src Pulse Resp SpO2  02/17/16 2342 131/69 - - 65 17 99 %  02/17/16 2139 132/98 - - 72 16 100 %  02/17/16 2040 127/68 99.7 F (37.6 C) Oral 119 20 99 %    Additional IVF ordered, while awaiting U/A results. Care to Dr. Tomi Bamberger to evaluate after return of U/A and condider d/c.   Final Clinical Impressions(s) / ED Diagnoses  Final diagnoses:  Dizziness    Nonspecific dizziness, with reassuring findings on evaluation.  Nursing Notes Reviewed/ Care Coordinated, and agree without changes. Applicable Imaging Reviewed.  Interpretation of Laboratory Data incorporated into ED treatment    Plan- care to oncoming provider team to evaluate urinalysis, then consider discharge home.  New Prescriptions New Prescriptions   No medications on file  I personally performed the services described in this documentation, which was scribed in my presence. The recorded information has been reviewed and is accurate.     Dana Bo, MD 02/23/16 [phone]

## 2016-02-17 NOTE — ED Triage Notes (Signed)
Patient c/o right sided head pain with ringing in bilateral ears. Patient states dizziness with position change.

## 2016-02-17 NOTE — ED Notes (Signed)
Not able to get orthostatics at this time due to weakness and dizziness of Pt

## 2016-02-17 NOTE — ED Notes (Signed)
Pt not able to stand attempted to walk Pt to the restroom Pt got all sudden dizzy and almost fell over.

## 2016-02-18 ENCOUNTER — Emergency Department (HOSPITAL_COMMUNITY): Payer: 59

## 2016-02-18 ENCOUNTER — Encounter (HOSPITAL_COMMUNITY): Payer: Self-pay | Admitting: *Deleted

## 2016-02-18 DIAGNOSIS — J189 Pneumonia, unspecified organism: Secondary | ICD-10-CM | POA: Diagnosis not present

## 2016-02-18 DIAGNOSIS — R918 Other nonspecific abnormal finding of lung field: Secondary | ICD-10-CM | POA: Diagnosis present

## 2016-02-18 LAB — URINALYSIS, ROUTINE W REFLEX MICROSCOPIC
BILIRUBIN URINE: NEGATIVE
GLUCOSE, UA: NEGATIVE mg/dL
KETONES UR: NEGATIVE mg/dL
LEUKOCYTES UA: NEGATIVE
Nitrite: NEGATIVE
PROTEIN: NEGATIVE mg/dL
Specific Gravity, Urine: 1.015 (ref 1.005–1.030)
pH: 7 (ref 5.0–8.0)

## 2016-02-18 LAB — STREP PNEUMONIAE URINARY ANTIGEN: STREP PNEUMO URINARY ANTIGEN: NEGATIVE

## 2016-02-18 LAB — URINE MICROSCOPIC-ADD ON

## 2016-02-18 LAB — PREGNANCY, URINE: Preg Test, Ur: NEGATIVE

## 2016-02-18 IMAGING — CT CT ANGIO CHEST
2 of 6 series · 18 of 46 positions shown · IV contrast (APPLIED)
Comparison: Chest CT dated [DATE]

CLINICAL DATA: 25-year-old female with chest pain, shortness of
breath, and tachycardia.

EXAM:
CT ANGIOGRAPHY CHEST WITH CONTRAST
TECHNIQUE: Multidetector CT imaging of the chest was performed using the
standard protocol during bolus administration of intravenous
contrast. Multiplanar CT image reconstructions and MIPs were
obtained to evaluate the vascular anatomy.
CONTRAST:  100 cc Isovue 370

[Series 6: thins · axial · 0.98mm/px · z∈[-57,+139]mm · 15 of 216 slices shown]
[im 10/216  lung]
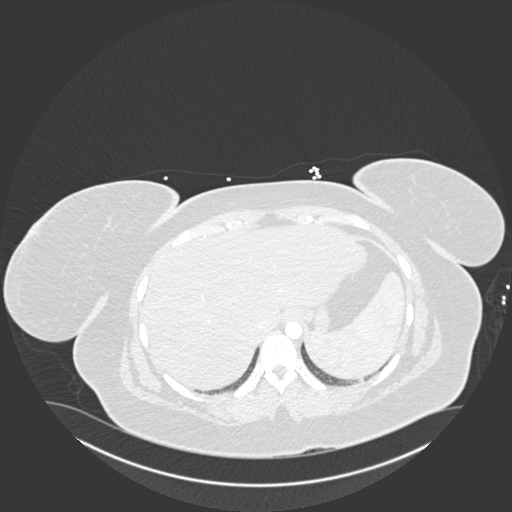
[im 29/216  soft-tissue]
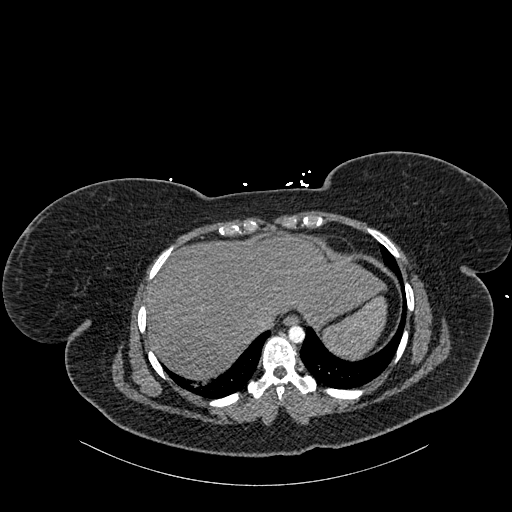
[im 38/216  lung]
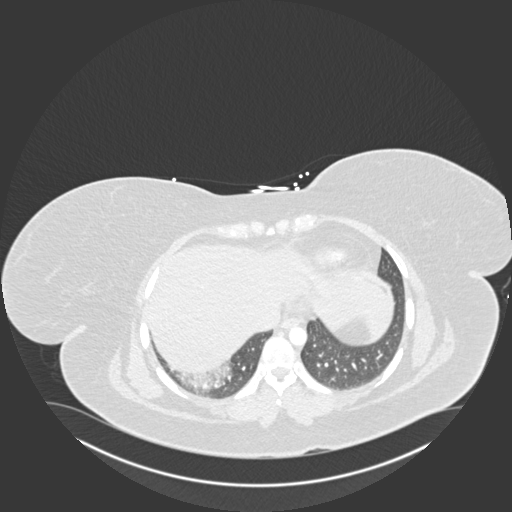
[im 57/216  soft-tissue]
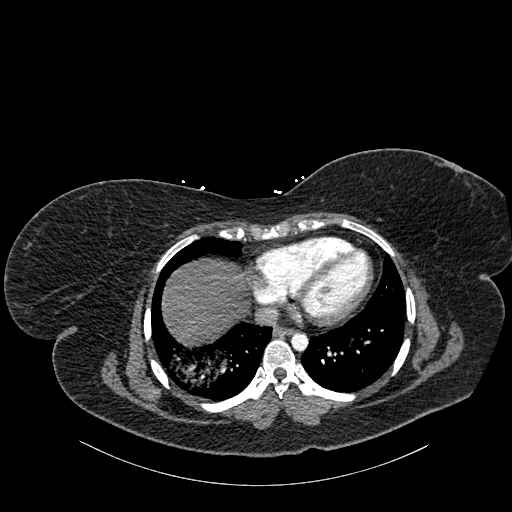
[im 66/216  lung]
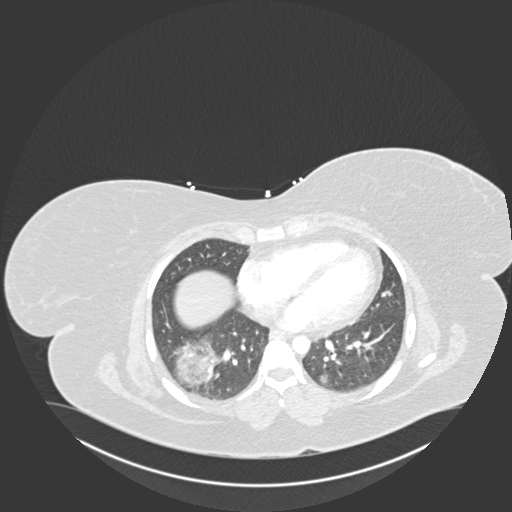
[im 85/216  soft-tissue]
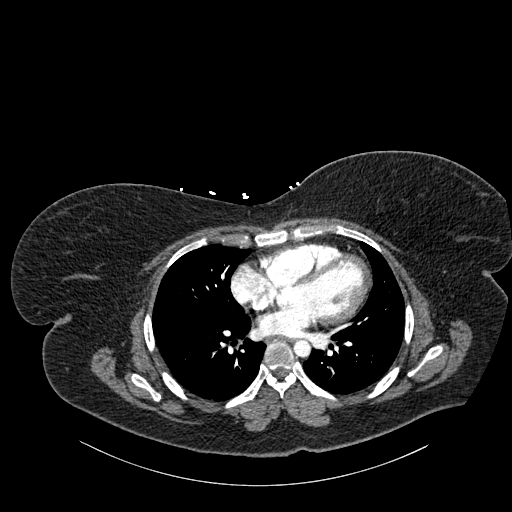
[im 94/216  lung]
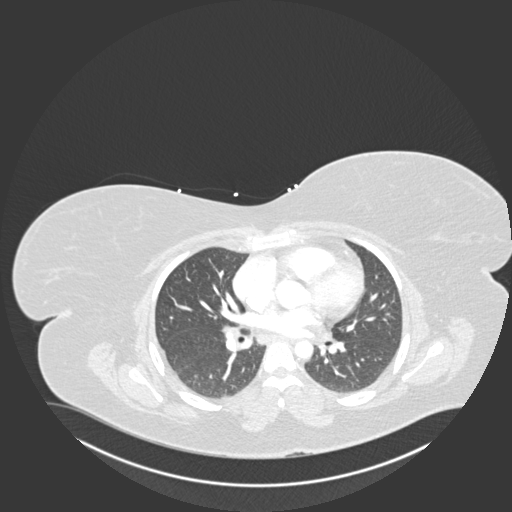
[im 113/216  soft-tissue]
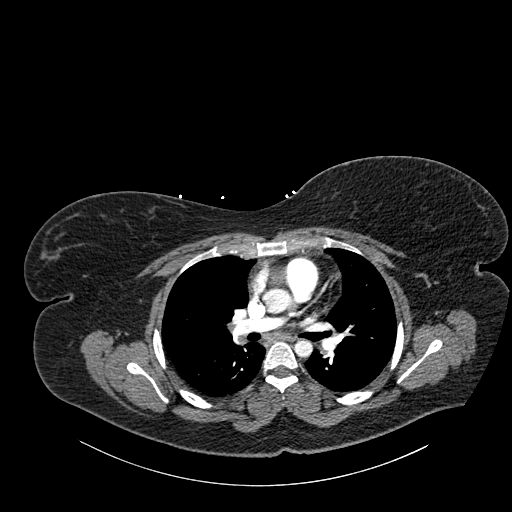
[im 122/216  lung]
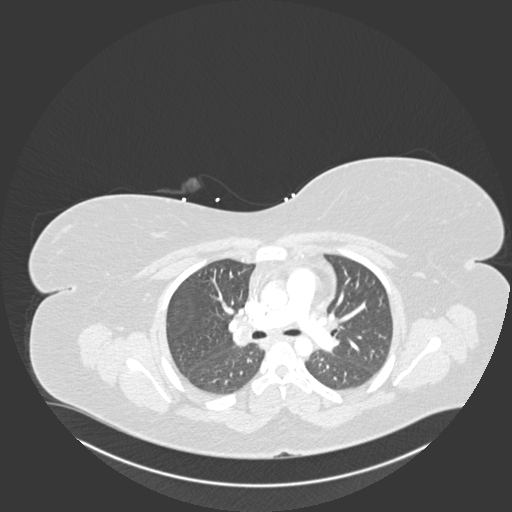
[im 131/216  soft-tissue]
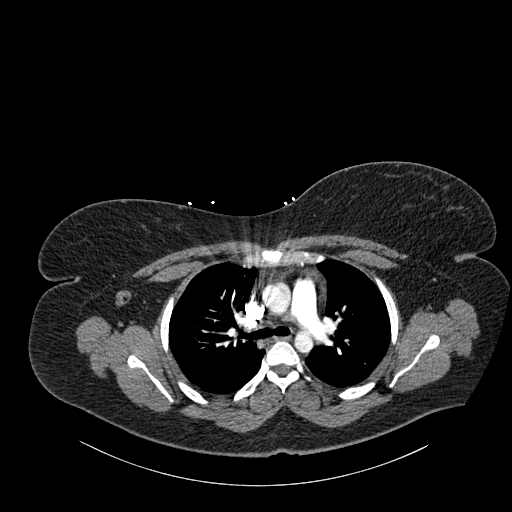
[im 150/216  lung]
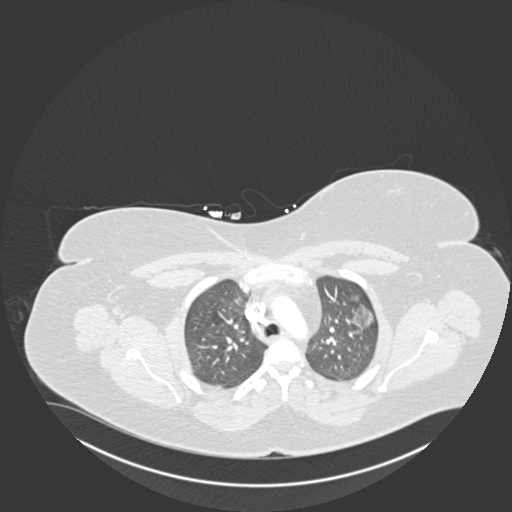
[im 159/216  soft-tissue]
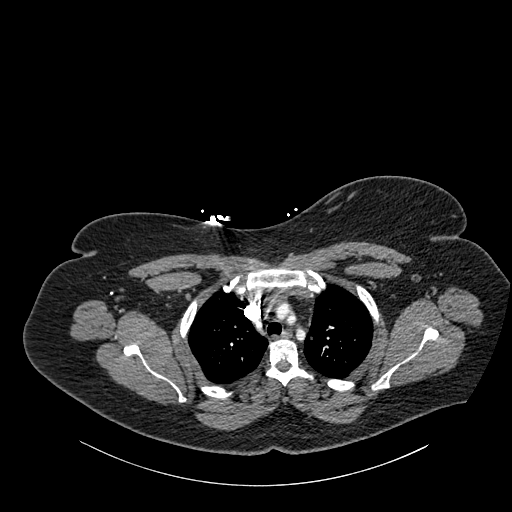
[im 178/216  lung]
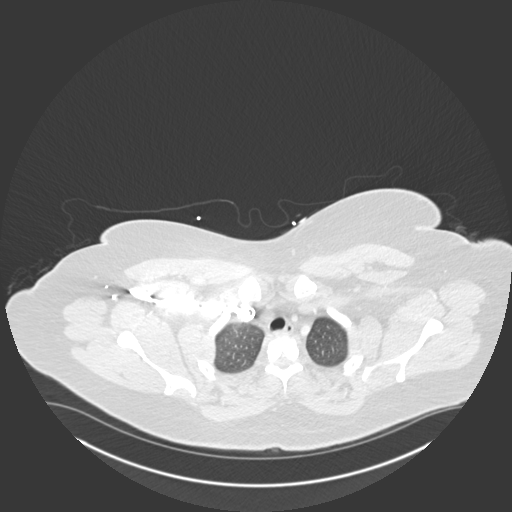
[im 187/216  soft-tissue]
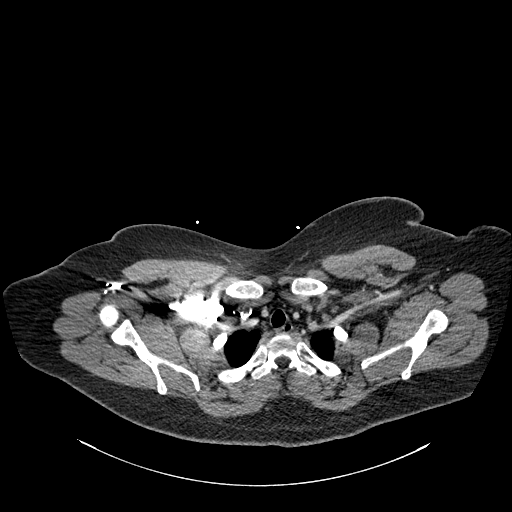
[im 206/216  lung]
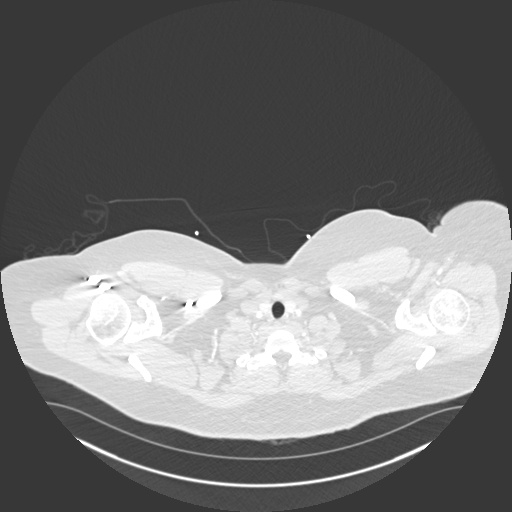

[Series 8: coronal mpr · coronal · 0.43mm/px · 3 of 104 slices shown]
[im 26/104  soft-tissue]
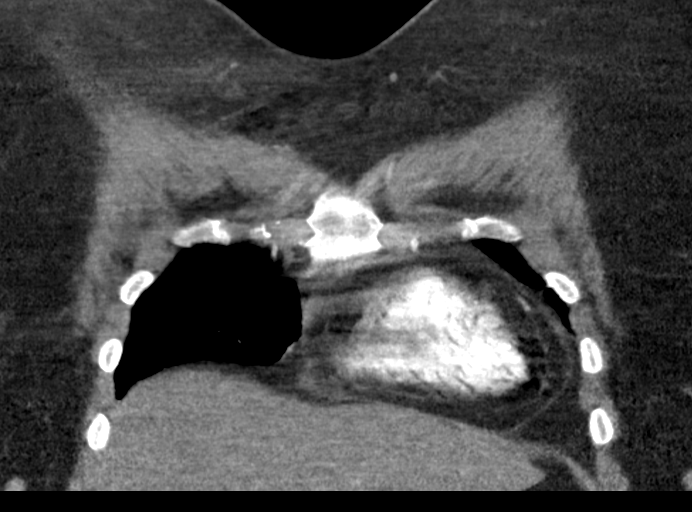
[im 52/104  soft-tissue]
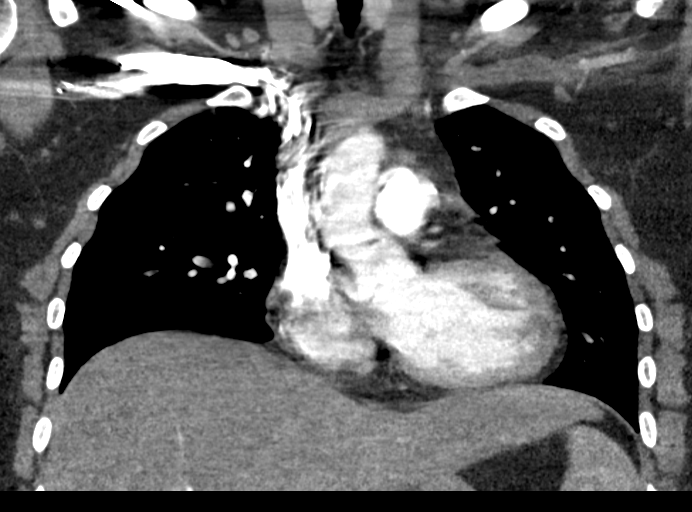
[im 78/104  soft-tissue]
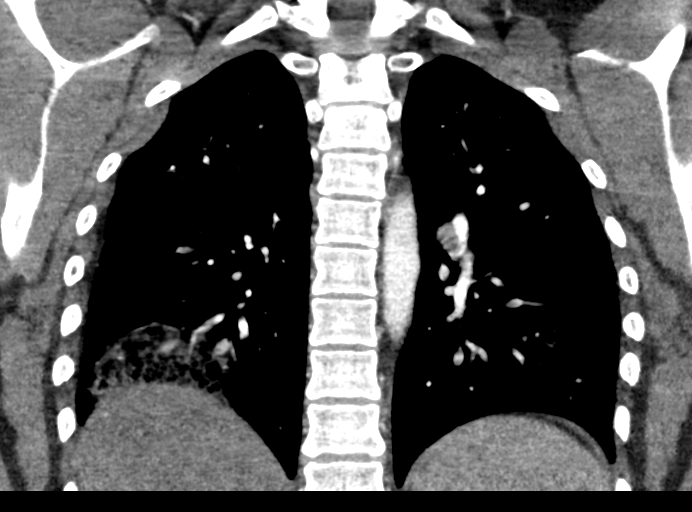

[18 of 46 positions shown; findings below may reference images not displayed]

FINDINGS: Cardiovascular: The thoracic aorta appears unremarkable. There is no
aneurysmal dilatation or evidence of dissection. The origins of the
great vessels of the aortic arch appear patent. Evaluation of the
pulmonary arteries is limited due to respiratory motion artifact and
suboptimal opacification of the peripheral branches. No central
pulmonary artery embolus identified. There is no cardiomegaly or
pericardial effusion.

Mediastinum/Nodes: Right hilar adenopathy measures up to 16 mm. No
significant mediastinal adenopathy. The esophagus and the thyroid
gland are grossly unremarkable.

Lungs/Pleura: Multiple bilateral ground-glass nodular densities with
the largest measuring approximately 4.6 x 6.0 cm at the right lung
base. Findings may represent fungal infection, septic emboli, or
other atypical infection. TB is not excluded but less likely. There
is no pleural effusion or pneumothorax. The central airways are
patent.

Upper Abdomen: Diffuse fatty infiltration of the liver. The
visualized upper abdomen is otherwise unremarkable.

Musculoskeletal: No chest wall abnormality. No acute or significant
osseous findings.

Review of the MIP images confirms the above findings.
IMPRESSION: No definite CT evidence of central pulmonary artery embolus.

Bilateral rounded ground-glass airspace densities with the largest
involving the right lung base concerning for fungal or other
atypical infection, or septic emboli. Other etiologies are not
excluded. Clinical correlation and pulmonary consult is advised.

## 2016-02-18 IMAGING — DX DG CHEST 2V
2 series · 2 of 2 positions shown · non-contrast
Comparison: CT [DATE].  Chest x-ray [DATE].

CLINICAL DATA: Abnormal CT.  Dizziness.

EXAM:
CHEST  2 VIEW

[chest pa]
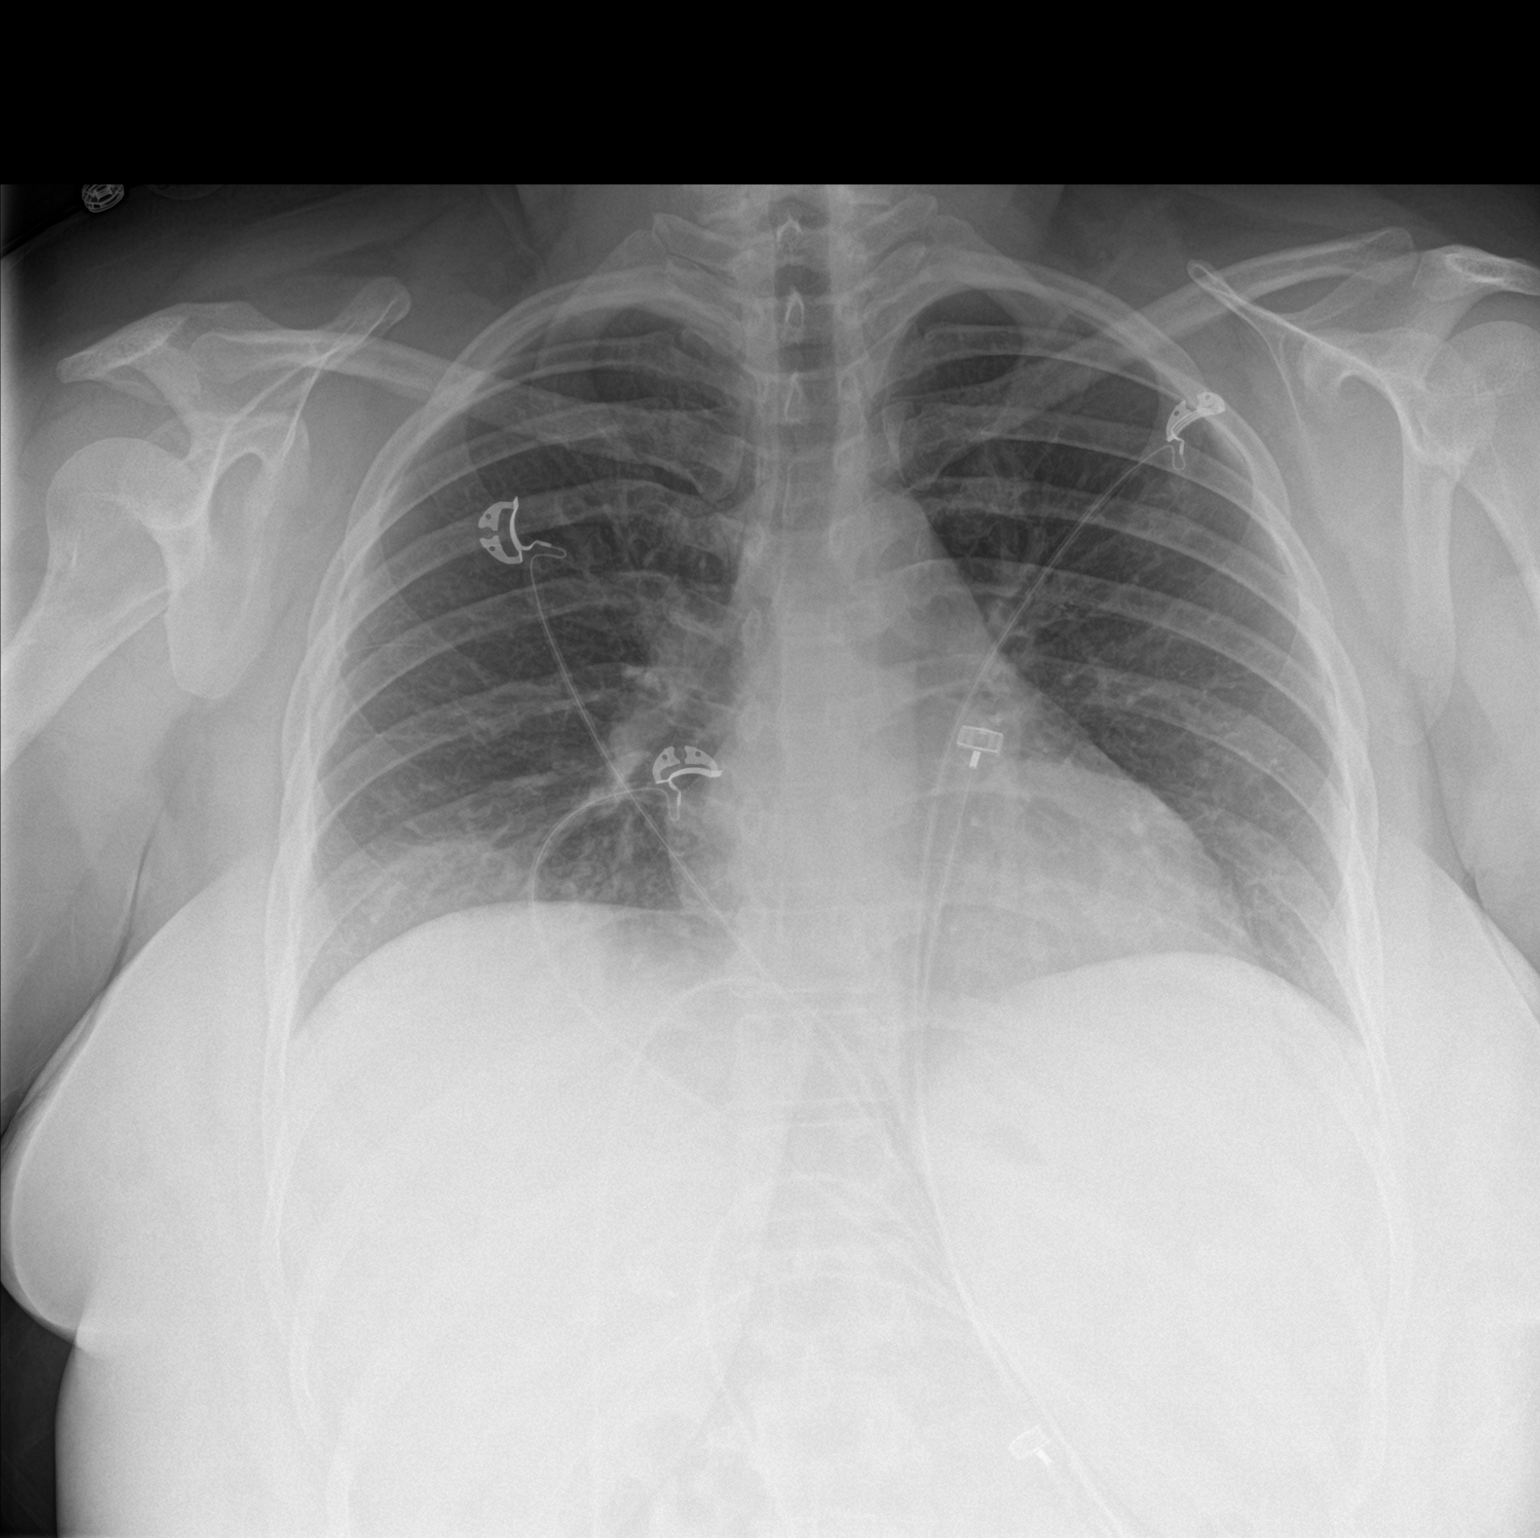

[chest lat]
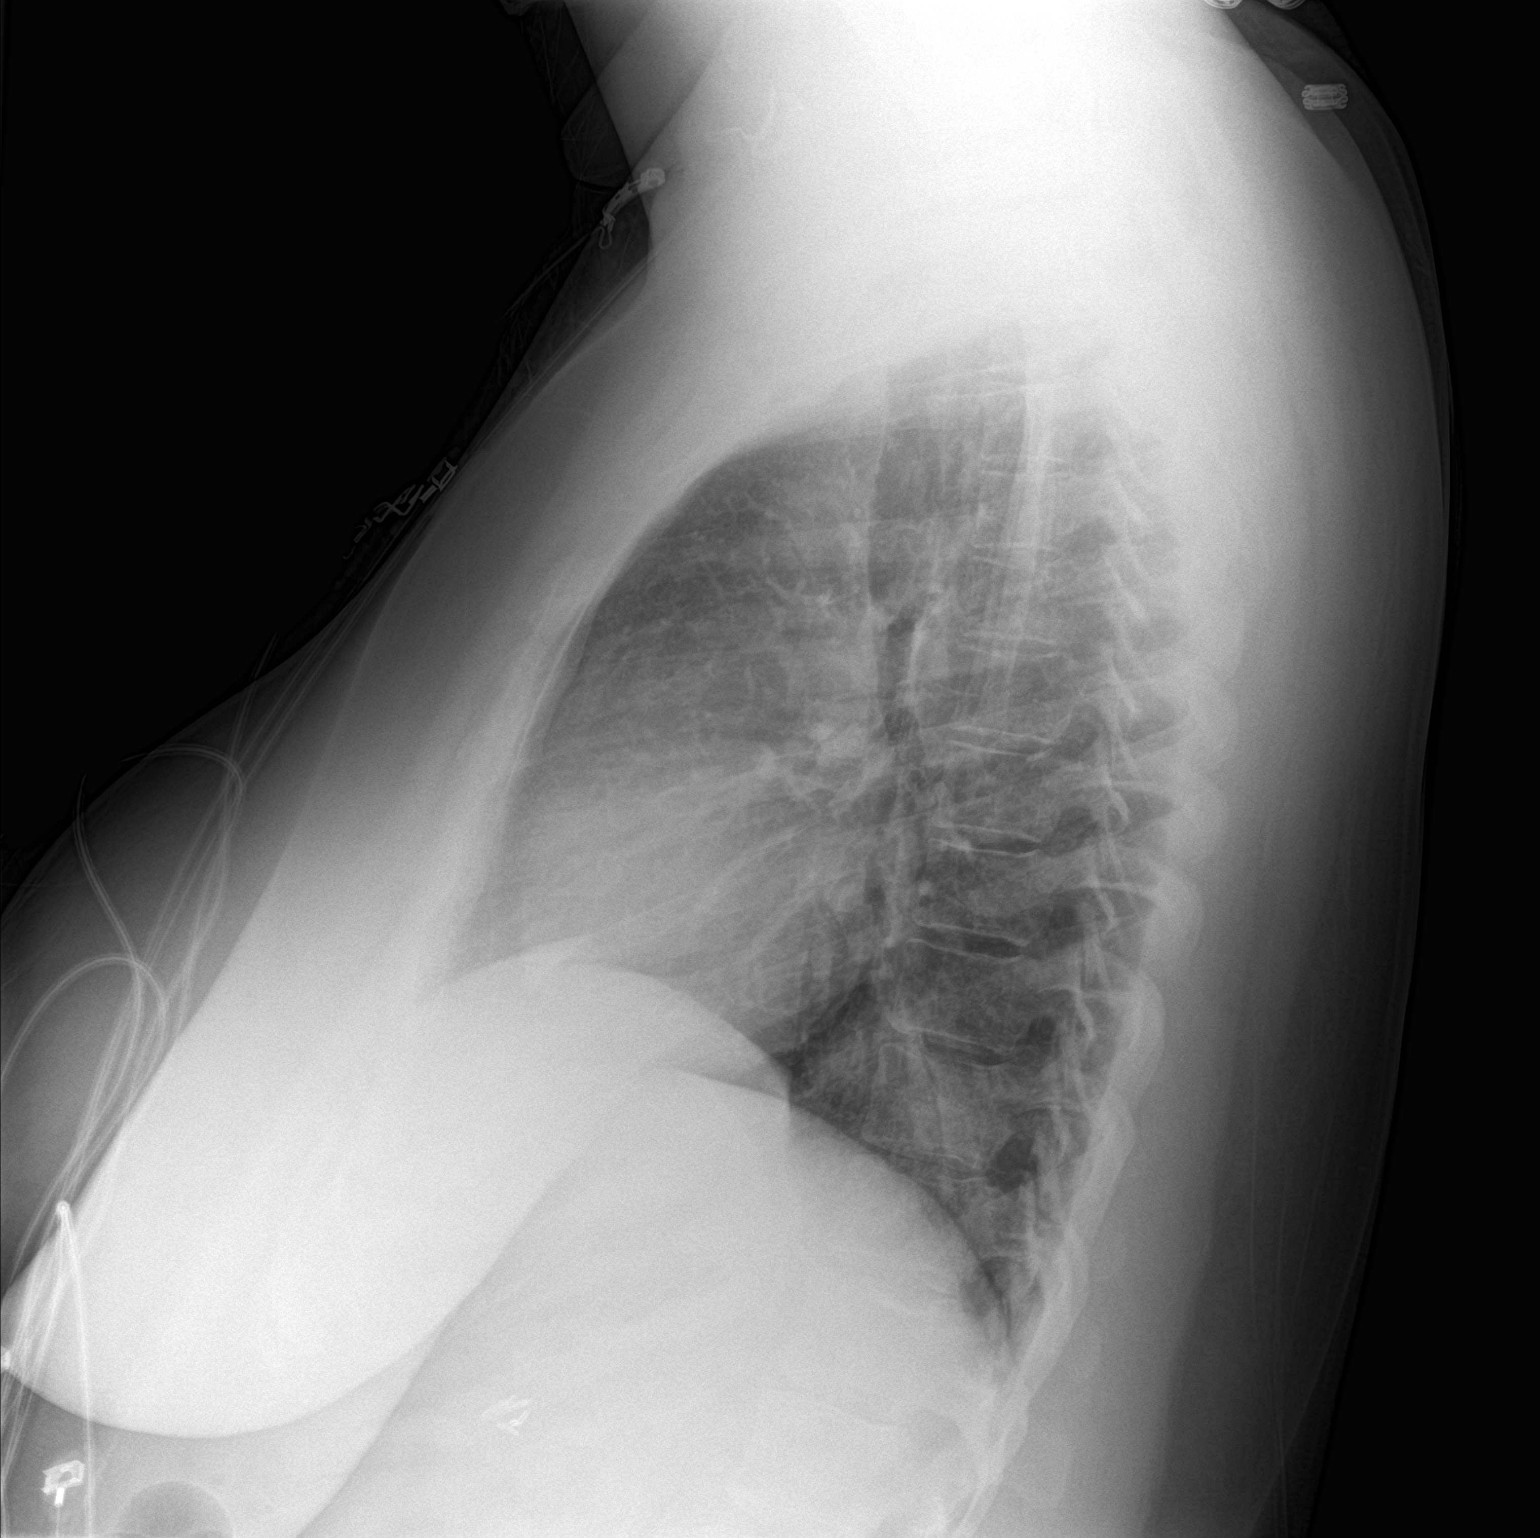

[2 of 2 positions shown; findings below may reference images not displayed]

FINDINGS: Mediastinum hilar structures are unremarkable. Persistent unchanged
infiltrate right lung base. No pleural effusion or pneumothorax. No
acute bony abnormality .
IMPRESSION: Persistent unchanged infiltrate in the right lung base.

## 2016-02-18 MED ORDER — ACETAMINOPHEN 650 MG RE SUPP: 650.0000 mg | Freq: Four times a day (QID) | RECTAL | Status: DC | PRN

## 2016-02-18 MED ORDER — ACETAMINOPHEN 325 MG PO TABS: 650.0000 mg | ORAL_TABLET | Freq: Four times a day (QID) | ORAL | Status: DC | PRN

## 2016-02-18 MED ORDER — ONDANSETRON HCL 4 MG PO TABS: 4.0000 mg | ORAL_TABLET | Freq: Four times a day (QID) | ORAL | Status: DC | PRN

## 2016-02-18 MED ORDER — SODIUM CHLORIDE 0.9 % IV BOLUS (SEPSIS)
1000.0000 mL | Freq: Once | INTRAVENOUS | Status: AC
  Administered 2016-02-18: 1000 mL via INTRAVENOUS

## 2016-02-18 MED ORDER — IOPAMIDOL (ISOVUE-370) INJECTION 76%
100.0000 mL | Freq: Once | INTRAVENOUS | Status: AC | PRN
  Administered 2016-02-18: 100 mL via INTRAVENOUS

## 2016-02-18 MED ORDER — ONDANSETRON HCL 4 MG/2ML IJ SOLN
4.0000 mg | Freq: Four times a day (QID) | INTRAMUSCULAR | Status: DC | PRN
  Administered 2016-02-18: 4 mg via INTRAVENOUS
  Filled 2016-02-18: qty 2

## 2016-02-18 MED ORDER — AZITHROMYCIN 250 MG PO TABS
500.0000 mg | ORAL_TABLET | Freq: Every day | ORAL | Status: DC
  Administered 2016-02-18 – 2016-02-19 (×2): 500 mg via ORAL
  Filled 2016-02-18 (×2): qty 2

## 2016-02-18 MED ORDER — ONDANSETRON 4 MG PO TBDP
4.0000 mg | ORAL_TABLET | Freq: Once | ORAL | Status: AC
  Administered 2016-02-18: 4 mg via ORAL
  Filled 2016-02-18: qty 1

## 2016-02-18 MED ORDER — MECLIZINE HCL 12.5 MG PO TABS
25.0000 mg | ORAL_TABLET | Freq: Once | ORAL | Status: AC
  Administered 2016-02-18: 25 mg via ORAL
  Filled 2016-02-18: qty 2

## 2016-02-18 MED ORDER — SODIUM CHLORIDE 0.9 % IV SOLN
INTRAVENOUS | Status: AC
  Administered 2016-02-18: 11:00:00 via INTRAVENOUS

## 2016-02-18 NOTE — ED Provider Notes (Signed)
2 AM patient left at change of shift to evaluate after her urinalysis returns. She complains of dizziness and when she stands up she feels like she's going to pass out. She feels better she lays flat although she still feels dizzy. She states it started yesterday and she vomited once. Medications were added including meclizine and Zofran and she was given a another liter of normal saline. She reports she only had one episode of vomiting.  Recheck at 3:45 AM patient states when she sits up her dizziness is not as bad but still present. I notice when she sits up her heart rate jumps up to 118. I then asked her she's been having any chest pain and she states she's been having chest pain intermittently all day today and that was when the reason she came to the ED. CT angiogram was done.  05:00 AM Dr Lorin Mercy, wants me to talk to pulmonary at [facility]. Patient denies having any coughing. She does states she has shortness of breath and has been diagnosed with asthma although she does not have wheezing. She denies chills until she arrived to the ED. She denies having fever. I discussed her CT results and that we will going to talk to a pulmonologist about her.  06:23 AM Dr Janann Colonel, pulmonary, has reviewed her CT and feels she needs lab work, possible bronchoscopy. States she can stay at [facility] for further testing and if necessary send to [facility]. He states the roundness of the lesions suggest septic emboli.   06:30 AM patient informed she is going to be admitted. CXR PA and LAT done to have as a baseline.   06:56 AM Dr Lorin Mercy, hospitalitst admit to obs, med-surg   Results for orders placed or performed during the hospital encounter of [id number]  Basic metabolic panel  Result Value Ref Range   Sodium 134 (L) 135 - 145 mmol/L   Potassium 4.0 3.5 - 5.1 mmol/L   Chloride 100 (L) 101 - 111 mmol/L   CO2 25 22 - 32 mmol/L   Glucose, Bld 94 65 - 99 mg/dL   BUN 7 6 - 20 mg/dL   Creatinine, Ser 0.95 0.44 - 1.00 mg/dL   Calcium  9.3 8.9 - 10.3 mg/dL   GFR calc non Af Amer >60 >60 mL/min   GFR calc Af Amer >60 >60 mL/min   Anion gap 9 5 - 15  CBC with Differential  Result Value Ref Range   WBC 8.1 4.0 - 10.5 K/uL   RBC 4.95 3.87 - 5.11 MIL/uL   Hemoglobin 14.2 12.0 - 15.0 g/dL   HCT 41.9 36.0 - 46.0 %   MCV 84.6 78.0 - 100.0 fL   MCH 28.7 26.0 - 34.0 pg   MCHC 33.9 30.0 - 36.0 g/dL   RDW 13.4 11.5 - 15.5 %   Platelets 286 150 - 400 K/uL   Neutrophils Relative % 76 %   Neutro Abs 6.2 1.7 - 7.7 K/uL   Lymphocytes Relative 14 %   Lymphs Abs 1.1 0.7 - 4.0 K/uL   Monocytes Relative 9 %   Monocytes Absolute 0.7 0.1 - 1.0 K/uL   Eosinophils Relative 1 %   Eosinophils Absolute 0.0 0.0 - 0.7 K/uL   Basophils Relative 0 %   Basophils Absolute 0.0 0.0 - 0.1 K/uL  Urinalysis, Routine w reflex microscopic  Result Value Ref Range   Color, Urine YELLOW YELLOW   APPearance CLEAR CLEAR   Specific Gravity, Urine 1.015 1.005 - 1.030  pH 7.0 5.0 - 8.0   Glucose, UA NEGATIVE NEGATIVE mg/dL   Hgb urine dipstick TRACE (A) NEGATIVE   Bilirubin Urine NEGATIVE NEGATIVE   Ketones, ur NEGATIVE NEGATIVE mg/dL   Protein, ur NEGATIVE NEGATIVE mg/dL   Nitrite NEGATIVE NEGATIVE   Leukocytes, UA NEGATIVE NEGATIVE  Pregnancy, urine  Result Value Ref Range   Preg Test, Ur NEGATIVE NEGATIVE  Urine microscopic-add on  Result Value Ref Range   Squamous Epithelial / LPF 6-30 (A) NONE SEEN   WBC, UA 0-5 0 - 5 WBC/hpf   RBC / HPF 0-5 0 - 5 RBC/hpf   Bacteria, UA MANY (A) NONE SEEN    Laboratory interpretation all normal     Ct Angio Chest Pe W/cm &/or Wo Cm  Result Date: 02/18/2016 CLINICAL DATA:  25 year old female with chest pain, shortness of breath, and tachycardia. EXAM: CT ANGIOGRAPHY CHEST WITH CONTRAST TECHNIQUE: Multidetector CT imaging of the chest was performed using the standard protocol during bolus administration of intravenous contrast. Multiplanar CT image reconstructions and MIPs were obtained to evaluate the  vascular anatomy. CONTRAST:  100 cc Isovue 370 COMPARISON:  Chest CT dated 11/04/2014 FINDINGS: Cardiovascular: The thoracic aorta appears unremarkable. There is no aneurysmal dilatation or evidence of dissection. The origins of the great vessels of the aortic arch appear patent. Evaluation of the pulmonary arteries is limited due to respiratory motion artifact and suboptimal opacification of the peripheral branches. No central pulmonary artery embolus identified. There is no cardiomegaly or pericardial effusion. Mediastinum/Nodes: Right hilar adenopathy measures up to 16 mm. No significant mediastinal adenopathy. The esophagus and the thyroid gland are grossly unremarkable. Lungs/Pleura: Multiple bilateral ground-glass nodular densities with the largest measuring approximately 4.6 x 6.0 cm at the right lung base. Findings may represent fungal infection, septic emboli, or other atypical infection. TB is not excluded but less likely. There is no pleural effusion or pneumothorax. The central airways are patent. Upper Abdomen: Diffuse fatty infiltration of the liver. The visualized upper abdomen is otherwise unremarkable. Musculoskeletal: No chest wall abnormality. No acute or significant osseous findings. Review of the MIP images confirms the above findings. IMPRESSION: No definite CT evidence of central pulmonary artery embolus. Bilateral rounded ground-glass airspace densities with the largest involving the right lung base concerning for fungal or other atypical infection, or septic emboli. Other etiologies are not excluded. Clinical correlation and pulmonary consult is advised. Electronically Signed   By: Anner Crete M.D.   On: 02/18/2016 04:44   Dg Chest 2 View  Result Date: 02/18/2016 CLINICAL DATA:  Abnormal CT.  Dizziness. EXAM: CHEST  2 VIEW COMPARISON:  CT 02/18/2016.  Chest x-ray 10/18/2010. FINDINGS: Mediastinum hilar structures are unremarkable. Persistent unchanged infiltrate right lung base. No  pleural effusion or pneumothorax. No acute bony abnormality . IMPRESSION: Persistent unchanged infiltrate in the right lung base. Electronically Signed   By: Marcello Moores  Register   On: 02/18/2016 07:16      Rolland Porter, MD 02/18/16 [phone]

## 2016-02-18 NOTE — Consult Note (Signed)
Consult requested by: Dr. Sarajane Jews Consult requested for abnormal chest CT:  HPI: This is a 25 year old who came to the emergency department because of dizziness. She has had other medical problems including polycystic ovary, anxiety, migraines and chronic tachycardia that started with her pregnancy. She was also short of breath and ended up having CT angiogram which was negative for pulmonary emboli but did show bilateral groundglass airspace opacities more on the right than on the left. She really does not have any pulmonary symptoms except for shortness of breath and she associates that with tachycardia. She's not having any cough. No fever. She's had a little bit of nausea. She's had increased urinary frequency. No chest pain. She says her neck is larger than it has been but she's not had any lymph nodes. No known exposures to noxious substances  Past Medical History:  Diagnosis Date  . Allergy   . Anxiety   . Complication of anesthesia    woke up during surgery & ithing after surgery  . Migraine headache   . Nexplanon in place 10/05/2015   06/29/15 left arm  . Polycystic ovarian syndrome   . Pregnancy induced hypertension   . Tachycardia   . Vaginal Pap smear, abnormal      Family History  Problem Relation Age of Onset  . Cancer Paternal Grandmother     ovarian cancer  . Asthma Son   . ADD / ADHD Son      Social History   Social History  . Marital status: Single    Spouse name: N/A  . Number of children: N/A  . Years of education: N/A   Social History Main Topics  . Smoking status: Former Research scientist (life sciences)  . Smokeless tobacco: Never Used  . Alcohol use No     Comment: occasional  . Drug use: No  . Sexual activity: Not Currently    Birth control/ protection: None, Implant   Other Topics Concern  . None   Social History Narrative  . None     ROS: Other than as mentioned review of systems is negative    Objective: Vital signs in last 24 hours: Temp:  [98.2 F (36.8  C)-99.7 F (37.6 C)] 98.2 F (36.8 C) (10/26 0645) Pulse Rate:  [65-119] 102 (10/26 0900) Resp:  [15-25] 16 (10/26 0900) BP: (108-132)/(68-98) 116/80 (10/26 0900) SpO2:  [95 %-100 %] 99 % (10/26 0900) Weight change:     Intake/Output from previous day: 10/25 0701 - 10/26 0700 In: 2000 [IV Piggyback:2000] Out: -   PHYSICAL EXAM Constitutional: She is an obese female in no acute distress. Eyes: Pupils are reactive. Ears nose mouth and throat: Tympanic membranes are okay. Mucous membranes are moist she does not have any adenopathy in her neck cardiovascular: She is mildly tachycardic. Her heart is regular. I don't hear any abnormal heart sounds. She has no edema. Pulmonary: Her respiratory effort is normal. She has crackles in the right base on lung examination but I don't hear any on the left. Gastrointestinal her abdomen is soft and obese without masses musculoskeletal no swollen joints. Neurological no focal findings. Psychiatric: She appears mildly anxious  Lab Results: Basic Metabolic Panel:  Recent Labs  02/17/16 2234  NA 134*  K 4.0  CL 100*  CO2 25  GLUCOSE 94  BUN 7  CREATININE 0.95  CALCIUM 9.3   Liver Function Tests: No results for input(s): AST, ALT, ALKPHOS, BILITOT, PROT, ALBUMIN in the last 72 hours. No results for input(s): LIPASE, AMYLASE  in the last 72 hours. No results for input(s): AMMONIA in the last 72 hours. CBC:  Recent Labs  02/17/16 2234  WBC 8.1  NEUTROABS 6.2  HGB 14.2  HCT 41.9  MCV 84.6  PLT 286   Cardiac Enzymes: No results for input(s): CKTOTAL, CKMB, CKMBINDEX, TROPONINI in the last 72 hours. BNP: No results for input(s): PROBNP in the last 72 hours. D-Dimer: No results for input(s): DDIMER in the last 72 hours. CBG: No results for input(s): GLUCAP in the last 72 hours. Hemoglobin A1C: No results for input(s): HGBA1C in the last 72 hours. Fasting Lipid Panel: No results for input(s): CHOL, HDL, LDLCALC, TRIG, CHOLHDL,  LDLDIRECT in the last 72 hours. Thyroid Function Tests: No results for input(s): TSH, T4TOTAL, FREET4, T3FREE, THYROIDAB in the last 72 hours. Anemia Panel: No results for input(s): VITAMINB12, FOLATE, FERRITIN, TIBC, IRON, RETICCTPCT in the last 72 hours. Coagulation: No results for input(s): LABPROT, INR in the last 72 hours. Urine Drug Screen: Drugs of Abuse     Component Value Date/Time   LABOPIA NONE DETECTED 11/04/2014 2215   COCAINSCRNUR NONE DETECTED 11/04/2014 2215   COCAINSCRNUR NEG 04/22/2014 1117   LABBENZ NONE DETECTED 11/04/2014 2215   LABBENZ NEG 04/22/2014 1117   AMPHETMU NONE DETECTED 11/04/2014 2215   THCU NONE DETECTED 11/04/2014 2215   LABBARB NONE DETECTED 11/04/2014 2215    Alcohol Level: No results for input(s): ETH in the last 72 hours. Urinalysis:  Recent Labs  02/18/16 0034  COLORURINE YELLOW  LABSPEC 1.015  PHURINE 7.0  GLUCOSEU NEGATIVE  HGBUR TRACE*  BILIRUBINUR NEGATIVE  KETONESUR NEGATIVE  PROTEINUR NEGATIVE  NITRITE NEGATIVE  LEUKOCYTESUR NEGATIVE   Misc. Labs:   ABGS: No results for input(s): PHART, PO2ART, TCO2, HCO3 in the last 72 hours.  Invalid input(s): PCO2   MICROBIOLOGY: No results found for this or any previous visit (from the past 240 hour(s)).  Studies/Results: Dg Chest 2 View  Result Date: 02/18/2016 CLINICAL DATA:  Abnormal CT.  Dizziness. EXAM: CHEST  2 VIEW COMPARISON:  CT 02/18/2016.  Chest x-ray 10/18/2010. FINDINGS: Mediastinum hilar structures are unremarkable. Persistent unchanged infiltrate right lung base. No pleural effusion or pneumothorax. No acute bony abnormality . IMPRESSION: Persistent unchanged infiltrate in the right lung base. Electronically Signed   By: Marcello Moores  Register   On: 02/18/2016 07:16   Ct Angio Chest Pe W/cm &/or Wo Cm  Result Date: 02/18/2016 CLINICAL DATA:  25 year old female with chest pain, shortness of breath, and tachycardia. EXAM: CT ANGIOGRAPHY CHEST WITH CONTRAST TECHNIQUE:  Multidetector CT imaging of the chest was performed using the standard protocol during bolus administration of intravenous contrast. Multiplanar CT image reconstructions and MIPs were obtained to evaluate the vascular anatomy. CONTRAST:  100 cc Isovue 370 COMPARISON:  Chest CT dated 11/04/2014 FINDINGS: Cardiovascular: The thoracic aorta appears unremarkable. There is no aneurysmal dilatation or evidence of dissection. The origins of the great vessels of the aortic arch appear patent. Evaluation of the pulmonary arteries is limited due to respiratory motion artifact and suboptimal opacification of the peripheral branches. No central pulmonary artery embolus identified. There is no cardiomegaly or pericardial effusion. Mediastinum/Nodes: Right hilar adenopathy measures up to 16 mm. No significant mediastinal adenopathy. The esophagus and the thyroid gland are grossly unremarkable. Lungs/Pleura: Multiple bilateral ground-glass nodular densities with the largest measuring approximately 4.6 x 6.0 cm at the right lung base. Findings may represent fungal infection, septic emboli, or other atypical infection. TB is not excluded but less likely. There  is no pleural effusion or pneumothorax. The central airways are patent. Upper Abdomen: Diffuse fatty infiltration of the liver. The visualized upper abdomen is otherwise unremarkable. Musculoskeletal: No chest wall abnormality. No acute or significant osseous findings. Review of the MIP images confirms the above findings. IMPRESSION: No definite CT evidence of central pulmonary artery embolus. Bilateral rounded ground-glass airspace densities with the largest involving the right lung base concerning for fungal or other atypical infection, or septic emboli. Other etiologies are not excluded. Clinical correlation and pulmonary consult is advised. Electronically Signed   By: Anner Crete M.D.   On: 02/18/2016 04:44    Medications:  Prior to Admission:  (Not in a hospital  admission) Scheduled: . ammonia       Continuous:  PRN:  Assesment: She has pulmonary infiltrates. This could be atypical infection which I think is most likely. She could have sarcoidosis. I think it's a less likely that she has fungal or mycobacterial disease or septic emboli considering her history. I would agree with treating her with erythromycin and I would check mycoplasma and Legionella. She may require bronchoscopy but that would best be done as an outpatient Active Problems:   Pulmonary infiltrates    Plan: As above    LOS: 0 days   Emmilynn Marut L 02/18/2016, 9:19 AM

## 2016-02-18 NOTE — H&P (Signed)
History and Physical  [place] N Filyaw [id number] DOB: 11/17/1990 DOA: 02/17/2016  PCP: Purvis Kilts, MD  Patient coming from: Home   Chief Complaint: Dizziness   HPI:  25 year old woman with history of polycystic ovarian disorder, anxiety, migraine headaches, presented with dizziness exacerbated by position change, associated physical numbness, decreased appetite, nausea, vomiting, headache, bilateral tinnitus, increased urinary frequency and elevated heart rate. Because of shortness of breath and chest pain a CT angiogram was obtained which was negative for pulmonary embolus but showed bilateral groundglass airspace densities, largest involving right lung base concerning for fungal, atypical infection or septic emboli. Pulmonology consultation and consideration for bronchoscopy was recommended.  Patient states she began feeling dizzy yesterday with associated ear-ringing, abdominal pain, pleurisy, shortness of breath on exertion, vomiting, and chills. Severity is severe, laying down improves her pain. She reports her heart rate was elevated 166 when she checked it at home.  No diaphoresis. No coughing.   ED Course:  afebrile, VSS, no hypoxia  Pertinent labs: BMP, CBC, urine preg unremarkable/negative EKG: Independently reviewed. ST, no acute changes Imaging: CXR independently reviewed, right base infilitrate CT chest concerning for atypical infection, septic emboli or fungal infection.  Review of Systems:  Negative for fever, weight loss, night sweats, visual changes, sore throat, rash, new muscle aches,  dysuria, bleeding  Past Medical History:  Diagnosis Date  . Allergy   . Anxiety   . Complication of anesthesia    woke up during surgery & ithing after surgery  . Migraine headache   . Nexplanon in place 10/05/2015   06/29/15 left arm  . Polycystic ovarian syndrome   . Pregnancy induced hypertension   . Tachycardia   . Vaginal Pap smear, abnormal     Past Surgical  History:  Procedure Laterality Date  . CHOLECYSTECTOMY       reports that she has quit smoking. She has never used smokeless tobacco. She reports that she does not drink alcohol or use drugs. Ambulatory   Allergies  Allergen Reactions  . Other Anaphylaxis and Other (See Comments)    Pt states that she is allergic to Axe/Tag body spray and Lysol.    . Shellfish Allergy Anaphylaxis and Hives  . Adhesive [Tape] Hives  . Eggs Or Egg-Derived Products Itching and Nausea Only  . Latex Itching    Family History  Problem Relation Age of Onset  . Cancer Paternal Grandmother     ovarian cancer  . Asthma Son   . ADD / ADHD Son      Prior to Admission medications   Medication Sig Start Date End Date Taking? Authorizing Provider  etonogestrel (NEXPLANON) 68 MG IMPL implant 1 each by Subdermal route once.   Yes Historical Provider, MD  ibuprofen (ADVIL,MOTRIN) 800 MG tablet Take 800 mg by mouth daily as needed for mild pain or moderate pain. Reported on 06/23/2015 05/30/15  Yes Historical Provider, MD  ondansetron (ZOFRAN) 4 MG tablet Take 4 mg by mouth every 8 (eight) hours as needed for nausea or vomiting.   Yes Historical Provider, MD  PROAIR HFA 108 (90 Base) MCG/ACT inhaler Inhale 1-2 puffs into the lungs every 6 (six) hours as needed for wheezing. Reported on 10/05/2015 04/24/15  Yes Historical Provider, MD    Physical Exam: Vitals:   02/18/16 0500 02/18/16 0645 02/18/16 0800 02/18/16 0900  BP: 109/71 110/72 119/74 116/80  Pulse: 96 99 91 102  Resp: 22 16 19 16   Temp:  98.2 F (36.8 C)  TempSrc:  Oral    SpO2: 96% 97% 97% 99%    Constitutional:  . Appears calm and comfortable Eyes:  . PERRL and irises appear normal . Normal conjunctivae and lids ENMT:  . external ears, nose appear normal . grossly normal hearing . Lips appear normal; tongue normal.  Neck:  . neck appears normal, no masses, normal ROM, supple . no thyromegaly Respiratory:  . Some posterior crackles, no  rhonchi/wheezes . Respiratory effort normal. No retractions or accessory muscle use Cardiovascular:  . RRR, no m/r/g . No LE extremity edema   Abdomen:   Abdomen appears soft, non tender, and non distended.  . No hernias Musculoskeletal:  . Moves all extremities.  Skin:  . No rashes, lesions, ulcers . palpation of skin: no induration or nodules Psychiatric:  . judgement and insight appear normal . Mental status o Mood, affect appropriate  Wt Readings from Last 3 Encounters:  10/05/15 106.1 kg (234 lb)  08/23/15 103.4 kg (228 lb)  06/23/15 102.5 kg (226 lb)    I have personally reviewed following labs and imaging studies  Labs on Admission:  CBC:  Recent Labs Lab 02/17/16 2234  WBC 8.1  NEUTROABS 6.2  HGB 14.2  HCT 41.9  MCV 84.6  PLT [id number]   Basic Metabolic Panel:  Recent Labs Lab 02/17/16 2234  NA 134*  K 4.0  CL 100*  CO2 25  GLUCOSE 94  BUN 7  CREATININE 0.95  CALCIUM 9.3    Urine analysis:    Component Value Date/Time   COLORURINE YELLOW 02/18/2016 0034   APPEARANCEUR CLEAR 02/18/2016 0034   LABSPEC 1.015 02/18/2016 0034   PHURINE 7.0 02/18/2016 0034   GLUCOSEU NEGATIVE 02/18/2016 0034   HGBUR TRACE (A) 02/18/2016 0034   BILIRUBINUR NEGATIVE 02/18/2016 0034   KETONESUR NEGATIVE 02/18/2016 0034   PROTEINUR NEGATIVE 02/18/2016 0034   UROBILINOGEN 0.2 12/06/2014 1732   NITRITE NEGATIVE 02/18/2016 0034   LEUKOCYTESUR NEGATIVE 02/18/2016 0034      Radiological Exams on Admission: Dg Chest 2 View  Result Date: 02/18/2016 CLINICAL DATA:  Abnormal CT.  Dizziness. EXAM: CHEST  2 VIEW COMPARISON:  CT 02/18/2016.  Chest x-ray 10/18/2010. FINDINGS: Mediastinum hilar structures are unremarkable. Persistent unchanged infiltrate right lung base. No pleural effusion or pneumothorax. No acute bony abnormality . IMPRESSION: Persistent unchanged infiltrate in the right lung base. Electronically Signed   By: Marcello Moores  Register   On: 02/18/2016 07:16   Ct  Angio Chest Pe W/cm &/or Wo Cm  Result Date: 02/18/2016 CLINICAL DATA:  25 year old female with chest pain, shortness of breath, and tachycardia. EXAM: CT ANGIOGRAPHY CHEST WITH CONTRAST TECHNIQUE: Multidetector CT imaging of the chest was performed using the standard protocol during bolus administration of intravenous contrast. Multiplanar CT image reconstructions and MIPs were obtained to evaluate the vascular anatomy. CONTRAST:  100 cc Isovue 370 COMPARISON:  Chest CT dated 11/04/2014 FINDINGS: Cardiovascular: The thoracic aorta appears unremarkable. There is no aneurysmal dilatation or evidence of dissection. The origins of the great vessels of the aortic arch appear patent. Evaluation of the pulmonary arteries is limited due to respiratory motion artifact and suboptimal opacification of the peripheral branches. No central pulmonary artery embolus identified. There is no cardiomegaly or pericardial effusion. Mediastinum/Nodes: Right hilar adenopathy measures up to 16 mm. No significant mediastinal adenopathy. The esophagus and the thyroid gland are grossly unremarkable. Lungs/Pleura: Multiple bilateral ground-glass nodular densities with the largest measuring approximately 4.6 x 6.0 cm at the right lung base. Findings may  represent fungal infection, septic emboli, or other atypical infection. TB is not excluded but less likely. There is no pleural effusion or pneumothorax. The central airways are patent. Upper Abdomen: Diffuse fatty infiltration of the liver. The visualized upper abdomen is otherwise unremarkable. Musculoskeletal: No chest wall abnormality. No acute or significant osseous findings. Review of the MIP images confirms the above findings. IMPRESSION: No definite CT evidence of central pulmonary artery embolus. Bilateral rounded ground-glass airspace densities with the largest involving the right lung base concerning for fungal or other atypical infection, or septic emboli. Other etiologies are  not excluded. Clinical correlation and pulmonary consult is advised. Electronically Signed   By: Anner Crete M.D.   On: 02/18/2016 04:44    EKG: Independently reviewed. EKG shows sinus tachycardia.   Active Problems:   Pulmonary infiltrates   Assessment/Plan 1. Bilateral pulmonary abnormalities/infiltrates with broad differential, per pulmonology atypical infection felt to be most likely. Sarcoidosis in the differential. Less likely fungal, septic emboli or mycobacterial disease. No history of high risk behavior. No history of lung disease. 2. Dizziness, positional; nausea, vomiting. Supportive care. 3. Polycystic ovarian syndrome.    Observation medical bed.  Pulmonology recommends placing her on empiric Zithromax. Follow-up with Dr. Luan Pulling as an outpatient for monitoring and consideration of bronchoscopy.  Screen for HIV  DVT prophylaxis:SCDs  Code Status: Full  Family Communication: Discussed with the patient Disposition Plan: Discharge home in 24 hours  Consults called: Pulmonology   It is my clinical opinion that referral for OBSERVATION is reasonable and necessary in this patient  . presenting with symptoms of dizziness, pleurisy, concerning for lung incfection . with pertinent physical exam findings of inspiratory crackles posteriorly . and pertinent radiographic and laboratory data including bilateral pulmonary infiltrates.  The aforementioned taken together are felt to place the patient at high risk for further clinical deterioration. However it is anticipated that the patient may be medically stable for discharge from the hospital within 24 to 48 hours.    Time spent: 55 minutes  Murray Hodgkins, MD  [facility] Direct contact: [phone] --[place] [place]  --[email or web address]; password [email or web address]  7PM-7AM contact night coverage as above  02/18/2016, 10:05 AM    By signing my name below, I, Collene Leyden, attest that this documentation has been  prepared under the direction and in the presence of Murray Hodgkins, MD. Electronically signed: Collene Leyden, Scribe. 02/18/16 8:58 PM   I personally performed the services described in this documentation. All medical record entries made by the scribe were at my direction. I have reviewed the chart and agree that the record reflects my personal performance and is accurate and complete. Murray Hodgkins, MD

## 2016-02-19 DIAGNOSIS — J189 Pneumonia, unspecified organism: Secondary | ICD-10-CM

## 2016-02-19 DIAGNOSIS — R918 Other nonspecific abnormal finding of lung field: Secondary | ICD-10-CM | POA: Diagnosis not present

## 2016-02-19 LAB — LEGIONELLA PNEUMOPHILA TOTAL AB

## 2016-02-19 LAB — HIV ANTIBODY (ROUTINE TESTING W REFLEX): HIV SCREEN 4TH GENERATION: NONREACTIVE

## 2016-02-19 MED ORDER — AZITHROMYCIN 500 MG PO TABS: 500.0000 mg | ORAL_TABLET | Freq: Every day | ORAL | 0 refills | Status: DC

## 2016-02-19 NOTE — Discharge Summary (Addendum)
Physician Discharge Summary  Dana Mcpherson [id number] DOB: January 20, 1991 DOA: 02/17/2016  PCP: Purvis Kilts, MD  Admit date: 02/17/2016 Discharge date: 02/19/2016  Recommendations for Outpatient Follow-up:  1. Follow-up bilateral pulmonary infiltrates, consistent with atypical infection, consider repeat chest x-ray in 4 weeks to document resolution   Follow-up Information    HAWKINS,EDWARD L, MD On 03/14/2016.   Specialty:  Pulmonary Disease Why:  at 9:00 am Contact information: [address] [place] [address] [phone]        Purvis Kilts, MD In 1 week.   Specialty:  Family Medicine Contact information: [address] [place] [id number] [phone]          Discharge Diagnoses:  1. Bilateral pulmonary infiltrates, presumed atypical pneumonia 2. Nausea, vomiting  Discharge Condition: improved Disposition: home  S: Overall feels okay. Constitutional:  . Appears calm, comfortable Respiratory:  . CTA bilaterally, no w/r/r, normal resp effort Psychiatric:  o Normal mood and affect    Diet recommendation: Regular  Filed Weights   02/18/16 1155  Weight: 103 kg (227 lb)    History of present illness:  25 year old woman with history of polycystic ovarian disorder, anxiety, migraine headaches, presented with dizziness exacerbated by position change, associated physical numbness, decreased appetite, nausea, vomiting, headache, bilateral tinnitus, increased urinary frequency and elevated heart rate. Because of shortness of breath and chest pain a CT angiogram was obtained which was negative for pulmonary embolus but showed bilateral groundglass airspace densities, largest involving right lung base concerning for fungal, atypical infection or septic emboli. Admitted for further evaluation.  Hospital Course:  Seen in consultation with pulmonology, constellation of signs and symptoms felt to be representative of  atypical pneumonia, treatment with Zithromax was recommended. Patient has remained stable and plan for discharge home with outpatient follow-up with pulmonology. No evidence to suggest septic emboli or fungal disease at this point. No history of high-risk behavior.  Consultants: Pulmonology   Discharge Instructions  Discharge Instructions    Diet general    Complete by:  As directed    Discharge instructions    Complete by:  As directed    Call your physician or seek immediate medical attention for pain, fever, shortness of breath or worsening of condition.   Increase activity slowly    Complete by:  As directed        Medication List    TAKE these medications   azithromycin 500 MG tablet Commonly known as:  ZITHROMAX Take 1 tablet (500 mg total) by mouth daily.   ibuprofen 800 MG tablet Commonly known as:  ADVIL,MOTRIN Take 800 mg by mouth daily as needed for mild pain or moderate pain. Reported on 06/23/2015   NEXPLANON 68 MG Impl implant Generic drug:  etonogestrel 1 each by Subdermal route once.   ondansetron 4 MG tablet Commonly known as:  ZOFRAN Take 4 mg by mouth every 8 (eight) hours as needed for nausea or vomiting.   PROAIR HFA 108 (90 Base) MCG/ACT inhaler Generic drug:  albuterol Inhale 1-2 puffs into the lungs every 6 (six) hours as needed for wheezing. Reported on 10/05/2015      Allergies  Allergen Reactions  . Other Anaphylaxis and Other (See Comments)    Pt states that she is allergic to Axe/Tag body spray and Lysol.    . Shellfish Allergy Anaphylaxis and Hives  . Adhesive [Tape] Hives  . Eggs Or Egg-Derived Products Itching and Nausea Only  . Latex Itching  The results of significant diagnostics from this hospitalization (including imaging, microbiology, ancillary and laboratory) are listed below for reference.    Significant Diagnostic Studies: Dg Chest 2 View  Result Date: 02/18/2016 CLINICAL DATA:  Abnormal CT.  Dizziness. EXAM: CHEST  2  VIEW COMPARISON:  CT 02/18/2016.  Chest x-ray 10/18/2010. FINDINGS: Mediastinum hilar structures are unremarkable. Persistent unchanged infiltrate right lung base. No pleural effusion or pneumothorax. No acute bony abnormality . IMPRESSION: Persistent unchanged infiltrate in the right lung base. Electronically Signed   By: Marcello Moores  Register   On: 02/18/2016 07:16   Ct Angio Chest Pe W/cm &/or Wo Cm  Result Date: 02/18/2016 CLINICAL DATA:  25 year old female with chest pain, shortness of breath, and tachycardia. EXAM: CT ANGIOGRAPHY CHEST WITH CONTRAST TECHNIQUE: Multidetector CT imaging of the chest was performed using the standard protocol during bolus administration of intravenous contrast. Multiplanar CT image reconstructions and MIPs were obtained to evaluate the vascular anatomy. CONTRAST:  100 cc Isovue 370 COMPARISON:  Chest CT dated 11/04/2014 FINDINGS: Cardiovascular: The thoracic aorta appears unremarkable. There is no aneurysmal dilatation or evidence of dissection. The origins of the great vessels of the aortic arch appear patent. Evaluation of the pulmonary arteries is limited due to respiratory motion artifact and suboptimal opacification of the peripheral branches. No central pulmonary artery embolus identified. There is no cardiomegaly or pericardial effusion. Mediastinum/Nodes: Right hilar adenopathy measures up to 16 mm. No significant mediastinal adenopathy. The esophagus and the thyroid gland are grossly unremarkable. Lungs/Pleura: Multiple bilateral ground-glass nodular densities with the largest measuring approximately 4.6 x 6.0 cm at the right lung base. Findings may represent fungal infection, septic emboli, or other atypical infection. TB is not excluded but less likely. There is no pleural effusion or pneumothorax. The central airways are patent. Upper Abdomen: Diffuse fatty infiltration of the liver. The visualized upper abdomen is otherwise unremarkable. Musculoskeletal: No chest wall  abnormality. No acute or significant osseous findings. Review of the MIP images confirms the above findings. IMPRESSION: No definite CT evidence of central pulmonary artery embolus. Bilateral rounded ground-glass airspace densities with the largest involving the right lung base concerning for fungal or other atypical infection, or septic emboli. Other etiologies are not excluded. Clinical correlation and pulmonary consult is advised. Electronically Signed   By: Anner Crete M.D.   On: 02/18/2016 04:44    Microbiology: No results found for this or any previous visit (from the past 240 hour(s)).   Labs: Basic Metabolic Panel:  Recent Labs Lab 02/17/16 2234  NA 134*  K 4.0  CL 100*  CO2 25  GLUCOSE 94  BUN 7  CREATININE 0.95  CALCIUM 9.3   CBC:  Recent Labs Lab 02/17/16 2234  WBC 8.1  NEUTROABS 6.2  HGB 14.2  HCT 41.9  MCV 84.6  PLT 286    Principal Problem:   Atypical pneumonia Active Problems:   Pulmonary infiltrates   Time coordinating discharge: 20 minutes  Signed:  Murray Hodgkins, MD [facility] 02/19/2016, 3:43 PM

## 2016-02-19 NOTE — Care Management Note (Signed)
Case Management Note  Patient Details  Name: Dana Mcpherson MRN: [id number] Date of Birth: February 07, 1991    Expected Discharge Date:  02/19/16               Expected Discharge Plan:  Home/Self Care  In-House Referral:  NA  Discharge planning Services  CM Consult  Post Acute Care Choice:  NA Choice offered to:  NA  DME Arranged:    DME Agency:     HH Arranged:    [place] Agency:     Status of Service:  Completed, signed off  If discussed at [employer] of Stay Meetings, dates discussed:    Additional Comments: Chart reviewed. No CM needs identified.   Dejah Droessler, Chauncey Reading, RN 02/19/2016, 9:42 AM

## 2016-02-19 NOTE — Progress Notes (Signed)
Subjective: She says she feels better and hopes to go home. No new complaints. She does have some cough. She had low-grade fever  Objective: Vital signs in last 24 hours: Temp:  [98.3 F (36.8 C)-98.9 F (37.2 C)] 98.3 F (36.8 C) (10/27 0628) Pulse Rate:  [87-102] 87 (10/27 0628) Resp:  [14-18] 18 (10/27 0628) BP: (100-116)/(60-80) 104/68 (10/27 0628) SpO2:  [98 %-100 %] 100 % (10/27 0628) Weight:  [103 kg (227 lb)] 103 kg (227 lb) (10/26 1155) Weight change:  Last BM Date: 02/17/16  Intake/Output from previous day: 10/26 0701 - 10/27 0700 In: 788.3 [P.O.:360; I.V.:428.3] Out: 1200 [Urine:1200]  PHYSICAL EXAM General appearance: alert, cooperative, no distress and morbidly obese Resp: She still has some rales in the right base Cardio: regular rate and rhythm, S1, S2 normal, no murmur, click, rub or gallop GI: soft, non-tender; bowel sounds normal; no masses,  no organomegaly Extremities: extremities normal, atraumatic, no cyanosis or edema Skin warm and dry. Mucous membranes are moist  Lab Results:  Results for orders placed or performed during the hospital encounter of 02/17/16 (from the past 48 hour(s))  Basic metabolic panel     Status: Abnormal   Collection Time: 02/17/16 10:34 PM  Result Value Ref Range   Sodium 134 (L) 135 - 145 mmol/L   Potassium 4.0 3.5 - 5.1 mmol/L   Chloride 100 (L) 101 - 111 mmol/L   CO2 25 22 - 32 mmol/L   Glucose, Bld 94 65 - 99 mg/dL   BUN 7 6 - 20 mg/dL   Creatinine, Ser 3.85 0.44 - 1.00 mg/dL   Calcium 9.3 8.9 - 11.4 mg/dL   GFR calc non Af Amer >60 >60 mL/min   GFR calc Af Amer >60 >60 mL/min    Comment: (NOTE) The eGFR has been calculated using the CKD EPI equation. This calculation has not been validated in all clinical situations. eGFR's persistently <60 mL/min signify possible Chronic Kidney Disease.    Anion gap 9 5 - 15  CBC with Differential     Status: None   Collection Time: 02/17/16 10:34 PM  Result Value Ref Range   WBC 8.1 4.0 - 10.5 K/uL   RBC 4.95 3.87 - 5.11 MIL/uL   Hemoglobin 14.2 12.0 - 15.0 g/dL   HCT 68.5 55.7 - 37.1 %   MCV 84.6 78.0 - 100.0 fL   MCH 28.7 26.0 - 34.0 pg   MCHC 33.9 30.0 - 36.0 g/dL   RDW 73.0 70.3 - 37.7 %   Platelets 286 150 - 400 K/uL   Neutrophils Relative % 76 %   Neutro Abs 6.2 1.7 - 7.7 K/uL   Lymphocytes Relative 14 %   Lymphs Abs 1.1 0.7 - 4.0 K/uL   Monocytes Relative 9 %   Monocytes Absolute 0.7 0.1 - 1.0 K/uL   Eosinophils Relative 1 %   Eosinophils Absolute 0.0 0.0 - 0.7 K/uL   Basophils Relative 0 %   Basophils Absolute 0.0 0.0 - 0.1 K/uL  Urinalysis, Routine w reflex microscopic     Status: Abnormal   Collection Time: 02/18/16 12:34 AM  Result Value Ref Range   Color, Urine YELLOW YELLOW   APPearance CLEAR CLEAR   Specific Gravity, Urine 1.015 1.005 - 1.030   pH 7.0 5.0 - 8.0   Glucose, UA NEGATIVE NEGATIVE mg/dL   Hgb urine dipstick TRACE (A) NEGATIVE   Bilirubin Urine NEGATIVE NEGATIVE   Ketones, ur NEGATIVE NEGATIVE mg/dL   Protein, ur NEGATIVE NEGATIVE  mg/dL   Nitrite NEGATIVE NEGATIVE   Leukocytes, UA NEGATIVE NEGATIVE  Pregnancy, urine     Status: None   Collection Time: 02/18/16 12:34 AM  Result Value Ref Range   Preg Test, Ur NEGATIVE NEGATIVE    Comment:        THE SENSITIVITY OF THIS METHODOLOGY IS >20 mIU/mL.   Urine microscopic-add on     Status: Abnormal   Collection Time: 02/18/16 12:34 AM  Result Value Ref Range   Squamous Epithelial / LPF 6-30 (A) NONE SEEN   WBC, UA 0-5 0 - 5 WBC/hpf   RBC / HPF 0-5 0 - 5 RBC/hpf   Bacteria, UA MANY (A) NONE SEEN  Strep pneumoniae urinary antigen     Status: None   Collection Time: 02/18/16 10:05 AM  Result Value Ref Range   Strep Pneumo Urinary Antigen NEGATIVE NEGATIVE    Comment:        Infection due to S. pneumoniae cannot be absolutely ruled out since the antigen present may be below the detection limit of the test. Performed at [facility]     ABGS No results  for input(s): PHART, PO2ART, TCO2, HCO3 in the last 72 hours.  Invalid input(s): PCO2 CULTURES No results found for this or any previous visit (from the past 240 hour(s)). Studies/Results: Dg Chest 2 View  Result Date: 02/18/2016 CLINICAL DATA:  Abnormal CT.  Dizziness. EXAM: CHEST  2 VIEW COMPARISON:  CT 02/18/2016.  Chest x-ray 10/18/2010. FINDINGS: Mediastinum hilar structures are unremarkable. Persistent unchanged infiltrate right lung base. No pleural effusion or pneumothorax. No acute bony abnormality . IMPRESSION: Persistent unchanged infiltrate in the right lung base. Electronically Signed   By: Marcello Moores  Register   On: 02/18/2016 07:16   Ct Angio Chest Pe W/cm &/or Wo Cm  Result Date: 02/18/2016 CLINICAL DATA:  25 year old female with chest pain, shortness of breath, and tachycardia. EXAM: CT ANGIOGRAPHY CHEST WITH CONTRAST TECHNIQUE: Multidetector CT imaging of the chest was performed using the standard protocol during bolus administration of intravenous contrast. Multiplanar CT image reconstructions and MIPs were obtained to evaluate the vascular anatomy. CONTRAST:  100 cc Isovue 370 COMPARISON:  Chest CT dated 11/04/2014 FINDINGS: Cardiovascular: The thoracic aorta appears unremarkable. There is no aneurysmal dilatation or evidence of dissection. The origins of the great vessels of the aortic arch appear patent. Evaluation of the pulmonary arteries is limited due to respiratory motion artifact and suboptimal opacification of the peripheral branches. No central pulmonary artery embolus identified. There is no cardiomegaly or pericardial effusion. Mediastinum/Nodes: Right hilar adenopathy measures up to 16 mm. No significant mediastinal adenopathy. The esophagus and the thyroid gland are grossly unremarkable. Lungs/Pleura: Multiple bilateral ground-glass nodular densities with the largest measuring approximately 4.6 x 6.0 cm at the right lung base. Findings may represent fungal infection,  septic emboli, or other atypical infection. TB is not excluded but less likely. There is no pleural effusion or pneumothorax. The central airways are patent. Upper Abdomen: Diffuse fatty infiltration of the liver. The visualized upper abdomen is otherwise unremarkable. Musculoskeletal: No chest wall abnormality. No acute or significant osseous findings. Review of the MIP images confirms the above findings. IMPRESSION: No definite CT evidence of central pulmonary artery embolus. Bilateral rounded ground-glass airspace densities with the largest involving the right lung base concerning for fungal or other atypical infection, or septic emboli. Other etiologies are not excluded. Clinical correlation and pulmonary consult is advised. Electronically Signed   By: Laren Everts.D.  On: 02/18/2016 04:44    Medications:  Prior to Admission:  Prescriptions Prior to Admission  Medication Sig Dispense Refill Last Dose  . etonogestrel (NEXPLANON) 68 MG IMPL implant 1 each by Subdermal route once.   current  . ibuprofen (ADVIL,MOTRIN) 800 MG tablet Take 800 mg by mouth daily as needed for mild pain or moderate pain. Reported on 06/23/2015   Past Month at Unknown time  . ondansetron (ZOFRAN) 4 MG tablet Take 4 mg by mouth every 8 (eight) hours as needed for nausea or vomiting.   02/17/2016 at Unknown time  . PROAIR HFA 108 (90 Base) MCG/ACT inhaler Inhale 1-2 puffs into the lungs every 6 (six) hours as needed for wheezing. Reported on 10/05/2015   unknown   Scheduled: . azithromycin  500 mg Oral Daily   Continuous:  [id number] **OR** acetaminophen, ondansetron **OR** ondansetron (ZOFRAN) IV  Assesment: She was admitted with pulmonary infiltrates. She is in observation. I think with low-grade fever now it is more convincing that she has atypical pneumonia. She would need treatment for this probably continuation of azithromycin. She will need follow-up chest x-ray to be sure that the infiltrates have  cleared but that may take a month or more. Active Problems:   Pulmonary infiltrates    Plan: As above    LOS: 0 days   Shaylinn Hladik L 02/19/2016, 8:35 AM

## 2016-02-20 LAB — LEGIONELLA PNEUMOPHILA SEROGP 1 UR AG: L. PNEUMOPHILA SEROGP 1 UR AG: NEGATIVE

## 2016-02-20 LAB — MYCOPLASMA PNEUMONIAE ANTIBODY, IGM: Mycoplasma pneumo IgM: <770 U/mL (ref 0–769)

## 2016-03-14 ENCOUNTER — Ambulatory Visit (HOSPITAL_COMMUNITY)
Admission: RE | Admit: 2016-03-14 | Discharge: 2016-03-14 | Disposition: A | Discharge status: Home / Self Care | Payer: 59 | Source: Ambulatory Visit | Attending: Pulmonary Disease | Admitting: Pulmonary Disease

## 2016-03-14 ENCOUNTER — Other Ambulatory Visit (HOSPITAL_COMMUNITY): Payer: Self-pay | Admitting: Pulmonary Disease

## 2016-03-14 DIAGNOSIS — J189 Pneumonia, unspecified organism: Secondary | ICD-10-CM

## 2016-03-14 DIAGNOSIS — Z8701 Personal history of pneumonia (recurrent): Secondary | ICD-10-CM | POA: Insufficient documentation

## 2016-03-14 DIAGNOSIS — Z09 Encounter for follow-up examination after completed treatment for conditions other than malignant neoplasm: Secondary | ICD-10-CM | POA: Insufficient documentation

## 2016-03-14 IMAGING — DX DG CHEST 2V
2 series · 2 of 2 positions shown · non-contrast
Comparison: [DATE]

CLINICAL DATA: Follow-up pneumonia.  Chest pain with exertion.

EXAM:
CHEST  2 VIEW

[chest pa]
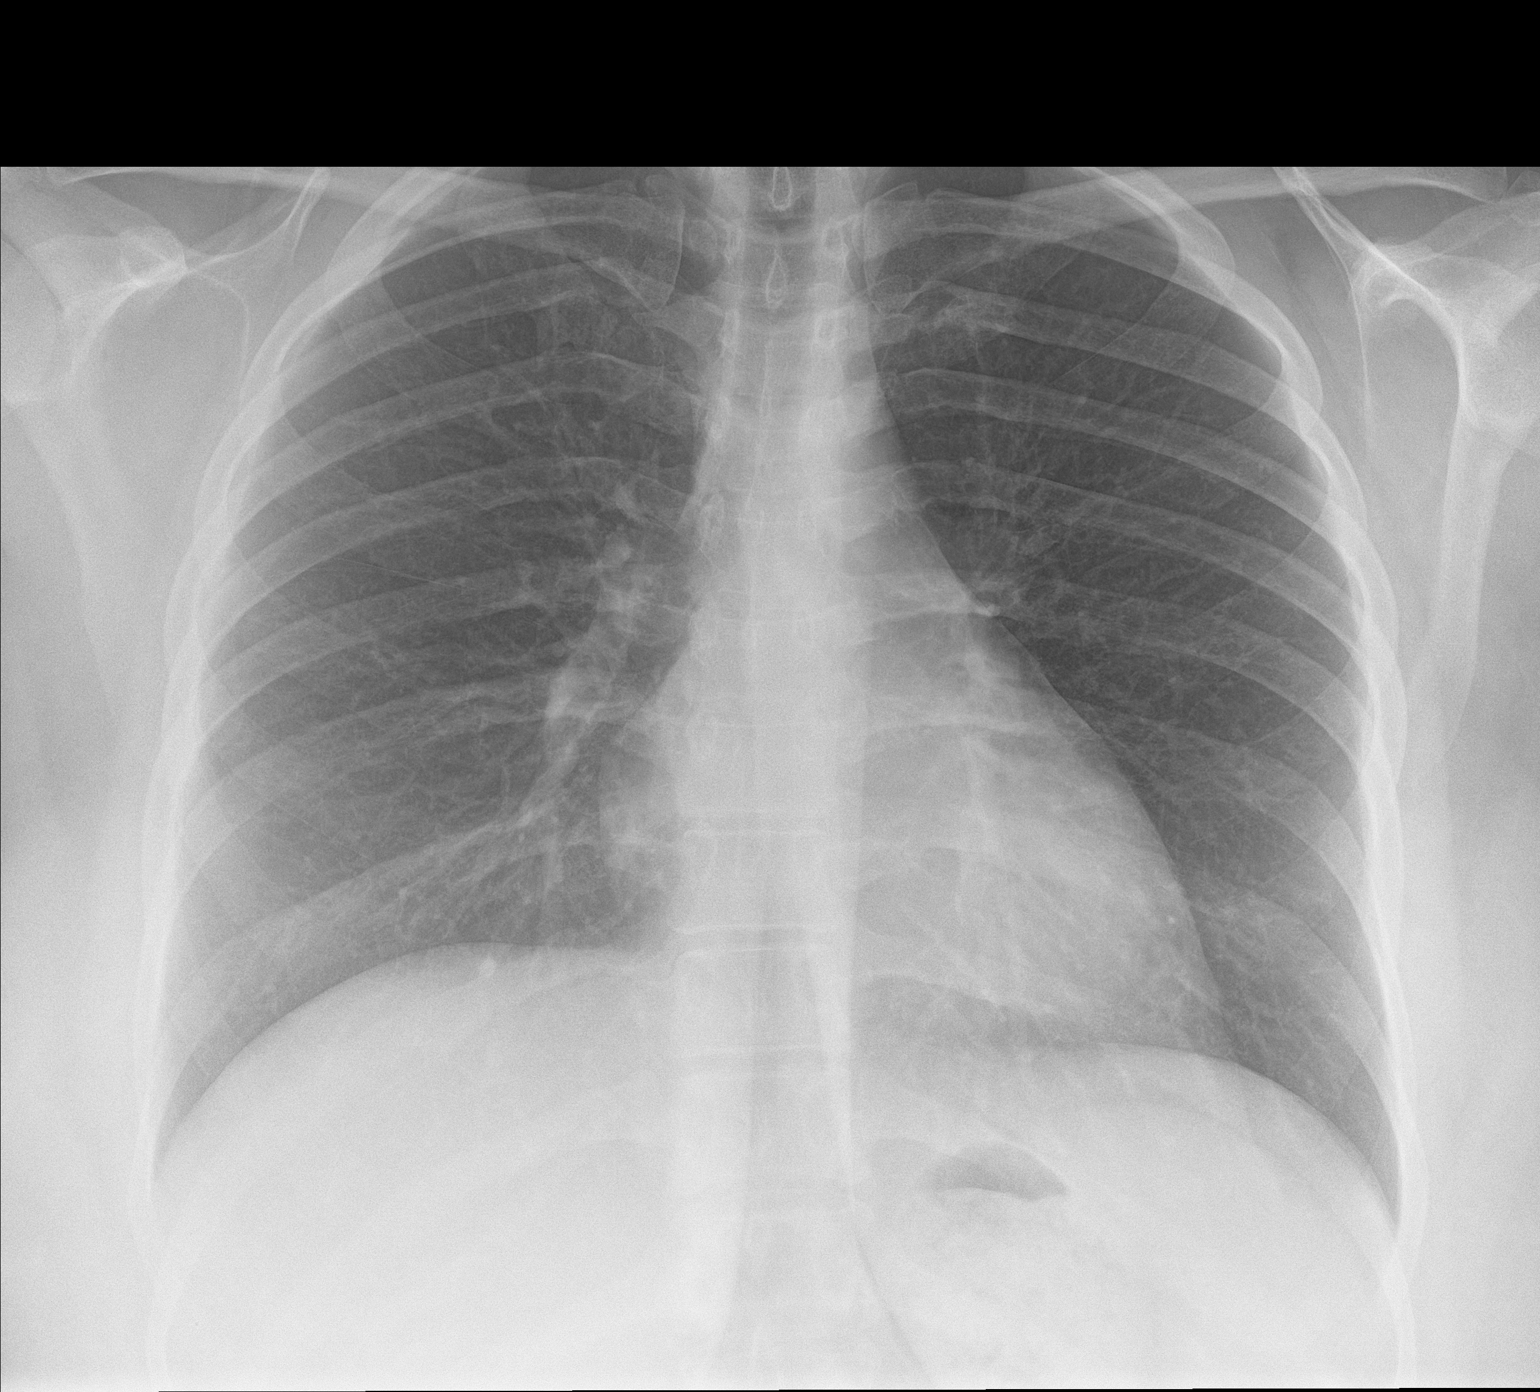

[chest lat]
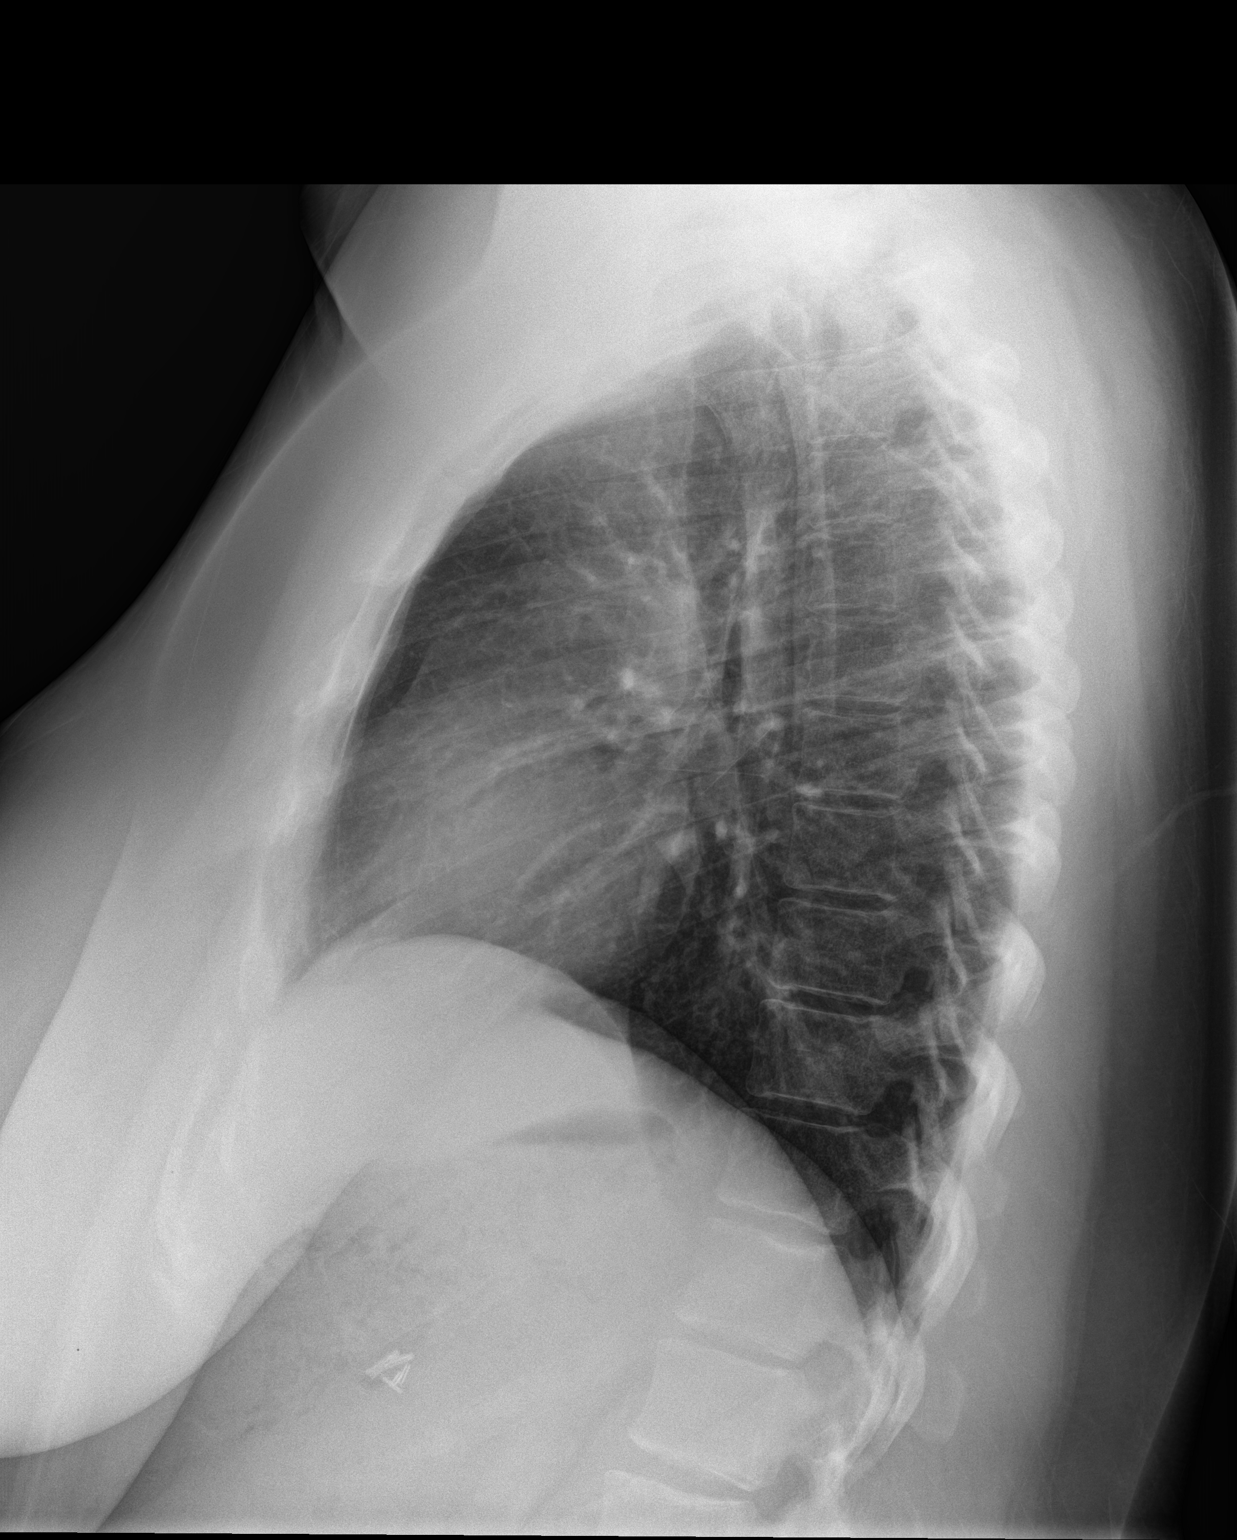

[2 of 2 positions shown; findings below may reference images not displayed]

FINDINGS: Previously seen right lung base infiltrate has resolved. No focal
opacities, effusions or acute bony abnormality. Heart is normal
size.
IMPRESSION: Resolution of previously seen right lower lobe pneumonia. No active
disease.

## 2016-04-23 ENCOUNTER — Emergency Department (HOSPITAL_COMMUNITY): Payer: 59

## 2016-04-23 ENCOUNTER — Encounter (HOSPITAL_COMMUNITY): Payer: Self-pay | Admitting: *Deleted

## 2016-04-23 ENCOUNTER — Emergency Department (HOSPITAL_COMMUNITY)
Admission: EM | Admit: 2016-04-23 | Discharge: 2016-04-23 | Disposition: A | Discharge status: Home / Self Care | Payer: 59 | Attending: Emergency Medicine | Admitting: Emergency Medicine

## 2016-04-23 DIAGNOSIS — R1013 Epigastric pain: Secondary | ICD-10-CM | POA: Insufficient documentation

## 2016-04-23 DIAGNOSIS — R6883 Chills (without fever): Secondary | ICD-10-CM | POA: Insufficient documentation

## 2016-04-23 DIAGNOSIS — R109 Unspecified abdominal pain: Secondary | ICD-10-CM

## 2016-04-23 DIAGNOSIS — R112 Nausea with vomiting, unspecified: Secondary | ICD-10-CM | POA: Diagnosis not present

## 2016-04-23 DIAGNOSIS — Z79899 Other long term (current) drug therapy: Secondary | ICD-10-CM | POA: Diagnosis not present

## 2016-04-23 DIAGNOSIS — Z87891 Personal history of nicotine dependence: Secondary | ICD-10-CM | POA: Diagnosis not present

## 2016-04-23 DIAGNOSIS — R000 Tachycardia, unspecified: Secondary | ICD-10-CM | POA: Diagnosis not present

## 2016-04-23 LAB — URINALYSIS, ROUTINE W REFLEX MICROSCOPIC
Bilirubin Urine: NEGATIVE
Glucose, UA: NEGATIVE mg/dL
Ketones, ur: 20 mg/dL — AB
Nitrite: NEGATIVE
PROTEIN: 30 mg/dL — AB
Specific Gravity, Urine: 1.027 (ref 1.005–1.030)
pH: 5 (ref 5.0–8.0)

## 2016-04-23 LAB — CBC WITH DIFFERENTIAL/PLATELET
Basophils Absolute: 0 10*3/uL (ref 0.0–0.1)
Basophils Relative: 0 %
EOS ABS: 0 10*3/uL (ref 0.0–0.7)
Eosinophils Relative: 0 %
HEMATOCRIT: 43 % (ref 36.0–46.0)
HEMOGLOBIN: 14.2 g/dL (ref 12.0–15.0)
LYMPHS ABS: 0.5 10*3/uL — AB (ref 0.7–4.0)
Lymphocytes Relative: 5 %
MCH: 28.2 pg (ref 26.0–34.0)
MCHC: 33 g/dL (ref 30.0–36.0)
MCV: 85.3 fL (ref 78.0–100.0)
MONOS PCT: 3 %
Monocytes Absolute: 0.3 10*3/uL (ref 0.1–1.0)
NEUTROS PCT: 92 %
Neutro Abs: 9.3 10*3/uL — ABNORMAL HIGH (ref 1.7–7.7)
Platelets: 321 10*3/uL (ref 150–400)
RBC: 5.04 MIL/uL (ref 3.87–5.11)
RDW: 13.6 % (ref 11.5–15.5)
WBC: 10.1 10*3/uL (ref 4.0–10.5)

## 2016-04-23 LAB — COMPREHENSIVE METABOLIC PANEL
ALBUMIN: 4.5 g/dL (ref 3.5–5.0)
ALT: 46 U/L (ref 14–54)
AST: 32 U/L (ref 15–41)
Alkaline Phosphatase: 105 U/L (ref 38–126)
Anion gap: 8 (ref 5–15)
BUN: 11 mg/dL (ref 6–20)
CHLORIDE: 104 mmol/L (ref 101–111)
CO2: 24 mmol/L (ref 22–32)
CREATININE: 0.73 mg/dL (ref 0.44–1.00)
Calcium: 9.2 mg/dL (ref 8.9–10.3)
GFR calc non Af Amer: >60 mL/min (ref >60)
Glucose, Bld: 116 mg/dL — ABNORMAL HIGH (ref 65–99)
Potassium: 3.7 mmol/L (ref 3.5–5.1)
Sodium: 136 mmol/L (ref 135–145)
Total Bilirubin: 0.8 mg/dL (ref 0.3–1.2)
Total Protein: 8.5 g/dL — ABNORMAL HIGH (ref 6.5–8.1)

## 2016-04-23 LAB — LIPASE, BLOOD: LIPASE: 26 U/L (ref 11–51)

## 2016-04-23 LAB — POC URINE PREG, ED: PREG TEST UR: NEGATIVE

## 2016-04-23 IMAGING — CT CT RENAL STONE PROTOCOL
2 of 4 series · 16 of 46 positions shown, 18 images · non-contrast
Comparison: Chest CT [DATE].

CLINICAL DATA: Patient with diffuse abdominal pain. Prior
cholecystectomy.

EXAM:
CT ABDOMEN AND PELVIS WITHOUT CONTRAST
TECHNIQUE: Multidetector CT imaging of the abdomen and pelvis was performed
following the standard protocol without IV contrast.

[Series 2: axial st · axial · 0.98mm/px · z∈[-446,-6]mm · 13 of 96 slices shown, 15 images]
[im 4/96  soft-tissue]
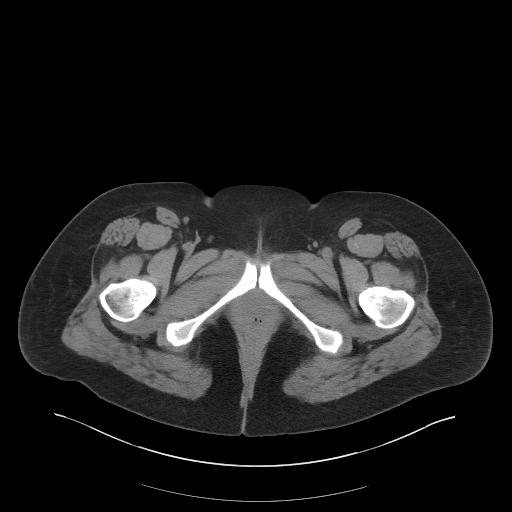
[im 4/96  bone]
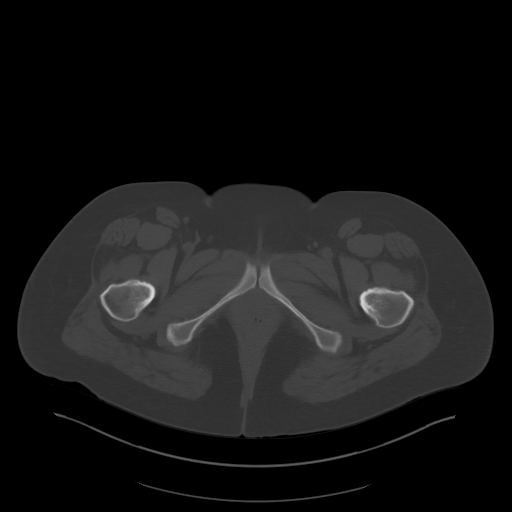
[im 12/96  soft-tissue]
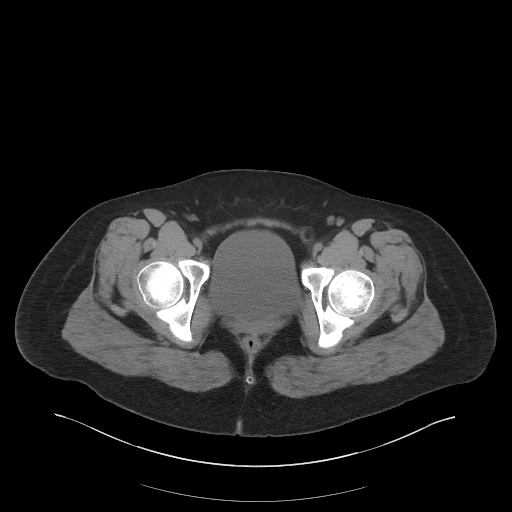
[im 20/96  soft-tissue]
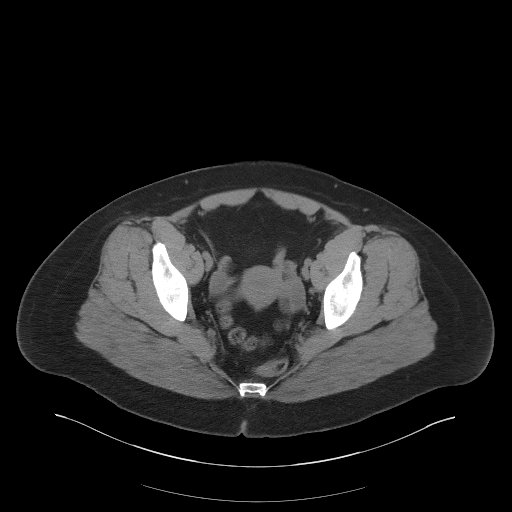
[im 27/96  soft-tissue]
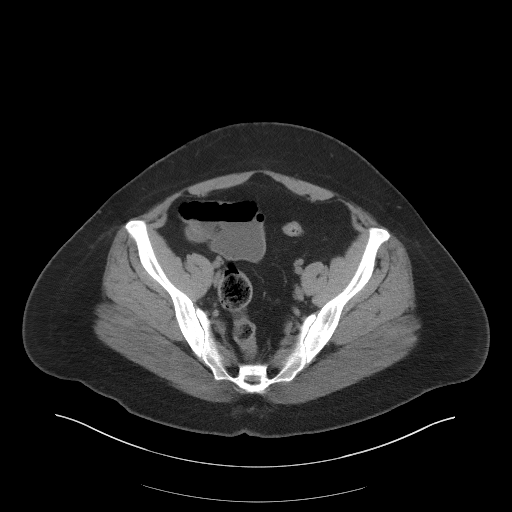
[im 35/96  soft-tissue]
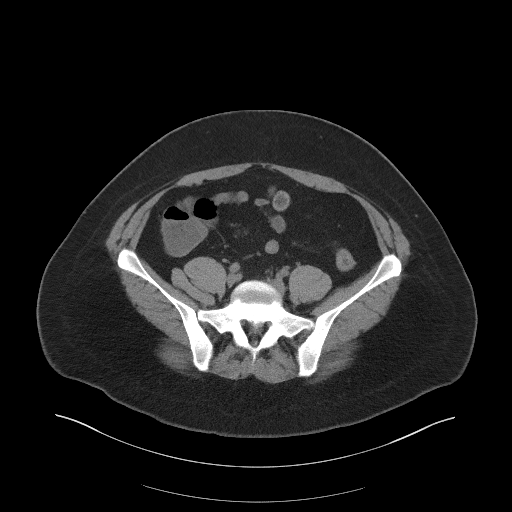
[im 42/96  soft-tissue]
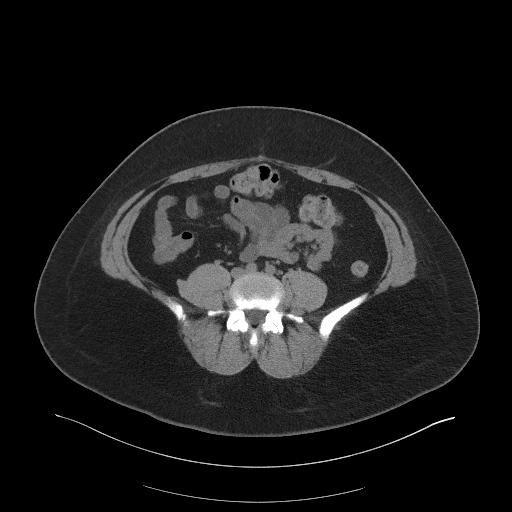
[im 50/96  soft-tissue]
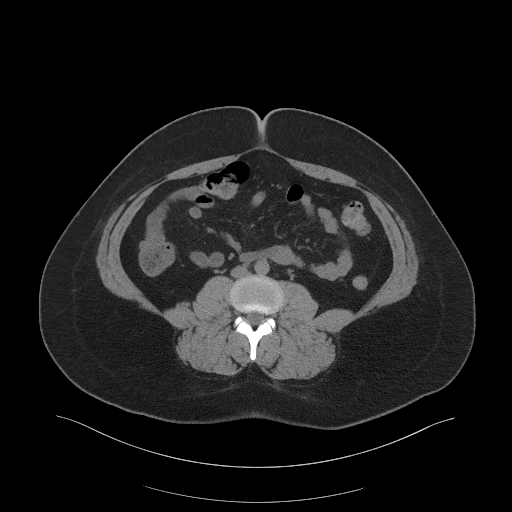
[im 54/96  soft-tissue]
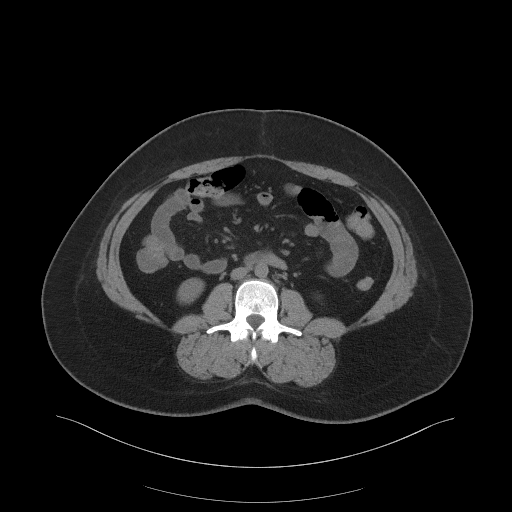
[im 61/96  soft-tissue]
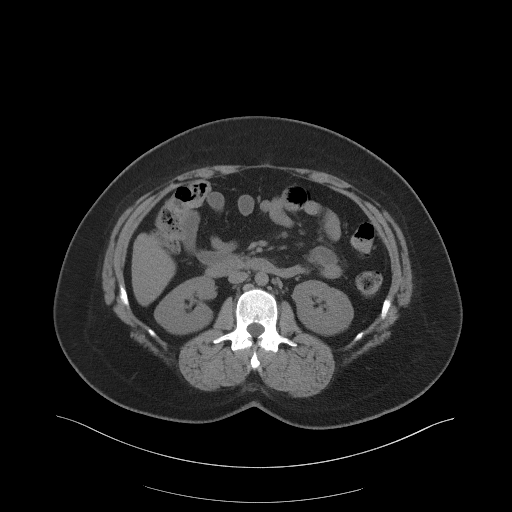
[im 61/96  bone]
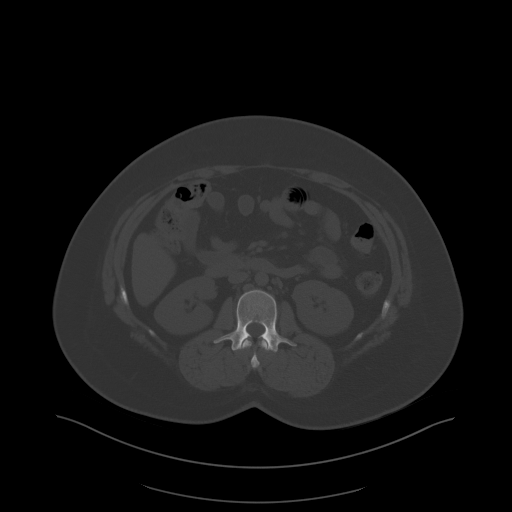
[im 69/96  soft-tissue]
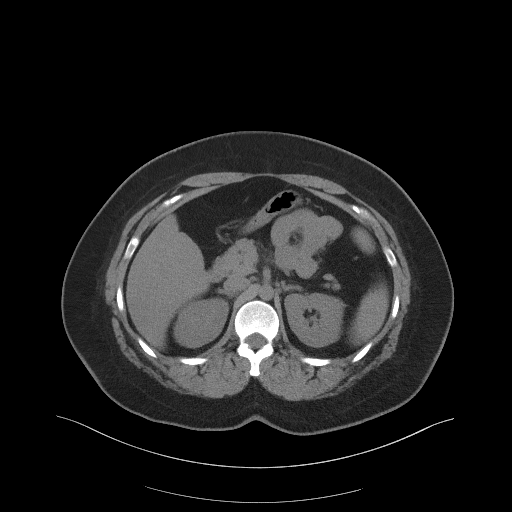
[im 77/96  soft-tissue]
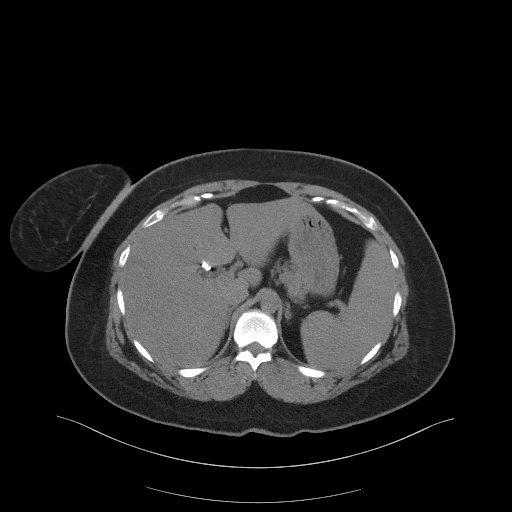
[im 84/96  soft-tissue]
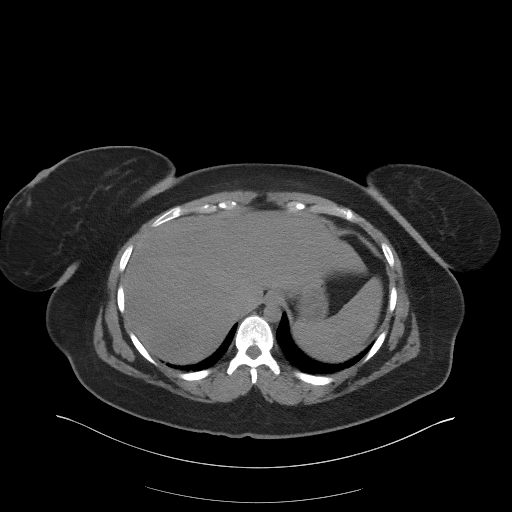
[im 92/96  soft-tissue]
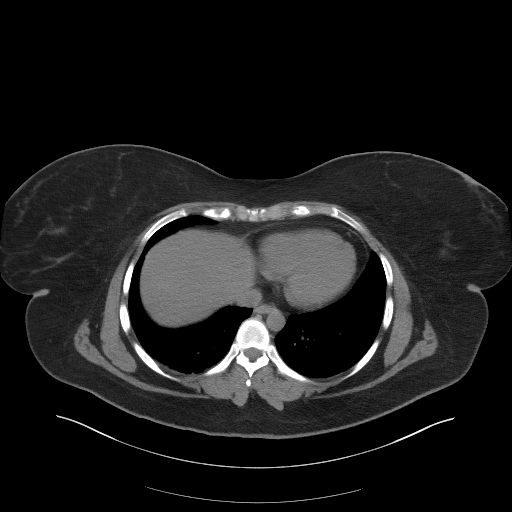

[Series 4: coronal st · coronal · 0.72mm/px · 3 of 104 slices shown]
[im 35/104  soft-tissue]
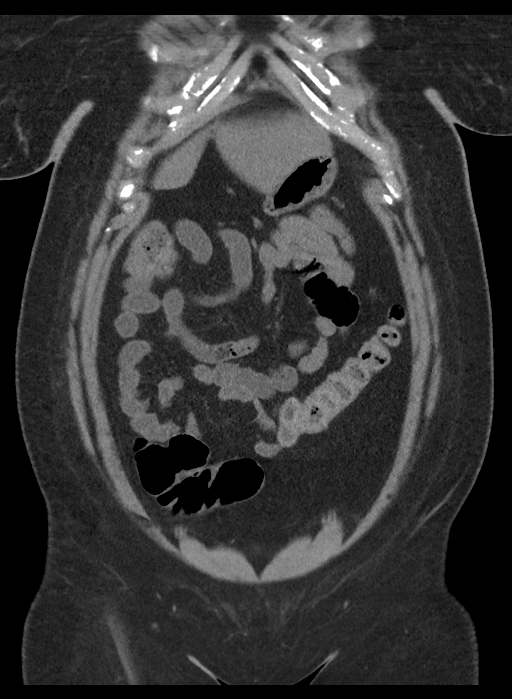
[im 46/104  soft-tissue]
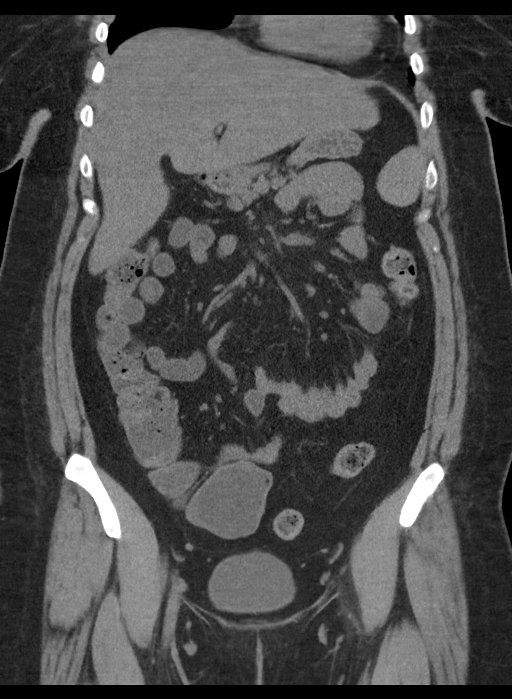
[im 58/104  soft-tissue]
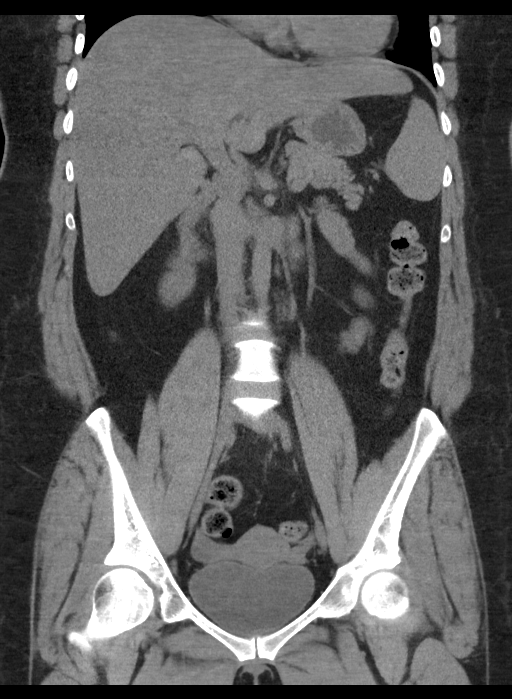

[16 of 46 positions shown; findings below may reference images not displayed]

FINDINGS: Lower chest: Normal heart size. Dependent atelectasis within the
bilateral lower lobes, right-greater-than-left.

Hepatobiliary: Liver is diffusely low in attenuation compatible with
steatosis. Patient status post cholecystectomy.

Pancreas: Unremarkable

Spleen: Spleen is mildly enlarged measuring 14 cm.

Adrenals/Urinary Tract: The adrenal glands are normal. Kidneys are
symmetric in size. No hydronephrosis. Urinary bladder is
unremarkable.

Stomach/Bowel: No abnormal bowel wall thickening or evidence for
bowel obstruction. No free fluid or free intraperitoneal air. Normal
morphology of the stomach.

Vascular/Lymphatic: Normal caliber abdominal aorta. No
retroperitoneal lymphadenopathy.

Reproductive: Uterus and adnexal structures are unremarkable.

Other: None.

Musculoskeletal: No aggressive or acute appearing osseous lesions.
IMPRESSION: No acute process within the abdomen or pelvis.

Hepatic steatosis.

## 2016-04-23 MED ORDER — SODIUM CHLORIDE 0.9 % IV BOLUS (SEPSIS)
2000.0000 mL | Freq: Once | INTRAVENOUS | Status: AC
  Administered 2016-04-23: 2000 mL via INTRAVENOUS

## 2016-04-23 MED ORDER — PROMETHAZINE HCL 25 MG RE SUPP: 25.0000 mg | Freq: Four times a day (QID) | RECTAL | 0 refills | Status: DC | PRN

## 2016-04-23 MED ORDER — PROMETHAZINE HCL 12.5 MG PO TABS
12.5000 mg | ORAL_TABLET | Freq: Once | ORAL | Status: AC
  Administered 2016-04-23: 12.5 mg via ORAL
  Filled 2016-04-23: qty 1

## 2016-04-23 MED ORDER — OXYCODONE-ACETAMINOPHEN 5-325 MG PO TABS
1.0000 | ORAL_TABLET | Freq: Once | ORAL | Status: AC
  Administered 2016-04-23: 1 via ORAL
  Filled 2016-04-23 (×2): qty 1

## 2016-04-23 MED ORDER — PROMETHAZINE HCL 25 MG/ML IJ SOLN
12.5000 mg | Freq: Once | INTRAMUSCULAR | Status: AC
  Administered 2016-04-23: 12.5 mg via INTRAVENOUS
  Filled 2016-04-23: qty 1

## 2016-04-23 MED ORDER — PROMETHAZINE HCL 25 MG PO TABS: 25.0000 mg | ORAL_TABLET | Freq: Four times a day (QID) | ORAL | 0 refills | Status: DC | PRN

## 2016-04-23 MED ORDER — MORPHINE SULFATE (PF) 4 MG/ML IV SOLN
6.0000 mg | Freq: Once | INTRAVENOUS | Status: AC
  Administered 2016-04-23: 6 mg via INTRAVENOUS
  Filled 2016-04-23: qty 2

## 2016-04-23 NOTE — ED Triage Notes (Signed)
Pt is here for abdominal pain which has been going on for about a week and became worse today.  Pt has been having nausea and vomiting with this.  Pt reports that she has vomited 10+times.  Pt states that her LBM was today and it was diarrhea.  No GU symptoms. Pt reports chronic UTI due to anatomy or urethra

## 2016-04-23 NOTE — ED Notes (Signed)
Ordered pain medication offered, pt states while her nausea is better she does not feel like she can keep this down. States her pain is better and she does not want to take pain medication at this time. Informed patient to let me know if she changes her mind

## 2016-04-23 NOTE — ED Provider Notes (Signed)
[facility] DEPT Provider Note   CSN: [id number] Arrival date & time: 04/23/16  1550     History   Chief Complaint Chief Complaint  Patient presents with  . Abdominal Pain    HPI Dana Mcpherson is a 25 y.o. female.   Emesis   This is a new problem. The current episode started 6 to 12 hours ago. The problem occurs 5 to 10 times per day. The problem has not changed since onset.The emesis has an appearance of stomach contents. There has been no fever. Associated symptoms include chills and sweats. Pertinent negatives include no cough and no diarrhea.    Past Medical History:  Diagnosis Date  . Allergy   . Anxiety   . Complication of anesthesia    woke up during surgery & ithing after surgery  . Migraine headache   . Nexplanon in place 10/05/2015   06/29/15 left arm  . Polycystic ovarian syndrome   . Pregnancy induced hypertension   . Tachycardia   . Vaginal Pap smear, abnormal     Patient Active Problem List   Diagnosis Date Noted  . Atypical pneumonia 02/19/2016  . Pulmonary infiltrates 02/18/2016  . Nexplanon in place 10/05/2015  . Hemorrhoid 06/29/2015  . Hydradenitis 06/23/2015  . Shortness of breath 11/04/2014  . Tachycardia 10/07/2014  . ASCUS with positive high risk HPV 05/28/2014  . Hx of preeclampsia, prior pregnancy, currently pregnant 04/22/2014  . PCOS (polycystic ovarian syndrome) 03/03/2014  . Hyperinsulinemia 03/03/2014  . Morbid obesity ([place]) 05/23/2013  . DUB (dysfunctional uterine bleeding) 05/23/2013    Past Surgical History:  Procedure Laterality Date  . CHOLECYSTECTOMY      OB History    Gravida Para Term Preterm AB Living   3 2 2   1 2    SAB TAB Ectopic Multiple Live Births   1     0 2      Obstetric Comments   IOL for pre-e       Home Medications    Prior to Admission medications   Medication Sig Start Date End Date Taking? Authorizing Provider  etonogestrel (NEXPLANON) 68 MG IMPL implant 1 each by Subdermal route once.    Yes Historical Provider, MD  ondansetron (ZOFRAN) 4 MG tablet Take 4 mg by mouth every 8 (eight) hours as needed for nausea or vomiting.   Yes Historical Provider, MD  PROAIR HFA 108 (90 Base) MCG/ACT inhaler Inhale 1-2 puffs into the lungs every 6 (six) hours as needed for wheezing. Reported on 10/05/2015 04/24/15  Yes Historical Provider, MD  azithromycin (ZITHROMAX) 500 MG tablet Take 1 tablet (500 mg total) by mouth daily. 02/19/16   Samuella Cota, MD  ibuprofen (ADVIL,MOTRIN) 800 MG tablet Take 800 mg by mouth daily as needed for mild pain or moderate pain. Reported on 06/23/2015 05/30/15   Historical Provider, MD  promethazine (PHENERGAN) 25 MG suppository Place 1 suppository (25 mg total) rectally every 6 (six) hours as needed for nausea or vomiting. 04/23/16   Merrily Pew, MD  promethazine (PHENERGAN) 25 MG tablet Take 1 tablet (25 mg total) by mouth every 6 (six) hours as needed for nausea or vomiting. 04/23/16   Merrily Pew, MD    Family History Family History  Problem Relation Age of Onset  . Cancer Paternal Grandmother     ovarian cancer  . Asthma Son   . ADD / ADHD Son     Social History Social History  Substance Use Topics  . Smoking status:  Former Smoker  . Smokeless tobacco: Never Used  . Alcohol use No     Comment: occasional     Allergies   Other; Shellfish allergy; Adhesive [tape]; Eggs or egg-derived products; and Latex   Review of Systems Review of Systems  Constitutional: Positive for chills.  Respiratory: Negative for cough.   Gastrointestinal: Positive for vomiting. Negative for diarrhea.  All other systems reviewed and are negative.    Physical Exam Updated Vital Signs BP 111/67   Pulse 108   Temp 98.2 F (36.8 C) (Oral)   Resp 18   Ht 5\' 6"  (1.676 m)   Wt 228 lb (103.4 kg)   SpO2 96%   BMI 36.80 kg/m   Physical Exam  Constitutional: She is oriented to person, place, and time. She appears well-developed and well-nourished.  HENT:    Head: Normocephalic and atraumatic.  Eyes: Conjunctivae and EOM are normal.  Neck: Normal range of motion.  Cardiovascular: Regular rhythm.  Tachycardia present.   Pulmonary/Chest: No stridor. No respiratory distress.  Abdominal: Soft. She exhibits no distension. There is tenderness (epigastric).  Musculoskeletal: Normal range of motion. She exhibits no edema or deformity.  Neurological: She is alert and oriented to person, place, and time. No cranial nerve deficit.  Skin: Skin is warm and dry.  Nursing note and vitals reviewed.    ED Treatments / Results  Labs (all labs ordered are listed, but only abnormal results are displayed) Labs Reviewed  COMPREHENSIVE METABOLIC PANEL - Abnormal; Notable for the following:       Result Value   Glucose, Bld 116 (*)    Total Protein 8.5 (*)    All other components within normal limits  CBC WITH DIFFERENTIAL/PLATELET - Abnormal; Notable for the following:    Neutro Abs 9.3 (*)    Lymphs Abs 0.5 (*)    All other components within normal limits  URINALYSIS, ROUTINE W REFLEX MICROSCOPIC - Abnormal; Notable for the following:    APPearance HAZY (*)    Hgb urine dipstick MODERATE (*)    Ketones, ur 20 (*)    Protein, ur 30 (*)    Leukocytes, UA TRACE (*)    Bacteria, UA FEW (*)    All other components within normal limits  LIPASE, BLOOD  POC URINE PREG, ED    EKG  EKG Interpretation None       Radiology Ct Renal Stone Study  Result Date: 04/23/2016 CLINICAL DATA:  Patient with diffuse abdominal pain. Prior cholecystectomy. EXAM: CT ABDOMEN AND PELVIS WITHOUT CONTRAST TECHNIQUE: Multidetector CT imaging of the abdomen and pelvis was performed following the standard protocol without IV contrast. COMPARISON:  Chest CT 02/18/2016. FINDINGS: Lower chest: Normal heart size. Dependent atelectasis within the bilateral lower lobes, right-greater-than-left. Hepatobiliary: Liver is diffusely low in attenuation compatible with steatosis. Patient  status post cholecystectomy. Pancreas: Unremarkable Spleen: Spleen is mildly enlarged measuring 14 cm. Adrenals/Urinary Tract: The adrenal glands are normal. Kidneys are symmetric in size. No hydronephrosis. Urinary bladder is unremarkable. Stomach/Bowel: No abnormal bowel wall thickening or evidence for bowel obstruction. No free fluid or free intraperitoneal air. Normal morphology of the stomach. Vascular/Lymphatic: Normal caliber abdominal aorta. No retroperitoneal lymphadenopathy. Reproductive: Uterus and adnexal structures are unremarkable. Other: None. Musculoskeletal: No aggressive or acute appearing osseous lesions. IMPRESSION: No acute process within the abdomen or pelvis. Hepatic steatosis. Electronically Signed   By: Lovey Newcomer M.D.   On: 04/23/2016 20:59    Procedures Procedures (including critical care time)  Medications Ordered  in ED Medications  morphine 4 MG/ML injection 6 mg (6 mg Intravenous Given 04/23/16 1707)  sodium chloride 0.9 % bolus 2,000 mL (0 mLs Intravenous Stopped 04/23/16 2221)  promethazine (PHENERGAN) injection 12.5 mg (12.5 mg Intravenous Given 04/23/16 1709)  oxyCODONE-acetaminophen (PERCOCET/ROXICET) 5-325 MG per tablet 1 tablet (1 tablet Oral Given 04/23/16 2221)  promethazine (PHENERGAN) tablet 12.5 mg (12.5 mg Oral Given 04/23/16 2013)     Initial Impression / Assessment and Plan / ED Course  I have reviewed the triage vital signs and the nursing notes.  Pertinent labs & imaging results that were available during my care of the patient were reviewed by me and considered in my medical decision making (see chart for details).  Clinical Course    Gastroenteritis vs UTI vs pancreatitis. Will evaluate appropriately.  States tachycardia is normal for her, however slightly dry on exam so will give fluids. Fluids/pain meds/anti-emetics for now.   Improved symptoms. Tolerating PO. Plan for dc.   Final Clinical Impressions(s) / ED Diagnoses   Final  diagnoses:  Abdominal pain, unspecified abdominal location  Nausea and vomiting, intractability of vomiting not specified, unspecified vomiting type    New Prescriptions Discharge Medication List as of 04/23/2016  9:48 PM    START taking these medications   Details  promethazine (PHENERGAN) 25 MG suppository Place 1 suppository (25 mg total) rectally every 6 (six) hours as needed for nausea or vomiting., Starting Sat 04/23/2016, Print    promethazine (PHENERGAN) 25 MG tablet Take 1 tablet (25 mg total) by mouth every 6 (six) hours as needed for nausea or vomiting., Starting Sat 04/23/2016, Print         Merrily Pew, MD 04/23/16 2356

## 2016-04-23 NOTE — ED Notes (Signed)
Pt states she is feeling better at this time.

## 2016-08-02 ENCOUNTER — Encounter (HOSPITAL_COMMUNITY): Payer: Self-pay | Admitting: Cardiology

## 2016-08-02 ENCOUNTER — Other Ambulatory Visit: Payer: Self-pay

## 2016-08-02 ENCOUNTER — Emergency Department (HOSPITAL_COMMUNITY): Payer: 59

## 2016-08-02 ENCOUNTER — Emergency Department (HOSPITAL_COMMUNITY)
Admission: EM | Admit: 2016-08-02 | Discharge: 2016-08-02 | Disposition: A | Discharge status: Home / Self Care | Payer: 59 | Attending: Emergency Medicine | Admitting: Emergency Medicine

## 2016-08-02 DIAGNOSIS — R000 Tachycardia, unspecified: Secondary | ICD-10-CM | POA: Diagnosis present

## 2016-08-02 DIAGNOSIS — Z87891 Personal history of nicotine dependence: Secondary | ICD-10-CM | POA: Diagnosis not present

## 2016-08-02 DIAGNOSIS — Z79899 Other long term (current) drug therapy: Secondary | ICD-10-CM | POA: Diagnosis not present

## 2016-08-02 DIAGNOSIS — R002 Palpitations: Secondary | ICD-10-CM | POA: Insufficient documentation

## 2016-08-02 LAB — CBC
HEMATOCRIT: 40 % (ref 36.0–46.0)
HEMOGLOBIN: 13.7 g/dL (ref 12.0–15.0)
MCH: 29.3 pg (ref 26.0–34.0)
MCHC: 34.3 g/dL (ref 30.0–36.0)
MCV: 85.5 fL (ref 78.0–100.0)
Platelets: 295 10*3/uL (ref 150–400)
RBC: 4.68 MIL/uL (ref 3.87–5.11)
RDW: 13.5 % (ref 11.5–15.5)
WBC: 12.6 10*3/uL — ABNORMAL HIGH (ref 4.0–10.5)

## 2016-08-02 LAB — BASIC METABOLIC PANEL
ANION GAP: 8 (ref 5–15)
BUN: 10 mg/dL (ref 6–20)
CALCIUM: 9.1 mg/dL (ref 8.9–10.3)
CHLORIDE: 103 mmol/L (ref 101–111)
CO2: 27 mmol/L (ref 22–32)
Creatinine, Ser: 0.74 mg/dL (ref 0.44–1.00)
GFR calc non Af Amer: >60 mL/min (ref >60)
GLUCOSE: 108 mg/dL — AB (ref 65–99)
Potassium: 3.6 mmol/L (ref 3.5–5.1)
Sodium: 138 mmol/L (ref 135–145)

## 2016-08-02 LAB — PREGNANCY, URINE: Preg Test, Ur: NEGATIVE

## 2016-08-02 LAB — TROPONIN I

## 2016-08-02 LAB — D-DIMER, QUANTITATIVE: D-Dimer, Quant: <0.27 ug/mL-FEU (ref 0.00–0.50)

## 2016-08-02 IMAGING — DX DG CHEST 2V
2 series · 2 of 2 positions shown · non-contrast
Comparison: PA and lateral chest [DATE] and [DATE]. CT
chest [DATE].

CLINICAL DATA: Tachycardia for 2 years.

EXAM:
CHEST  2 VIEW

[chest lat]
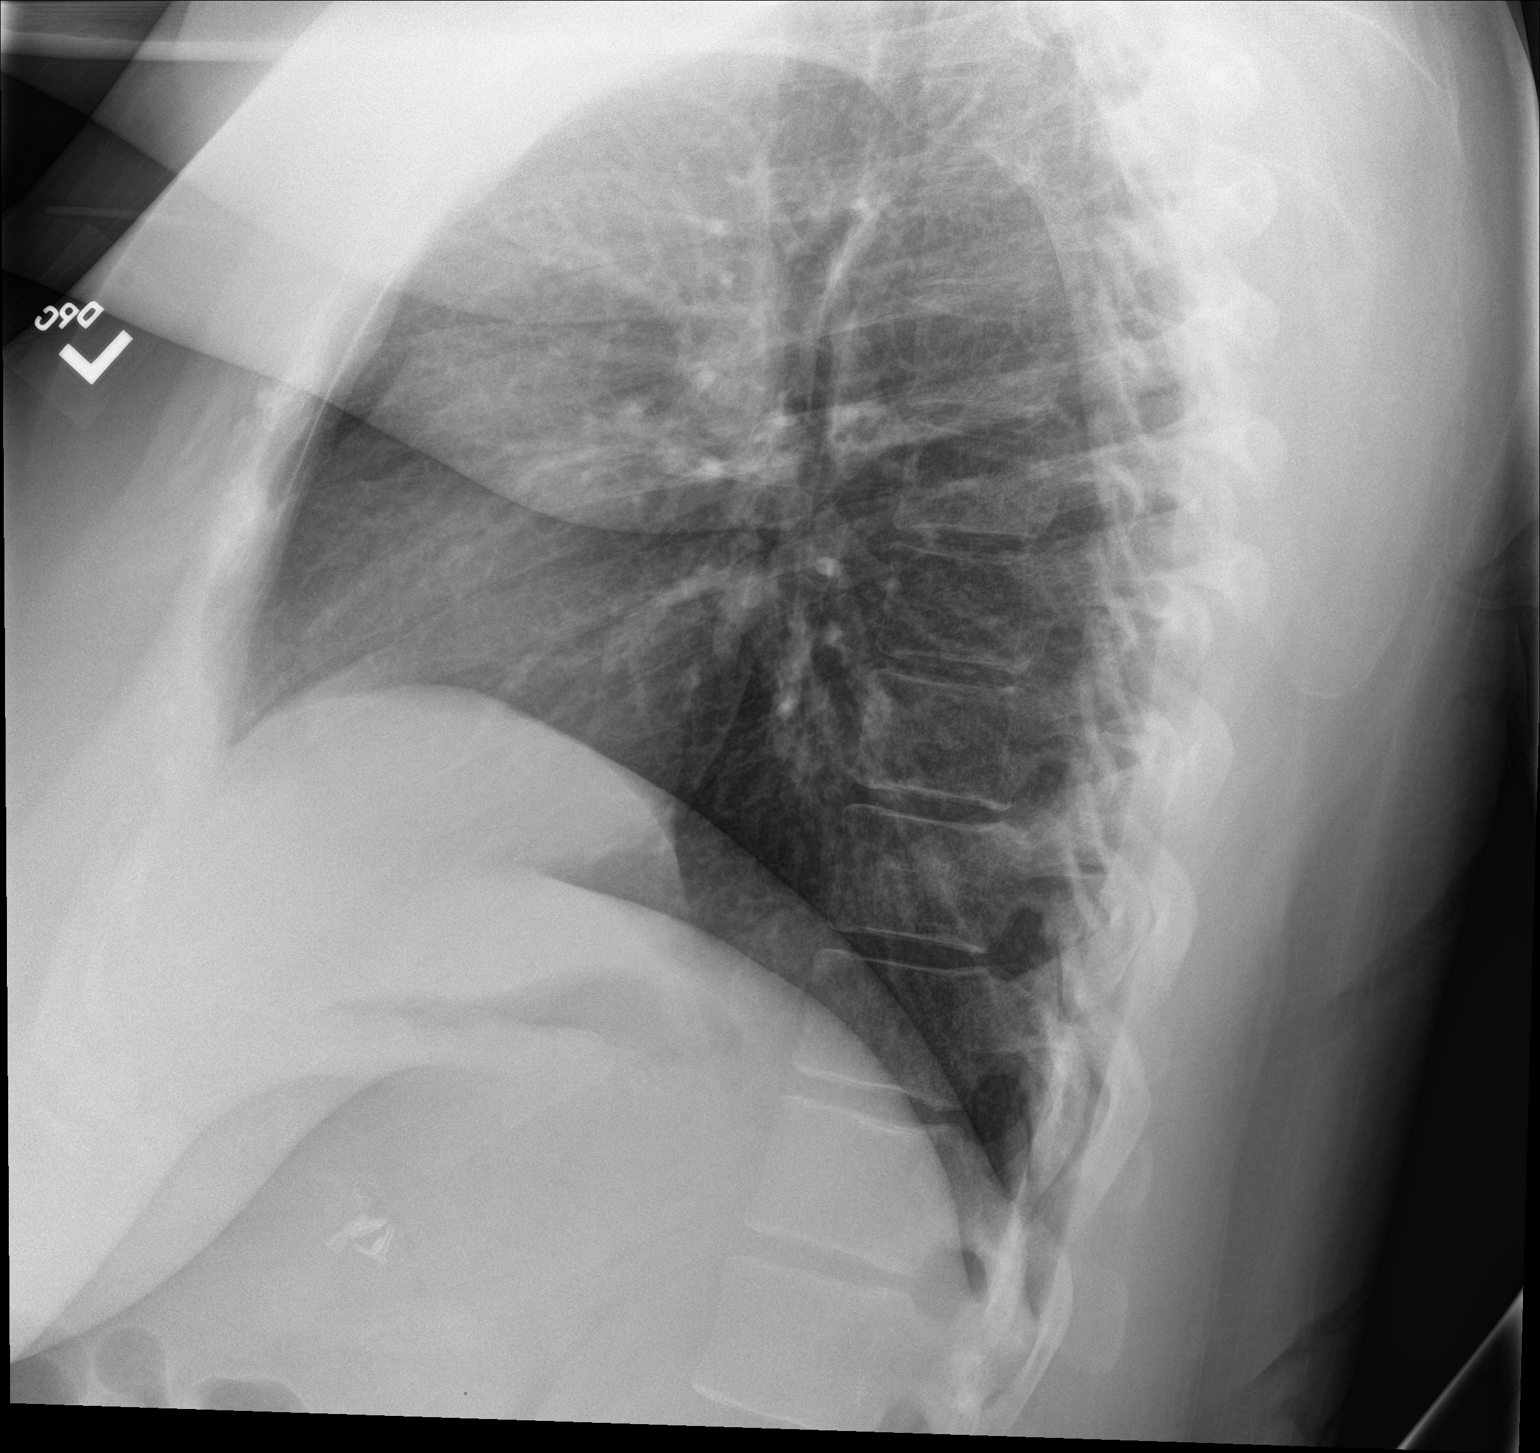

[chest ap]
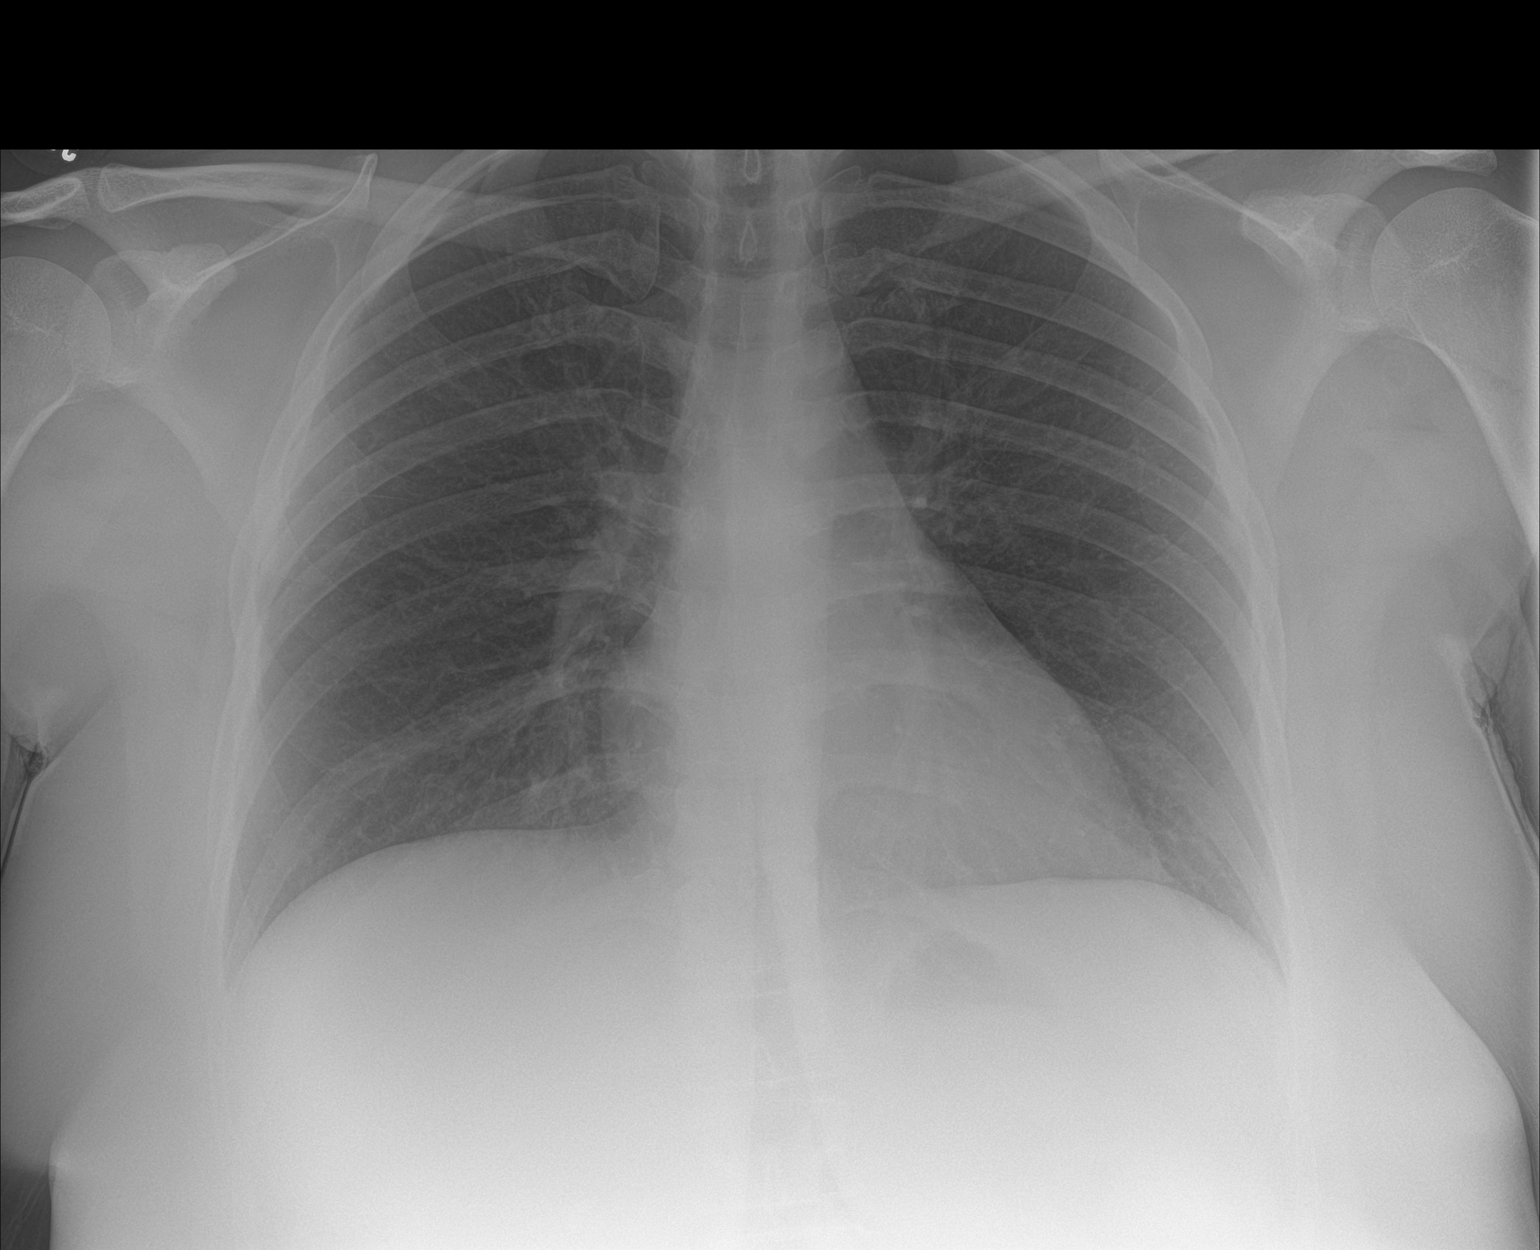

[2 of 2 positions shown; findings below may reference images not displayed]

FINDINGS: The lungs are clear. Heart size is normal. No pneumothorax or
pleural effusion. No bony abnormality.
IMPRESSION: Negative chest.

## 2016-08-02 MED ORDER — DIPHENHYDRAMINE HCL 25 MG PO CAPS
25.0000 mg | ORAL_CAPSULE | Freq: Once | ORAL | Status: AC
  Administered 2016-08-02: 25 mg via ORAL
  Filled 2016-08-02: qty 1

## 2016-08-02 NOTE — ED Triage Notes (Signed)
c/o feeling like her heart is racing and feeling like she is going to pass out today.  Has had been seen for the same in the past.

## 2016-08-02 NOTE — ED Provider Notes (Signed)
Emergency Department Provider Note   I have reviewed the triage vital signs and the nursing notes.  By signing my name below, I, Dana Mcpherson, attest that this documentation has been prepared under the direction and in the presence of [name], MD. Electronically Signed: Macon Mcpherson, ED Scribe. 08/02/16. 9:03 PM.  HISTORY  Chief Complaint Tachycardia   HPI Dana Mcpherson is a 26 y.o. female with PMHx of tachycardia who presents to the Emergency Department complaining of moderate, intermittent, generalized chest pain onset earlier today. Pt reports associated episodic palpitations accompanied by a near-like syncope feeling. Per pt, she called her PCP and was told to come to the ED for further evaluation. She states " I can feel my heartbeat in my ears". During her episodes, pt notes feeling "hot and my head felt numb with a tingle". She states this is a new symptom. Pt notes her palpitations are worsened with any exertion or activity. She reports having a recorded heart rate of 160 at home. Pt notes having a h/o of episodic palpitations ~2 years now, without being followed up by a cardiologist. No alleviating factors noted. Pt denies leg swelling.   Past Medical History:  Diagnosis Date  . Allergy   . Anxiety   . Complication of anesthesia    woke up during surgery & ithing after surgery  . Migraine headache   . Nexplanon in place 10/05/2015   06/29/15 left arm  . Polycystic ovarian syndrome   . Pregnancy induced hypertension   . Tachycardia   . Vaginal Pap smear, abnormal     Patient Active Problem List   Diagnosis Date Noted  . Atypical pneumonia 02/19/2016  . Pulmonary infiltrates 02/18/2016  . Nexplanon in place 10/05/2015  . Hemorrhoid 06/29/2015  . Hydradenitis 06/23/2015  . Shortness of breath 11/04/2014  . Tachycardia 10/07/2014  . ASCUS with positive high risk HPV 05/28/2014  . Hx of preeclampsia, prior pregnancy, currently pregnant 04/22/2014  .  PCOS (polycystic ovarian syndrome) 03/03/2014  . Hyperinsulinemia 03/03/2014  . Morbid obesity ([place]) 05/23/2013  . DUB (dysfunctional uterine bleeding) 05/23/2013    Past Surgical History:  Procedure Laterality Date  . CHOLECYSTECTOMY      Current Outpatient Rx  . Order #: [id number] Class: Historical Med  . Order #: [id number] Class: Historical Med  . Order #: [id number] Class: Historical Med    Allergies Other; Shellfish allergy; Adhesive [tape]; Eggs or egg-derived products; and Latex  Family History  Problem Relation Age of Onset  . Cancer Paternal Grandmother     ovarian cancer  . Asthma Son   . ADD / ADHD Son     Social History Social History  Substance Use Topics  . Smoking status: Former Research scientist (life sciences)  . Smokeless tobacco: Never Used  . Alcohol use No     Comment: occasional    Review of Systems  Constitutional: No fever/chills Eyes: No visual changes. ENT: No sore throat. Cardiovascular: Positive palpitations.  Respiratory: Denies shortness of breath. Gastrointestinal: No abdominal pain.  No nausea, no vomiting.  No diarrhea.  No constipation. Genitourinary: Negative for dysuria. Musculoskeletal: Negative for back pain. Skin: Negative for rash. Neurological: Negative for headaches, focal weakness or numbness.  10-point ROS otherwise negative.  ____________________________________________   PHYSICAL EXAM:  VITAL SIGNS: ED Triage Vitals  Enc Vitals Group     BP 08/02/16 1713 136/83     Pulse Rate 08/02/16 1713 (!) 114     Resp 08/02/16 1713 18     Temp  08/02/16 1713 98.1 F (36.7 C)     Temp Source 08/02/16 1713 Oral     SpO2 08/02/16 1713 99 %     Weight 08/02/16 1711 226 lb (102.5 kg)     Height 08/02/16 1711 5' 6.5" (1.689 m)    Constitutional: Alert and oriented. Well appearing and in no acute distress. Eyes: Conjunctivae are normal.  Head: Atraumatic. Nose: No congestion/rhinnorhea. Mouth/Throat: Mucous membranes are moist.  Oropharynx  non-erythematous. Neck: No stridor.   Cardiovascular: Normal rate, regular rhythm. Good peripheral circulation. Grossly normal heart sounds.   Respiratory: Normal respiratory effort.  No retractions. Lungs CTAB. Gastrointestinal: Soft and nontender. No distention.  Musculoskeletal: No lower extremity tenderness nor edema. No gross deformities of extremities. Neurologic:  Normal speech and language. No gross focal neurologic deficits are appreciated.  Skin:  Skin is warm, dry and intact. No rash noted. Psychiatric: Mood and affect are normal. Speech and behavior are normal.  ____________________________________________   LABS (all labs ordered are listed, but only abnormal results are displayed)  Labs Reviewed  BASIC METABOLIC PANEL - Abnormal; Notable for the following:       Result Value   Glucose, Bld 108 (*)    All other components within normal limits  CBC - Abnormal; Notable for the following:    WBC 12.6 (*)    All other components within normal limits  TROPONIN I  PREGNANCY, URINE  D-DIMER, QUANTITATIVE (NOT AT [facility])   ____________________________________________  EKG   EKG Interpretation  Date/Time:  Tuesday August 02 2016 20:59:29 EDT Ventricular Rate:  95 PR Interval:    QRS Duration: 88 QT Interval:  358 QTC Calculation: 450 R Axis:   53 Text Interpretation:  Sinus rhythm Low voltage, precordial leads No STEMI.  Confirmed by Jillienne Egner MD, Emilynn Srinivasan [phone]) on 08/02/2016 9:21:00 PM       ____________________________________________  RADIOLOGY  Dg Chest 2 View  Result Date: 08/02/2016 CLINICAL DATA:  Tachycardia for 2 years. EXAM: CHEST  2 VIEW COMPARISON:  PA and lateral chest 03/14/2016 and 10/18/2010. CT chest 02/18/2016. FINDINGS: The lungs are clear. Heart size is normal. No pneumothorax or pleural effusion. No bony abnormality. IMPRESSION: Negative chest. Electronically Signed   By: Inge Rise M.D.   On: 08/02/2016 17:51     ____________________________________________   PROCEDURES  Procedure(s) performed:   Procedures  None ____________________________________________   INITIAL IMPRESSION / ASSESSMENT AND PLAN / ED COURSE  Pertinent labs & imaging results that were available during my care of the patient were reviewed by me and considered in my medical decision making (see chart for details).  Patient resents to the emergency department for evaluation of intermittent tachycardia, worse with movement. Symptoms have been ongoing for 2 years but worse over the last several days. Labs from triage are normal. CXR with no acute findings. Plan for d-dimer with tachycardia on arrival and low pre-test probability for PE. No CP. Will reassess afterwards.   D-dimer negative. Patient with sinus rhythm in the 90s to occasional low 100s. Normal orthostatic vital signs. Discussed PCP follow up for further testing and consideration of thyroid testing. Also discussed Cardiology re-evaluation as symptoms are now occurring independent of pregnancy.   At this time, I do not feel there is any life-threatening condition present. I have reviewed and discussed all results (EKG, imaging, lab, urine as appropriate), exam findings with patient. I have reviewed nursing notes and appropriate previous records.  I feel the patient is safe to be discharged home without  further emergent workup. Discussed usual and customary return precautions. Patient and family (if present) verbalize understanding and are comfortable with this plan.  Patient will follow-up with their primary care provider. If they do not have a primary care provider, information for follow-up has been provided to them. All questions have been answered.  ____________________________________________  FINAL CLINICAL IMPRESSION(S) / ED DIAGNOSES  Final diagnoses:  Palpitations     MEDICATIONS GIVEN DURING THIS VISIT:  Medications  diphenhydrAMINE (BENADRYL)  capsule 25 mg (25 mg Oral Given 08/02/16 2058)     NEW OUTPATIENT MEDICATIONS STARTED DURING THIS VISIT:  None   Note:  This document was prepared using Dragon voice recognition software and may include unintentional dictation errors.  Nanda Quinton, MD Emergency Medicine  I personally performed the services described in this documentation, which was scribed in my presence. The recorded information has been reviewed and is accurate.       [name], MD 08/02/16 [phone]

## 2016-08-02 NOTE — ED Triage Notes (Signed)
Rechecked pt.  Pt crying and holding her head.  Heart rate 96

## 2016-08-02 NOTE — Discharge Instructions (Signed)
You were seen in the ED today with palpitations (racing heartbeat). Your EKG and labs were normal. We would like for you to discuss further with your PCP and decided with Cardiology follow is the next best step. You may need to also discuss thyroid testing with your PCP.   Return to the ED with any difficulty breathing, chest pain, or additional passing out.

## 2016-08-18 ENCOUNTER — Ambulatory Visit (INDEPENDENT_AMBULATORY_CARE_PROVIDER_SITE_OTHER): Payer: 59 | Admitting: Cardiovascular Disease

## 2016-08-18 ENCOUNTER — Encounter: Payer: Self-pay | Admitting: *Deleted

## 2016-08-18 ENCOUNTER — Encounter: Payer: Self-pay | Admitting: Cardiovascular Disease

## 2016-08-18 ENCOUNTER — Telehealth: Payer: Self-pay | Admitting: Cardiovascular Disease

## 2016-08-18 VITALS — BP 118/74 | HR 105 | Ht 66.0 in | Wt 226.0 lb

## 2016-08-18 DIAGNOSIS — R0609 Other forms of dyspnea: Secondary | ICD-10-CM

## 2016-08-18 DIAGNOSIS — R002 Palpitations: Secondary | ICD-10-CM | POA: Diagnosis not present

## 2016-08-18 DIAGNOSIS — R000 Tachycardia, unspecified: Secondary | ICD-10-CM | POA: Diagnosis not present

## 2016-08-18 DIAGNOSIS — Z9289 Personal history of other medical treatment: Secondary | ICD-10-CM | POA: Diagnosis not present

## 2016-08-18 DIAGNOSIS — M7989 Other specified soft tissue disorders: Secondary | ICD-10-CM | POA: Diagnosis not present

## 2016-08-18 MED ORDER — METOPROLOL TARTRATE 25 MG PO TABS: 12.5000 mg | ORAL_TABLET | Freq: Two times a day (BID) | ORAL | 6 refills | Status: DC

## 2016-08-18 NOTE — Telephone Encounter (Signed)
Pre-cert Verification for the following procedure    Echo scheduled for 09/08/16 at [place]

## 2016-08-18 NOTE — Patient Instructions (Signed)
Medication Instructions:   Begin Metoprolol tart 12.5mg  twice a day.  Continue all other medications.    Labwork: none  Testing/Procedures:  Your physician has requested that you have an echocardiogram. Echocardiography is a painless test that uses sound waves to create images of your heart. It provides your doctor with information about the size and shape of your heart and how well your heart's chambers and valves are working. This procedure takes approximately one hour. There are no restrictions for this procedure.  Office will contact with results via phone or letter.    Follow-Up: 2 months   Any Other Special Instructions Will Be Listed Below (If Applicable).  If you need a refill on your cardiac medications before your next appointment, please call your pharmacy.

## 2016-08-18 NOTE — Progress Notes (Signed)
SUBJECTIVE: The patient presents for follow-up of tachycardia. She was last evaluated by Dr. Harrington Challenger on 10/10/14. This is my first time meeting her. It appeared to be a sinus tachycardia at that time. Echocardiogram on 10/13/14 was normal. There was a trivial pericardial effusion.  She was evaluated in the ED on 08/02/16 for tachycardia and generalized chest pain. I personally reviewed all labs, studies, and documentation.  D-dimer and troponin were normal. White blood cells were mildly elevated at 12.6. Sodium, potassium, and renal function were normal.  Chest x-ray was normal.  I independently reviewed the ECG which demonstrated sinus rhythm, 95 bpm.  She feels exhausted with minimal activities around the house such as taking care of her baby son. She is very fatigued with exertion. She denies exertional chest pain. She does have significant exertional dyspnea. She has had several near syncopal episodes. The most recent one occurred when she was cleaning her car and temperatures were in the 80s. Symptoms took 2 days to resolve.  She has had b/l hand and feet swelling lately.  She denies a history of ear infections. She has a sinus infection once year. She ruptured her right eardrum as a child and has had some hearing loss with that.  I will order a copy of labs from her PCP which included a lipid panel and TSH.  When she was pregnant she was tried on labetalol which significantly dropped her blood pressure. She drinks 8-9 bottles of water daily. She likes to walk.  Review of Systems: As per "subjective", otherwise negative.  Allergies  Allergen Reactions  . Other Anaphylaxis and Other (See Comments)    Pt states that she is allergic to Axe/Tag body spray and Lysol.    . Shellfish Allergy Anaphylaxis and Hives  . Adhesive [Tape] Hives  . Eggs Or Egg-Derived Products Itching and Nausea Only  . Latex Itching    Current Outpatient Prescriptions  Medication Sig Dispense Refill  .  etonogestrel (NEXPLANON) 68 MG IMPL implant 1 each by Subdermal route once.     No current facility-administered medications for this visit.     Past Medical History:  Diagnosis Date  . Allergy   . Anxiety   . Complication of anesthesia    woke up during surgery & ithing after surgery  . Migraine headache   . Nexplanon in place 10/05/2015   06/29/15 left arm  . Polycystic ovarian syndrome   . Pregnancy induced hypertension   . Tachycardia   . Vaginal Pap smear, abnormal     Past Surgical History:  Procedure Laterality Date  . CHOLECYSTECTOMY      Social History   Social History  . Marital status: Married    Spouse name: N/A  . Number of children: N/A  . Years of education: N/A   Occupational History  . Not on file.   Social History Main Topics  . Smoking status: Former Research scientist (life sciences)  . Smokeless tobacco: Never Used  . Alcohol use No     Comment: occasional  . Drug use: No  . Sexual activity: Not Currently    Birth control/ protection: None, Implant   Other Topics Concern  . Not on file   Social History Narrative  . No narrative on file     Vitals:   08/18/16 1031  BP: 118/74  Pulse: (!) 105  SpO2: 98%  Weight: 226 lb (102.5 kg)  Height: 5\' 6"  (1.676 m)    Wt Readings from Last 3  Encounters:  08/18/16 226 lb (102.5 kg)  08/02/16 226 lb (102.5 kg)  04/23/16 228 lb (103.4 kg)     PHYSICAL EXAM General: NAD HEENT: Normal. Neck: No JVD, no thyromegaly. Lungs: Clear to auscultation bilaterally with normal respiratory effort. CV: Nondisplaced PMI.  Tachycardic, regular rhythm, normal S1/S2, no S3/S4, no murmur. No pretibial or periankle edema.  No carotid bruit.   Abdomen: Soft, nontender, obese.  Neurologic: Alert and oriented.  Psych: Normal affect. Skin: Normal. Musculoskeletal: No gross deformities.    ECG: Most recent ECG reviewed.   Labs: Lab Results  Component Value Date/Time   K 3.6 08/02/2016 05:39 PM   BUN 10 08/02/2016 05:39 PM    CREATININE 0.74 08/02/2016 05:39 PM   CREATININE 0.62 05/02/2013 03:40 PM   ALT 46 04/23/2016 05:03 PM   TSH 2.417 05/02/2013 03:40 PM   HGB 13.7 08/02/2016 05:39 PM     Lipids: No results found for: LDLCALC, LDLDIRECT, CHOL, TRIG, HDL     ASSESSMENT AND PLAN: 1. Tachycardia/palpitations with dyspnea with minimal exertion: I will attempt low-dose metoprolol tartrate 12.5 mg twice daily. She appears to be adequately hydrating herself. There is no sign of anemia. I will obtain labs from her PCP to see what her TSH was.  2. Bilateral hand/feet swelling: I will order a 2-D echocardiogram with Doppler to evaluate cardiac structure, function, and regional wall motion.   Time spent: 40 minutes, of which greater than 50% was spent reviewing symptoms, relevant blood tests and studies, and discussing management plan with the patient.   Disposition: Follow up 2 months  Kate Sable, M.D., F.A.C.C.

## 2016-08-23 ENCOUNTER — Telehealth: Payer: Self-pay | Admitting: Cardiovascular Disease

## 2016-08-23 NOTE — Telephone Encounter (Signed)
Returning call.

## 2016-08-23 NOTE — Telephone Encounter (Signed)
Notes recorded by Laurine Blazer, LPN on 12/28/1738 at 8:14 AM EDT Patient notified. Stated that her pmd called her & told her to start taking Fish Oil. Follow up scheduled for June with Dr. Bronson Ing. ------  Notes recorded by Laurine Blazer, LPN on 08/01/1854 at 3:14 AM EDT Left message to return call.  ------  Notes recorded by Herminio Commons, MD on 08/22/2016 at 9:36 AM EDT Cholesterol mildly elevated. Needs dietary and exercise modification.

## 2016-09-08 ENCOUNTER — Other Ambulatory Visit: Payer: Self-pay

## 2016-09-08 ENCOUNTER — Ambulatory Visit (INDEPENDENT_AMBULATORY_CARE_PROVIDER_SITE_OTHER): Payer: 59

## 2016-09-08 DIAGNOSIS — R000 Tachycardia, unspecified: Secondary | ICD-10-CM

## 2016-09-08 DIAGNOSIS — R002 Palpitations: Secondary | ICD-10-CM | POA: Diagnosis not present

## 2016-09-09 ENCOUNTER — Telehealth: Payer: Self-pay | Admitting: *Deleted

## 2016-09-09 NOTE — Telephone Encounter (Signed)
Notes recorded by Laurine Blazer, LPN on 7/40/8144 at 8:18 PM EDT Patient notified. Copy to pmd. Follow up scheduled for 10/18/2016 with Dr. Bronson Ing. ------  Notes recorded by Herminio Commons, MD on 09/08/2016 at 12:06 PM EDT Normal pumping function.

## 2016-10-18 ENCOUNTER — Encounter: Payer: Self-pay | Admitting: Cardiovascular Disease

## 2016-10-18 ENCOUNTER — Ambulatory Visit (INDEPENDENT_AMBULATORY_CARE_PROVIDER_SITE_OTHER): Payer: 59 | Admitting: Cardiovascular Disease

## 2016-10-18 VITALS — BP 134/80 | HR 87 | Ht 66.5 in | Wt 229.0 lb

## 2016-10-18 DIAGNOSIS — M7989 Other specified soft tissue disorders: Secondary | ICD-10-CM | POA: Diagnosis not present

## 2016-10-18 DIAGNOSIS — R000 Tachycardia, unspecified: Secondary | ICD-10-CM | POA: Diagnosis not present

## 2016-10-18 DIAGNOSIS — R0609 Other forms of dyspnea: Secondary | ICD-10-CM

## 2016-10-18 DIAGNOSIS — E282 Polycystic ovarian syndrome: Secondary | ICD-10-CM

## 2016-10-18 DIAGNOSIS — M542 Cervicalgia: Secondary | ICD-10-CM

## 2016-10-18 DIAGNOSIS — R002 Palpitations: Secondary | ICD-10-CM

## 2016-10-18 NOTE — Patient Instructions (Signed)
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
## 2016-11-21 ENCOUNTER — Emergency Department (HOSPITAL_COMMUNITY)
Admission: EM | Admit: 2016-11-21 | Discharge: 2016-11-21 | Disposition: A | Discharge status: Home / Self Care | Payer: 59 | Attending: Emergency Medicine | Admitting: Emergency Medicine

## 2016-11-21 ENCOUNTER — Emergency Department (HOSPITAL_COMMUNITY): Payer: 59

## 2016-11-21 ENCOUNTER — Telehealth: Payer: Self-pay | Admitting: *Deleted

## 2016-11-21 ENCOUNTER — Encounter (HOSPITAL_COMMUNITY): Payer: Self-pay | Admitting: Emergency Medicine

## 2016-11-21 DIAGNOSIS — R51 Headache: Secondary | ICD-10-CM | POA: Diagnosis not present

## 2016-11-21 DIAGNOSIS — R42 Dizziness and giddiness: Secondary | ICD-10-CM | POA: Diagnosis not present

## 2016-11-21 DIAGNOSIS — R002 Palpitations: Secondary | ICD-10-CM | POA: Insufficient documentation

## 2016-11-21 DIAGNOSIS — R110 Nausea: Secondary | ICD-10-CM | POA: Diagnosis not present

## 2016-11-21 DIAGNOSIS — Z91040 Latex allergy status: Secondary | ICD-10-CM | POA: Diagnosis not present

## 2016-11-21 DIAGNOSIS — Z79899 Other long term (current) drug therapy: Secondary | ICD-10-CM | POA: Diagnosis not present

## 2016-11-21 DIAGNOSIS — R0789 Other chest pain: Secondary | ICD-10-CM | POA: Diagnosis present

## 2016-11-21 LAB — BASIC METABOLIC PANEL
ANION GAP: 12 (ref 5–15)
BUN: 9 mg/dL (ref 6–20)
CALCIUM: 9.4 mg/dL (ref 8.9–10.3)
CO2: 24 mmol/L (ref 22–32)
CREATININE: 0.81 mg/dL (ref 0.44–1.00)
Chloride: 102 mmol/L (ref 101–111)
GFR calc Af Amer: >60 mL/min (ref >60)
GFR calc non Af Amer: >60 mL/min (ref >60)
GLUCOSE: 93 mg/dL (ref 65–99)
Potassium: 3.7 mmol/L (ref 3.5–5.1)
Sodium: 138 mmol/L (ref 135–145)

## 2016-11-21 LAB — CBC
HCT: 42.5 % (ref 36.0–46.0)
HEMOGLOBIN: 14.5 g/dL (ref 12.0–15.0)
MCH: 29.2 pg (ref 26.0–34.0)
MCHC: 34.1 g/dL (ref 30.0–36.0)
MCV: 85.5 fL (ref 78.0–100.0)
Platelets: 300 10*3/uL (ref 150–400)
RBC: 4.97 MIL/uL (ref 3.87–5.11)
RDW: 13.1 % (ref 11.5–15.5)
WBC: 9 10*3/uL (ref 4.0–10.5)

## 2016-11-21 LAB — I-STAT TROPONIN, ED: TROPONIN I, POC: 0 ng/mL (ref 0.00–0.08)

## 2016-11-21 IMAGING — DX DG CHEST 2V
2 series · 2 of 2 positions shown · non-contrast
Comparison: Chest x-ray of [DATE].

CLINICAL DATA: Mid and left-sided chest discomfort and shortness of
breath for the past 20 hours or so. Former smoker history of
tachycardia, morbid obesity.

EXAM:
CHEST  2 VIEW

[chest pa]
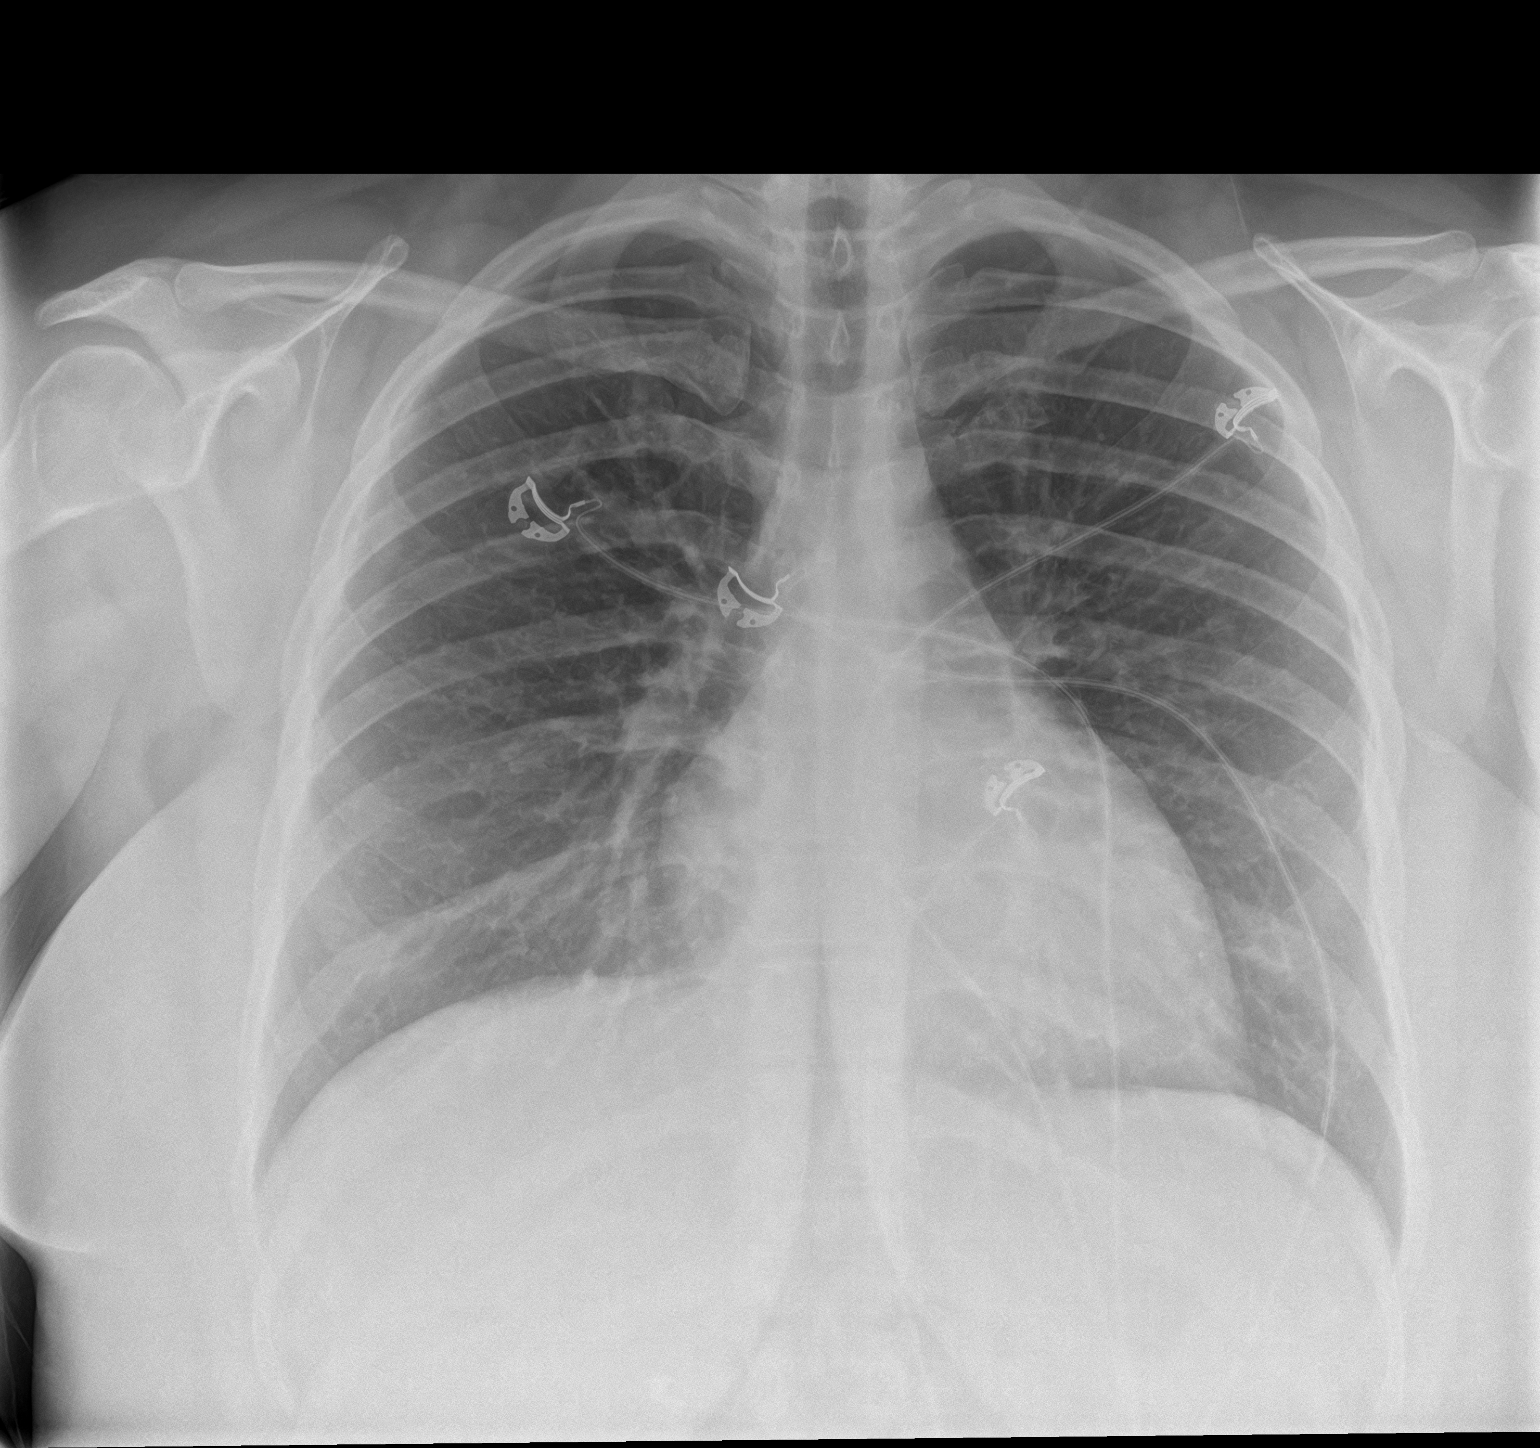

[chest lat]
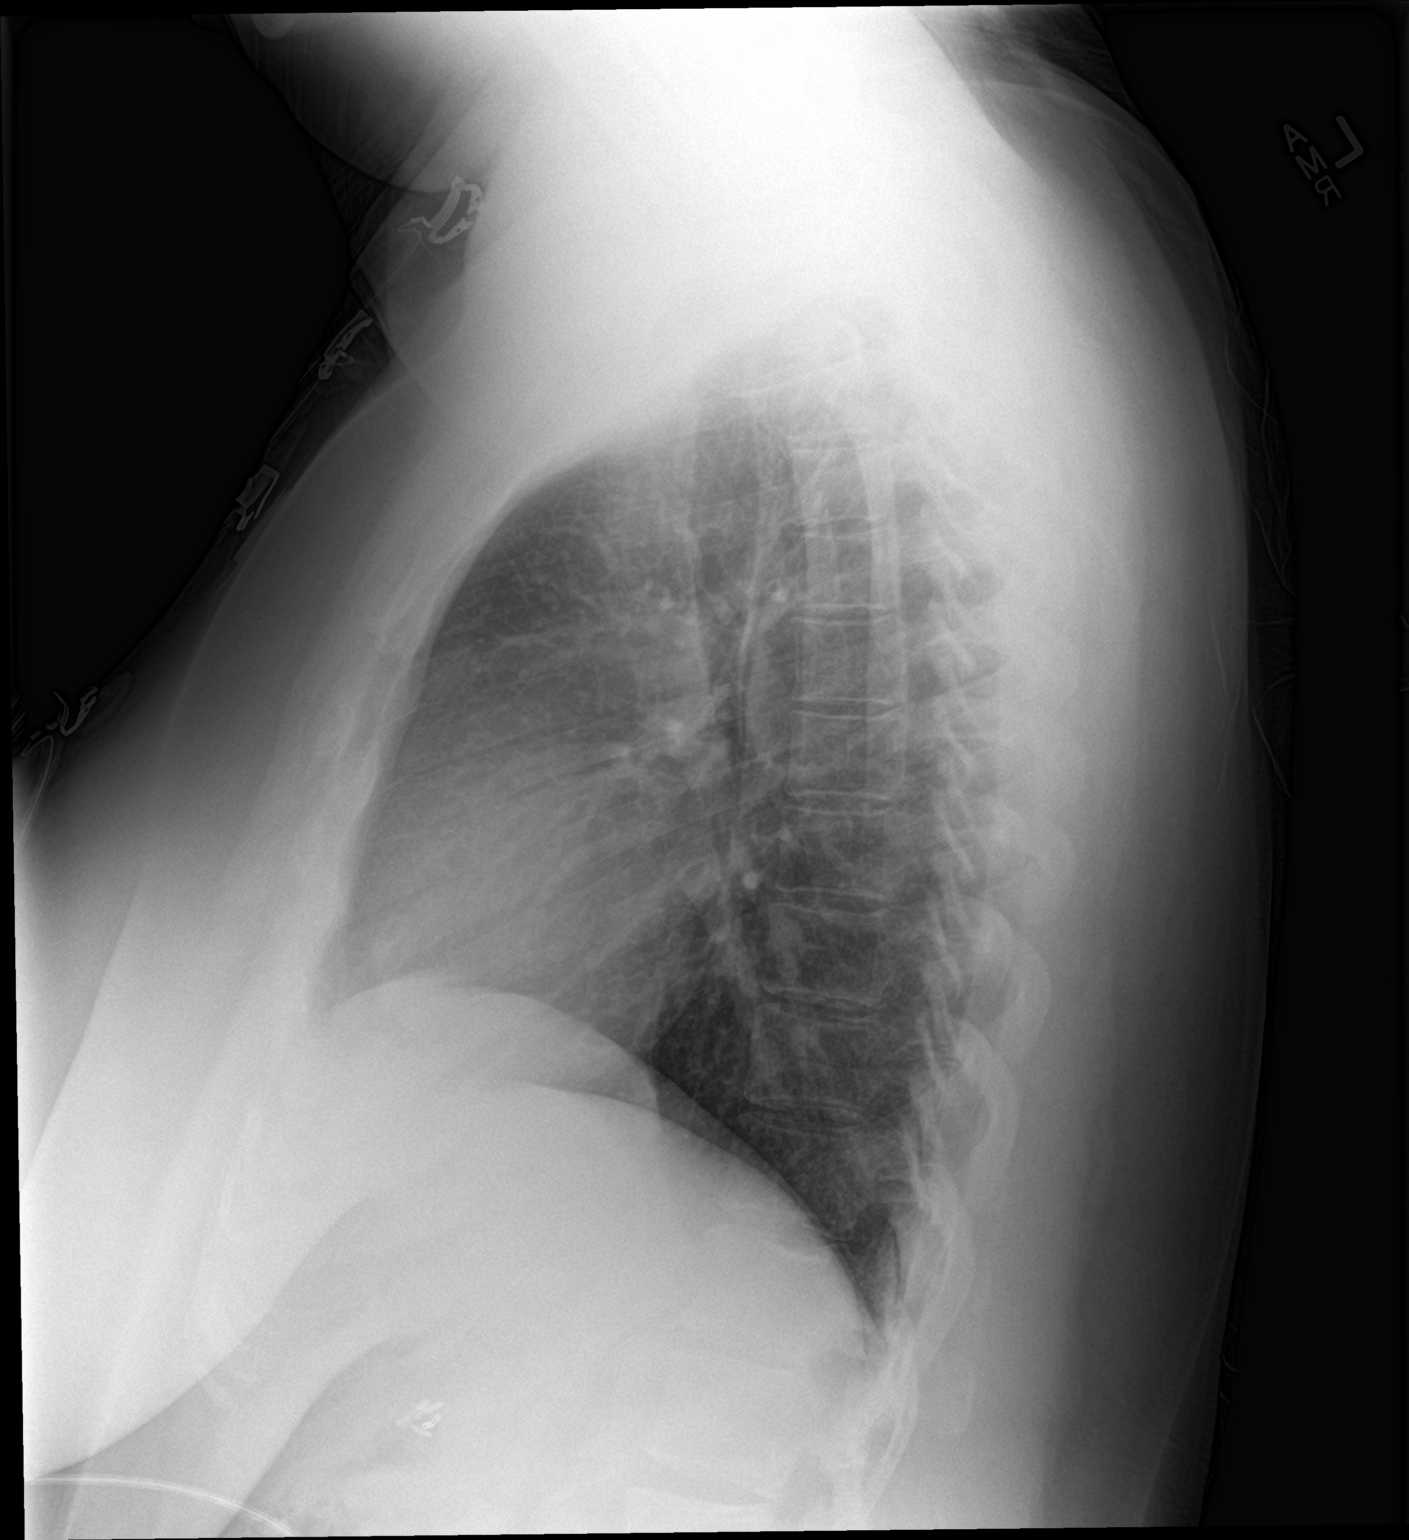

[2 of 2 positions shown; findings below may reference images not displayed]

FINDINGS: The lungs are adequately inflated. The interstitial markings are
mildly prominent though stable. There is no alveolar infiltrate,
pleural effusion, or pneumothorax. The heart and pulmonary
vascularity are normal. The mediastinum is normal in width. The bony
thorax exhibits no acute abnormality.
IMPRESSION: Probable mild smoking related interstitial changes. No pneumonia,
CHF, nor other acute cardiopulmonary disease.

## 2016-11-21 MED ORDER — SODIUM CHLORIDE 0.9 % IV BOLUS (SEPSIS)
1000.0000 mL | Freq: Once | INTRAVENOUS | Status: AC
  Administered 2016-11-21: 1000 mL via INTRAVENOUS

## 2016-11-21 MED ORDER — ONDANSETRON HCL 4 MG/2ML IJ SOLN
4.0000 mg | Freq: Once | INTRAMUSCULAR | Status: AC
  Administered 2016-11-21: 4 mg via INTRAVENOUS
  Filled 2016-11-21: qty 2

## 2016-11-21 MED ORDER — ONDANSETRON HCL 4 MG PO TABS: 4.0000 mg | ORAL_TABLET | Freq: Four times a day (QID) | ORAL | 0 refills | Status: DC

## 2016-11-21 MED ORDER — ACETAMINOPHEN 325 MG PO TABS
650.0000 mg | ORAL_TABLET | Freq: Once | ORAL | Status: AC
  Administered 2016-11-21: 650 mg via ORAL
  Filled 2016-11-21: qty 2

## 2016-11-21 NOTE — Telephone Encounter (Signed)
Agree with ER evaluation  J Leonardo Plaia MD 

## 2016-11-21 NOTE — ED Triage Notes (Signed)
Patient complaining of chest pain and tachycardia since yesterday. States she took metoprolol this morning "and it feels a little better."

## 2016-11-21 NOTE — Discharge Instructions (Signed)
Please read attached information regarding your condition. Continue home medications as previously prescribed. Take Zofran as needed for nausea. Follow-up with cardiologist for further evaluation and management. Increase fluid and food intake to prevent dehydration. Return to ED for worsening pain, head injury, loss of consciousness, trouble breathing, coughing up blood.

## 2016-11-21 NOTE — ED Provider Notes (Signed)
[facility] DEPT Provider Note   CSN: [id number] Arrival date & time: 11/21/16  0904     History   Chief Complaint Chief Complaint  Patient presents with  . Chest Pain    HPI Dana Mcpherson is a 26 y.o. female.  HPI Patient, with a past medical history of tachycardia, presents to ED for evaluation of constant, sharp chest pain and tachycardia since yesterday. She has been on metoprolol 12.5 mg twice daily for the past 3 months. She took one dose of it this morning and states that she feels better. She is worried because she has not had chest pain like this before. She also reports dizziness, nausea and "just feeling overall weird." She denies vomiting, urinary symptoms, abdominal pain, numbness, weakness, trouble walking, head injury, loss of consciousness, cough, hemoptysis, leg swelling, fevers or chills. Denies any previous history of PE, DVT, MI. He states that she is followed by a cardiologist who has done an echo, thyroid testing, PE testing which she states all returned as negative. She states that she has not been told what the origin of her tachycardia is.  Past Medical History:  Diagnosis Date  . Allergy   . Anxiety   . Complication of anesthesia    woke up during surgery & ithing after surgery  . Migraine headache   . Nexplanon in place 10/05/2015   06/29/15 left arm  . Polycystic ovarian syndrome   . Pregnancy induced hypertension   . Tachycardia   . Vaginal Pap smear, abnormal     Patient Active Problem List   Diagnosis Date Noted  . Atypical pneumonia 02/19/2016  . Pulmonary infiltrates 02/18/2016  . Nexplanon in place 10/05/2015  . Hemorrhoid 06/29/2015  . Hydradenitis 06/23/2015  . Shortness of breath 11/04/2014  . Tachycardia 10/07/2014  . ASCUS with positive high risk HPV 05/28/2014  . Hx of preeclampsia, prior pregnancy, currently pregnant 04/22/2014  . PCOS (polycystic ovarian syndrome) 03/03/2014  . Hyperinsulinemia 03/03/2014  . Morbid obesity  ([place]) 05/23/2013  . DUB (dysfunctional uterine bleeding) 05/23/2013    Past Surgical History:  Procedure Laterality Date  . CHOLECYSTECTOMY      OB History    Gravida Para Term Preterm AB Living   3 2 2   1 2    SAB TAB Ectopic Multiple Live Births   1     0 2      Obstetric Comments   IOL for pre-e       Home Medications    Prior to Admission medications   Medication Sig Start Date End Date Taking? Authorizing Provider  etonogestrel (NEXPLANON) 68 MG IMPL implant 1 each by Subdermal route once.   Yes [provider]  Flaxseed, Linseed, (FLAX SEEDS PO) Take 1,200 mg by mouth 2 (two) times daily.   Yes [provider]  metoprolol tartrate (LOPRESSOR) 25 MG tablet Take 0.5 tablets (12.5 mg total) by mouth 2 (two) times daily. 08/18/16  Yes Herminio Commons, MD  naproxen sodium (ANAPROX) 220 MG tablet Take 220-440 mg by mouth daily as needed (pain.).   Yes [provider]  ondansetron (ZOFRAN) 4 MG tablet Take 1 tablet (4 mg total) by mouth every 6 (six) hours. 11/21/16   Delia Heady, PA-C    Family History Family History  Problem Relation Age of Onset  . Cancer Paternal Grandmother        ovarian cancer  . Asthma Son   . ADD / ADHD Son     Social  History Social History  Substance Use Topics  . Smoking status: Former Research scientist (life sciences)  . Smokeless tobacco: Never Used  . Alcohol use No     Comment: occasional     Allergies   Other; Shellfish allergy; Adhesive [tape]; Eggs or egg-derived products; and Latex   Review of Systems Review of Systems  Constitutional: Negative for appetite change, chills and fever.  HENT: Negative for ear pain, rhinorrhea, sneezing and sore throat.   Eyes: Negative for photophobia and visual disturbance.  Respiratory: Negative for cough, chest tightness, shortness of breath and wheezing.   Cardiovascular: Positive for chest pain and palpitations.  Gastrointestinal: Positive for nausea. Negative for abdominal pain,  blood in stool, constipation, diarrhea and vomiting.  Genitourinary: Negative for dysuria, hematuria and urgency.  Musculoskeletal: Negative for myalgias.  Skin: Negative for rash.  Neurological: Positive for dizziness and headaches. Negative for weakness, light-headedness and numbness.     Physical Exam Updated Vital Signs BP 106/64   Pulse 77   Temp 98.2 F (36.8 C) (Oral)   Resp (!) 9   Ht 5\' 6"  (1.676 m)   Wt 104.3 kg (230 lb)   SpO2 97%   BMI 37.12 kg/m   Physical Exam  Constitutional: She is oriented to person, place, and time. She appears well-developed and well-nourished. No distress.  HENT:  Head: Normocephalic and atraumatic.  Nose: Nose normal.  Eyes: Pupils are equal, round, and reactive to light. Conjunctivae and EOM are normal. Right eye exhibits no discharge. Left eye exhibits no discharge. No scleral icterus.  Neck: Normal range of motion. Neck supple.  Cardiovascular: Normal rate, regular rhythm, normal heart sounds and intact distal pulses.  Exam reveals no gallop and no friction rub.   No murmur heard. Pulmonary/Chest: Effort normal and breath sounds normal. No respiratory distress.  Abdominal: Soft. Bowel sounds are normal. She exhibits no distension. There is tenderness (Generalized). There is no guarding.  Musculoskeletal: Normal range of motion. She exhibits no edema.  No leg swelling or calf tenderness noted.  Neurological: She is alert and oriented to person, place, and time. No cranial nerve deficit or sensory deficit. She exhibits normal muscle tone. Coordination normal.  Pupils reactive. No facial asymmetry noted. Cranial nerves appear grossly intact. Sensation intact to light touch on face, BUE and BLE. Strength 5/5 in BUE and BLE. Normal patellar reflexes bilaterally.  Skin: Skin is warm and dry. No rash noted.  Psychiatric: She has a normal mood and affect.  Nursing note and vitals reviewed.    ED Treatments / Results  Labs (all labs ordered  are listed, but only abnormal results are displayed) Labs Reviewed  BASIC METABOLIC PANEL  CBC  I-STAT TROPONIN, ED    EKG  EKG Interpretation  Date/Time:  Monday November 21 2016 09:14:51 EDT Ventricular Rate:  88 PR Interval:    QRS Duration: 79 QT Interval:  404 QTC Calculation: 489 R Axis:   56 Text Interpretation:  Sinus rhythm Low voltage, precordial leads Baseline wander in lead(s) II III aVF V5 Confirmed by Davonna Belling [phone] on 11/21/2016 9:21:02 AM       Radiology Dg Chest 2 View  Result Date: 11/21/2016 CLINICAL DATA:  Mid and left-sided chest discomfort and shortness of breath for the past 20 hours or so. Former smoker history of tachycardia, morbid obesity. EXAM: CHEST  2 VIEW COMPARISON:  Chest x-ray of August 02, 2016. FINDINGS: The lungs are adequately inflated. The interstitial markings are mildly prominent though stable. There is no  alveolar infiltrate, pleural effusion, or pneumothorax. The heart and pulmonary vascularity are normal. The mediastinum is normal in width. The bony thorax exhibits no acute abnormality. IMPRESSION: Probable mild smoking related interstitial changes. No pneumonia, CHF, nor other acute cardiopulmonary disease. Electronically Signed   By: David  Dana M.D.   On: 11/21/2016 10:15    Procedures Procedures (including critical care time)  Medications Ordered in ED Medications  acetaminophen (TYLENOL) tablet 650 mg (650 mg Oral Given 11/21/16 1015)  sodium chloride 0.9 % bolus 1,000 mL (1,000 mLs Intravenous New Bag/Given 11/21/16 1116)  ondansetron (ZOFRAN) injection 4 mg (4 mg Intravenous Given 11/21/16 1117)     Initial Impression / Assessment and Plan / ED Course  I have reviewed the triage vital signs and the nursing notes.  Pertinent labs & imaging results that were available during my care of the patient were reviewed by me and considered in my medical decision making (see chart for details).     She presents to ED for  evaluation of chest pain and palpitations since yesterday. She has a history of tachycardia for which she takes metoprolol 12.5 mg twice daily. She is followed by cardiologist. She states that her doctors are unsure what the origin of her tachycardia is but that she has been experiencing since pregnancy 2 years ago. She states she took her metoprolol and has help with the symptoms before arrival. On physical exam there is no tachycardia noted. She is satting at 97-100% on room air. She is afebrile with no history of fever. She does not appear in acute distress at this time. No focal findings on neurological exam. No vomiting noted at this time. Patient's heart rate has remained in 70s to 80s during this ED visit. Patient given fluids and Zofran and Tylenol with some relief in her symptoms. CBC, BMP, troponin negative and unremarkable. Chest x-ray negative. Patient is low risk for PE or DVT. She has no leg swelling noted. She was previously evaluated for PE twice and was negative both times. She denies hemoptysis, recent surgeries or history of cancer. We'll advise patient to follow up with cardiologist for further evaluation if symptoms persist. Advised Tylenol or ibuprofen as needed for chest wall pain. Given Zofran as needed for nausea. Patient appears stable for discharge at this time. Strict return precautions given for severe or worsening symptoms.  Patient discussed with Dr. Alvino Chapel.  Final Clinical Impressions(s) / ED Diagnoses   Final diagnoses:  Chest wall pain  Palpitations    New Prescriptions New Prescriptions   ONDANSETRON (ZOFRAN) 4 MG TABLET    Take 1 tablet (4 mg total) by mouth every 6 (six) hours.     Delia Heady, PA-C 11/21/16 1148    Davonna Belling, MD 11/21/16 [phone]

## 2016-11-21 NOTE — Telephone Encounter (Signed)
Pt c/o "pinching" in chest/palpitations/elevated HR between 100-166 was the highest - sweating and feeling hot - says this has been going on since yesterday - did take Lopressor last night and this morning which pt says brought HR down (pt didn't know what HR was this morning) - advised pt to report to [facility] ED for evaluation - pt says she will get her husband to drive her - routed to covering provider with Dr Bronson Ing out of office FYI

## 2017-02-06 ENCOUNTER — Other Ambulatory Visit: Payer: Self-pay | Admitting: Cardiovascular Disease

## 2017-03-22 ENCOUNTER — Encounter: Payer: Self-pay | Admitting: Advanced Practice Midwife

## 2017-03-22 ENCOUNTER — Other Ambulatory Visit: Payer: Self-pay

## 2017-03-22 ENCOUNTER — Ambulatory Visit: Payer: 59 | Admitting: Advanced Practice Midwife

## 2017-03-22 VITALS — BP 118/66 | Ht 66.5 in | Wt 241.0 lb

## 2017-03-22 DIAGNOSIS — Z3046 Encounter for surveillance of implantable subdermal contraceptive: Secondary | ICD-10-CM

## 2017-03-22 DIAGNOSIS — Z975 Presence of (intrauterine) contraceptive device: Secondary | ICD-10-CM

## 2017-03-22 MED ORDER — NORGESTIMATE-ETH ESTRADIOL 0.25-35 MG-MCG PO TABS: 1.0000 | ORAL_TABLET | Freq: Every day | ORAL | 11 refills | Status: DC

## 2017-03-22 MED ORDER — LEVONORGEST-ETH ESTRAD 91-DAY 0.15-0.03 MG PO TABS: 1.0000 | ORAL_TABLET | Freq: Every day | ORAL | 4 refills | Status: DC

## 2017-03-22 NOTE — Progress Notes (Addendum)
HPI:  Dana Mcpherson 26 y.o. here for Nexplanon removal.  She feels like it has moved and she ha shooting arm pain, plus she bleeds all the time and "is over it"Her future plans for birth control are COCs.  Wants seasonale     Past Medical History: Past Medical History:  Diagnosis Date  . Allergy   . Anxiety   . Complication of anesthesia    woke up during surgery & ithing after surgery  . Migraine headache   . Nexplanon in place 10/05/2015   06/29/15 left arm  . Polycystic ovarian syndrome   . Pregnancy induced hypertension   . Tachycardia   . Vaginal Pap smear, abnormal     Past Surgical History: Past Surgical History:  Procedure Laterality Date  . CHOLECYSTECTOMY      Family History: Family History  Problem Relation Age of Onset  . Cancer Paternal Grandmother        ovarian cancer  . Asthma Son   . ADD / ADHD Son     Social History: Social History   Tobacco Use  . Smoking status: Former Research scientist (life sciences)  . Smokeless tobacco: Never Used  Substance Use Topics  . Alcohol use: No    Comment: occasional  . Drug use: No    Allergies:  Allergies  Allergen Reactions  . Other Anaphylaxis and Other (See Comments)    Pt states that she is allergic to Axe/Tag body spray and Lysol.    . Shellfish Allergy Anaphylaxis and Hives  . Adhesive [Tape] Hives  . Eggs Or Egg-Derived Products Itching and Nausea Only  . Latex Itching    Meds:  (Not in a hospital admission)    Patient given informed consent for removal of her Nexplanon, time out was performed.  Signed copy in the chart.  Appropriate time out taken. Implanon site identified.  Area prepped in usual sterile fashon. One cc of 1% lidocaine was used to anesthetize the area at the distal end of the implant. A small stab incision was made right beside the implant on the distal portion.  The Nexplanon rod was grasped using hemostats and removed without difficulty.  There was less than 3 cc blood loss. There were no complications.  A  small amount of antibiotic ointment and steri-strips were applied over the small incision.  A pressure bandage was applied to reduce any bruising.  The patient tolerated the procedure well and was given post procedure instructions.  Start COCs today [facility] for 2 weeks

## 2017-03-22 NOTE — Addendum Note (Signed)
Addended by: Christin Fudge on: 03/22/2017 01:01 PM   Modules accepted: Orders

## 2017-03-22 NOTE — Patient Instructions (Signed)
Oral Contraception Information Oral contraceptive pills (OCPs) are medicines taken to prevent pregnancy. OCPs work by preventing the ovaries from releasing eggs. The hormones in OCPs also cause the cervical mucus to thicken, preventing the sperm from entering the uterus. The hormones also cause the uterine lining to become thin, not allowing a fertilized egg to attach to the inside of the uterus. OCPs are highly effective when taken exactly as prescribed. However, OCPs do not prevent sexually transmitted diseases (STDs). Safe sex practices, such as using condoms along with the pill, can help prevent STDs. Before taking the pill, you may have a physical exam and Pap test. Your health care provider may order blood tests. The health care provider will make sure you are a good candidate for oral contraception. Discuss with your health care provider the possible side effects of the OCP you may be prescribed. When starting an OCP, it can take 2 to 3 months for the body to adjust to the changes in hormone levels in your body. Types of oral contraception  The combination pill-This pill contains estrogen and progestin (synthetic progesterone) hormones. The combination pill comes in 21-day, 28-day, or 91-day packs. Some types of combination pills are meant to be taken continuously (365-day pills). With 21-day packs, you do not take pills for 7 days after the last pill. With 28-day packs, the pill is taken every day. The last 7 pills are without hormones. Certain types of pills have more than 21 hormone-containing pills. With 91-day packs, the first 84 pills contain both hormones, and the last 7 pills contain no hormones or contain estrogen only.  The minipill-This pill contains the progesterone hormone only. The pill is taken every day continuously. It is very important to take the pill at the same time each day. The minipill comes in packs of 28 pills. All 28 pills contain the hormone. Advantages of oral  contraceptive pills  Decreases premenstrual symptoms.  Treats menstrual period cramps.  Regulates the menstrual cycle.  Decreases a heavy menstrual flow.  May treatacne, depending on the type of pill.  Treats abnormal uterine bleeding.  Treats polycystic ovarian syndrome.  Treats endometriosis.  Can be used as emergency contraception. Things that can make oral contraceptive pills less effective OCPs can be less effective if:  You forget to take the pill at the same time every day.  You have a stomach or intestinal disease that lessens the absorption of the pill.  You take OCPs with other medicines that make OCPs less effective, such as antibiotics, certain HIV medicines, and some seizure medicines.  You take expired OCPs.  You forget to restart the pill on day 7, when using the packs of 21 pills.  Risks associated with oral contraceptive pills Oral contraceptive pills can sometimes cause side effects, such as:  Headache.  Nausea.  Breast tenderness.  Irregular bleeding or spotting.  Combination pills are also associated with a small increased risk of:  Blood clots.  Heart attack.  Stroke.  This information is not intended to replace advice given to you by your health care provider. Make sure you discuss any questions you have with your health care provider. Document Released: 07/02/2002 Document Revised: 09/17/2015 Document Reviewed: 09/30/2012 Elsevier Interactive Patient Education  2018 [employer]  

## 2017-04-26 ENCOUNTER — Encounter: Payer: Self-pay | Admitting: Cardiovascular Disease

## 2017-05-03 ENCOUNTER — Ambulatory Visit (INDEPENDENT_AMBULATORY_CARE_PROVIDER_SITE_OTHER): Payer: 59 | Admitting: Advanced Practice Midwife

## 2017-05-03 ENCOUNTER — Other Ambulatory Visit (HOSPITAL_COMMUNITY)
Admission: RE | Admit: 2017-05-03 | Discharge: 2017-05-03 | Disposition: A | Discharge status: Home / Self Care | Payer: 59 | Source: Ambulatory Visit | Attending: Advanced Practice Midwife | Admitting: Advanced Practice Midwife

## 2017-05-03 ENCOUNTER — Encounter: Payer: Self-pay | Admitting: Advanced Practice Midwife

## 2017-05-03 VITALS — BP 130/80 | HR 92 | Ht 66.0 in | Wt 236.0 lb

## 2017-05-03 DIAGNOSIS — R809 Proteinuria, unspecified: Secondary | ICD-10-CM

## 2017-05-03 DIAGNOSIS — R319 Hematuria, unspecified: Secondary | ICD-10-CM

## 2017-05-03 DIAGNOSIS — Z01419 Encounter for gynecological examination (general) (routine) without abnormal findings: Secondary | ICD-10-CM | POA: Diagnosis not present

## 2017-05-03 DIAGNOSIS — Z01411 Encounter for gynecological examination (general) (routine) with abnormal findings: Secondary | ICD-10-CM

## 2017-05-03 DIAGNOSIS — R350 Frequency of micturition: Secondary | ICD-10-CM

## 2017-05-03 DIAGNOSIS — R6882 Decreased libido: Secondary | ICD-10-CM

## 2017-05-03 LAB — POCT URINALYSIS DIPSTICK
Glucose, UA: NEGATIVE
KETONES UA: NEGATIVE
Leukocytes, UA: NEGATIVE
Nitrite, UA: NEGATIVE

## 2017-05-03 MED ORDER — SULFAMETHOXAZOLE-TRIMETHOPRIM 800-160 MG PO TABS: 1.0000 | ORAL_TABLET | Freq: Two times a day (BID) | ORAL | 0 refills | Status: DC

## 2017-05-03 MED ORDER — PHENAZOPYRIDINE HCL 200 MG PO TABS: 200.0000 mg | ORAL_TABLET | Freq: Three times a day (TID) | ORAL | 0 refills | Status: DC | PRN

## 2017-05-03 NOTE — Patient Instructions (Signed)
Hidradenitis Suppurativa Hidradenitis suppurativa is a long-term (chronic) skin disease that starts with blocked sweat glands or hair follicles. Bacteria may grow in these blocked openings of your skin. Hidradenitis suppurativa is like a severe form of acne that develops in areas of your body where acne would be unusual. It is most likely to affect the areas of your body where skin rubs against skin and becomes moist. This includes your:  Underarms.  Groin.  Genital areas.  Buttocks.  Upper thighs.  Breasts.  Hidradenitis suppurativa may start out with small pimples. The pimples can develop into deep sores that break open (rupture) and drain pus. Over time your skin may thicken and become scarred. Hidradenitis suppurativa cannot be passed from person to person. What are the causes? The exact cause of hidradenitis suppurativa is not known. This condition may be due to:  Female and female hormones. The condition is rare before and after puberty.  An overactive body defense system (immune system). Your immune system may overreact to the blocked hair follicles or sweat glands and cause swelling and pus-filled sores.  What increases the risk? You may have a higher risk of hidradenitis suppurativa if you:  Are a woman.  Are between ages 8 and 35.  Have a family history of hidradenitis suppurativa.  Have a personal history of acne.  Are overweight.  Smoke.  Take the drug lithium.  What are the signs or symptoms? The first signs of an outbreak are usually painful skin bumps that look like pimples. As the condition progresses:  Skin bumps may get bigger and grow deeper into the skin.  Bumps under the skin may rupture and drain smelly pus.  Skin may become itchy and infected.  Skin may thicken and scar.  Drainage may continue through tunnels under the skin (fistulas).  Walking and moving your arms can become painful.  How is this diagnosed? Your health care provider may  diagnose hidradenitis suppurativa based on your medical history and your signs and symptoms. A physical exam will also be done. You may need to see a health care provider who specializes in skin diseases (dermatologist). You may also have tests done to confirm the diagnosis. These can include:  Swabbing a sample of pus or drainage from your skin so it can be sent to the lab and tested for infection.  Blood tests to check for infection.  How is this treated? The same treatment will not work for everybody with hidradenitis suppurativa. Your treatment will depend on how severe your symptoms are. You may need to try several treatments to find what works best for you. Part of your treatment may include cleaning and bandaging (dressing) your wounds. You may also have to take medicines, such as the following:  Antibiotics.  Acne medicines.  Medicines to block or suppress the immune system. (Humira)  A diabetes medicine (metformin) is sometimes used to treat this condition.  For women, birth control pills can sometimes help relieve symptoms.  You may need surgery if you have a severe case of hidradenitis suppurativa that does not respond to medicine. Surgery may involve:  Using a laser to clear the skin and remove hair follicles.  Opening and draining deep sores.  Removing the areas of skin that are diseased and scarred.  Follow these instructions at home:  Learn as much as you can about your disease, and work closely with your health care providers.  Take medicines only as directed by your health care provider.  If you were  prescribed an antibiotic medicine, finish it all even if you start to feel better.  If you are overweight, losing weight may be very helpful. Try to reach and maintain a healthy weight.  Do not use any tobacco products, including cigarettes, chewing tobacco, or electronic cigarettes. If you need help quitting, ask your health care provider.  Do not shave the areas  where you get hidradenitis suppurativa.  Do not wear deodorant.  Wear loose-fitting clothes.  Try not to overheat and get sweaty.  Take a daily bleach bath as directed by your health care provider. ? Fill your bathtub halfway with water. ? Pour in  cup of unscented household bleach. ? Soak for 5-10 minutes.  Cover sore areas with a warm, clean washcloth (compress) for 5-10 minutes. Contact a health care provider if:  You have a flare-up of hidradenitis suppurativa.  You have chills or a fever.  You are having trouble controlling your symptoms at home. This information is not intended to replace advice given to you by your health care provider. Make sure you discuss any questions you have with your health care provider. Document Released: 11/24/2003 Document Revised: 09/17/2015 Document Reviewed: 07/12/2013 Elsevier Interactive Patient Education  2018 [employer].

## 2017-05-03 NOTE — Progress Notes (Signed)
Dana Mcpherson 27 y.o.  Vitals:   05/03/17 1002  BP: 130/80  Pulse: 92     Filed Weights   05/03/17 1002  Weight: 236 lb (107 kg)    Past Medical History: Past Medical History:  Diagnosis Date  . Allergy   . Anxiety   . Complication of anesthesia    woke up during surgery & ithing after surgery  . Migraine headache   . Nexplanon in place 10/05/2015   06/29/15 left arm  . Polycystic ovarian syndrome   . Pregnancy induced hypertension   . Tachycardia   . Vaginal Pap smear, abnormal     Past Surgical History: Past Surgical History:  Procedure Laterality Date  . CHOLECYSTECTOMY      Family History: Family History  Problem Relation Age of Onset  . Cancer Paternal Grandmother        ovarian cancer  . Asthma Son   . ADD / ADHD Son     Social History: Social History   Tobacco Use  . Smoking status: Former Smoker    Types: Cigarettes  . Smokeless tobacco: Never Used  Substance Use Topics  . Alcohol use: No    Comment: occasional  . Drug use: No    Allergies:  Allergies  Allergen Reactions  . Other Anaphylaxis and Other (See Comments)    Pt states that she is allergic to Axe/Tag body spray and Lysol.    . Shellfish Allergy Anaphylaxis and Hives  . Adhesive [Tape] Hives  . Eggs Or Egg-Derived Products Itching and Nausea Only  . Latex Itching      Current Outpatient Medications:  .  Flaxseed, Linseed, (FLAX SEEDS PO), Take 1,200 mg by mouth 2 (two) times daily., Disp: , Rfl:  .  levonorgestrel-ethinyl estradiol (SEASONALE,INTROVALE,JOLESSA) 0.15-0.03 MG tablet, Take 1 tablet by mouth daily., Disp: 1 Package, Rfl: 4 .  metoprolol tartrate (LOPRESSOR) 25 MG tablet, TAKE 1/2 TABLET BY MOUTH TWICE DAILY., Disp: 30 tablet, Rfl: 3 .  ondansetron (ZOFRAN) 4 MG tablet, Take 1 tablet (4 mg total) by mouth every 6 (six) hours., Disp: 12 tablet, Rfl: 0 .  phenazopyridine (PYRIDIUM) 200 MG tablet, Take 1 tablet (200 mg total) by mouth 3 (three) times daily as needed  for pain., Disp: 10 tablet, Rfl: 0 .  sulfamethoxazole-trimethoprim (BACTRIM DS,SEPTRA DS) 800-160 MG tablet, Take 1 tablet by mouth 2 (two) times daily., Disp: 10 tablet, Rfl: 0  History of Present Illness: here for pap and physical.  Had Nexplanon removed 6 weeks ago (arm pain, bleeding too much) and started on Seasonale.  No more bleeding.  Last pap 2017, normal. Previous pap in 2016 was ASCUS/HRHPV.   C/O no sex drive since birth of son 2 years ago. Has PCOS, needs to be on hormonal BC. Last sex 3 months ago.  Has orgasms, is "fine" once she starts, but no desire. Says loves husband and has a good relationship. 1 yo sleeps in the same bed as them, "I know I need to get him out."  Says feels like she "may be getting a little better since changing birth control".    Review of Systems   Patient denies any headaches, blurred vision, shortness of breath, chest pain, abdominal pain, problems with bowel movements.  C/O urinary frequency and dysuria for a few days.  Feels like a UTI.    Physical Exam: General:  Well developed, well nourished, no acute distress Skin:  Warm and dry Neck:  Midline trachea, normal thyroid Lungs;  Clear to auscultation bilaterally Breast:  No dominant palpable mass, retraction, or nipple discharge Cardiovascular: Regular rate and rhythm Abdomen:  Soft, non tender, no hepatosplenomegaly Pelvic:  External genitalia is normal in appearance.  The vagina is normal in appearance.  The cervix is bulbous.  Uterus is felt to be normal size, shape, and contour.  No adnexal masses or tenderness noted. Exam limited by habitus.  Extremities:  No swelling or varicosities noted Psych: feels more moody, low sex drive   Impression: hematuria w/urianry frequency and dysuria. Will tx for UTI.   Normal GYN exam  Low libido:  Discussed possible reasons (COCs, 27 yo in the same bed). Used analogy of going to the gym (if you rarely go, it will always be a chore, whereas more frequent  visits make it better).  Also discussed Addyi:  Given # to pharmacy; to call and discuss possible drug interactions (COCs, lopressor).  And check on cost.  Pt will let me know if she wants it rx'd w/surescripts.    Hidradenitis suppuraiva:  Given info, may look into humera  Plan: Pap today and one more yearly (if normal), then can go to q 3 years.

## 2017-05-04 LAB — CYTOLOGY - PAP
Adequacy: ABSENT
Chlamydia: NEGATIVE
Diagnosis: NEGATIVE
NEISSERIA GONORRHEA: NEGATIVE

## 2017-05-25 ENCOUNTER — Telehealth: Payer: Self-pay | Admitting: *Deleted

## 2017-05-25 NOTE — Telephone Encounter (Signed)
Patient calling with c/o elevated heart rate.  Bothering off / on x  1 month.  Did have episode earlier today where she felt heart racing with dizziness during the episode.  Did c/o feeling tired & shaky after episode.  No c/o active chest pain, dizziness, or palpitations at this current time.  Stated that she did not have her fit bit on at time of episode, heart rate now is 81.    Discussed above with Dr. Bronson Ing - he suggest OV with extender for evaluation.    Patient notified & OV scheduled for 05/31/2017 with Estella Husk, PA in our [place] office.  Advised if symptoms worsen in the meantime or if become syncopal - proceed to ED / call 911 for evaluation.  She verbalized understanding.

## 2017-05-31 ENCOUNTER — Other Ambulatory Visit (HOSPITAL_COMMUNITY)
Admission: RE | Admit: 2017-05-31 | Discharge: 2017-05-31 | Disposition: A | Discharge status: Home / Self Care | Payer: 59 | Source: Ambulatory Visit | Attending: Physician Assistant | Admitting: Physician Assistant

## 2017-05-31 ENCOUNTER — Ambulatory Visit: Payer: 59 | Admitting: Physician Assistant

## 2017-05-31 ENCOUNTER — Encounter: Payer: Self-pay | Admitting: Physician Assistant

## 2017-05-31 VITALS — BP 122/82 | HR 94 | Ht 66.5 in | Wt 239.0 lb

## 2017-05-31 DIAGNOSIS — R000 Tachycardia, unspecified: Secondary | ICD-10-CM

## 2017-05-31 DIAGNOSIS — Z79899 Other long term (current) drug therapy: Secondary | ICD-10-CM | POA: Insufficient documentation

## 2017-05-31 LAB — BASIC METABOLIC PANEL
ANION GAP: 13 (ref 5–15)
BUN: 7 mg/dL (ref 6–20)
CALCIUM: 9.9 mg/dL (ref 8.9–10.3)
CO2: 21 mmol/L — ABNORMAL LOW (ref 22–32)
CREATININE: 0.73 mg/dL (ref 0.44–1.00)
Chloride: 105 mmol/L (ref 101–111)
GFR calc Af Amer: >60 mL/min (ref >60)
GLUCOSE: 90 mg/dL (ref 65–99)
Potassium: 3.9 mmol/L (ref 3.5–5.1)
Sodium: 139 mmol/L (ref 135–145)

## 2017-05-31 LAB — MAGNESIUM: Magnesium: 2.2 mg/dL (ref 1.7–2.4)

## 2017-05-31 LAB — TSH: TSH: 2.418 u[IU]/mL (ref 0.350–4.500)

## 2017-05-31 MED ORDER — METOPROLOL TARTRATE 25 MG PO TABS: 25.0000 mg | ORAL_TABLET | Freq: Three times a day (TID) | ORAL | 6 refills | Status: DC

## 2017-05-31 NOTE — Patient Instructions (Signed)
Medication Instructions:  Increase Lopressor to 25 mg Three Times Daily   Labwork: Your physician recommends that you return for lab work in: Today   Testing/Procedures: NONE   Follow-Up: Your physician recommends that you schedule a follow-up appointment in: 2-3 Months    Any Other Special Instructions Will Be Listed Below (If Applicable).     If you need a refill on your cardiac medications before your next appointment, please call your pharmacy.  Thank you for choosing [facility]!
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
## 2017-06-01 ENCOUNTER — Telehealth: Payer: Self-pay | Admitting: *Deleted

## 2017-06-01 NOTE — Telephone Encounter (Signed)
-----   Message from Imogene Burn, PA-C sent at 06/01/2017  7:49 AM EST ----- Thyroid, and electrolytes all normal

## 2017-06-01 NOTE — Telephone Encounter (Signed)
Called patient with test results. No answer. Left message to call back.  

## 2017-08-15 ENCOUNTER — Ambulatory Visit: Payer: 59 | Admitting: Cardiovascular Disease

## 2017-11-08 ENCOUNTER — Ambulatory Visit: Payer: 59 | Admitting: Cardiovascular Disease

## 2017-11-08 ENCOUNTER — Encounter

## 2017-11-08 ENCOUNTER — Encounter: Payer: Self-pay | Admitting: Cardiovascular Disease

## 2017-11-08 VITALS — BP 128/78 | HR 86 | Ht 66.5 in | Wt 247.8 lb

## 2017-11-08 DIAGNOSIS — R55 Syncope and collapse: Secondary | ICD-10-CM

## 2017-11-08 DIAGNOSIS — R002 Palpitations: Secondary | ICD-10-CM | POA: Diagnosis not present

## 2017-11-08 DIAGNOSIS — R000 Tachycardia, unspecified: Secondary | ICD-10-CM

## 2017-11-08 MED ORDER — IVABRADINE HCL 5 MG PO TABS: 5.0000 mg | ORAL_TABLET | Freq: Two times a day (BID) | ORAL | 3 refills | Status: DC

## 2017-11-08 NOTE — Patient Instructions (Addendum)
Your physician recommends that you schedule a follow-up appointment in: October WITH DR [place] physician has recommended you make the following change in your medication:   STOP METOPROLOL   START IVABRADINE 5 MG TWICE DAILY   Thank you for choosing [facility]!!

## 2017-11-08 NOTE — Progress Notes (Signed)
SUBJECTIVE: The patient presents for follow-up of tachycardia and palpitations.  Metoprolol was increased to 12.5 mg every 8 hours in February 2019.  TSH, magnesium, and basic metabolic panel were all unremarkable.  Echocardiogram 09/08/16 demonstrated normal left ventricular systolic and diastolic function and normal regional wall motion, LVEF 60-65%.  She tried taking 12.5 mg of metoprolol 3 times daily but she became dizzy and lightheaded and felt fatigued and said it dropped her blood pressure.  She checks her blood pressure at home and says it is usually normal.  She feels that taking metoprolol 12.5 mg twice daily does help prevent a fast resting heart rate but she still has heart rates that quickly jump up into the 160 bpm range.  The last time she wore an event monitor 3 years ago she became allergic to the pads and could not use it for the full duration.  She also has episodic headaches with blurriness of vision associated with tender spots on the chest wall.  Several times she has felt like she was going to pass out but denies ever having lost consciousness.  I personally reviewed the ECG performed on 05/31/2017 which showed normal sinus rhythm, 87 bpm.     Review of Systems: As per "subjective", otherwise negative.  Allergies  Allergen Reactions  . Other Anaphylaxis and Other (See Comments)    Pt states that she is allergic to Axe/Tag body spray and Lysol.    . Shellfish Allergy Anaphylaxis and Hives  . Adhesive [Tape] Hives  . Eggs Or Egg-Derived Products Itching and Nausea Only  . Latex Itching    Current Outpatient Medications  Medication Sig Dispense Refill  . Flaxseed, Linseed, (FLAX SEEDS PO) Take 1,200 mg by mouth 2 (two) times daily.    Marland Kitchen levonorgestrel-ethinyl estradiol (SEASONALE,INTROVALE,JOLESSA) 0.15-0.03 MG tablet Take 1 tablet by mouth daily.    . metoprolol tartrate (LOPRESSOR) 25 MG tablet Take 1 tablet (25 mg total) by mouth 3 (three) times  daily. 90 tablet 6   No current facility-administered medications for this visit.     Past Medical History:  Diagnosis Date  . Allergy   . Anxiety   . Complication of anesthesia    woke up during surgery & ithing after surgery  . Migraine headache   . Nexplanon in place 10/05/2015   06/29/15 left arm  . Polycystic ovarian syndrome   . Pregnancy induced hypertension   . Tachycardia   . Vaginal Pap smear, abnormal     Past Surgical History:  Procedure Laterality Date  . CHOLECYSTECTOMY      Social History   Socioeconomic History  . Marital status: Married    Spouse name: Not on file  . Number of children: Not on file  . Years of education: Not on file  . Highest education level: Not on file  Occupational History  . Not on file  Social Needs  . Financial resource strain: Not on file  . Food insecurity:    Worry: Not on file    Inability: Not on file  . Transportation needs:    Medical: Not on file    Non-medical: Not on file  Tobacco Use  . Smoking status: Former Smoker    Types: Cigarettes  . Smokeless tobacco: Never Used  Substance and Sexual Activity  . Alcohol use: No    Comment: occasional  . Drug use: No  . Sexual activity: Yes    Birth control/protection: Pill  Lifestyle  . Physical  activity:    Days per week: Not on file    Minutes per session: Not on file  . Stress: Not on file  Relationships  . Social connections:    Talks on phone: Not on file    Gets together: Not on file    Attends religious service: Not on file    Active member of club or organization: Not on file    Attends meetings of clubs or organizations: Not on file    Relationship status: Not on file  . Intimate partner violence:    Fear of current or ex partner: Not on file    Emotionally abused: Not on file    Physically abused: Not on file    Forced sexual activity: Not on file  Other Topics Concern  . Not on file  Social History Narrative  . Not on file     Vitals:    11/08/17 1442  BP: 128/78  Pulse: 86  SpO2: 98%  Weight: 247 lb 12.8 oz (112.4 kg)  Height: 5' 6.5" (1.689 m)    Wt Readings from Last 3 Encounters:  11/08/17 247 lb 12.8 oz (112.4 kg)  05/31/17 239 lb (108.4 kg)  05/03/17 236 lb (107 kg)     PHYSICAL EXAM General: NAD HEENT: Normal. Neck: No JVD, no thyromegaly. Lungs: Clear to auscultation bilaterally with normal respiratory effort. CV: Regular rate and rhythm, normal S1/S2, no S3/S4, no murmur. No pretibial or periankle edema.  No carotid bruit.   Abdomen: Soft, nontender, no distention.  Neurologic: Alert and oriented.  Psych: Normal affect. Skin: Normal. Musculoskeletal: No gross deformities.    ECG: Reviewed above under Subjective   Labs: Lab Results  Component Value Date/Time   K 3.9 05/31/2017 04:10 PM   BUN 7 05/31/2017 04:10 PM   CREATININE 0.73 05/31/2017 04:10 PM   CREATININE 0.62 05/02/2013 03:40 PM   ALT 46 04/23/2016 05:03 PM   TSH 2.418 05/31/2017 04:10 PM   TSH 2.417 05/02/2013 03:40 PM   HGB 14.5 11/21/2016 09:13 AM   HGB 12.2 09/09/2014 09:02 AM     Lipids: No results found for: LDLCALC, LDLDIRECT, CHOL, TRIG, HDL     ASSESSMENT AND PLAN: 1.  Tachycardia and palpitations with near syncope: She continues to have tachycardia and palpitations with metoprolol 12.5 mg twice daily.  She could not tolerate taking it 3 times daily.  She has a history of inappropriate sinus tachycardia.  I will stop metoprolol and try ivabradine 5 mg twice daily.    Disposition: Follow up 2 to 3 months   Kate Sable, M.D., F.A.C.C.

## 2018-02-09 ENCOUNTER — Encounter: Payer: Self-pay | Admitting: Cardiovascular Disease

## 2018-02-09 ENCOUNTER — Ambulatory Visit: Payer: 59 | Admitting: Cardiovascular Disease

## 2018-02-09 VITALS — BP 125/79 | HR 95 | Ht 66.5 in | Wt 248.2 lb

## 2018-02-09 DIAGNOSIS — R002 Palpitations: Secondary | ICD-10-CM | POA: Diagnosis not present

## 2018-02-09 DIAGNOSIS — R000 Tachycardia, unspecified: Secondary | ICD-10-CM

## 2018-02-09 DIAGNOSIS — R29898 Other symptoms and signs involving the musculoskeletal system: Secondary | ICD-10-CM | POA: Diagnosis not present

## 2018-02-09 DIAGNOSIS — R55 Syncope and collapse: Secondary | ICD-10-CM

## 2018-02-09 NOTE — Patient Instructions (Signed)
Your physician wants you to follow-up in: [place] will receive a reminder letter in the mail two months in advance. If you don't receive a letter, please call our office to schedule the follow-up appointment.  Your physician recommends that you continue on your current medications as directed. Please refer to the Current Medication list given to you today.  You have been referred to [facility]   Thank you for choosing [facility]!!

## 2018-02-09 NOTE — Progress Notes (Signed)
SUBJECTIVE: The patient presents for follow-up of tachycardia and palpitations with near syncope.  She previously did not tolerate metoprolol 12.5 mg 3 times daily.  She has a history of inappropriate sinus tachycardia.  I started ivabradine 5 mg twice daily at her last visit.  Echocardiogram 09/08/16 demonstrated normal left ventricular systolic and diastolic function and normal regional wall motion, LVEF 60-65%.  The last time she wore an event monitor 3 years ago she became allergic to the pads and could not use it for the full duration.  Ivabradine would cost her $240 per month so she did not buy it.  She has continued to take Lopressor 12.5 mg twice daily.  She has had less frequent chest pain and palpitations.  It used to occur every 2 to 3 days and now occurs about once a month.  She also told me that she has been experiencing bilateral wrist pain, right greater than left ever since her last pregnancy.  She has tried wearing splints with no relief.  She has noticed herself dropping things with both hands.  She is here with her 29-year-old son, Max.   Review of Systems: As per "subjective", otherwise negative.  Allergies  Allergen Reactions  . Other Anaphylaxis and Other (See Comments)    Pt states that she is allergic to Axe/Tag body spray and Lysol.    . Shellfish Allergy Anaphylaxis and Hives  . Adhesive [Tape] Hives  . Eggs Or Egg-Derived Products Itching and Nausea Only  . Latex Itching    Current Outpatient Medications  Medication Sig Dispense Refill  . Flaxseed, Linseed, (FLAX SEEDS PO) Take 1,200 mg by mouth 2 (two) times daily.    Marland Kitchen levonorgestrel-ethinyl estradiol (SEASONALE,INTROVALE,JOLESSA) 0.15-0.03 MG tablet Take 1 tablet by mouth daily.    . metoprolol tartrate (LOPRESSOR) 25 MG tablet Take 0.5 tablets by mouth 2 (two) times daily.    . ivabradine (CORLANOR) 5 MG TABS tablet Take 1 tablet (5 mg total) by mouth 2 (two) times daily with a meal. (Patient not  taking: Reported on 02/09/2018) 60 tablet 3   No current facility-administered medications for this visit.     Past Medical History:  Diagnosis Date  . Allergy   . Anxiety   . Complication of anesthesia    woke up during surgery & ithing after surgery  . Migraine headache   . Nexplanon in place 10/05/2015   06/29/15 left arm  . Polycystic ovarian syndrome   . Pregnancy induced hypertension   . Tachycardia   . Vaginal Pap smear, abnormal     Past Surgical History:  Procedure Laterality Date  . CHOLECYSTECTOMY      Social History   Socioeconomic History  . Marital status: Married    Spouse name: Not on file  . Number of children: Not on file  . Years of education: Not on file  . Highest education level: Not on file  Occupational History  . Not on file  Social Needs  . Financial resource strain: Not on file  . Food insecurity:    Worry: Not on file    Inability: Not on file  . Transportation needs:    Medical: Not on file    Non-medical: Not on file  Tobacco Use  . Smoking status: Former Smoker    Types: Cigarettes  . Smokeless tobacco: Never Used  Substance and Sexual Activity  . Alcohol use: No    Comment: occasional  . Drug use: No  .  Sexual activity: Yes    Birth control/protection: Pill  Lifestyle  . Physical activity:    Days per week: Not on file    Minutes per session: Not on file  . Stress: Not on file  Relationships  . Social connections:    Talks on phone: Not on file    Gets together: Not on file    Attends religious service: Not on file    Active member of club or organization: Not on file    Attends meetings of clubs or organizations: Not on file    Relationship status: Not on file  . Intimate partner violence:    Fear of current or ex partner: Not on file    Emotionally abused: Not on file    Physically abused: Not on file    Forced sexual activity: Not on file  Other Topics Concern  . Not on file  Social History Narrative  . Not on  file     Vitals:   02/09/18 0949  BP: 125/79  Pulse: 95  SpO2: 98%  Weight: 248 lb 3.2 oz (112.6 kg)  Height: 5' 6.5" (1.689 m)    Wt Readings from Last 3 Encounters:  02/09/18 248 lb 3.2 oz (112.6 kg)  11/08/17 247 lb 12.8 oz (112.4 kg)  05/31/17 239 lb (108.4 kg)     PHYSICAL EXAM General: NAD HEENT: Normal. Neck: No JVD, no thyromegaly. Lungs: Clear to auscultation bilaterally with normal respiratory effort. CV: Regular rate and rhythm, normal S1/S2, no S3/S4, no murmur. No pretibial or periankle edema.     Abdomen: Soft, nontender, no distention.  Neurologic: Alert and oriented.  Psych: Normal affect. Skin: Normal. Musculoskeletal: No gross deformities.    ECG: Reviewed above under Subjective   Labs: Lab Results  Component Value Date/Time   K 3.9 05/31/2017 04:10 PM   BUN 7 05/31/2017 04:10 PM   CREATININE 0.73 05/31/2017 04:10 PM   CREATININE 0.62 05/02/2013 03:40 PM   ALT 46 04/23/2016 05:03 PM   TSH 2.418 05/31/2017 04:10 PM   TSH 2.417 05/02/2013 03:40 PM   HGB 14.5 11/21/2016 09:13 AM   HGB 12.2 09/09/2014 09:02 AM     Lipids: No results found for: LDLCALC, LDLDIRECT, CHOL, TRIG, HDL     ASSESSMENT AND PLAN:  1.  Tachycardia and palpitations with near syncope:  She has recently noticed symptom improvement with metoprolol 12.5 mg twice daily.  She could not tolerate taking it 3 times daily.  She has a history of inappropriate sinus tachycardia.  She could not afford ivabradine.  I also recommended exercise in a pool with resistance training.  2.  Bilateral wrist pain and hand weakness: She likely needs nerve conduction studies.  I will make a neurology referral.   Disposition: Follow up 6 months   Kate Sable, M.D., F.A.C.C.

## 2018-03-18 ENCOUNTER — Other Ambulatory Visit: Payer: Self-pay | Admitting: Advanced Practice Midwife

## 2018-04-09 ENCOUNTER — Telehealth: Payer: Self-pay | Admitting: *Deleted

## 2018-04-09 ENCOUNTER — Ambulatory Visit: Payer: 59 | Admitting: Diagnostic Neuroimaging

## 2018-04-09 NOTE — Telephone Encounter (Signed)
Patient called this morning and canceled her new patient appointment this morning. She stated she is sick with fever, will call back to reschedule.

## 2018-08-06 ENCOUNTER — Telehealth: Payer: Self-pay | Admitting: *Deleted

## 2018-08-06 NOTE — Telephone Encounter (Signed)
Attempted to reach patient for review of chart for virtual visit [facility]) on 08/08/2018 with Dr. Bronson Ing at 9:00 am.

## 2018-08-07 NOTE — Telephone Encounter (Signed)
Patient returning call.

## 2018-08-07 NOTE — Telephone Encounter (Signed)
Pt verbalized consent for telehealth appt with Dr Bronson Ing 08/08/18. Reviewed medications/allergies/pharmacy. Pt willhave BP/HR/weight available.

## 2018-08-08 ENCOUNTER — Telehealth (INDEPENDENT_AMBULATORY_CARE_PROVIDER_SITE_OTHER): Payer: Medicaid Other | Admitting: Cardiovascular Disease

## 2018-08-08 ENCOUNTER — Encounter: Payer: Self-pay | Admitting: Cardiovascular Disease

## 2018-08-08 VITALS — Ht 66.5 in | Wt 242.0 lb

## 2018-08-08 DIAGNOSIS — M7989 Other specified soft tissue disorders: Secondary | ICD-10-CM

## 2018-08-08 DIAGNOSIS — R000 Tachycardia, unspecified: Secondary | ICD-10-CM

## 2018-08-08 DIAGNOSIS — R0609 Other forms of dyspnea: Secondary | ICD-10-CM

## 2018-08-08 DIAGNOSIS — R002 Palpitations: Secondary | ICD-10-CM | POA: Diagnosis not present

## 2018-08-08 MED ORDER — IVABRADINE HCL 5 MG PO TABS: 5.0000 mg | ORAL_TABLET | Freq: Two times a day (BID) | ORAL | 6 refills | Status: DC

## 2018-08-08 NOTE — Addendum Note (Signed)
Addended by: Laurine Blazer on: 08/08/2018 11:42 AM   Modules accepted: Orders

## 2018-08-08 NOTE — Patient Instructions (Signed)
Medication Instructions:   Stop Lopressor.    Begin Corlanor 5mg  twice a day.  Continue all other medications.    Labwork: none  Testing/Procedures: none  Follow-Up: 2 months   Any Other Special Instructions Will Be Listed Below (If Applicable).  If you need a refill on your cardiac medications before your next appointment, please call your pharmacy.

## 2018-08-08 NOTE — Progress Notes (Signed)
Virtual Visit via Video Note   This visit type was conducted due to national recommendations for restrictions regarding the COVID-19 Pandemic (e.g. social distancing) in an effort to limit this patient's exposure and mitigate transmission in our community.  Due to her co-morbid illnesses, this patient is at least at moderate risk for complications without adequate follow up.  This format is felt to be most appropriate for this patient at this time.  All issues noted in this document were discussed and addressed.  A limited physical exam was performed with this format.  Please refer to the patient's chart for her consent to telehealth for [facility].   Evaluation Performed:  Follow-up visit  Date:  08/08/2018   ID:  Dana Mcpherson, DOB 03-02-1991, MRN [id number]  Patient Location: Home Provider Location: Home  PCP:  Sharilyn Sites, MD  Cardiologist:  Kate Sable, MD  Electrophysiologist:  None   Chief Complaint:  Palpitations  History of Present Illness:    Dana Mcpherson is a 28 y.o. female with a history of tachycardia and palpitations. She previously did not tolerate metoprolol 12.5 mg 3 times daily.  She has a history of inappropriate sinus tachycardia.  I started ivabradine 5 mg twice daily at a prior visit but her insurance would not cover it.  She then took Lopressor 12.5 mg twice daily and had been doing well.  The last time she wore an event monitor 3 years ago she became allergic to the pads and could not use it for the full duration.  Due to the coronavirus pandemic, she is currently at home with her two children.  Her husband works for [employer].  She has noticed shortness of breath while climbing stairs.  She gets very hot at night.  Chest pains are infrequent.  She has noted her resting heart rate to be 100 bpm while sitting and when she stands it goes up to 114 bpm.  It can increase to 160 bpm with walking.  She becomes short of breath with  exertion but has also noticed shortness of breath at night while lying down.  She denies orthopnea and paroxysmal nocturnal dyspnea.  She has swelling of her hands.  I previously referred her to neurology for nerve conduction studies but the appointment has been postponed due to the pandemic.  The patient does not have symptoms concerning for COVID-19 infection (fever, chills, cough, or new shortness of breath).    Past Medical History:  Diagnosis Date  . Allergy   . Anxiety   . Complication of anesthesia    woke up during surgery & ithing after surgery  . Migraine headache   . Nexplanon in place 10/05/2015   06/29/15 left arm  . Polycystic ovarian syndrome   . Pregnancy induced hypertension   . Tachycardia   . Vaginal Pap smear, abnormal    Past Surgical History:  Procedure Laterality Date  . CHOLECYSTECTOMY       Current Meds  Medication Sig  . Flaxseed, Linseed, (FLAX SEEDS PO) Take 1,200 mg by mouth 2 (two) times daily.  . INTROVALE 0.15-0.03 MG tablet TAKE 1T BY MOUTH EVERY DAY  . ivabradine (CORLANOR) 5 MG TABS tablet Take 1 tablet (5 mg total) by mouth 2 (two) times daily with a meal.  . metoprolol tartrate (LOPRESSOR) 25 MG tablet Take 0.5 tablets by mouth 2 (two) times daily.     Allergies:   Other; Shellfish allergy; Adhesive [tape]; Eggs or egg-derived products; and Latex  Social History   Tobacco Use  . Smoking status: Former Smoker    Types: Cigarettes  . Smokeless tobacco: Never Used  Substance Use Topics  . Alcohol use: No    Comment: occasional  . Drug use: No     Family Hx: The patient's family history includes ADD / ADHD in her son; Asthma in her son; Cancer in her paternal grandmother.  ROS:   Please see the history of present illness.     All other systems reviewed and are negative.   Prior CV studies:   The following studies were reviewed today:  Echocardiogram 09/08/2016:  Study Conclusions  - Left ventricle: The cavity size was  normal. Wall thickness was   normal. Systolic function was normal. The estimated ejection   fraction was in the range of 60% to 65%. Images were inadequate   for LV wall motion assessment. No gross regional variation on   available images. Left ventricular diastolic function parameters   were normal.  Labs/Other Tests and Data Reviewed:    EKG:  No ECG reviewed.  Recent Labs: No results found for requested labs within last 8760 hours.   Recent Lipid Panel No results found for: CHOL, TRIG, HDL, CHOLHDL, LDLCALC, LDLDIRECT  Wt Readings from Last 3 Encounters:  08/08/18 242 lb (109.8 kg)  02/09/18 248 lb 3.2 oz (112.6 kg)  11/08/17 247 lb 12.8 oz (112.4 kg)     Objective:    Vital Signs:  Ht 5' 6.5" (1.689 m)   Wt 242 lb (109.8 kg)   BMI 38.47 kg/m    Well nourished, well developed female in no acute distress. No jugular venous distention HEENT: Normocephalic, atraumatic, extraocular movements intact Musculoskeletal: Mild swelling of both hands.  ASSESSMENT & PLAN:    1.  Tachycardia and palpitations/inappropriate sinus tachycardia: Heart rates continue to elevate on low-dose Lopressor 12.5 mg twice daily.  She now has new insurance.  I will again try ivabradine 5 mg twice daily.  If she cannot afford this, I would consider cautiously increasing Lopressor to 25 mg twice daily with close monitoring of her blood pressure.  2.  Bilateral hand swelling, wrist pain and hand weakness: I previously made a referral to neurology.  This is on hold due to coronavirus pandemic.  COVID-19 Education: The signs and symptoms of COVID-19 were discussed with the patient and how to seek care for testing (follow up with PCP or arrange E-visit).  The importance of social distancing was discussed today.  Time:   Today, I have spent 25 minutes with the patient with telehealth technology discussing the above problems.     Medication Adjustments/Labs and Tests Ordered: Current medicines are  reviewed at length with the patient today.  Concerns regarding medicines are outlined above.   Tests Ordered: No orders of the defined types were placed in this encounter.   Medication Changes: No orders of the defined types were placed in this encounter.   Disposition:  Follow up in 2 month(s)  Signed, Kate Sable, MD  08/08/2018 9:19 AM    [facility]

## 2018-10-24 ENCOUNTER — Telehealth: Payer: Self-pay | Admitting: Cardiovascular Disease

## 2018-10-24 ENCOUNTER — Telehealth: Payer: Self-pay | Admitting: *Deleted

## 2018-10-24 NOTE — Telephone Encounter (Signed)
Patient called in regards to her appointment for 10/25/2018. States that Dr. Bronson Ing told her that he wanted to see her in the office on this visitvs doing a virtual visit.   [phone].

## 2018-10-24 NOTE — Telephone Encounter (Signed)
Yes, that would be fine. Morning appt as I'm doing all virtuals in the afternoon.

## 2018-10-24 NOTE — Telephone Encounter (Signed)

## 2018-10-24 NOTE — Telephone Encounter (Signed)
Will move to morning appt

## 2018-10-25 ENCOUNTER — Telehealth: Payer: Self-pay | Admitting: *Deleted

## 2018-10-25 ENCOUNTER — Encounter: Payer: Self-pay | Admitting: *Deleted

## 2018-10-25 ENCOUNTER — Telehealth: Payer: Medicaid Other | Admitting: Cardiovascular Disease

## 2018-10-25 ENCOUNTER — Other Ambulatory Visit: Payer: Self-pay

## 2018-10-25 ENCOUNTER — Encounter: Payer: Self-pay | Admitting: Cardiovascular Disease

## 2018-10-25 ENCOUNTER — Ambulatory Visit (INDEPENDENT_AMBULATORY_CARE_PROVIDER_SITE_OTHER): Payer: Medicaid Other | Admitting: Cardiovascular Disease

## 2018-10-25 ENCOUNTER — Other Ambulatory Visit (HOSPITAL_COMMUNITY)
Admission: RE | Admit: 2018-10-25 | Discharge: 2018-10-25 | Disposition: A | Discharge status: Home / Self Care | Payer: Medicaid Other | Source: Ambulatory Visit | Attending: Cardiovascular Disease | Admitting: Cardiovascular Disease

## 2018-10-25 VITALS — BP 130/83 | HR 78 | Ht 66.5 in | Wt 246.4 lb

## 2018-10-25 DIAGNOSIS — R112 Nausea with vomiting, unspecified: Secondary | ICD-10-CM

## 2018-10-25 DIAGNOSIS — R000 Tachycardia, unspecified: Secondary | ICD-10-CM | POA: Diagnosis not present

## 2018-10-25 DIAGNOSIS — R1900 Intra-abdominal and pelvic swelling, mass and lump, unspecified site: Secondary | ICD-10-CM | POA: Diagnosis not present

## 2018-10-25 DIAGNOSIS — R002 Palpitations: Secondary | ICD-10-CM | POA: Diagnosis present

## 2018-10-25 DIAGNOSIS — R1084 Generalized abdominal pain: Secondary | ICD-10-CM

## 2018-10-25 LAB — COMPREHENSIVE METABOLIC PANEL
ALT: 61 U/L — ABNORMAL HIGH (ref 0–44)
AST: 37 U/L (ref 15–41)
Albumin: 4.2 g/dL (ref 3.5–5.0)
Alkaline Phosphatase: 71 U/L (ref 38–126)
Anion gap: 14 (ref 5–15)
BUN: 12 mg/dL (ref 6–20)
CO2: 21 mmol/L — ABNORMAL LOW (ref 22–32)
Calcium: 9.6 mg/dL (ref 8.9–10.3)
Chloride: 102 mmol/L (ref 98–111)
Creatinine, Ser: 0.74 mg/dL (ref 0.44–1.00)
GFR calc Af Amer: >60 mL/min (ref >60)
GFR calc non Af Amer: >60 mL/min (ref >60)
Glucose, Bld: 97 mg/dL (ref 70–99)
Potassium: 4.2 mmol/L (ref 3.5–5.1)
Sodium: 137 mmol/L (ref 135–145)
Total Bilirubin: 0.4 mg/dL (ref 0.3–1.2)
Total Protein: 8.1 g/dL (ref 6.5–8.1)

## 2018-10-25 LAB — CBC
HCT: 42.6 % (ref 36.0–46.0)
Hemoglobin: 13.8 g/dL (ref 12.0–15.0)
MCH: 27.9 pg (ref 26.0–34.0)
MCHC: 32.4 g/dL (ref 30.0–36.0)
MCV: 86.2 fL (ref 80.0–100.0)
Platelets: 354 10*3/uL (ref 150–400)
RBC: 4.94 MIL/uL (ref 3.87–5.11)
RDW: 13.7 % (ref 11.5–15.5)
WBC: 9.6 10*3/uL (ref 4.0–10.5)
nRBC: 0 % (ref 0.0–0.2)

## 2018-10-25 LAB — TSH: TSH: 2.384 u[IU]/mL (ref 0.350–4.500)

## 2018-10-25 LAB — C-REACTIVE PROTEIN: CRP: 3.8 mg/dL — ABNORMAL HIGH (ref <1.0)

## 2018-10-25 MED ORDER — IVABRADINE HCL 5 MG PO TABS: 7.5000 mg | ORAL_TABLET | Freq: Two times a day (BID) | ORAL | 1 refills | Status: DC

## 2018-10-25 NOTE — Progress Notes (Addendum)
SUBJECTIVE: The patient presents for routine follow-up.  She has a history of tachycardia and palpitations/inappropriate sinus tachycardia.  She takes ivabradine 5 mg twice daily.  She feels the ivabradine has helped her more so than the metoprolol.  She still has episodes of her heart rate quickly accelerating sometimes at rest and certainly with walking.  She tries to walk 5000 steps per day.  She was recently stung by bee and developed hives and was then told she was "allergic to the sun".  Echocardiogram in May 2018 demonstrated normal left ventricular systolic function, LVEF 60 to 65%.  Her problems with tachycardia began after she was pregnant with her son, Max, in 2016.  A CT renal stone study on 04/23/2016 showed hepatic steatosis.  She believes she had an abdominal ultrasound at [facility] in 2019.  Since I last saw her she has cut out drinking soda altogether.  She tries to drink a lot of water.  She continues to have significant bloating and swelling of the abdomen as well as the upper chest and on the sides of the thorax.  She has a history of cholecystectomy.  Shortly after eating she has a bowel movement which is usually loose.  She often feels nauseous and feels like she has to vomit.    Review of Systems: As per "subjective", otherwise negative.  Allergies  Allergen Reactions  . Other Anaphylaxis and Other (See Comments)    Pt states that she is allergic to Axe/Tag body spray and Lysol.    . Shellfish Allergy Anaphylaxis and Hives  . Adhesive [Tape] Hives  . Eggs Or Egg-Derived Products Itching and Nausea Only  . Latex Itching    Current Outpatient Medications  Medication Sig Dispense Refill  . Flaxseed, Linseed, (FLAX SEEDS PO) Take 1,200 mg by mouth 2 (two) times daily.    . INTROVALE 0.15-0.03 MG tablet TAKE 1T BY MOUTH EVERY DAY 91 tablet 11  . ivabradine (CORLANOR) 5 MG TABS tablet Take 1 tablet (5 mg total) by mouth 2 (two) times daily with a  meal. 60 tablet 6   No current facility-administered medications for this visit.     Past Medical History:  Diagnosis Date  . Allergy   . Anxiety   . Complication of anesthesia    woke up during surgery & ithing after surgery  . Migraine headache   . Nexplanon in place 10/05/2015   06/29/15 left arm  . Polycystic ovarian syndrome   . Pregnancy induced hypertension   . Tachycardia   . Vaginal Pap smear, abnormal     Past Surgical History:  Procedure Laterality Date  . CHOLECYSTECTOMY      Social History   Socioeconomic History  . Marital status: Married    Spouse name: Not on file  . Number of children: Not on file  . Years of education: Not on file  . Highest education level: Not on file  Occupational History  . Not on file  Social Needs  . Financial resource strain: Not on file  . Food insecurity    Worry: Not on file    Inability: Not on file  . Transportation needs    Medical: Not on file    Non-medical: Not on file  Tobacco Use  . Smoking status: Former Smoker    Types: Cigarettes  . Smokeless tobacco: Never Used  Substance and Sexual Activity  . Alcohol use: No    Comment: occasional  . Drug use: No  .  Sexual activity: Yes    Birth control/protection: Pill  Lifestyle  . Physical activity    Days per week: Not on file    Minutes per session: Not on file  . Stress: Not on file  Relationships  . Social Herbalist on phone: Not on file    Gets together: Not on file    Attends religious service: Not on file    Active member of club or organization: Not on file    Attends meetings of clubs or organizations: Not on file    Relationship status: Not on file  . Intimate partner violence    Fear of current or ex partner: Not on file    Emotionally abused: Not on file    Physically abused: Not on file    Forced sexual activity: Not on file  Other Topics Concern  . Not on file  Social History Narrative  . Not on file     Vitals:   10/25/18  0826  BP: 130/83  Pulse: 78  SpO2: 98%  Weight: 246 lb 6.4 oz (111.8 kg)  Height: 5' 6.5" (1.689 m)    Wt Readings from Last 3 Encounters:  10/25/18 246 lb 6.4 oz (111.8 kg)  08/08/18 242 lb (109.8 kg)  02/09/18 248 lb 3.2 oz (112.6 kg)     PHYSICAL EXAM General: NAD HEENT: Normal. Neck: No JVD, no thyromegaly. Lungs: Clear to auscultation bilaterally with normal respiratory effort. CV: Regular rate and rhythm, normal S1/S2, no S3/S4, no murmur. No pretibial or periankle edema.  Mild chest wall tenderness in the right infra-axillary region.  No carotid bruit.   Abdomen: Soft, mild tenderness (diffusely), mild distention.  Neurologic: Alert and oriented.  Psych: Normal affect. Skin: Normal. Musculoskeletal: No gross deformities.    ECG: Reviewed above under Subjective   Labs: Lab Results  Component Value Date/Time   K 3.9 05/31/2017 04:10 PM   BUN 7 05/31/2017 04:10 PM   CREATININE 0.73 05/31/2017 04:10 PM   CREATININE 0.62 05/02/2013 03:40 PM   ALT 46 04/23/2016 05:03 PM   TSH 2.418 05/31/2017 04:10 PM   TSH 2.417 05/02/2013 03:40 PM   HGB 14.5 11/21/2016 09:13 AM   HGB 12.2 09/09/2014 09:02 AM     Lipids: No results found for: LDLCALC, LDLDIRECT, CHOL, TRIG, HDL     ASSESSMENT AND PLAN:  1.  Tachycardia and palpitations/inappropriate sinus tachycardia: I will increase ivabradine to 7.5 mg twice daily.  I will check a TSH.  2.  Abdominal swelling, nausea, and vomiting: She has a history of cholecystectomy.  CT renal stone study in December 2017 showed hepatic steatosis.  I will obtain a comprehensive metabolic panel, CBC, TSH, and CRP.  I will try to obtain a copy of the abdominal ultrasound performed in 2019.   Disposition: Follow up 3 months   Kate Sable, M.D., F.A.C.C.  Addendum:  I reviewed the abdominal ultrasound performed on 04/26/2017.  It demonstrated no acute abnormalities with mildly echogenic liver suggesting steatosis.  Evidence of  prior cholecystectomy was seen.  I will obtain an abdominal CT.

## 2018-10-25 NOTE — Telephone Encounter (Signed)
Pt voiced understanding and that TSH is normal - order already placed and forwarded to schedulers

## 2018-10-25 NOTE — Addendum Note (Signed)
Addended by: Julian Hy T on: 10/25/2018 10:53 AM   Modules accepted: Orders

## 2018-10-25 NOTE — Patient Instructions (Addendum)
Your physician recommends that you schedule a follow-up appointment in: [place] physician has recommended you make the following change in your medication:   Tibbie 7.5 MG TWICE DAILY   Your physician recommends that you return for lab work CBC/CMP/TSH/CRP   Non-Cardiac CT scanning, (CAT scanning) abdominal is a noninvasive, special x-ray that produces cross-sectional images of the body using x-rays and a computer. CT scans help physicians diagnose and treat medical conditions. For some CT exams, a contrast material is used to enhance visibility in the area of the body being studied. CT scans provide greater clarity and reveal more details than regular x-ray exams.   Thank you for choosing [facility]!!

## 2018-10-25 NOTE — Telephone Encounter (Signed)
-----   Message from Herminio Commons, MD sent at 10/25/2018 11:14 AM EDT ----- CRP is elevated indicative of an underlying inflammatory process as suspected. One of her liver enzymes is only mildly elevated. Will await results of abdominal CT. CBC is normal. Awaiting TSH results.

## 2018-10-29 ENCOUNTER — Telehealth: Payer: Self-pay | Admitting: Cardiovascular Disease

## 2018-10-29 NOTE — Telephone Encounter (Signed)
Pre-cert Verification for the following procedure    CT ABD WO & W CM  Scheduled for 11/14/2018 at [facility]

## 2018-11-09 ENCOUNTER — Other Ambulatory Visit: Payer: Self-pay | Admitting: Internal Medicine

## 2018-11-09 ENCOUNTER — Other Ambulatory Visit: Payer: Medicaid Other

## 2018-11-09 DIAGNOSIS — Z20822 Contact with and (suspected) exposure to covid-19: Secondary | ICD-10-CM

## 2018-11-09 NOTE — Progress Notes (Signed)
[id number] 

## 2018-11-13 NOTE — Telephone Encounter (Signed)
Noted, message fwd to Dr. Bronson Ing.  Last ultrasound was January 2019.

## 2018-11-13 NOTE — Telephone Encounter (Signed)
ct was denied by medicaid.  they are saying that she would need an ultrasound first Will forward to Dr. Bronson Ing for further instructions.

## 2018-11-14 ENCOUNTER — Ambulatory Visit (HOSPITAL_COMMUNITY): Payer: Medicaid Other

## 2018-11-14 NOTE — Telephone Encounter (Signed)
She had an abdominal US 1 year ago that was nondiagnostic and thus Dr Raliegh Ip wanted to do a CT to get a more definitive evaluation. Is there anyway to show them the prior US   Zandra Abts MD

## 2018-11-15 LAB — NOVEL CORONAVIRUS, NAA: SARS-CoV-2, NAA: NOT DETECTED

## 2018-11-15 NOTE — Telephone Encounter (Signed)
Per note from Dr. Harl Bowie :  She had an abdominal US 1 year ago that was nondiagnostic and thus Dr Raliegh Ip wanted to do a CT to get a more definitive evaluation. Is there anyway to show them the prior US   Zandra Abts MD  Will forward to [place]

## 2018-12-18 ENCOUNTER — Telehealth: Payer: Self-pay | Admitting: Cardiovascular Disease

## 2018-12-18 NOTE — Telephone Encounter (Signed)
Patient called in regards to the CT of abdomen that was ordered. Dr. Harl Bowie had put in a note for insurance to try see if the procedure could be re-submitted for authorization. Will forward to percert.

## 2019-01-22 ENCOUNTER — Ambulatory Visit: Payer: Medicaid Other | Admitting: Cardiovascular Disease

## 2019-01-28 ENCOUNTER — Encounter: Payer: Self-pay | Admitting: Cardiovascular Disease

## 2019-01-28 ENCOUNTER — Telehealth (INDEPENDENT_AMBULATORY_CARE_PROVIDER_SITE_OTHER): Payer: Medicaid Other | Admitting: Cardiovascular Disease

## 2019-01-28 VITALS — HR 70 | Ht 66.5 in | Wt 244.0 lb

## 2019-01-28 DIAGNOSIS — R1084 Generalized abdominal pain: Secondary | ICD-10-CM

## 2019-01-28 DIAGNOSIS — R002 Palpitations: Secondary | ICD-10-CM

## 2019-01-28 DIAGNOSIS — R112 Nausea with vomiting, unspecified: Secondary | ICD-10-CM

## 2019-01-28 DIAGNOSIS — R000 Tachycardia, unspecified: Secondary | ICD-10-CM

## 2019-01-28 NOTE — Progress Notes (Signed)
Virtual Visit via Video Note   This visit type was conducted due to national recommendations for restrictions regarding the COVID-19 Pandemic (e.g. social distancing) in an effort to limit this patient's exposure and mitigate transmission in our community.  Due to her co-morbid illnesses, this patient is at least at moderate risk for complications without adequate follow up.  This format is felt to be most appropriate for this patient at this time.  All issues noted in this document were discussed and addressed.  A limited physical exam was performed with this format.  Please refer to the patient's chart for her consent to telehealth for [facility].   Date:  01/28/2019   ID:  Dana Mcpherson, DOB [birth year], MRN [id number]  Patient Location: Home Provider Location: Office  PCP:  Sharilyn Sites, MD  Cardiologist:  Kate Sable, MD  Electrophysiologist:  None   Evaluation Performed:  Follow-Up Visit  Chief Complaint:  palpitations/inappropriate sinus tachycardia  History of Present Illness:    Dana Mcpherson is a 28 y.o. female with a history of palpitations and inappropriate sinus tachycardia.  She takes ivabradine.  Echocardiogram in May 2018 demonstrated normal left ventricular systolic function, LVEF 60 to 65%.  Her problems with tachycardia began after she was pregnant with her son, Dana Mcpherson, in 2016.  She has felt improvement on ivabradine 7.5 mg twice daily.  Resting heart rates are in the 60-80 bpm range and get up to 100 bpm with vigorous activity.  She has been trying to walk 2 to 3 miles daily but is only lost a few pounds.  She also has polycystic ovarian syndrome.  She has significant GERD and now takes Protonix.  She wakes up feeling nauseous and has vomiting and abdominal pain.  She has cut out caffeine and sodas altogether.  The patient does not have symptoms concerning for COVID-19 infection (fever, chills, cough, or new shortness of breath).    Past  Medical History:  Diagnosis Date  . Allergy   . Anxiety   . Complication of anesthesia    woke up during surgery & ithing after surgery  . Migraine headache   . Nexplanon in place 10/05/2015   06/29/15 left arm  . Polycystic ovarian syndrome   . Pregnancy induced hypertension   . Tachycardia   . Vaginal Pap smear, abnormal    Past Surgical History:  Procedure Laterality Date  . CHOLECYSTECTOMY       Current Meds  Medication Sig  . Flaxseed, Linseed, (FLAX SEEDS PO) Take 1,200 mg by mouth 2 (two) times daily.  . INTROVALE 0.15-0.03 MG tablet TAKE 1T BY MOUTH EVERY DAY  . ivabradine (CORLANOR) 5 MG TABS tablet Take 1.5 tablets (7.5 mg total) by mouth 2 (two) times daily with a meal.  . pantoprazole (PROTONIX) 40 MG tablet Take 40 mg by mouth daily.     Allergies:   Other, Shellfish allergy, Adhesive [tape], Eggs or egg-derived products, and Latex   Social History   Tobacco Use  . Smoking status: Former Smoker    Types: Cigarettes  . Smokeless tobacco: Never Used  Substance Use Topics  . Alcohol use: No    Comment: occasional  . Drug use: No     Family Hx: The patient's family history includes ADD / ADHD in her son; Asthma in her son; Cancer in her paternal grandmother.  ROS:   Please see the history of present illness.     All other systems reviewed and are negative.  Prior CV studies:   The following studies were reviewed today:  See above  Labs/Other Tests and Data Reviewed:    EKG:  No ECG reviewed.  Recent Labs: 10/25/2018: ALT 61; BUN 12; Creatinine, Ser 0.74; Hemoglobin 13.8; Platelets 354; Potassium 4.2; Sodium 137; TSH 2.384   Recent Lipid Panel No results found for: CHOL, TRIG, HDL, CHOLHDL, LDLCALC, LDLDIRECT  Wt Readings from Last 3 Encounters:  01/28/19 244 lb (110.7 kg)  10/25/18 246 lb 6.4 oz (111.8 kg)  08/08/18 242 lb (109.8 kg)     Objective:    Vital Signs:  Pulse 70   Ht 5' 6.5" (1.689 m)   Wt 244 lb (110.7 kg)   BMI 38.79 kg/m     VITAL SIGNS:  reviewed   General: No acute distress. HEENT: Extraocular movements intact, no scleral icterus. Respiratory: Normal respiratory movements. Neurologic: Nonfocal.  Alert and oriented x3. Psych: Normal affect.  ASSESSMENT & PLAN:    1.  Tachycardia and palpitations/inappropriate sinus tachycardia: Continue ivabradine 7.5 mg twice daily.  She did not tolerate metoprolol as it led to hypotension.  2.  Abdominal pain with nausea and vomiting: I will make a GI referral.  Insurance would not cover abdominal CT which I previously ordered. A CT renal stone study on 04/23/2016 showed hepatic steatosis.  She believes she had an abdominal ultrasound at [facility] in 2019. She has a history of cholecystectomy.  Shortly after eating she has a bowel movement which is usually loose.  She often feels nauseous and feels like she has to vomit.  3.  GERD: Symptoms better controlled on Protonix 40 mg daily.    COVID-19 Education: The signs and symptoms of COVID-19 were discussed with the patient and how to seek care for testing (follow up with PCP or arrange E-visit).  The importance of social distancing was discussed today.  Time:   Today, I have spent 15 minutes with the patient with telehealth technology discussing the above problems.     Medication Adjustments/Labs and Tests Ordered: Current medicines are reviewed at length with the patient today.  Concerns regarding medicines are outlined above.   Tests Ordered: No orders of the defined types were placed in this encounter.   Medication Changes: No orders of the defined types were placed in this encounter.   Follow Up:  In Person in 3 months  Signed, Kate Sable, MD  01/28/2019 11:13 AM    [place]

## 2019-01-28 NOTE — Addendum Note (Signed)
Addended by: Laurine Blazer on: 01/28/2019 11:44 AM   Modules accepted: Orders

## 2019-01-28 NOTE — Patient Instructions (Addendum)
Medication Instructions:  Continue all current medications.  Labwork: none  Testing/Procedures: none  Follow-Up: 3 months   Any Other Special Instructions Will Be Listed Below (If Applicable). You have been referred to:  GI - Dr. Barney Drain - their office should be calling you with the appointment.    If you need a refill on your cardiac medications before your next appointment, please call your pharmacy.

## 2019-01-29 ENCOUNTER — Encounter: Payer: Self-pay | Admitting: Internal Medicine

## 2019-01-29 ENCOUNTER — Other Ambulatory Visit: Payer: Self-pay | Admitting: Cardiovascular Disease

## 2019-02-19 NOTE — Progress Notes (Signed)
Referring Provider: Herminio Commons, MD Primary Care Physician:  Sharilyn Sites, MD Primary Gastroenterologist:  Dr. Gala Romney  Chief Complaint  Patient presents with  . Nausea    at least once a week  . Bloated  . Abdominal Pain    varies all over    HPI:   Dana Mcpherson is a 28 y.o. female presenting today at the request of Herminio Commons, MD for nausea, vomiting, and generalized abdominal pain.  Patient saw Dr. Bronson Ing on 01/28/2019 for follow-up and reported significant GERD on Protonix and waking up feeling nauseous with vomiting and abdominal pain.  At a prior visit in July she had reported bloating, abdominal swelling, and nausea.  Attempts were made to obtain a CT but her insurance denied.  Labs in July with CMP remarkable for ALT slightly elevated at 61, CBC within normal limits, TSH normal, CRP slightly elevated at 3.8.  Of note slight elevation of LFTs in 2010 with AST 61, ALT 52, alk phos 123.  Ultrasound in January 2019 for nausea, vomiting, and diarrhea with evidence of hepatic steatosis, prior cholecystectomy, no acute abnormalities.  Today she states symptoms have been present for years. States EGD in the past and was told she had an ulcer. Can't remember where this was. Was given medication that was supposed to "coat the lining of my stomach." This helped her symptoms significantly. Then had gallbladder out in 2010. Started feeling sick again. Any time she eats, she has to use the bathroom soon after. Acid reflux started worsening again around 2016 when she got pregnant with her second child and has progressively been getting worse. She will throw up just from bending over or at least always have acid in the back of her throat. Some abdominal pain when bending over in the lower abdomen and upper abdomen. It varies. Has been on protonix x 6 months. She doesn't have the burning like she did, but continues with feeling acid in the back of her throat several days a week.  Hasn't thrown up since starting Protonix. Continues with nausea. Nausea at least a couple times a week. Not associated with meals. Usually worse in the morning. Was just taking a lot of tums prior to starting Protonix.   Reflux is worse with spicy or greasy foods. She is trying to avoid these. No soda. Rare chocolate. No caffeine. Drinks sparkling waters. Rare citrus. Takes ibuprofen for bad headaches. 400 mg a couple times a day, 1-2 days a week. Has been doing this for a while. Occasional upper abdominal bloating. Broccoli and beans will cause this. Was eating broccoli on a daily basis. Occasional dysphagia with pills. Also with corn and peas. Occasionally with meats. Feels it hangs at the sternal notch. Occurring 1-2 times a week. Reports family has had esophagus stretched. She was trying to loose weight. Lost about 10-15 lbs about 1 month ago with changing her diet and walking. Was following low carb diet.   Lower abdominal pain prior to BMs. States she will have a lot of cramping. Cramping usually resolves after a BM. Stools are typically loose to watery. If eating out, it is worse. Typically may have 2-3 BMs a day. May go a day without a BM. No blood in the stool. No back stools. No nocturnal stools. Admits to abdominal swelling. Denies swelling in lower extremities.   Denies alcohol consumption or drug use of any kind. No OTC supplements other than flax seed. She does have a tattoo received at  a tattoo parlor. No known exposure to hepatitis.    Past Medical History:  Diagnosis Date  . Allergy   . Anxiety   . Complication of anesthesia    woke up during surgery & ithing after surgery  . Migraine headache   . Nexplanon in place 10/05/2015   06/29/15 left arm  . Polycystic ovarian syndrome   . Pregnancy induced hypertension   . Tachycardia   . Vaginal Pap smear, abnormal     Past Surgical History:  Procedure Laterality Date  . CHOLECYSTECTOMY  2010  . ESOPHAGOGASTRODUODENOSCOPY       Current Outpatient Medications  Medication Sig Dispense Refill  . CORLANOR 5 MG TABS tablet TAKE 1 & 1/2 TABLETS BY MOUTH 2 TIMES A DAY WITH A MEAL. 270 tablet 3  . Flaxseed, Linseed, (FLAX SEEDS PO) Take 1,200 mg by mouth 2 (two) times daily.    . INTROVALE 0.15-0.03 MG tablet TAKE 1T BY MOUTH EVERY DAY 91 tablet 11  . dexlansoprazole (DEXILANT) 60 MG capsule Take 1 capsule (60 mg total) by mouth daily. 90 capsule 3  . dicyclomine (BENTYL) 10 MG capsule Take 1 capsule (10 mg total) by mouth 4 (four) times daily -  before meals and at bedtime. 90 capsule 0   No current facility-administered medications for this visit.     Allergies as of 02/20/2019 - Review Complete 02/20/2019  Allergen Reaction Noted  . Other Anaphylaxis and Other (See Comments) 08/30/2012  . Shellfish allergy Anaphylaxis and Hives 05/20/2013  . Adhesive [tape] Hives 10/10/2014  . Eggs or egg-derived products Itching and Nausea Only 07/05/2012  . Latex Itching 11/04/2014    Family History  Problem Relation Age of Onset  . Cancer Paternal Grandmother        ovarian cancer  . Asthma Son   . ADD / ADHD Son   . Colon cancer Other        late 66's    Social History   Socioeconomic History  . Marital status: Married    Spouse name: Not on file  . Number of children: Not on file  . Years of education: Not on file  . Highest education level: Not on file  Occupational History  . Not on file  Social Needs  . Financial resource strain: Not on file  . Food insecurity    Worry: Not on file    Inability: Not on file  . Transportation needs    Medical: Not on file    Non-medical: Not on file  Tobacco Use  . Smoking status: Former Smoker    Types: Cigarettes  . Smokeless tobacco: Never Used  Substance and Sexual Activity  . Alcohol use: No  . Drug use: No  . Sexual activity: Yes    Birth control/protection: Pill  Lifestyle  . Physical activity    Days per week: Not on file    Minutes per session: Not on  file  . Stress: Not on file  Relationships  . Social Herbalist on phone: Not on file    Gets together: Not on file    Attends religious service: Not on file    Active member of club or organization: Not on file    Attends meetings of clubs or organizations: Not on file    Relationship status: Not on file  . Intimate partner violence    Fear of current or ex partner: Not on file    Emotionally abused: Not on file  Physically abused: Not on file    Forced sexual activity: Not on file  Other Topics Concern  . Not on file  Social History Narrative  . Not on file    Review of Systems: Gen: Denies any fever, chills.  CV: Denies chest pain. Occasional heart palpitations- takes medication for this. Resp: Admits to exertional SOB. No SOB at rest. No cough.  GI: See HPI GU : Frequent UTIs.  MS: Pain in her hands   Derm: Denies rash Psych: Denies depression, anxiety Heme: Denies bruising, bleeding  Physical Exam: BP 129/82   Pulse 80   Temp 98.2 F (36.8 C) (Oral)   Ht 5' 6.5" (1.689 m)   Wt 241 lb 9.6 oz (109.6 kg)   LMP 11/13/2018 Comment: cycle every 3 months  BMI 38.41 kg/m  General:   Alert and oriented. Pleasant and cooperative. Well-nourished and well-developed.  Head:  Normocephalic and atraumatic. Eyes:  Without icterus, sclera clear and conjunctiva pink.  Ears:  Normal auditory acuity. Nose:  No deformity, discharge,  or lesions. Lungs:  Clear to auscultation bilaterally. No wheezes, rales, or rhonchi. No distress.  Heart:  S1, S2 present without murmurs appreciated.  Abdomen:  +BS, soft, and non-distended.  Very mild diffuse tenderness to palpation.  No HSM noted. No guarding or rebound. No masses appreciated.  Rectal:  Deferred  Msk:  Symmetrical without gross deformities. Normal posture. Extremities:  Without edema. Neurologic:  Alert and  oriented x4;  grossly normal neurologically. Skin:  Intact without significant lesions or rashes. Psych:  Normal mood and affect.

## 2019-02-20 ENCOUNTER — Other Ambulatory Visit (HOSPITAL_COMMUNITY)
Admission: RE | Admit: 2019-02-20 | Discharge: 2019-02-20 | Disposition: A | Discharge status: Home / Self Care | Payer: Medicaid Other | Source: Ambulatory Visit | Attending: Gastroenterology | Admitting: Gastroenterology

## 2019-02-20 ENCOUNTER — Other Ambulatory Visit: Payer: Self-pay

## 2019-02-20 ENCOUNTER — Ambulatory Visit (INDEPENDENT_AMBULATORY_CARE_PROVIDER_SITE_OTHER): Payer: Medicaid Other | Admitting: Gastroenterology

## 2019-02-20 ENCOUNTER — Encounter: Payer: Self-pay | Admitting: Gastroenterology

## 2019-02-20 VITALS — BP 129/82 | HR 80 | Temp 98.2°F | Ht 66.5 in | Wt 241.6 lb

## 2019-02-20 DIAGNOSIS — R7989 Other specified abnormal findings of blood chemistry: Secondary | ICD-10-CM

## 2019-02-20 DIAGNOSIS — R140 Abdominal distension (gaseous): Secondary | ICD-10-CM

## 2019-02-20 DIAGNOSIS — R197 Diarrhea, unspecified: Secondary | ICD-10-CM | POA: Diagnosis not present

## 2019-02-20 DIAGNOSIS — Z1159 Encounter for screening for other viral diseases: Secondary | ICD-10-CM | POA: Insufficient documentation

## 2019-02-20 DIAGNOSIS — R1310 Dysphagia, unspecified: Secondary | ICD-10-CM

## 2019-02-20 DIAGNOSIS — K219 Gastro-esophageal reflux disease without esophagitis: Secondary | ICD-10-CM | POA: Diagnosis not present

## 2019-02-20 LAB — COMPREHENSIVE METABOLIC PANEL
ALT: 65 U/L — ABNORMAL HIGH (ref 0–44)
AST: 38 U/L (ref 15–41)
Albumin: 3.9 g/dL (ref 3.5–5.0)
Alkaline Phosphatase: 74 U/L (ref 38–126)
Anion gap: 11 (ref 5–15)
BUN: 9 mg/dL (ref 6–20)
CO2: 22 mmol/L (ref 22–32)
Calcium: 9 mg/dL (ref 8.9–10.3)
Chloride: 104 mmol/L (ref 98–111)
Creatinine, Ser: 0.75 mg/dL (ref 0.44–1.00)
GFR calc Af Amer: >60 mL/min (ref >60)
GFR calc non Af Amer: >60 mL/min (ref >60)
Glucose, Bld: 97 mg/dL (ref 70–99)
Potassium: 4 mmol/L (ref 3.5–5.1)
Sodium: 137 mmol/L (ref 135–145)
Total Bilirubin: 0.4 mg/dL (ref 0.3–1.2)
Total Protein: 8 g/dL (ref 6.5–8.1)

## 2019-02-20 LAB — HEPATITIS C ANTIBODY: HCV Ab: NONREACTIVE

## 2019-02-20 MED ORDER — DICYCLOMINE HCL 10 MG PO CAPS: 10.0000 mg | ORAL_CAPSULE | Freq: Three times a day (TID) | ORAL | 0 refills | Status: DC

## 2019-02-20 MED ORDER — DEXLANSOPRAZOLE 60 MG PO CPDR: 60.0000 mg | DELAYED_RELEASE_CAPSULE | Freq: Every day | ORAL | 3 refills | Status: DC

## 2019-02-20 NOTE — Patient Instructions (Addendum)
Please have labs and ultrasound completed.  We will get you scheduled for an upper endoscopy with possible dilation of your esophagus with Dr. Gala Romney in the near future.    Please stop Protonix and start Dexilant 60 mg daily.  Please follow a GERD diet.  Avoid fatty, fried, spicy, greasy, and citrus foods.  Avoid caffeine, carbonated beverages, and chocolate.  Do not eat within 3 hours of laying down.  Prop the head of your bed up on bricks or wood to create a 6 inch incline.  See GERD handout below.  Please limit your use of ibuprofen.  This can create worsening inflammation within your stomach and intestines.  Try Tylenol instead.   You may add a probiotic daily to help with gas and bloating.    Avoid gas producing foods.  See handout below.  I am sending in Bentyl 10 mg for your abdominal cramping and diarrhea.  You can take this 3 times a day before meals.  Do not take if you are having constipation.  We will see back in the office after your procedure.  Call if questions or concerns prior to your next visit.  Aliene Altes, PA-C [facility]     Food Choices for Gastroesophageal Reflux Disease, Adult When you have gastroesophageal reflux disease (GERD), the foods you eat and your eating habits are very important. Choosing the right foods can help ease your discomfort. Think about working with a nutrition specialist (dietitian) to help you make good choices. What are tips for following this plan?  Meals  Choose healthy foods that are low in fat, such as fruits, vegetables, whole grains, low-fat dairy products, and lean meat, fish, and poultry.  Eat small meals often instead of 3 large meals a day. Eat your meals slowly, and in a place where you are relaxed. Avoid bending over or lying down until 2-3 hours after eating.  Avoid eating meals 2-3 hours before bed.  Avoid drinking a lot of liquid with meals.  Cook foods using methods other than frying. Bake, grill,  or broil food instead.  Avoid or limit: ? Chocolate. ? Peppermint or spearmint. ? Alcohol. ? Pepper. ? Black and decaffeinated coffee. ? Black and decaffeinated tea. ? Bubbly (carbonated) soft drinks. ? Caffeinated energy drinks and soft drinks.  Limit high-fat foods such as: ? Fatty meat or fried foods. ? Whole milk, cream, butter, or ice cream. ? Nuts and nut butters. ? Pastries, donuts, and sweets made with butter or shortening.  Avoid foods that cause symptoms. These foods may be different for everyone. Common foods that cause symptoms include: ? Tomatoes. ? Oranges, lemons, and limes. ? Peppers. ? Spicy food. ? Onions and garlic. ? Vinegar. Lifestyle  Maintain a healthy weight. Ask your doctor what weight is healthy for you. If you need to lose weight, work with your doctor to do so safely.  Exercise for at least 30 minutes for 5 or more days each week, or as told by your doctor.  Wear loose-fitting clothes.  Do not smoke. If you need help quitting, ask your doctor.  Sleep with the head of your bed higher than your feet. Use a wedge under the mattress or blocks under the bed frame to raise the head of the bed. Summary  When you have gastroesophageal reflux disease (GERD), food and lifestyle choices are very important in easing your symptoms.  Eat small meals often instead of 3 large meals a day. Eat your meals slowly, and in a  place where you are relaxed.  Limit high-fat foods such as fatty meat or fried foods.  Avoid bending over or lying down until 2-3 hours after eating.  Avoid peppermint and spearmint, caffeine, alcohol, and chocolate. This information is not intended to replace advice given to you by your health care provider. Make sure you discuss any questions you have with your health care provider. Document Released: 10/11/2011 Document Revised: 08/02/2018 Document Reviewed: 05/17/2016 Elsevier Patient Education  [employer].  Abdominal  Bloating When you have abdominal bloating, your abdomen may feel full, tight, or painful. It may also look bigger than normal or swollen (distended). Common causes of abdominal bloating include:  Swallowing air.  Constipation.  Problems digesting food.  Eating too much.  Irritable bowel syndrome. This is a condition that affects the large intestine.  Lactose intolerance. This is an inability to digest lactose, a natural sugar in dairy products.  Celiac disease. This is a condition that affects the ability to digest gluten, a protein found in some grains.  Gastroparesis. This is a condition that slows down the movement of food in the stomach and small intestine. It is more common in people with diabetes mellitus.  Gastroesophageal reflux disease (GERD). This is a digestive condition that makes stomach acid flow back into the esophagus.  Urinary retention. This means that the body is holding onto urine, and the bladder cannot be emptied all the way. Follow these instructions at home: Eating and drinking  Avoid eating too much.  Try not to swallow air while talking or eating.  Avoid eating while lying down.  Avoid these foods and drinks: ? Foods that cause gas, such as broccoli, cabbage, cauliflower, and baked beans. ? Carbonated drinks. ? Hard candy. ? Chewing gum. Medicines  Take over-the-counter and prescription medicines only as told by your health care provider.  Take probiotic medicines. These medicines contain live bacteria or yeasts that can help digestion.  Take coated peppermint oil capsules. Activity  Try to exercise regularly. Exercise may help to relieve bloating that is caused by gas and relieve constipation. General instructions  Keep all follow-up visits as told by your health care provider. This is important. Contact a health care provider if:  You have nausea and vomiting.  You have diarrhea.  You have abdominal pain.  You have unusual weight  loss or weight gain.  You have severe pain, and medicines do not help. Get help right away if:  You have severe chest pain.  You have trouble breathing.  You have shortness of breath.  You have trouble urinating.  You have darker urine than normal.  You have blood in your stools or have dark, tarry stools. Summary  Abdominal bloating means that the abdomen is swollen.  Common causes of abdominal bloating are swallowing air, constipation, and problems digesting food.  Avoid eating too much and avoid swallowing air.  Avoid foods that cause gas, carbonated drinks, hard candy, and chewing gum. This information is not intended to replace advice given to you by your health care provider. Make sure you discuss any questions you have with your health care provider. Document Released: 05/13/2016 Document Revised: 07/30/2018 Document Reviewed: 05/13/2016 Elsevier Patient Education  2020 [employer].

## 2019-02-20 NOTE — Assessment & Plan Note (Signed)
Patient is status post cholecystectomy in 2010.  Reports 2-3 postprandial loose to watery BMs daily with urgency.  Associated lower abdominal cramping prior to BMs have resolved thereafter.  Denies BRBPR, melena, nocturnal BMs, or unintentional weight loss.  She reports vague intermittent upper and lower abdominal pain otherwise.  Taking 400 mg ibuprofen a couple times a day 1-2 days a week.  Very mild diffuse tenderness to palpation on exam.   Suspect bile salt diarrhea versus IBS-D.  Will trial Bentyl 10 mg up to 3 times daily before meals to help with abdominal cramping and postprandial diarrhea with urgency.

## 2019-02-20 NOTE — Assessment & Plan Note (Signed)
Intermittent upper abdominal bloating. Identified broccoli and beans as significant tirgers of bloating as she was eating them daily. Since decreasing consumption of known triggers, she has noted improvement.    Advise she add a daily probiotic. Avoid gas producing foods specifically broccoli and beans. Handout provided to provide additional advise on foods/drinks to avoid.

## 2019-02-20 NOTE — Assessment & Plan Note (Addendum)
Dysphagia with sensation of pills and solid foods hanging at the sternal notch.  Occurring 1-2 times a week.  This is in the setting of uncontrolled GERD as addressed above.  Stop Protonix and start Dexilant 60 mg daily Reinforced GERD diet and lifestyle.  Handout provided. Advised she stop taking ibuprofen and avoid all NSAIDs. Proceed with EGD +/- dilation with propofol with Dr. Gala Romney in the near future. The risks, benefits, and alternatives have been discussed in detail with patient. They have stated understanding and desire to proceed. Propofol due to age.  Follow-up after procedure.

## 2019-02-20 NOTE — Assessment & Plan Note (Addendum)
Symptoms not ideally controlled.  Have been worsening since 2016 when she was pregnant with her second child.  Was associated with burning in her chest, remittent nausea and vomiting with just bending over.  Started on Protonix 6 months ago.  Burning is improved and patient has not vomited since starting Protonix; however, she continues with acid coming back up in her throat several days a week and nausea couple times a week, worse in the mornings.  Vague intermittent upper and lower abdominal pain difficult to characterize.  Also with dysphagia with sensation of pills and solid foods hanging at the sternal notch.  No BRBPR, melena, or unintentional weight loss.  Admits to 400 mg ibuprofen twice a day 1-2 days a week.  She is avoiding spicy and greasy foods.  No caffeine or soda.  Drinks 1 sparkling water a day. Abdominal exam with very mild diffuse tenderness to palpation.  Suspect GERD is likely influenced by dietary habits and body habitus. She may have gastritis, duodenitis, esophagitis possibly secondary to NSAIDs vs possible H. Pylori. Dysphagia may be secondary to esophageal stricture, ring, or web. Doubt malignancy.   Stop Protonix and start Dexilant 60 mg daily Reinforced GERD diet and lifestyle.  Handout provided. Advised she stop taking ibuprofen and avoid all NSAIDs. Proceed with EGD +/- dilation with propofol with Dr. Gala Romney in the near future. The risks, benefits, and alternatives have been discussed in detail with patient. They have stated understanding and desire to proceed.  Propofol due to age.  Follow-up after procedure.

## 2019-02-20 NOTE — Patient Instructions (Signed)
PA for US abdomen submitted via [employer]. Case approved. PA# [id number], valid 02/20/19-08/19/19.

## 2019-02-20 NOTE — Assessment & Plan Note (Addendum)
Recent labs in July 2020 with slight elevation of ALT at 61 with other LFTs within normal limits.  CBC also within normal limits.  Ultrasound in January 2019 with evidence of hepatic steatosis.  She did have slight elevation of LFTs in 2010 with AST 61, ALT 52, alk phos 123.  They normalized in 2012 and have remained normal until labs in July 2020.  States she was trying to lose weight and lost about 10-15 pounds about 1 month ago by following a low-carb diet.  She reports general sensation of abdominal swelling which she says is different than the upper abdominal bloating she has.    Otherwise no jaundice, confusion, easy bruising or bleeding, swelling in the lower extremities, or dark urine.  No history of drug use, no OTC supplements other than flaxseed, no family history of liver disease.  She does have a tattoo received a tattoo parlor. Abdomen was soft with very mild diffuse tenderness to palpation on exam.  At this point, will recheck CMP to follow LFT trend, check hep C antibody as this is recommended at least once and in a person's lifetime although she has no significant risk factors.  Will also obtain abdominal ultrasound to evaluate her abdominal swelling and reevaluate her liver.

## 2019-02-21 NOTE — Progress Notes (Signed)
ALT remained elevated and has increased slightly. Now 65, was 61 three months ago. Hep C negative. We are waiting on [place] scheduled on 02/26/19. Further recommendations to follow.   In the mean time, she does have fatty liver documented on [place] in 2019.  Instructions for fatty liver: Recommend 1-2# weight loss per week until ideal body weight through exercise & diet. Low fat/cholesterol diet.  Avoid sweets, sodas, fruit juices, sweetened beverages like tea, etc. Gradually increase exercise from 15 min daily up to 1 hr per day 5 days/week. Avoid alcohol use. Should also avoid OTC supplements or herbal teas or other herbal products.

## 2019-02-21 NOTE — Progress Notes (Signed)
cc'ed to pcp °

## 2019-02-26 ENCOUNTER — Other Ambulatory Visit: Payer: Self-pay

## 2019-02-26 ENCOUNTER — Telehealth: Payer: Self-pay | Admitting: Internal Medicine

## 2019-02-26 ENCOUNTER — Ambulatory Visit (HOSPITAL_COMMUNITY)
Admission: RE | Admit: 2019-02-26 | Discharge: 2019-02-26 | Disposition: A | Discharge status: Home / Self Care | Payer: Medicaid Other | Source: Ambulatory Visit | Attending: Gastroenterology | Admitting: Gastroenterology

## 2019-02-26 DIAGNOSIS — R140 Abdominal distension (gaseous): Secondary | ICD-10-CM | POA: Diagnosis present

## 2019-02-26 DIAGNOSIS — R7989 Other specified abnormal findings of blood chemistry: Secondary | ICD-10-CM | POA: Diagnosis not present

## 2019-02-26 IMAGING — US US ABDOMEN COMPLETE
1 series · 14 of 25 positions shown · non-contrast
Comparison: None.

CLINICAL DATA: Elevated liver enzymes.  Abdominal bloating

EXAM:
ABDOMEN ULTRASOUND COMPLETE

[Series 1: us abdomen complete · 14 of 88 slices shown]
[im 1/88]
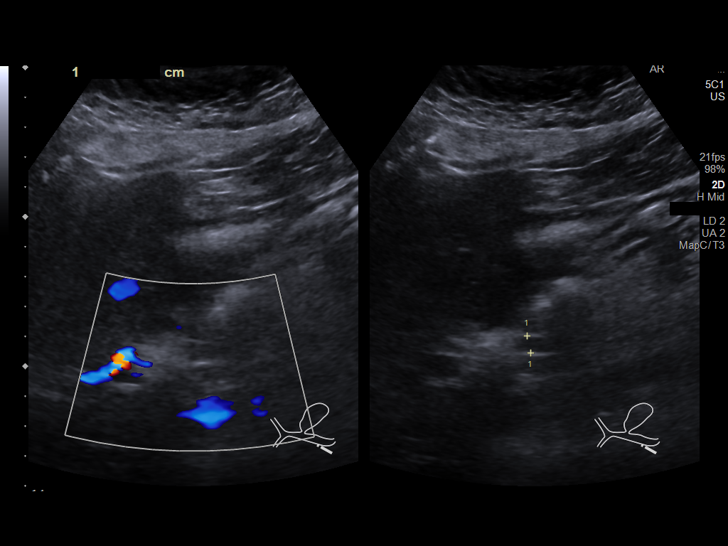
[im 8/88]
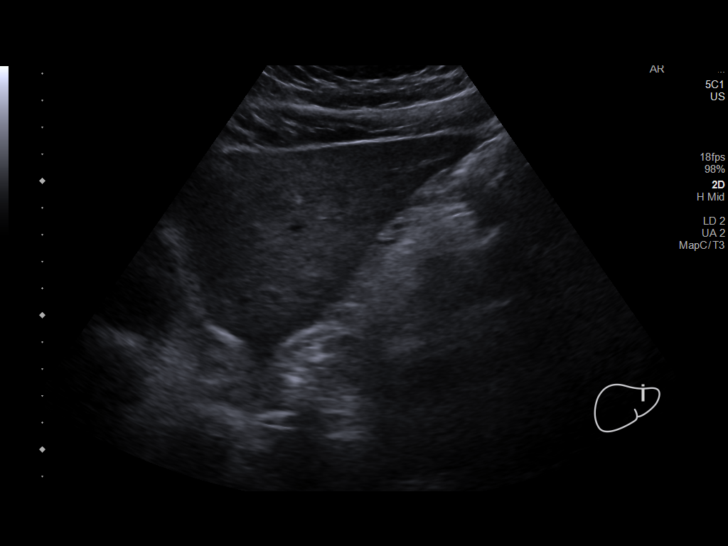
[im 15/88]
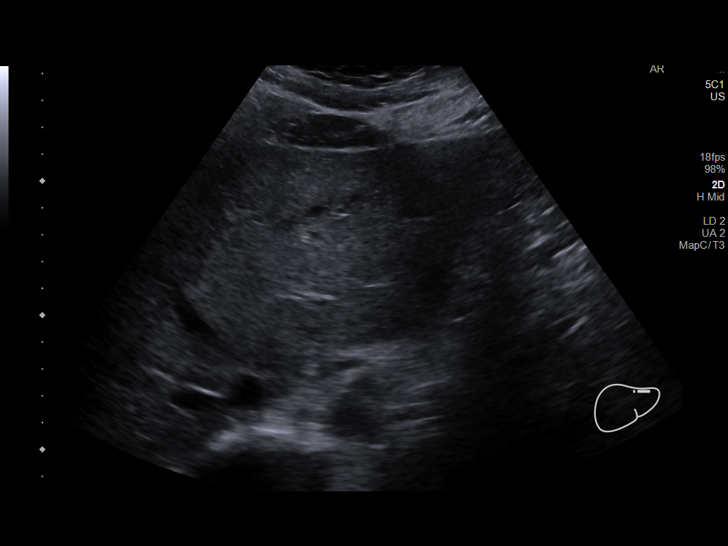
[im 22/88]
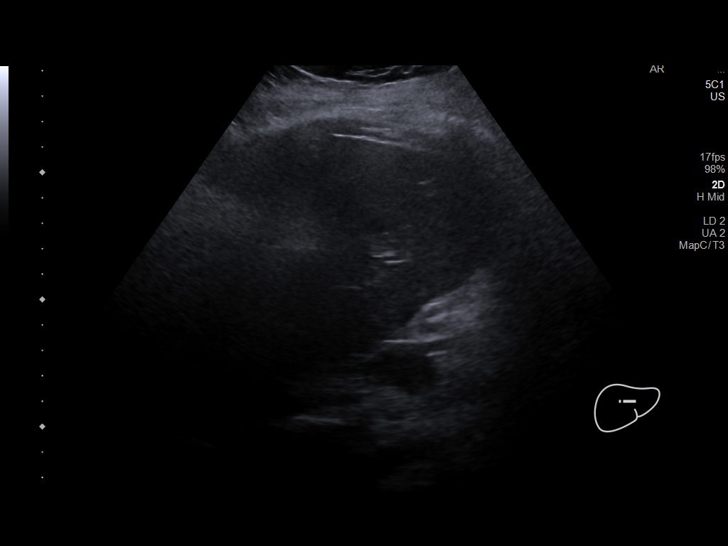
[im 30/88]
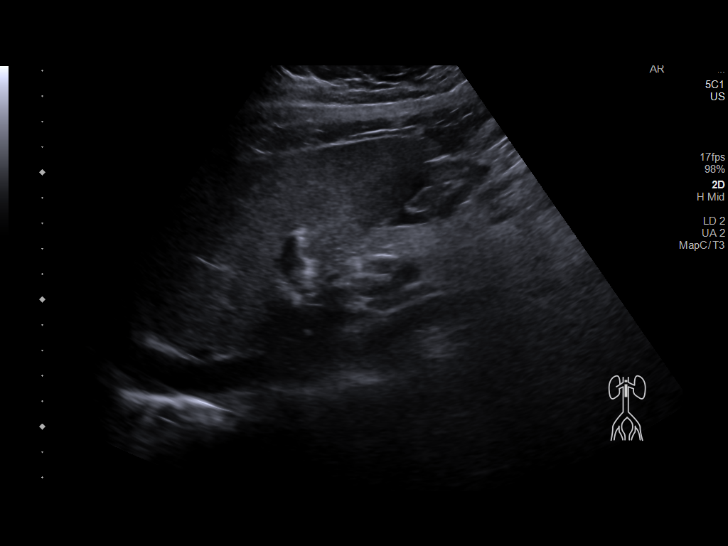
[im 33/88]
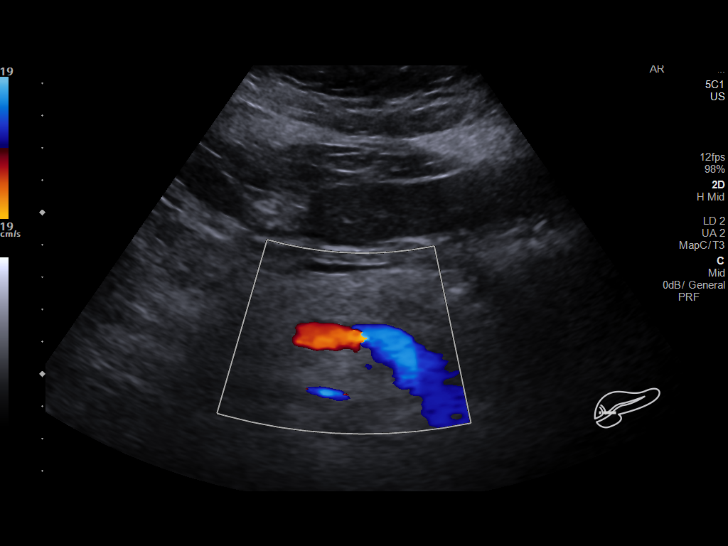
[im 40/88]
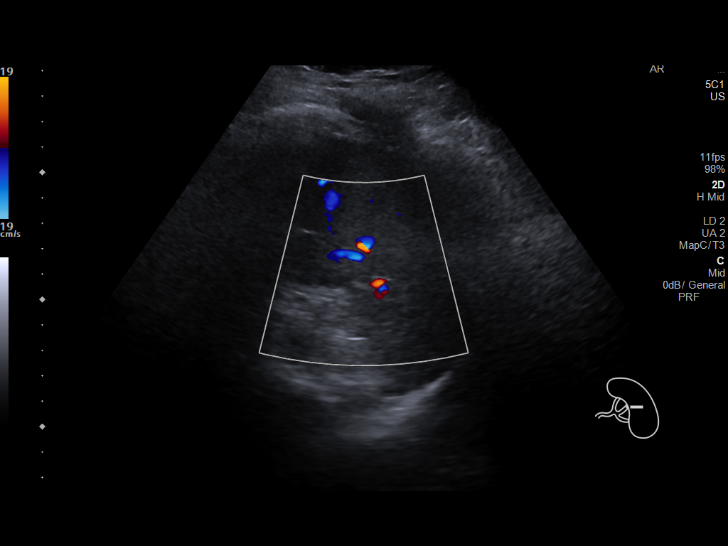
[im 48/88]
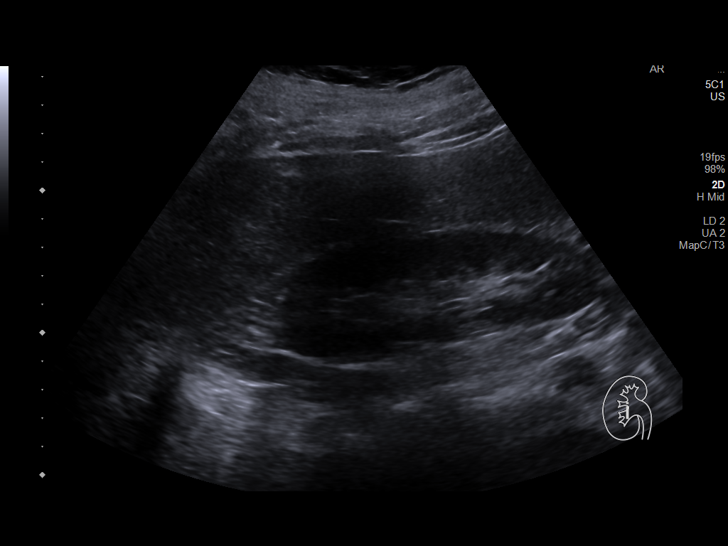
[im 55/88]
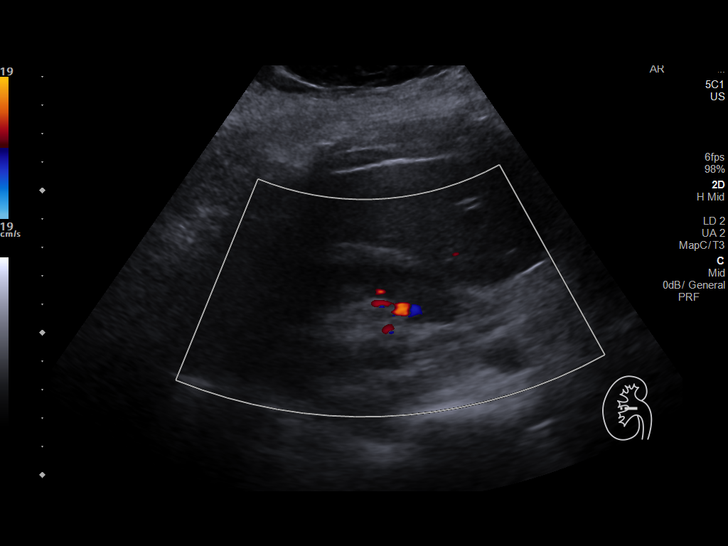
[im 59/88]
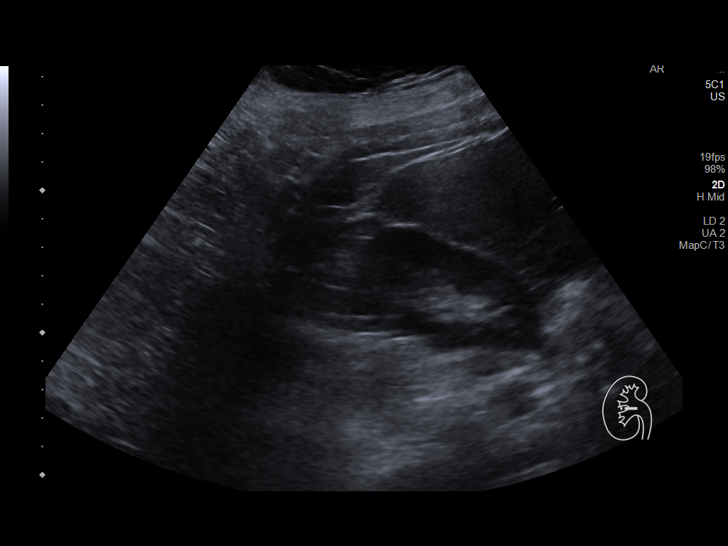
[im 66/88]
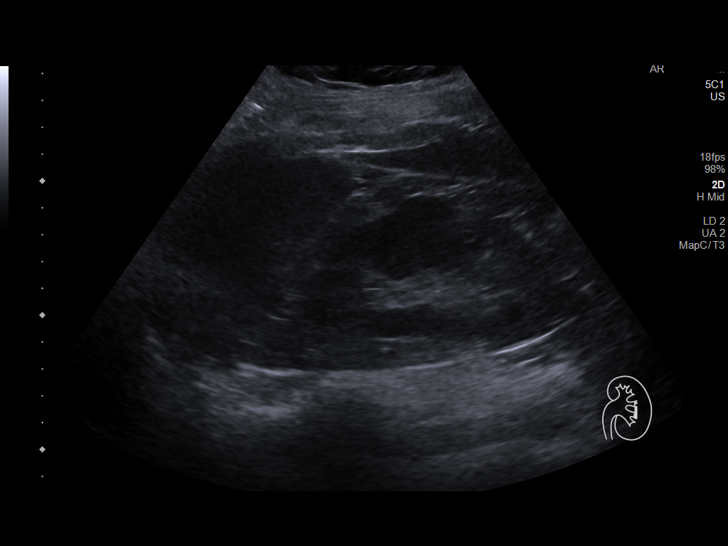
[im 73/88]
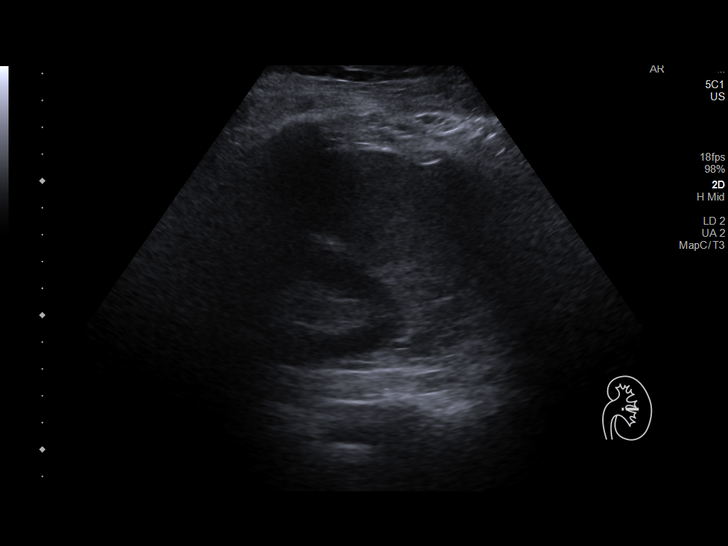
[im 80/88]
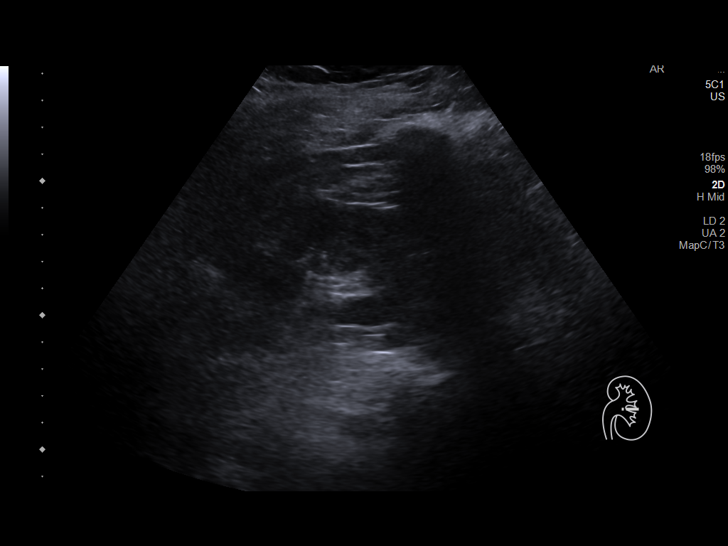
[im 88/88]
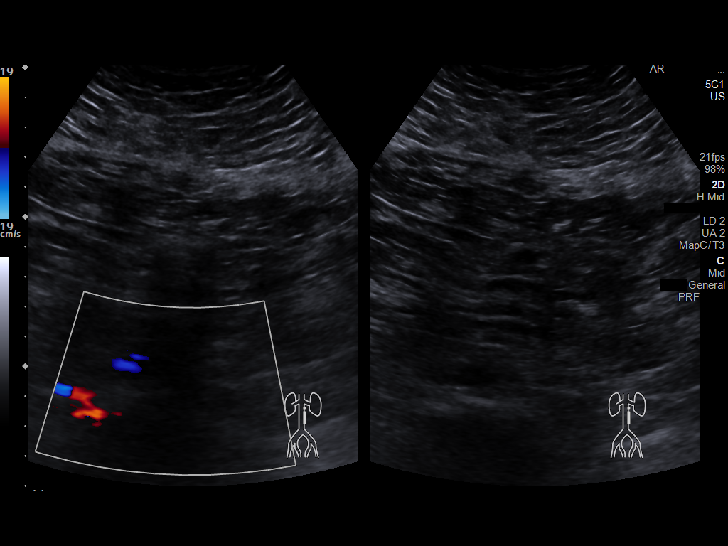

[14 of 25 positions shown; findings below may reference images not displayed]

FINDINGS: Gallbladder: Surgically absent.

Common bile duct: Diameter: 6 mm. No intrahepatic, common hepatic,
or common bile duct dilatation.

Liver: No focal lesion identified. Within normal limits in
parenchymal echogenicity. Portal vein is patent on color Doppler
imaging with normal direction of blood flow towards the liver.

IVC: No abnormality visualized.

Pancreas: No pancreatic mass or inflammatory focus.

Spleen: Size and appearance within normal limits.

Right Kidney: Length:11.0 cm. Echogenicity within normal limits. No
mass or hydronephrosis visualized.

Left Kidney: Length: 11.3 cm. Echogenicity within normal limits. No
mass or hydronephrosis visualized.

Abdominal aorta: No aneurysm visualized.

Other findings: No demonstrable ascites.
IMPRESSION: Gallbladder absent.  Study otherwise unremarkable.

## 2019-02-26 NOTE — Telephone Encounter (Signed)
PA for Dexilant was submitted through [email or web address] and was approved through 02/21/2020. Pt is aware of approval. Approval letter will be scanned in chart when received.

## 2019-02-26 NOTE — Telephone Encounter (Signed)
Patient called and said the medication she was prescribed for her reflux needs prior auth.  She could not remember the name of the medication, only that it started with the letter "d"

## 2019-02-28 NOTE — Progress Notes (Signed)
[place] within normal limits. Liver looks good. No concerning features at this time. Not sure why her ALT has been elevated recently. [place] in Jan 2019 did show evidence of fatty liver which I suspect may be playing a role. At this point, I would like to complete a few additional labs, have her continue to focus on diet, exercise, and weight loss as already discussed. Continue to avoid alcohol and OTC supplements. Limit tylenol.   Please place orders for Hep A Ab total, Hep B surface Ab, Hep B surface Ag, Hep B core Ab total, iron panel with ferritin, and INR. (Dx: Elevated LFTs, history of fatty liver)  If this is unremarkable, we will plan to recheck LFTs in 3 months and go from there.

## 2019-03-01 ENCOUNTER — Other Ambulatory Visit: Payer: Self-pay

## 2019-03-01 ENCOUNTER — Telehealth: Payer: Self-pay | Admitting: Cardiovascular Disease

## 2019-03-01 DIAGNOSIS — R7989 Other specified abnormal findings of blood chemistry: Secondary | ICD-10-CM

## 2019-03-01 DIAGNOSIS — Z8719 Personal history of other diseases of the digestive system: Secondary | ICD-10-CM

## 2019-03-01 NOTE — Telephone Encounter (Signed)
Fwd to provider

## 2019-03-01 NOTE — Telephone Encounter (Signed)
Patient notified.  Stated they have not asked her to stop the Corlanor.  Only the ASA & Ibuprofen type medications.  Stated that Dr. Bronson Ing mentioned putting her back on Topamax after she saw GI doctor due to her migraines.  Stated she is having frequently lately & could not continue to take the Tylenol, ASA or Ibuprofen.  Explained to patient that our doctors here typically do not prescribe these types of medications.  Advised that she should see her pmd first to see what they would like to prescribe for her & if they have questions about this being okay from a cardiac stand point - they may contact office for his input.  She verbalized understanding.

## 2019-03-01 NOTE — Telephone Encounter (Signed)
Was told by her GI doctor to stop taking all medications like aspirin, tylenol due to levels being high     would like to know what she needs to do

## 2019-03-01 NOTE — Telephone Encounter (Signed)
The only cardiac specific medication she is on is corlanor, which has no specific liver interactions. Any of her other meds I would follow the advice of her GI doctor or pcp. If they have asked her specifically to stop her corlanor for some reason then please verify this so we can get more information.    J Anayely Constantine MD

## 2019-04-03 NOTE — Patient Instructions (Signed)
Dana Mcpherson  04/03/2019     @PREFPERIOPPHARMACY @   Your procedure is scheduled on  04/08/2019 .  Report to [facility] at  [place].  Call this number if you have problems the morning of surgery:  [phone]   Remember:  Follow the diet and prep instructions given to you by dr Roseanne Kaufman office.   Take these medicines the morning of surgery with A SIP OF WATER  Corlanor, dexilant, topamax.    Do not wear jewelry, make-up or nail polish.  Do not wear lotions, powders, or perfumes. Please wear deodorant and brush your teeth.  Do not shave 48 hours prior to surgery.  Men may shave face and neck.  Do not bring valuables to the hospital.  [facility] is not responsible for any belongings or valuables.  Contacts, dentures or bridgework may not be worn into surgery.  Leave your suitcase in the car.  After surgery it may be brought to your room.  For patients admitted to the hospital, discharge time will be determined by your treatment team.  Patients discharged the day of surgery will not be allowed to drive home.   Name and phone number of your driver:   family Special instructions:  None  Please read over the following fact sheets that you were given. Anesthesia Post-op Instructions and Care and Recovery After Surgery       Upper Endoscopy, Adult, Care After This sheet gives you information about how to care for yourself after your procedure. Your health care provider may also give you more specific instructions. If you have problems or questions, contact your health care provider. What can I expect after the procedure? After the procedure, it is common to have:  A sore throat.  Mild stomach pain or discomfort.  Bloating.  Nausea. Follow these instructions at home:   Follow instructions from your health care provider about what to eat or drink after your procedure.  Return to your normal activities as told by your health care provider. Ask your  health care provider what activities are safe for you.  Take over-the-counter and prescription medicines only as told by your health care provider.  Do not drive for 24 hours if you were given a sedative during your procedure.  Keep all follow-up visits as told by your health care provider. This is important. Contact a health care provider if you have:  A sore throat that lasts longer than one day.  Trouble swallowing. Get help right away if:  You vomit blood or your vomit looks like coffee grounds.  You have: ? A fever. ? Bloody, black, or tarry stools. ? A severe sore throat or you cannot swallow. ? Difficulty breathing. ? Severe pain in your chest or abdomen. Summary  After the procedure, it is common to have a sore throat, mild stomach discomfort, bloating, and nausea.  Do not drive for 24 hours if you were given a sedative during the procedure.  Follow instructions from your health care provider about what to eat or drink after your procedure.  Return to your normal activities as told by your health care provider. This information is not intended to replace advice given to you by your health care provider. Make sure you discuss any questions you have with your health care provider. Document Released: 10/11/2011 Document Revised: 10/03/2017 Document Reviewed: 09/11/2017 Elsevier Patient Education  [place].  Esophageal Dilatation Esophageal dilatation, also called esophageal  dilation, is a procedure to widen or open (dilate) a blocked or narrowed part of the esophagus. The esophagus is the part of the body that moves food and liquid from the mouth to the stomach. You may need this procedure if:  You have a buildup of scar tissue in your esophagus that makes it difficult, painful, or impossible to swallow. This can be caused by gastroesophageal reflux disease (GERD).  You have cancer of the esophagus.  There is a problem with how food moves through your  esophagus. In some cases, you may need this procedure repeated at a later time to dilate the esophagus gradually. Tell a health care provider about:  Any allergies you have.  All medicines you are taking, including vitamins, herbs, eye drops, creams, and over-the-counter medicines.  Any problems you or family members have had with anesthetic medicines.  Any blood disorders you have.  Any surgeries you have had.  Any medical conditions you have.  Any antibiotic medicines you are required to take before dental procedures.  Whether you are pregnant or may be pregnant. What are the risks? Generally, this is a safe procedure. However, problems may occur, including:  Bleeding due to a tear in the lining of the esophagus.  A hole (perforation) in the esophagus. What happens before the procedure?  Follow instructions from your health care provider about eating or drinking restrictions.  Ask your health care provider about changing or stopping your regular medicines. This is especially important if you are taking diabetes medicines or blood thinners.  Plan to have someone take you home from the hospital or clinic.  Plan to have a responsible adult care for you for at least 24 hours after you leave the hospital or clinic. This is important. What happens during the procedure?  You may be given a medicine to help you relax (sedative).  A numbing medicine may be sprayed into the back of your throat, or you may gargle the medicine.  Your health care provider may perform the dilatation using various surgical instruments, such as: ? Simple dilators. This instrument is carefully placed in the esophagus to stretch it. ? Guided wire bougies. This involves using an endoscope to insert a wire into the esophagus. A dilator is passed over this wire to enlarge the esophagus. Then the wire is removed. ? Balloon dilators. An endoscope with a small balloon at the end is inserted into the esophagus.  The balloon is inflated to stretch the esophagus and open it up. The procedure may vary among health care providers and hospitals. What happens after the procedure?  Your blood pressure, heart rate, breathing rate, and blood oxygen level will be monitored until the medicines you were given have worn off.  Your throat may feel slightly sore and numb. This will improve slowly over time.  You will not be allowed to eat or drink until your throat is no longer numb.  When you are able to drink, urinate, and sit on the edge of the bed without nausea or dizziness, you may be able to return home. Follow these instructions at home:  Take over-the-counter and prescription medicines only as told by your health care provider.  Do not drive for 24 hours if you were given a sedative during your procedure.  You should have a responsible adult with you for 24 hours after the procedure.  Follow instructions from your health care provider about any eating or drinking restrictions.  Do not use any products that contain nicotine  or tobacco, such as cigarettes and e-cigarettes. If you need help quitting, ask your health care provider.  Keep all follow-up visits as told by your health care provider. This is important. Get help right away if you:  Have a fever.  Have chest pain.  Have pain that is not relieved by medication.  Have trouble breathing.  Have trouble swallowing.  Vomit blood. Summary  Esophageal dilatation, also called esophageal dilation, is a procedure to widen or open (dilate) a blocked or narrowed part of the esophagus.  Plan to have someone take you home from the hospital or clinic.  For this procedure, a numbing medicine may be sprayed into the back of your throat, or you may gargle the medicine.  Do not drive for 24 hours if you were given a sedative during your procedure. This information is not intended to replace advice given to you by your health care provider. Make  sure you discuss any questions you have with your health care provider. Document Released: 06/02/2005 Document Revised: 03/24/2017 Document Reviewed: 02/14/2017 Elsevier Patient Education  2020 [place] After These instructions provide you with information about caring for yourself after your procedure. Your health care provider may also give you more specific instructions. Your treatment has been planned according to current medical practices, but problems sometimes occur. Call your health care provider if you have any problems or questions after your procedure. What can I expect after the procedure? After your procedure, you may:  Feel sleepy for several hours.  Feel clumsy and have poor balance for several hours.  Feel forgetful about what happened after the procedure.  Have poor judgment for several hours.  Feel nauseous or vomit.  Have a sore throat if you had a breathing tube during the procedure. Follow these instructions at home: For at least 24 hours after the procedure:      Have a responsible adult stay with you. It is important to have someone help care for you until you are awake and alert.  Rest as needed.  Do not: ? Participate in activities in which you could fall or become injured. ? Drive. ? Use heavy machinery. ? Drink alcohol. ? Take sleeping pills or medicines that cause drowsiness. ? Make important decisions or sign legal documents. ? Take care of children on your own. Eating and drinking  Follow the diet that is recommended by your health care provider.  If you vomit, drink water, juice, or soup when you can drink without vomiting.  Make sure you have little or no nausea before eating solid foods. General instructions  Take over-the-counter and prescription medicines only as told by your health care provider.  If you have sleep apnea, surgery and certain medicines can increase your risk for breathing  problems. Follow instructions from your health care provider about wearing your sleep device: ? Anytime you are sleeping, including during daytime naps. ? While taking prescription pain medicines, sleeping medicines, or medicines that make you drowsy.  If you smoke, do not smoke without supervision.  Keep all follow-up visits as told by your health care provider. This is important. Contact a health care provider if:  You keep feeling nauseous or you keep vomiting.  You feel light-headed.  You develop a rash.  You have a fever. Get help right away if:  You have trouble breathing. Summary  For several hours after your procedure, you may feel sleepy and have poor judgment.  Have a responsible adult stay with  you for at least 24 hours or until you are awake and alert. This information is not intended to replace advice given to you by your health care provider. Make sure you discuss any questions you have with your health care provider. Document Released: 08/02/2015 Document Revised: 07/10/2017 Document Reviewed: 08/02/2015 Elsevier Patient Education  2020 [employer].

## 2019-04-05 ENCOUNTER — Encounter (HOSPITAL_COMMUNITY): Payer: Self-pay

## 2019-04-05 ENCOUNTER — Other Ambulatory Visit (HOSPITAL_COMMUNITY)
Admission: RE | Admit: 2019-04-05 | Discharge: 2019-04-05 | Disposition: A | Discharge status: Home / Self Care | Payer: Medicaid Other | Source: Ambulatory Visit | Attending: Internal Medicine | Admitting: Internal Medicine

## 2019-04-05 ENCOUNTER — Encounter (HOSPITAL_COMMUNITY)
Admission: RE | Admit: 2019-04-05 | Discharge: 2019-04-05 | Disposition: A | Discharge status: Home / Self Care | Payer: Medicaid Other | Source: Ambulatory Visit | Attending: Internal Medicine | Admitting: Internal Medicine

## 2019-04-05 ENCOUNTER — Other Ambulatory Visit: Payer: Self-pay

## 2019-04-05 DIAGNOSIS — Z20828 Contact with and (suspected) exposure to other viral communicable diseases: Secondary | ICD-10-CM | POA: Insufficient documentation

## 2019-04-05 DIAGNOSIS — R140 Abdominal distension (gaseous): Secondary | ICD-10-CM | POA: Diagnosis not present

## 2019-04-05 DIAGNOSIS — R002 Palpitations: Secondary | ICD-10-CM | POA: Insufficient documentation

## 2019-04-05 DIAGNOSIS — Z01818 Encounter for other preprocedural examination: Secondary | ICD-10-CM | POA: Insufficient documentation

## 2019-04-05 DIAGNOSIS — R1310 Dysphagia, unspecified: Secondary | ICD-10-CM | POA: Insufficient documentation

## 2019-04-05 DIAGNOSIS — R000 Tachycardia, unspecified: Secondary | ICD-10-CM | POA: Diagnosis not present

## 2019-04-05 DIAGNOSIS — K219 Gastro-esophageal reflux disease without esophagitis: Secondary | ICD-10-CM | POA: Insufficient documentation

## 2019-04-05 HISTORY — DX: Family history of other specified conditions: Z84.89

## 2019-04-05 HISTORY — DX: Gastro-esophageal reflux disease without esophagitis: K21.9

## 2019-04-05 HISTORY — DX: Unspecified asthma, uncomplicated: J45.909

## 2019-04-05 LAB — COMPREHENSIVE METABOLIC PANEL
ALT: 56 U/L — ABNORMAL HIGH (ref 0–44)
AST: 30 U/L (ref 15–41)
Albumin: 4.1 g/dL (ref 3.5–5.0)
Alkaline Phosphatase: 83 U/L (ref 38–126)
Anion gap: 12 (ref 5–15)
BUN: 9 mg/dL (ref 6–20)
CO2: 17 mmol/L — ABNORMAL LOW (ref 22–32)
Calcium: 8.9 mg/dL (ref 8.9–10.3)
Chloride: 109 mmol/L (ref 98–111)
Creatinine, Ser: 0.79 mg/dL (ref 0.44–1.00)
GFR calc Af Amer: >60 mL/min (ref >60)
GFR calc non Af Amer: >60 mL/min (ref >60)
Glucose, Bld: 84 mg/dL (ref 70–99)
Potassium: 3.7 mmol/L (ref 3.5–5.1)
Sodium: 138 mmol/L (ref 135–145)
Total Bilirubin: 0.4 mg/dL (ref 0.3–1.2)
Total Protein: 7.9 g/dL (ref 6.5–8.1)

## 2019-04-05 LAB — PROTIME-INR
INR: 0.9 (ref 0.8–1.2)
Prothrombin Time: 12.4 seconds (ref 11.4–15.2)

## 2019-04-05 LAB — HEPATITIS A ANTIBODY, TOTAL: hep A Total Ab: NONREACTIVE

## 2019-04-05 LAB — CBC WITH DIFFERENTIAL/PLATELET
Abs Immature Granulocytes: 0.03 10*3/uL (ref 0.00–0.07)
Basophils Absolute: 0 10*3/uL (ref 0.0–0.1)
Basophils Relative: 0 %
Eosinophils Absolute: 0.2 10*3/uL (ref 0.0–0.5)
Eosinophils Relative: 2 %
HCT: 42.4 % (ref 36.0–46.0)
Hemoglobin: 13.6 g/dL (ref 12.0–15.0)
Immature Granulocytes: 0 %
Lymphocytes Relative: 23 %
Lymphs Abs: 2.4 10*3/uL (ref 0.7–4.0)
MCH: 27.8 pg (ref 26.0–34.0)
MCHC: 32.1 g/dL (ref 30.0–36.0)
MCV: 86.7 fL (ref 80.0–100.0)
Monocytes Absolute: 0.6 10*3/uL (ref 0.1–1.0)
Monocytes Relative: 6 %
Neutro Abs: 7.4 10*3/uL (ref 1.7–7.7)
Neutrophils Relative %: 69 %
Platelets: 322 10*3/uL (ref 150–400)
RBC: 4.89 MIL/uL (ref 3.87–5.11)
RDW: 13.3 % (ref 11.5–15.5)
WBC: 10.7 10*3/uL — ABNORMAL HIGH (ref 4.0–10.5)
nRBC: 0 % (ref 0.0–0.2)

## 2019-04-05 LAB — HEPATITIS B SURFACE ANTIGEN: Hepatitis B Surface Ag: NONREACTIVE

## 2019-04-05 LAB — HEPATITIS B CORE ANTIBODY, IGM: Hep B C IgM: NONREACTIVE

## 2019-04-05 LAB — IRON AND TIBC
Iron: 51 ug/dL (ref 28–170)
Saturation Ratios: 14 % (ref 10.4–31.8)
TIBC: 359 ug/dL (ref 250–450)
UIBC: 308 ug/dL

## 2019-04-05 LAB — HEPATITIS B CORE ANTIBODY, TOTAL: Hep B Core Total Ab: NONREACTIVE

## 2019-04-05 LAB — HCG, SERUM, QUALITATIVE: Preg, Serum: NEGATIVE

## 2019-04-05 LAB — SARS CORONAVIRUS 2 (TAT 6-24 HRS): SARS Coronavirus 2: NEGATIVE

## 2019-04-05 LAB — FERRITIN: Ferritin: 89 ng/mL (ref 11–307)

## 2019-04-05 NOTE — Pre-Procedure Instructions (Signed)
In for PAT prior to EGD with Dr Gala Romney. Asked about anesthesia complications and she states, " when I had my gallbladder out 10 years ago, on my way home I started itching severly and it felt like fire ants were biting me. My Mom called Dr Geroge Baseman when we got home and they thought it was some sort of complication with anesthesia." She could not remember the name of the drug she was given nor could she remember what if any pain meds that were given in recovery room. I tried to find the old record but could not see anything but the op-note. Nothing from that far back was in epic about the anesthetic or recovery stay. Spoke with Dr Charna Elizabeth, he is aware and gives no further orders at this time.

## 2019-04-08 ENCOUNTER — Ambulatory Visit (HOSPITAL_COMMUNITY): Payer: Medicaid Other | Admitting: Anesthesiology

## 2019-04-08 ENCOUNTER — Ambulatory Visit (HOSPITAL_COMMUNITY)
Admission: RE | Admit: 2019-04-08 | Discharge: 2019-04-08 | Disposition: A | Discharge status: Home / Self Care | Payer: Medicaid Other | Attending: Internal Medicine | Admitting: Internal Medicine

## 2019-04-08 ENCOUNTER — Encounter (HOSPITAL_COMMUNITY)
Admission: RE | Disposition: A | Discharge status: Home / Self Care | Payer: Self-pay | Source: Home / Self Care | Attending: Internal Medicine

## 2019-04-08 ENCOUNTER — Encounter (HOSPITAL_COMMUNITY): Payer: Self-pay | Admitting: Internal Medicine

## 2019-04-08 DIAGNOSIS — R519 Headache, unspecified: Secondary | ICD-10-CM | POA: Insufficient documentation

## 2019-04-08 DIAGNOSIS — R1310 Dysphagia, unspecified: Secondary | ICD-10-CM | POA: Diagnosis present

## 2019-04-08 DIAGNOSIS — K2100 Gastro-esophageal reflux disease with esophagitis, without bleeding: Secondary | ICD-10-CM | POA: Insufficient documentation

## 2019-04-08 DIAGNOSIS — I10 Essential (primary) hypertension: Secondary | ICD-10-CM | POA: Insufficient documentation

## 2019-04-08 DIAGNOSIS — Z793 Long term (current) use of hormonal contraceptives: Secondary | ICD-10-CM | POA: Diagnosis not present

## 2019-04-08 DIAGNOSIS — K219 Gastro-esophageal reflux disease without esophagitis: Secondary | ICD-10-CM

## 2019-04-08 DIAGNOSIS — J45909 Unspecified asthma, uncomplicated: Secondary | ICD-10-CM | POA: Insufficient documentation

## 2019-04-08 DIAGNOSIS — Z87891 Personal history of nicotine dependence: Secondary | ICD-10-CM | POA: Diagnosis not present

## 2019-04-08 DIAGNOSIS — R140 Abdominal distension (gaseous): Secondary | ICD-10-CM

## 2019-04-08 DIAGNOSIS — Z79899 Other long term (current) drug therapy: Secondary | ICD-10-CM | POA: Insufficient documentation

## 2019-04-08 DIAGNOSIS — K2210 Ulcer of esophagus without bleeding: Secondary | ICD-10-CM | POA: Diagnosis not present

## 2019-04-08 HISTORY — PX: ESOPHAGOGASTRODUODENOSCOPY (EGD) WITH PROPOFOL: SHX5813

## 2019-04-08 HISTORY — PX: MALONEY DILATION: SHX5535

## 2019-04-08 SURGERY — ESOPHAGOGASTRODUODENOSCOPY (EGD) WITH PROPOFOL
Anesthesia: General

## 2019-04-08 MED ORDER — KETAMINE HCL 10 MG/ML IJ SOLN
INTRAMUSCULAR | Status: DC | PRN
  Administered 2019-04-08: 20 mg via INTRAVENOUS

## 2019-04-08 MED ORDER — GLYCOPYRROLATE 0.2 MG/ML IJ SOLN
INTRAMUSCULAR | Status: DC | PRN
  Administered 2019-04-08: .2 mg via INTRAVENOUS

## 2019-04-08 MED ORDER — PROMETHAZINE HCL 25 MG/ML IJ SOLN: 6.2500 mg | INTRAMUSCULAR | Status: DC | PRN

## 2019-04-08 MED ORDER — LIDOCAINE HCL (CARDIAC) PF 100 MG/5ML IV SOSY
PREFILLED_SYRINGE | INTRAVENOUS | Status: DC | PRN
  Administered 2019-04-08: 50 mg via INTRATRACHEAL

## 2019-04-08 MED ORDER — LACTATED RINGERS IV SOLN
INTRAVENOUS | Status: DC
  Administered 2019-04-08: 1000 mL via INTRAVENOUS

## 2019-04-08 MED ORDER — ONDANSETRON HCL 4 MG/2ML IJ SOLN
INTRAMUSCULAR | Status: AC
  Filled 2019-04-08: qty 2

## 2019-04-08 MED ORDER — MIDAZOLAM HCL 2 MG/2ML IJ SOLN: 0.5000 mg | Freq: Once | INTRAMUSCULAR | Status: DC | PRN

## 2019-04-08 MED ORDER — PROPOFOL 10 MG/ML IV BOLUS
INTRAVENOUS | Status: DC | PRN
  Administered 2019-04-08: 100 mg via INTRAVENOUS

## 2019-04-08 MED ORDER — CHLORHEXIDINE GLUCONATE CLOTH 2 % EX PADS: 6.0000 | MEDICATED_PAD | Freq: Once | CUTANEOUS | Status: DC

## 2019-04-08 MED ORDER — KETAMINE HCL 50 MG/5ML IJ SOSY
PREFILLED_SYRINGE | INTRAMUSCULAR | Status: AC
  Filled 2019-04-08: qty 5

## 2019-04-08 MED ORDER — GLYCOPYRROLATE PF 0.2 MG/ML IJ SOSY
PREFILLED_SYRINGE | INTRAMUSCULAR | Status: AC
  Filled 2019-04-08: qty 1

## 2019-04-08 MED ORDER — HYDROCODONE-ACETAMINOPHEN 7.5-325 MG PO TABS: 1.0000 | ORAL_TABLET | Freq: Once | ORAL | Status: DC | PRN

## 2019-04-08 MED ORDER — HYDROMORPHONE HCL 1 MG/ML IJ SOLN: 0.2500 mg | INTRAMUSCULAR | Status: DC | PRN

## 2019-04-08 MED ORDER — PROPOFOL 500 MG/50ML IV EMUL
INTRAVENOUS | Status: DC | PRN
  Administered 2019-04-08: 300 ug/kg/min via INTRAVENOUS

## 2019-04-08 MED ORDER — ONDANSETRON HCL 4 MG/2ML IJ SOLN
INTRAMUSCULAR | Status: DC | PRN
  Administered 2019-04-08: 4 mg via INTRAVENOUS

## 2019-04-08 NOTE — Transfer of Care (Signed)
Immediate Anesthesia Transfer of Care Note  Patient: Dana Mcpherson  Procedure(s) Performed: ESOPHAGOGASTRODUODENOSCOPY (EGD) WITH PROPOFOL (N/A ) MALONEY DILATION (N/A )  Patient Location: PACU  Anesthesia Type:General  Level of Consciousness: awake, alert  and oriented  Airway & Oxygen Therapy: Patient Spontanous Breathing  Post-op Assessment: Report given to RN, Post -op Vital signs reviewed and stable and Patient moving all extremities X 4  Post vital signs: Reviewed and stable  Last Vitals:  Vitals Value Taken Time  BP    Temp    Pulse 72 04/08/19 1124  Resp 13 04/08/19 1124  SpO2 98 % 04/08/19 1124  Vitals shown include unvalidated device data.  Last Pain:  Vitals:   04/08/19 0956  TempSrc: Oral  PainSc: 0-No pain      Patients Stated Pain Goal: 9 ([id number])  Complications: No apparent anesthesia complications

## 2019-04-08 NOTE — Anesthesia Preprocedure Evaluation (Signed)
Anesthesia Evaluation  Patient identified by MRN, date of birth, ID band Patient awake  General Assessment Comment:Reports Post op itching -states mom had it too   Reviewed: Allergy & Precautions, NPO status , Patient's Chart, lab work & pertinent test results  History of Anesthesia Complications (+) Family history of anesthesia reaction and history of anesthetic complications  Airway Mallampati: II  TM Distance: >3 FB Neck ROM: Full    Dental no notable dental hx. (+) Teeth Intact   Pulmonary neg pulmonary ROS, former smoker,    Pulmonary exam normal breath sounds clear to auscultation       Cardiovascular Exercise Tolerance: Poor hypertension, Pt. on medications and Pt. on home beta blockers negative cardio ROS Normal cardiovascular examII Rhythm:Regular Rate:Normal  Pt on corlanor - ? For rate control (often used for CHF?)  States ET decreased because h/o tachycardia -w/u unremarkable    Neuro/Psych  Headaches, Anxiety negative psych ROS   GI/Hepatic Neg liver ROS, GERD  ,  Endo/Other  negative endocrine ROSBMI ~38  Renal/GU negative Renal ROS  negative genitourinary   Musculoskeletal negative musculoskeletal ROS (+)   Abdominal   Peds negative pediatric ROS (+)  Hematology negative hematology ROS (+)   Anesthesia Other Findings   Reproductive/Obstetrics negative OB ROS                             Anesthesia Physical Anesthesia Plan  ASA: III  Anesthesia Plan: General   Post-op Pain Management:    Induction: Intravenous  PONV Risk Score and Plan: 3 and TIVA, Midazolam, Ondansetron, Treatment may vary due to age or medical condition and Propofol infusion  Airway Management Planned: Simple Face Mask and Nasal Cannula  Additional Equipment:   Intra-op Plan:   Post-operative Plan:   Informed Consent: I have reviewed the patients History and Physical, chart, labs and  discussed the procedure including the risks, benefits and alternatives for the proposed anesthesia with the patient or authorized representative who has indicated his/her understanding and acceptance.     Dental advisory given  Plan Discussed with: CRNA  Anesthesia Plan Comments: (Plan Full PPE use  Plan GA with GETA as needed d/w pt -WTP with same after Q&A  Will use Esmolol or Metoprolol for rate control as needed )        Anesthesia Quick Evaluation

## 2019-04-08 NOTE — H&P (Signed)
@LOGO @   Primary Care Physician:  Sharilyn Sites, MD Primary Gastroenterologist:  Dr. Gala Romney  Pre-Procedure History & Physical: HPI:  Dana Mcpherson is a 28 y.o. female here for   Past Medical History:  Diagnosis Date  . Allergy   . Anxiety   . Asthma   . Complication of anesthesia    woke up during surgery & ithing after surgery  . Family history of adverse reaction to anesthesia    "My mom has the same trouble with anesthesia".  . GERD (gastroesophageal reflux disease)   . Migraine headache   . Nexplanon in place 10/05/2015   06/29/15 left arm  . Polycystic ovarian syndrome   . Pregnancy induced hypertension   . Tachycardia   . Vaginal Pap smear, abnormal     Past Surgical History:  Procedure Laterality Date  . CHOLECYSTECTOMY  2010  . ESOPHAGOGASTRODUODENOSCOPY      Prior to Admission medications   Medication Sig Start Date End Date Taking? Authorizing Provider  CORLANOR 5 MG TABS tablet TAKE 1 & 1/2 TABLETS BY MOUTH 2 TIMES A DAY WITH A MEAL. Patient taking differently: Take 7.5 mg by mouth 2 (two) times daily.  01/29/19  Yes Herminio Commons, MD  dexlansoprazole (DEXILANT) 60 MG capsule Take 1 capsule (60 mg total) by mouth daily. 02/20/19  Yes Erenest Rasher, PA-C  dicyclomine (BENTYL) 10 MG capsule Take 1 capsule (10 mg total) by mouth 4 (four) times daily -  before meals and at bedtime. Patient taking differently: Take 10 mg by mouth 4 (four) times daily as needed (cramps).  02/20/19  Yes Erenest Rasher, PA-C  diphenhydrAMINE (BENADRYL) 25 MG tablet Take 12.5-25 mg by mouth at bedtime as needed for itching.   Yes [provider]  Flaxseed, Linseed, (FLAXSEED OIL) 1200 MG CAPS Take 1,200 mg by mouth 2 (two) times daily.   Yes [provider]  ibuprofen (ADVIL) 200 MG tablet Take 400-600 mg by mouth every 4 (four) hours as needed for headache.   Yes [provider]  INTROVALE 0.15-0.03 MG tablet TAKE 1T BY MOUTH EVERY DAY Patient  taking differently: Take 1 tablet by mouth at bedtime.  03/21/18  Yes Cresenzo-Dishmon, Joaquim Lai, CNM  topiramate (TOPAMAX) 25 MG capsule Take 50 mg by mouth 2 (two) times daily.   Yes [provider]    Allergies as of 02/20/2019 - Review Complete 02/20/2019  Allergen Reaction Noted  . Other Anaphylaxis and Other (See Comments) 08/30/2012  . Shellfish allergy Anaphylaxis and Hives 05/20/2013  . Adhesive [tape] Hives 10/10/2014  . Eggs or egg-derived products Itching and Nausea Only 07/05/2012  . Latex Itching 11/04/2014    Family History  Problem Relation Age of Onset  . Cancer Paternal Grandmother        ovarian cancer  . Asthma Son   . ADD / ADHD Son   . Colon cancer Other        late 49's    Social History   Socioeconomic History  . Marital status: Married    Spouse name: Not on file  . Number of children: Not on file  . Years of education: Not on file  . Highest education level: Not on file  Occupational History  . Not on file  Tobacco Use  . Smoking status: Former Smoker    Packs/day: 0.25    Years: 2.00    Pack years: 0.50    Types: Cigarettes    Quit date: 04/04/2010  Years since quitting: 9.0  . Smokeless tobacco: Never Used  Substance and Sexual Activity  . Alcohol use: No  . Drug use: No  . Sexual activity: Yes    Birth control/protection: Pill  Other Topics Concern  . Not on file  Social History Narrative  . Not on file   Social Determinants of Health   Financial Resource Strain:   . Difficulty of Paying Living Expenses: Not on file  Food Insecurity:   . Worried About Charity fundraiser in the Last Year: Not on file  . Ran Out of Food in the Last Year: Not on file  Transportation Needs:   . Lack of Transportation (Medical): Not on file  . Lack of Transportation (Non-Medical): Not on file  Physical Activity:   . Days of Exercise per Week: Not on file  . Minutes of Exercise per Session: Not on file  Stress:   . Feeling of Stress :  Not on file  Social Connections:   . Frequency of Communication with Friends and Family: Not on file  . Frequency of Social Gatherings with Friends and Family: Not on file  . Attends Religious Services: Not on file  . Active Member of Clubs or Organizations: Not on file  . Attends Archivist Meetings: Not on file  . Marital Status: Not on file  Intimate Partner Violence:   . Fear of Current or Ex-Partner: Not on file  . Emotionally Abused: Not on file  . Physically Abused: Not on file  . Sexually Abused: Not on file    Review of Systems: See HPI, otherwise negative ROS  Physical Exam: BP 122/71   Temp 98.3 F (36.8 C) (Oral)   Resp 20   LMP 03/28/2019 (Approximate)   SpO2 98%  General:   Alert,  Well-developed, well-nourished, pleasant and cooperative in NAD  Mouth:  No deformity or lesions. Neck:  Supple; no masses or thyromegaly. No significant cervical adenopathy. Lungs:  Clear throughout to auscultation.   No wheezes, crackles, or rhonchi. No acute distress. Heart:  Regular rate and rhythm; no murmurs, clicks, rubs,  or gallops. Abdomen: Non-distended, normal bowel sounds.  Soft and nontender without appreciable mass or hepatosplenomegaly.  Pulses:  Normal pulses noted. Extremities:  Without clubbing or edema.  Impression/Plan: 28 year old lady with chronic GERD.  Esophageal dysphagia. Recent improvement with change in PPI to [place]. EGD today with possible esophageal dilation per plan.  The risks, benefits, limitations, alternatives and imponderables have been reviewed with the patient. Potential for esophageal dilation, biopsy, etc. have also been reviewed.  Questions have been answered. All parties agreeable.     Notice: This dictation was prepared with Dragon dictation along with smaller phrase technology. Any transcriptional errors that result from this process are unintentional and may not be corrected upon review.

## 2019-04-08 NOTE — Discharge Instructions (Signed)
EGD Discharge instructions Please read the instructions outlined below and refer to this sheet in the next few weeks. These discharge instructions provide you with general information on caring for yourself after you leave the hospital. Your doctor may also give you specific instructions. While your treatment has been planned according to the most current medical practices available, unavoidable complications occasionally occur. If you have any problems or questions after discharge, please call your doctor. ACTIVITY  You may resume your regular activity but move at a slower pace for the next 24 hours.   Take frequent rest periods for the next 24 hours.   Walking will help expel (get rid of) the air and reduce the bloated feeling in your abdomen.   No driving for 24 hours (because of the anesthesia (medicine) used during the test).   You may shower.   Do not sign any important legal documents or operate any machinery for 24 hours (because of the anesthesia used during the test).  NUTRITION  Drink plenty of fluids.   You may resume your normal diet.   Begin with a light meal and progress to your normal diet.   Avoid alcoholic beverages for 24 hours or as instructed by your caregiver.  MEDICATIONS  You may resume your normal medications unless your caregiver tells you otherwise.  WHAT YOU CAN EXPECT TODAY  You may experience abdominal discomfort such as a feeling of fullness or "gas" pains.  FOLLOW-UP  Your doctor will discuss the results of your test with you.  SEEK IMMEDIATE MEDICAL ATTENTION IF ANY OF THE FOLLOWING OCCUR:  Excessive nausea (feeling sick to your stomach) and/or vomiting.   Severe abdominal pain and distention (swelling).   Trouble swallowing.   Temperature over 101 F (37.8 C).   Rectal bleeding or vomiting of blood.   GERD information provided  Continue Dexilant 60 mg daily  At patient request, I called Darden Amber and reviewed report.   [phone]

## 2019-04-08 NOTE — Anesthesia Postprocedure Evaluation (Signed)
Anesthesia Post Note  Patient: [place] N Degrave  Procedure(s) Performed: ESOPHAGOGASTRODUODENOSCOPY (EGD) WITH PROPOFOL (N/A ) MALONEY DILATION (N/A )  Patient location during evaluation: Endoscopy Anesthesia Type: General Level of consciousness: awake and alert Pain management: pain level controlled Vital Signs Assessment: post-procedure vital signs reviewed and stable Respiratory status: spontaneous breathing, nonlabored ventilation, respiratory function stable and patient connected to nasal cannula oxygen Cardiovascular status: blood pressure returned to baseline and stable Postop Assessment: no apparent nausea or vomiting Anesthetic complications: no     Last Vitals:  Vitals:   04/08/19 1130 04/08/19 1137  BP: (!) 117/59 (!) 120/93  Pulse: 71 70  Resp: (!) 22 16  Temp:  36.8 C  SpO2: 98% 100%    Last Pain:  Vitals:   04/08/19 1137  TempSrc: Oral  PainSc: 0-No pain                 Adair Patter Sangita Zani

## 2019-04-08 NOTE — Op Note (Signed)
[facility] Patient Name: Dana Mcpherson Procedure Date: 04/08/2019 10:45 AM MRN: [id number] Date of Birth: Jul 08, 1990 Attending MD: Norvel Richards , MD CSN: [id number] Age: 28 Admit Type: Outpatient Procedure:                Upper GI endoscopy Indications:              Dysphagia Providers:                Norvel Richards, MD, Hinton Rao, RN, Ralene Bathe, Technician, Randa Spike, Technician Referring MD:              Medicines:                Propofol per Anesthesia Complications:            No immediate complications. Estimated Blood Loss:     Estimated blood loss: none. Procedure:                Pre-Anesthesia Assessment:                           - Prior to the procedure, a History and Physical                            was performed, and patient medications and                            allergies were reviewed. The patient's tolerance of                            previous anesthesia was also reviewed. The risks                            and benefits of the procedure and the sedation                            options and risks were discussed with the patient.                            All questions were answered, and informed consent                            was obtained. Prior Anticoagulants: The patient has                            taken no previous anticoagulant or antiplatelet                            agents. ASA Grade Assessment: II - A patient with                            mild systemic disease. After reviewing the risks  and benefits, the patient was deemed in                            satisfactory condition to undergo the procedure.                           After obtaining informed consent, the endoscope was                            passed under direct vision. Throughout the                            procedure, the patient's blood pressure, pulse, and   oxygen saturations were monitored continuously. The                            [id number]-[id number]) scope was introduced through the                            mouth, and advanced to the second part of duodenum.                            The upper GI endoscopy was accomplished without                            difficulty. The patient tolerated the procedure                            well. Scope In: 11:08:55 AM Scope Out: 11:13:21 AM Total Procedure Duration: 0 hours 4 minutes 26 seconds  Findings:      Distal esophageal erosions within 5 mm of the GE junction. No Barrett's       epithelium seen. Tubular esophagus appeared patent throughout its course.      Stomach abnormal congested diffusely with snakeskin orifice scale       appearance. Antral erosions. No ulcer or infiltrating process observed.       Patent pylorus. Congested eroded duodenal bulb as well can portion of       the duodenum appeared normal.      The duodenal bulb and second portion of the duodenum were normal. The       scope was withdrawn. Dilation was performed with a Maloney dilator with       mild resistance at 64 Fr. The dilation site was examined following       endoscope reinsertion and showed no change. Estimated blood loss: none. Impression:               -Erosive reflux esophagitis.                           - Normal duodenal bulb and second portion of the                            duodenum.                           - No specimens collected. Moderate Sedation:  Moderate (conscious) sedation was personally administered by an       anesthesia professional. The following parameters were monitored: oxygen       saturation, heart rate, blood pressure, respiratory rate, EKG, adequacy       of pulmonary ventilation, and response to care. Recommendation:           - Patient has a contact number available for                            emergencies. The signs and symptoms of potential                             delayed complications were discussed with the                            patient. Return to normal activities tomorrow.                            Written discharge instructions were provided to the                            patient.                           - Resume previous diet.                           - Continue present medications. Continue Dexilant                            60 mg daily                           - Return to GI office in 3 months. Procedure Code(s):        --- Professional ---                           [phone], Esophagogastroduodenoscopy, flexible,                            transoral; diagnostic, including collection of                            specimen(s) by brushing or washing, when performed                            (separate procedure)                           43450, Dilation of esophagus, by unguided sound or                            bougie, single or multiple passes Diagnosis Code(s):        --- Professional ---                           R13.10, Dysphagia,  unspecified CPT copyright 2019 American Medical Association. All rights reserved. The codes documented in this report are preliminary and upon coder review may  be revised to meet current compliance requirements. Cristopher Estimable. Aliayah Tyer, MD Norvel Richards, MD 04/08/2019 12:07:25 PM This report has been signed electronically. Number of Addenda: 0

## 2019-04-09 ENCOUNTER — Telehealth: Payer: Self-pay

## 2019-04-09 NOTE — Telephone Encounter (Signed)
Noted. Spoke with pt. Pt is aware that she should proceed to the ED if her symptoms worsen or check with her cardiologist.

## 2019-04-09 NOTE — Telephone Encounter (Signed)
EGD was yesterday 04/08/2019. When pt got home after procedure, she slept for 2 hours. After the 2 hours was up, pt states she hasn't slept. Pt says she feels like she is on a caffeine high. Pt's  heart is racing, itching on chest & scalp, mild redness where the tape was applied.  Pt feels that she can't settle down or sleep and wants to know if this is normal? Pt was asked does she every get anxiety, pt says that she does but it feels like she's had 20 cups of coffee. Pt does have tachycardia and took her medication Corlanor 5 mg at 8 pm last night and 8 am this morning. Pt takes 1 1/2 tab bid daily and it's suppose to slow her heart beat down. Pt's heart ratet is at 85 bpm after taking the medication. Pt has previously had reactions to anesthesia and isn't sure if she's having a reaction to it or if she is having a problem with her heart. Pt is aware that if her symptoms worsen, she needs to go to the ED.

## 2019-04-09 NOTE — Telephone Encounter (Signed)
Highly unlikely this has anything to do with yesterday's procedure/anesthesia.  Agree with advice given.

## 2019-05-13 ENCOUNTER — Emergency Department (HOSPITAL_COMMUNITY): Payer: Medicaid Other

## 2019-05-13 ENCOUNTER — Other Ambulatory Visit: Payer: Self-pay

## 2019-05-13 ENCOUNTER — Emergency Department (HOSPITAL_COMMUNITY)
Admission: EM | Admit: 2019-05-13 | Discharge: 2019-05-13 | Disposition: A | Discharge status: Home / Self Care | Payer: Medicaid Other | Attending: Emergency Medicine | Admitting: Emergency Medicine

## 2019-05-13 ENCOUNTER — Encounter (HOSPITAL_COMMUNITY): Payer: Self-pay | Admitting: Emergency Medicine

## 2019-05-13 DIAGNOSIS — Z87891 Personal history of nicotine dependence: Secondary | ICD-10-CM | POA: Insufficient documentation

## 2019-05-13 DIAGNOSIS — N390 Urinary tract infection, site not specified: Secondary | ICD-10-CM | POA: Insufficient documentation

## 2019-05-13 DIAGNOSIS — Z91040 Latex allergy status: Secondary | ICD-10-CM | POA: Insufficient documentation

## 2019-05-13 DIAGNOSIS — Z793 Long term (current) use of hormonal contraceptives: Secondary | ICD-10-CM | POA: Insufficient documentation

## 2019-05-13 DIAGNOSIS — Z79899 Other long term (current) drug therapy: Secondary | ICD-10-CM | POA: Insufficient documentation

## 2019-05-13 DIAGNOSIS — J45909 Unspecified asthma, uncomplicated: Secondary | ICD-10-CM | POA: Insufficient documentation

## 2019-05-13 DIAGNOSIS — R102 Pelvic and perineal pain: Secondary | ICD-10-CM | POA: Diagnosis present

## 2019-05-13 LAB — URINALYSIS, ROUTINE W REFLEX MICROSCOPIC
Bilirubin Urine: NEGATIVE
Glucose, UA: NEGATIVE mg/dL
Ketones, ur: NEGATIVE mg/dL
Nitrite: NEGATIVE
Protein, ur: NEGATIVE mg/dL
Specific Gravity, Urine: 1.013 (ref 1.005–1.030)
pH: 6 (ref 5.0–8.0)

## 2019-05-13 LAB — WET PREP, GENITAL
Clue Cells Wet Prep HPF POC: NONE SEEN
Sperm: NONE SEEN
Trich, Wet Prep: NONE SEEN
Yeast Wet Prep HPF POC: NONE SEEN

## 2019-05-13 LAB — CBC
HCT: 43.1 % (ref 36.0–46.0)
Hemoglobin: 14.1 g/dL (ref 12.0–15.0)
MCH: 28 pg (ref 26.0–34.0)
MCHC: 32.7 g/dL (ref 30.0–36.0)
MCV: 85.7 fL (ref 80.0–100.0)
Platelets: 399 10*3/uL (ref 150–400)
RBC: 5.03 MIL/uL (ref 3.87–5.11)
RDW: 14 % (ref 11.5–15.5)
WBC: 10.4 10*3/uL (ref 4.0–10.5)
nRBC: 0 % (ref 0.0–0.2)

## 2019-05-13 LAB — PREGNANCY, URINE: Preg Test, Ur: NEGATIVE

## 2019-05-13 LAB — COMPREHENSIVE METABOLIC PANEL
ALT: 62 U/L — ABNORMAL HIGH (ref 0–44)
AST: 39 U/L (ref 15–41)
Albumin: 3.9 g/dL (ref 3.5–5.0)
Alkaline Phosphatase: 79 U/L (ref 38–126)
Anion gap: 10 (ref 5–15)
BUN: 8 mg/dL (ref 6–20)
CO2: 20 mmol/L — ABNORMAL LOW (ref 22–32)
Calcium: 9.3 mg/dL (ref 8.9–10.3)
Chloride: 108 mmol/L (ref 98–111)
Creatinine, Ser: 0.94 mg/dL (ref 0.44–1.00)
GFR calc Af Amer: >60 mL/min (ref >60)
GFR calc non Af Amer: >60 mL/min (ref >60)
Glucose, Bld: 120 mg/dL — ABNORMAL HIGH (ref 70–99)
Potassium: 3.6 mmol/L (ref 3.5–5.1)
Sodium: 138 mmol/L (ref 135–145)
Total Bilirubin: 0.4 mg/dL (ref 0.3–1.2)
Total Protein: 8 g/dL (ref 6.5–8.1)

## 2019-05-13 LAB — HIV ANTIBODY (ROUTINE TESTING W REFLEX): HIV Screen 4th Generation wRfx: NONREACTIVE

## 2019-05-13 IMAGING — US US PELVIS COMPLETE TRANSABD/TRANSVAG W DUPLEX
1 series · 13 of 25 positions shown · non-contrast
Comparison: None.

CLINICAL DATA: 28-year-old female with left pelvic pain x2 days.

EXAM:
TRANSABDOMINAL AND TRANSVAGINAL ULTRASOUND OF PELVIS
DOPPLER ULTRASOUND OF OVARIES
TECHNIQUE: Both transabdominal and transvaginal ultrasound examinations of the
pelvis were performed. Transabdominal technique was performed for
global imaging of the pelvis including uterus, ovaries, adnexal
regions, and pelvic cul-de-sac.
It was necessary to proceed with endovaginal exam following the
transabdominal exam to visualize the endometrium and ovaries. Color
and duplex Doppler ultrasound was utilized to evaluate blood flow to
the ovaries.

[Series 1: us pelvis complete transabd/transvag w duplex · 0.23mm/px · 13 of 201 slices shown]
[im 1/201]
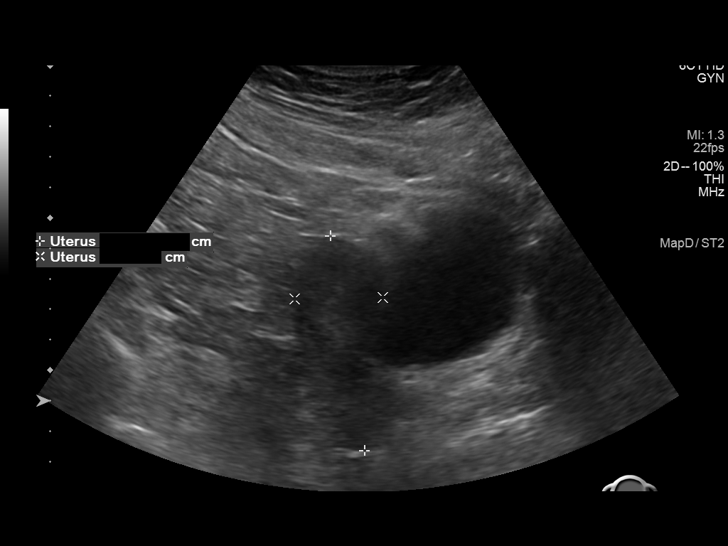
[im 17/201]
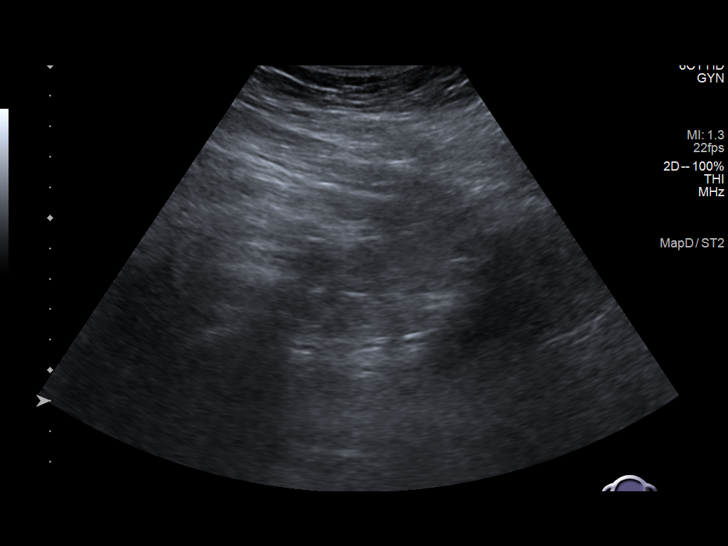
[im 34/201]
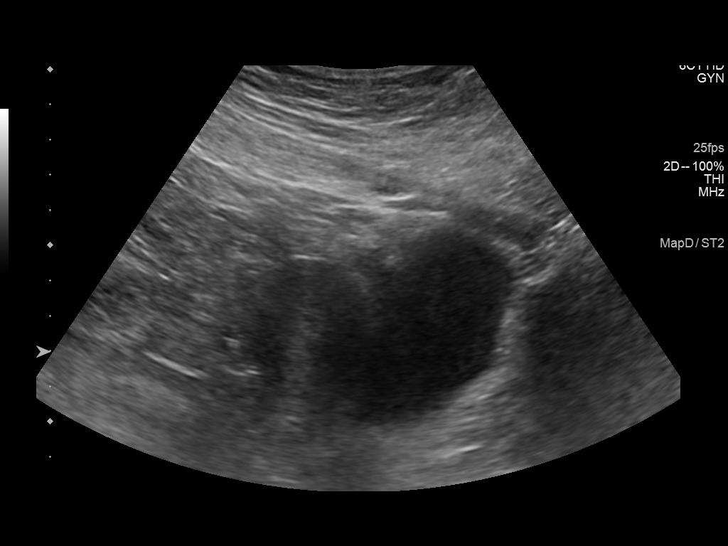
[im 51/201]
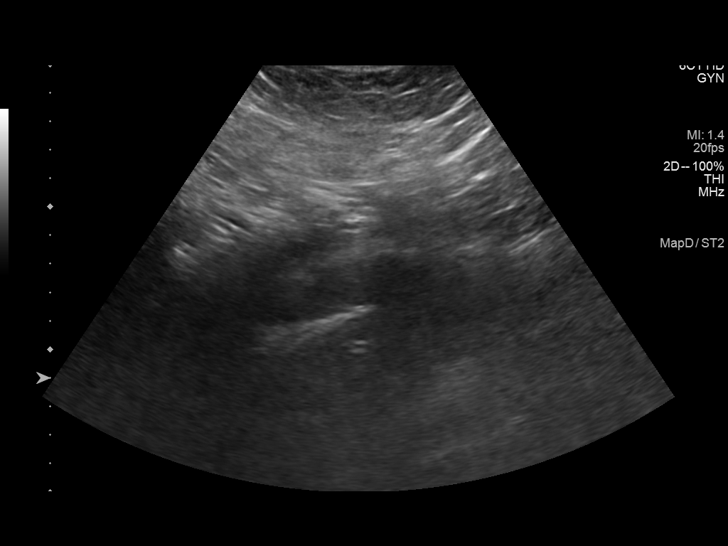
[im 67/201]
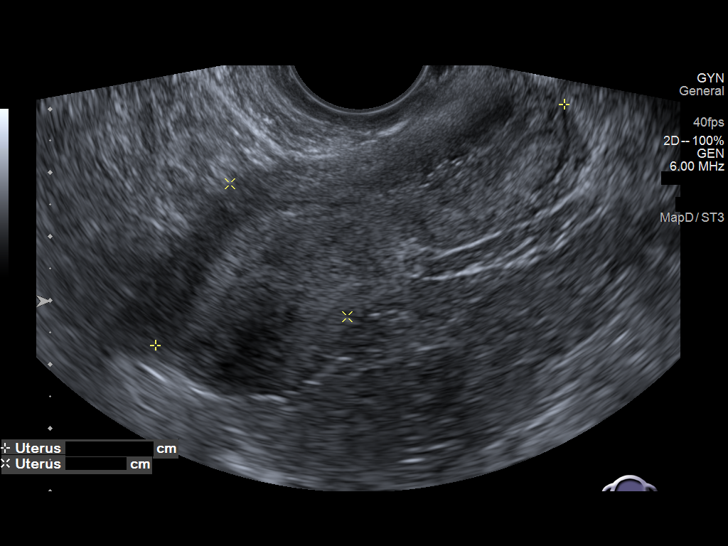
[im 84/201]
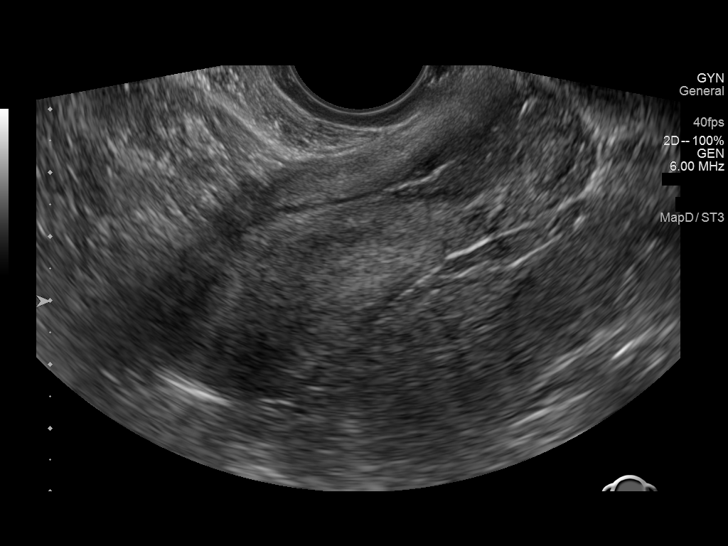
[im 101/201]
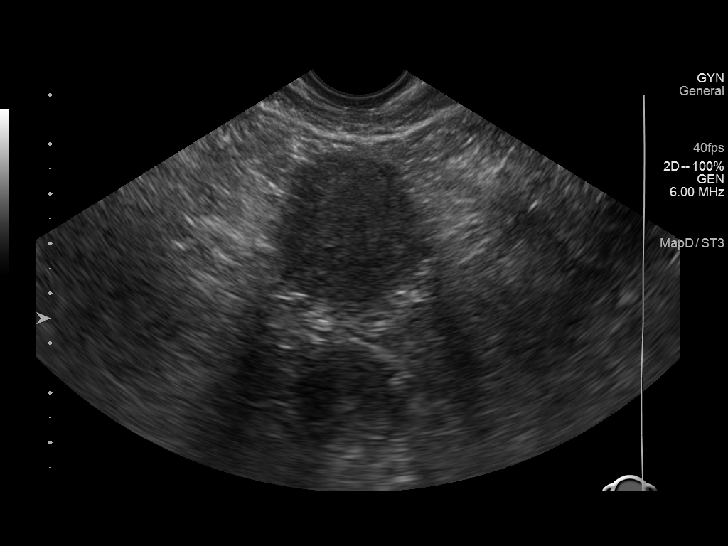
[im 117/201]
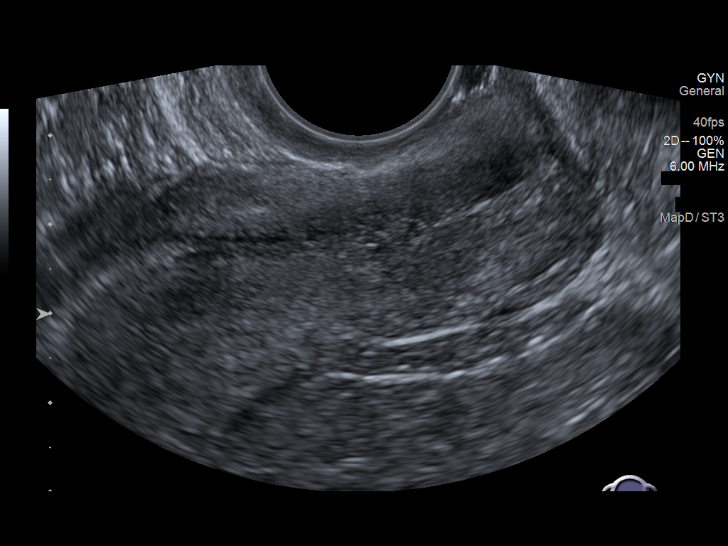
[im 134/201]
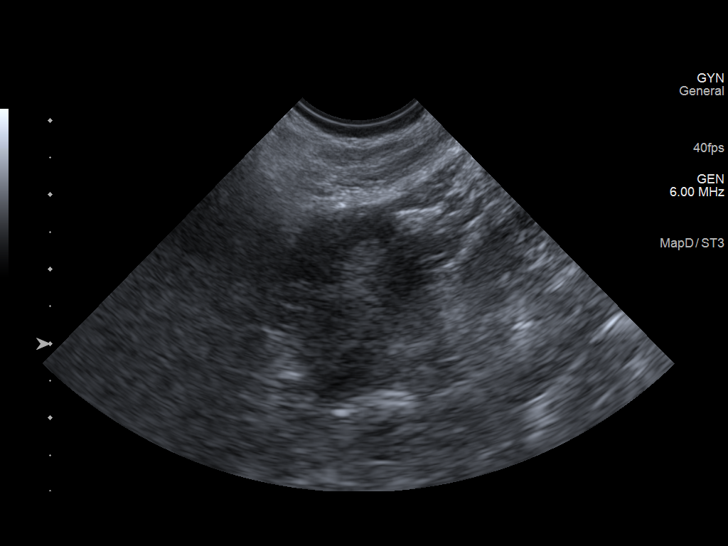
[im 151/201]
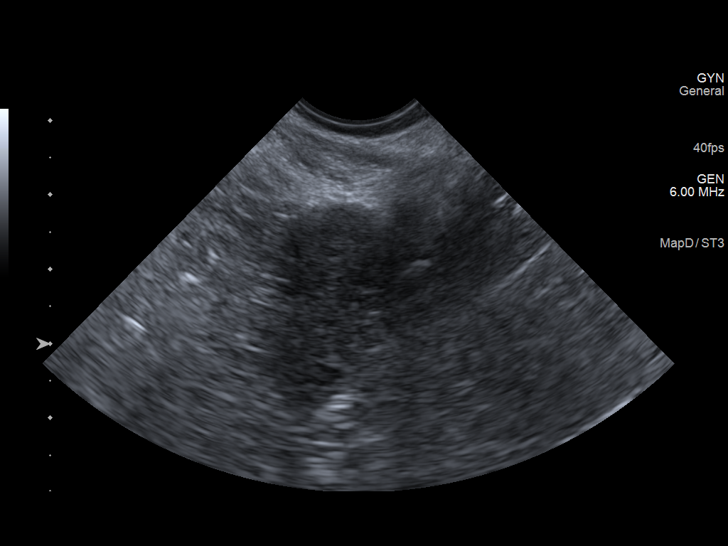
[im 167/201]
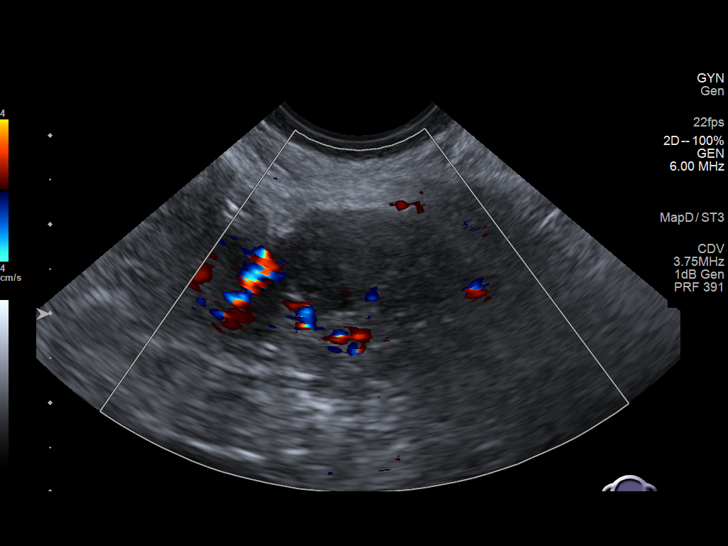
[im 184/201]
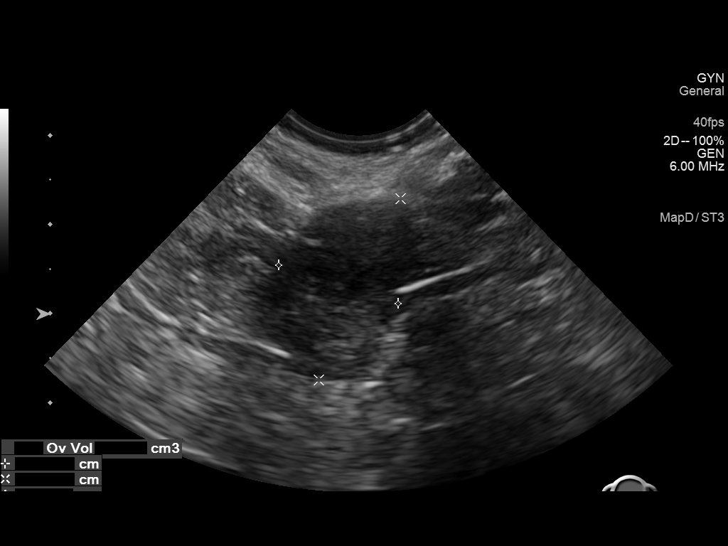
[im 201/201]
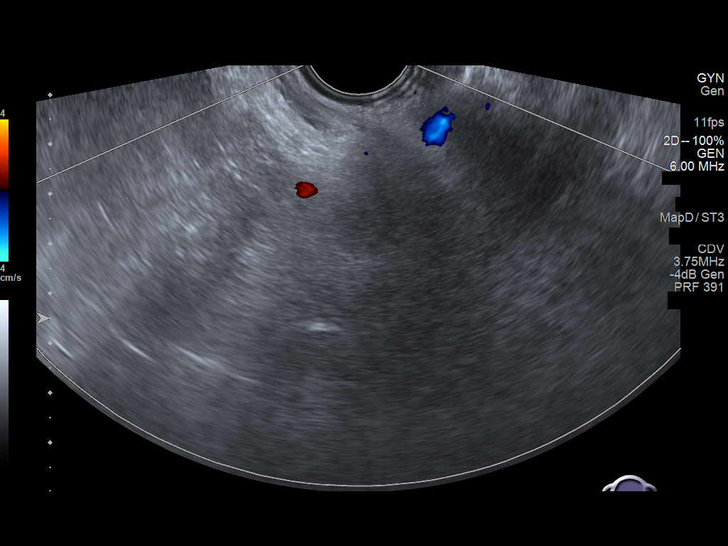

[13 of 25 positions shown; findings below may reference images not displayed]

FINDINGS: Uterus

Measurements: 7.4 x 2.8 x 3.9 cm = volume: 42 mL. The uterus is
anteverted and slightly heterogeneous. No focal mass.

Endometrium

Thickness: 5 mm.  No focal abnormality visualized.

Right ovary

Measurements: 2.9 x 2.0 x 1.9 cm = volume: 6 mL. Normal
appearance/no adnexal mass.

Left ovary

Measurements: 2.2 x 1.4 x 2.1 cm = volume: 4 mL. Normal
appearance/no adnexal mass.

Pulsed Doppler evaluation of both ovaries demonstrates normal
low-resistance arterial and venous waveforms.

Other findings

No abnormal free fluid.
IMPRESSION: Slightly heterogeneous uterus, otherwise unremarkable pelvic
ultrasound.

## 2019-05-13 MED ORDER — CEFTRIAXONE SODIUM 500 MG IJ SOLR
500.0000 mg | Freq: Once | INTRAMUSCULAR | Status: AC
  Administered 2019-05-13: 20:00:00 500 mg via INTRAMUSCULAR
  Filled 2019-05-13: qty 500

## 2019-05-13 MED ORDER — IBUPROFEN 800 MG PO TABS
800.0000 mg | ORAL_TABLET | Freq: Once | ORAL | Status: AC
  Administered 2019-05-13: 800 mg via ORAL
  Filled 2019-05-13: qty 1

## 2019-05-13 MED ORDER — ONDANSETRON HCL 4 MG/2ML IJ SOLN
4.0000 mg | Freq: Once | INTRAMUSCULAR | Status: AC
  Administered 2019-05-13: 4 mg via INTRAVENOUS
  Filled 2019-05-13: qty 2

## 2019-05-13 MED ORDER — DOXYCYCLINE HYCLATE 100 MG PO CAPS: 100.0000 mg | ORAL_CAPSULE | Freq: Two times a day (BID) | ORAL | 0 refills | Status: DC

## 2019-05-13 MED ORDER — MORPHINE SULFATE (PF) 4 MG/ML IV SOLN
4.0000 mg | Freq: Once | INTRAVENOUS | Status: DC
  Filled 2019-05-13: qty 1

## 2019-05-13 MED ORDER — IBUPROFEN 600 MG PO TABS: 600.0000 mg | ORAL_TABLET | Freq: Four times a day (QID) | ORAL | 0 refills | Status: DC | PRN

## 2019-05-13 MED ORDER — STERILE WATER FOR INJECTION IJ SOLN
INTRAMUSCULAR | Status: AC
  Administered 2019-05-13: 10 mL
  Filled 2019-05-13: qty 10

## 2019-05-13 NOTE — ED Triage Notes (Signed)
Pt reports abd pain, left flank pain x2 days. Pt reports history of chronic UTI's. Pt denies any known fevers, n/v/d.

## 2019-05-13 NOTE — ED Provider Notes (Signed)
[facility] Provider Note   CSN: [id number] Arrival date & time: 05/13/19  1534     History Chief Complaint  Patient presents with  . Abdominal Pain    Dana Mcpherson is a 29 y.o. female.  The history is provided by the patient. No language interpreter was used.  Abdominal Pain    29 year old female with history of polycystic ovarian syndrome, recurrent UTI, GERD, obesity presenting for evaluation of abdominal pain.  Patient report for the past 3 days she has had persistent pain to her lower back and her left lower abdomen.  She described pain as a crampy achy sensation, moderate in severity, with associate nausea vomiting diarrhea, occasional subjective chills feeling uncomfortable.  Pain is persistent worse when she lays on the affected side.  She does not complain of any urinary discomfort.  She report recurrent vaginal discharge and have not noticed any new vaginal discharge.  Denies any new sexual partner.  States she has recurrent UTI and therefore she normally does not know when she has UTI.  She denies any recent sick contact with anyone with COVID-19.  She has tried over-the-counter medication without adequate relief.  She was seen by her PCP today for symptoms but was recommended to come to ER for further evaluation.  She denies any prior history of kidney stone.  She endorsed nausea when pain is intense.  Past Medical History:  Diagnosis Date  . Allergy   . Anxiety   . Asthma   . Complication of anesthesia    woke up during surgery & ithing after surgery  . Family history of adverse reaction to anesthesia    "My mom has the same trouble with anesthesia".  . GERD (gastroesophageal reflux disease)   . Migraine headache   . Nexplanon in place 10/05/2015   06/29/15 left arm  . Polycystic ovarian syndrome   . Pregnancy induced hypertension   . Tachycardia   . Vaginal Pap smear, abnormal     Patient Active Problem List   Diagnosis Date Noted  .  Abdominal bloating 02/20/2019  . Elevated LFTs 02/20/2019  . Gastroesophageal reflux disease 02/20/2019  . Dysphagia 02/20/2019  . Diarrhea 02/20/2019  . Atypical pneumonia 02/19/2016  . Pulmonary infiltrates 02/18/2016  . Hemorrhoid 06/29/2015  . Shortness of breath 11/04/2014  . Tachycardia 10/07/2014  . ASCUS with positive high risk HPV 05/28/2014  . PCOS (polycystic ovarian syndrome) 03/03/2014  . Hyperinsulinemia 03/03/2014  . Morbid obesity ([place]) 05/23/2013  . DUB (dysfunctional uterine bleeding) 05/23/2013    Past Surgical History:  Procedure Laterality Date  . CHOLECYSTECTOMY  2010  . ESOPHAGOGASTRODUODENOSCOPY    . ESOPHAGOGASTRODUODENOSCOPY (EGD) WITH PROPOFOL N/A 04/08/2019   Procedure: ESOPHAGOGASTRODUODENOSCOPY (EGD) WITH PROPOFOL;  Surgeon: Daneil Dolin, MD;  Location: [facility];  Service: Endoscopy;  Laterality: N/A;  10:45am  . MALONEY DILATION N/A 04/08/2019   Procedure: Venia Minks DILATION;  Surgeon: Daneil Dolin, MD;  Location: [facility];  Service: Endoscopy;  Laterality: N/A;     OB History    Gravida  3   Para  2   Term  2   Preterm      AB  1   Living  2     SAB  1   TAB      Ectopic      Multiple  0   Live Births  2        Obstetric Comments  IOL for pre-e  Family History  Problem Relation Age of Onset  . Cancer Paternal Grandmother        ovarian cancer  . Asthma Son   . ADD / ADHD Son   . Colon cancer Other        late 10's    Social History   Tobacco Use  . Smoking status: Former Smoker    Packs/day: 0.25    Years: 2.00    Pack years: 0.50    Types: Cigarettes    Quit date: 04/04/2010    Years since quitting: 9.1  . Smokeless tobacco: Never Used  Substance Use Topics  . Alcohol use: No  . Drug use: No    Home Medications Prior to Admission medications   Medication Sig Start Date End Date Taking? Authorizing Provider  CORLANOR 5 MG TABS tablet TAKE 1 & 1/2 TABLETS BY MOUTH 2 TIMES A DAY  WITH A MEAL. Patient taking differently: Take 7.5 mg by mouth 2 (two) times daily.  01/29/19   Herminio Commons, MD  dexlansoprazole (DEXILANT) 60 MG capsule Take 1 capsule (60 mg total) by mouth daily. 02/20/19   Erenest Rasher, PA-C  dicyclomine (BENTYL) 10 MG capsule Take 1 capsule (10 mg total) by mouth 4 (four) times daily -  before meals and at bedtime. Patient taking differently: Take 10 mg by mouth 4 (four) times daily as needed (cramps).  02/20/19   Erenest Rasher, PA-C  diphenhydrAMINE (BENADRYL) 25 MG tablet Take 12.5-25 mg by mouth at bedtime as needed for itching.    [provider]  Flaxseed, Linseed, (FLAXSEED OIL) 1200 MG CAPS Take 1,200 mg by mouth 2 (two) times daily.    [provider]  ibuprofen (ADVIL) 200 MG tablet Take 400-600 mg by mouth every 4 (four) hours as needed for headache.    [provider]  INTROVALE 0.15-0.03 MG tablet TAKE 1T BY MOUTH EVERY DAY Patient taking differently: Take 1 tablet by mouth at bedtime.  03/21/18   Cresenzo-Dishmon, Joaquim Lai, CNM  topiramate (TOPAMAX) 25 MG capsule Take 50 mg by mouth 2 (two) times daily.    [provider]    Allergies    Other, Shellfish allergy, Adhesive [tape], Eggs or egg-derived products, and Latex  Review of Systems   Review of Systems  Gastrointestinal: Positive for abdominal pain.  All other systems reviewed and are negative.   Physical Exam Updated Vital Signs BP 139/84 (BP Location: Right Arm)   Pulse 76   Temp 97.6 F (36.4 C) (Oral)   Resp 18   Ht 5' 6.5" (1.689 m)   Wt 108 kg   LMP 03/13/2019   SpO2 100%   BMI 37.84 kg/m   Physical Exam Vitals and nursing note reviewed.  Constitutional:      General: She is not in acute distress.    Appearance: She is well-developed. She is obese.  HENT:     Head: Atraumatic.  Eyes:     Conjunctiva/sclera: Conjunctivae normal.  Cardiovascular:     Rate and Rhythm: Normal rate and regular rhythm.  Abdominal:      General: Abdomen is flat.     Palpations: Abdomen is soft.     Tenderness: There is abdominal tenderness in the left lower quadrant. There is right CVA tenderness and left CVA tenderness.  Genitourinary:    Comments: Chaperone present during exam.  No inguinal lymphadenopathy or inguinal hernia noted.  Normal external genitalia.  Small amount of vaginal discharge noted in vaginal  vault.  Cervical os is closed.  There is some friable skin changes noted to the T-zone.  On bimanual examination left adnexal tenderness without cervical motion tenderness. Musculoskeletal:     Cervical back: Neck supple.  Skin:    Findings: No rash.  Neurological:     Mental Status: She is alert.     ED Results / Procedures / Treatments   Labs (all labs ordered are listed, but only abnormal results are displayed) Labs Reviewed  WET PREP, GENITAL - Abnormal; Notable for the following components:      Result Value   WBC, Wet Prep HPF POC FEW (*)    All other components within normal limits  COMPREHENSIVE METABOLIC PANEL - Abnormal; Notable for the following components:   CO2 20 (*)    Glucose, Bld 120 (*)    ALT 62 (*)    All other components within normal limits  URINALYSIS, ROUTINE W REFLEX MICROSCOPIC - Abnormal; Notable for the following components:   APPearance HAZY (*)    Hgb urine dipstick SMALL (*)    Leukocytes,Ua SMALL (*)    Bacteria, UA MANY (*)    All other components within normal limits  URINE CULTURE  CBC  HIV ANTIBODY (ROUTINE TESTING W REFLEX)  PREGNANCY, URINE  RPR  GC/CHLAMYDIA PROBE AMP ([place]) NOT AT [facility]    EKG None  Radiology US PELVIC COMPLETE W TRANSVAGINAL AND TORSION R/O  Result Date: 05/13/2019 CLINICAL DATA:  29 year old female with left pelvic pain x2 days. EXAM: TRANSABDOMINAL AND TRANSVAGINAL ULTRASOUND OF PELVIS DOPPLER ULTRASOUND OF OVARIES TECHNIQUE: Both transabdominal and transvaginal ultrasound examinations of the pelvis were performed.  Transabdominal technique was performed for global imaging of the pelvis including uterus, ovaries, adnexal regions, and pelvic cul-de-sac. It was necessary to proceed with endovaginal exam following the transabdominal exam to visualize the endometrium and ovaries. Color and duplex Doppler ultrasound was utilized to evaluate blood flow to the ovaries. COMPARISON:  None. FINDINGS: Uterus Measurements: 7.4 x 2.8 x 3.9 cm = volume: 42 mL. The uterus is anteverted and slightly heterogeneous. No focal mass. Endometrium Thickness: 5 mm.  No focal abnormality visualized. Right ovary Measurements: 2.9 x 2.0 x 1.9 cm = volume: 6 mL. Normal appearance/no adnexal mass. Left ovary Measurements: 2.2 x 1.4 x 2.1 cm = volume: 4 mL. Normal appearance/no adnexal mass. Pulsed Doppler evaluation of both ovaries demonstrates normal low-resistance arterial and venous waveforms. Other findings No abnormal free fluid. IMPRESSION: Slightly heterogeneous uterus, otherwise unremarkable pelvic ultrasound. Electronically Signed   By: Anner Crete M.D.   On: 05/13/2019 22:28    Procedures Procedures (including critical care time)  Medications Ordered in ED Medications  morphine 4 MG/ML injection 4 mg (4 mg Intravenous Refused 05/13/19 1836)  ondansetron (ZOFRAN) injection 4 mg (4 mg Intravenous Given 05/13/19 1834)  cefTRIAXone (ROCEPHIN) injection 500 mg (500 mg Intramuscular Given 05/13/19 1934)  ibuprofen (ADVIL) tablet 800 mg (800 mg Oral Given 05/13/19 1934)  sterile water (preservative free) injection (10 mLs  Given 05/13/19 1934)    ED Course  I have reviewed the triage vital signs and the nursing notes.  Pertinent labs & imaging results that were available during my care of the patient were reviewed by me and considered in my medical decision making (see chart for details).    MDM Rules/Calculators/A&P                      BP 130/83   Pulse 80  Temp 97.6 F (36.4 C) (Oral)   Resp 18   Ht 5' 6.5" (1.689 m)    Wt 108 kg   LMP 03/13/2019   SpO2 100%   BMI 37.84 kg/m   Final Clinical Impression(s) / ED Diagnoses Final diagnoses:  Lower urinary tract infectious disease  Pelvic pain in female    Rx / DC Orders ED Discharge Orders         Ordered    doxycycline (VIBRAMYCIN) 100 MG capsule  2 times daily     05/13/19 2254    ibuprofen (ADVIL) 600 MG tablet  Every 6 hours PRN     05/13/19 2254         6:21 PM Patient here with left lower quadrant abdominal pain and lower back pain for the past 3 days.  History of polycystic ovarian syndrome.  Would benefit from pelvic examination.  Doubt appy or diverticulitis.  Low suspicion for kidney stone.   10:47 PM Negative pregnancy test.  On wet prep there is no significant signs of infection.  UA however concerning for potential UTI with many bacteria and small amount of hemoglobin and urine dipsticks.  Labs are reassuring a transvaginal and pelvic ultrasound performed to rule out ovarian torsion.  Fortunately the result is unremarkable.  I also did discussed option of CT scan to assess for potential kidney stone versus diverticulitis however suspicion is low at this time.  Patient opted not for CT scan.  She will go home with antibiotics for potential UTI however she understands to return promptly if her condition worsen.  Patient otherwise stable for discharge.   Domenic Moras, PA-C 05/13/19 2254    Noemi Chapel, MD 05/14/19 [phone]

## 2019-05-13 NOTE — ED Notes (Signed)
[place] called in for scans

## 2019-05-13 NOTE — Discharge Instructions (Signed)
Please take ibuprofen as needed for pain.  Take antibiotic as treatment for UTI.  Return to the ER probably if your condition worsen or you have any other concern.

## 2019-05-14 LAB — RPR: RPR Ser Ql: NONREACTIVE

## 2019-05-15 LAB — URINE CULTURE

## 2019-05-15 LAB — GC/CHLAMYDIA PROBE AMP (~~LOC~~) NOT AT ARMC
Chlamydia: NEGATIVE
Neisseria Gonorrhea: NEGATIVE

## 2019-05-19 ENCOUNTER — Emergency Department (HOSPITAL_COMMUNITY)
Admission: EM | Admit: 2019-05-19 | Discharge: 2019-05-20 | Disposition: A | Discharge status: Home / Self Care | Payer: Medicaid Other | Attending: Emergency Medicine | Admitting: Emergency Medicine

## 2019-05-19 ENCOUNTER — Emergency Department (HOSPITAL_COMMUNITY): Payer: Medicaid Other

## 2019-05-19 ENCOUNTER — Other Ambulatory Visit: Payer: Self-pay

## 2019-05-19 ENCOUNTER — Encounter (HOSPITAL_COMMUNITY): Payer: Self-pay | Admitting: *Deleted

## 2019-05-19 DIAGNOSIS — R197 Diarrhea, unspecified: Secondary | ICD-10-CM | POA: Diagnosis not present

## 2019-05-19 DIAGNOSIS — K625 Hemorrhage of anus and rectum: Secondary | ICD-10-CM | POA: Diagnosis not present

## 2019-05-19 DIAGNOSIS — Z79899 Other long term (current) drug therapy: Secondary | ICD-10-CM | POA: Insufficient documentation

## 2019-05-19 DIAGNOSIS — R112 Nausea with vomiting, unspecified: Secondary | ICD-10-CM

## 2019-05-19 DIAGNOSIS — Z20822 Contact with and (suspected) exposure to covid-19: Secondary | ICD-10-CM | POA: Insufficient documentation

## 2019-05-19 DIAGNOSIS — Z87891 Personal history of nicotine dependence: Secondary | ICD-10-CM | POA: Insufficient documentation

## 2019-05-19 DIAGNOSIS — Z91040 Latex allergy status: Secondary | ICD-10-CM | POA: Insufficient documentation

## 2019-05-19 DIAGNOSIS — J45909 Unspecified asthma, uncomplicated: Secondary | ICD-10-CM | POA: Diagnosis not present

## 2019-05-19 DIAGNOSIS — R109 Unspecified abdominal pain: Secondary | ICD-10-CM | POA: Diagnosis present

## 2019-05-19 LAB — URINALYSIS, ROUTINE W REFLEX MICROSCOPIC
Bilirubin Urine: NEGATIVE
Glucose, UA: NEGATIVE mg/dL
Ketones, ur: NEGATIVE mg/dL
Nitrite: NEGATIVE
Protein, ur: NEGATIVE mg/dL
Specific Gravity, Urine: 1.003 — ABNORMAL LOW (ref 1.005–1.030)
pH: 6 (ref 5.0–8.0)

## 2019-05-19 LAB — COMPREHENSIVE METABOLIC PANEL
ALT: 64 U/L — ABNORMAL HIGH (ref 0–44)
AST: 37 U/L (ref 15–41)
Albumin: 4.3 g/dL (ref 3.5–5.0)
Alkaline Phosphatase: 82 U/L (ref 38–126)
Anion gap: 13 (ref 5–15)
BUN: 9 mg/dL (ref 6–20)
CO2: 19 mmol/L — ABNORMAL LOW (ref 22–32)
Calcium: 9.3 mg/dL (ref 8.9–10.3)
Chloride: 105 mmol/L (ref 98–111)
Creatinine, Ser: 0.88 mg/dL (ref 0.44–1.00)
GFR calc Af Amer: >60 mL/min (ref >60)
GFR calc non Af Amer: >60 mL/min (ref >60)
Glucose, Bld: 128 mg/dL — ABNORMAL HIGH (ref 70–99)
Potassium: 3.6 mmol/L (ref 3.5–5.1)
Sodium: 137 mmol/L (ref 135–145)
Total Bilirubin: 0.5 mg/dL (ref 0.3–1.2)
Total Protein: 8.5 g/dL — ABNORMAL HIGH (ref 6.5–8.1)

## 2019-05-19 LAB — CBC
HCT: 47.5 % — ABNORMAL HIGH (ref 36.0–46.0)
Hemoglobin: 15.1 g/dL — ABNORMAL HIGH (ref 12.0–15.0)
MCH: 27.9 pg (ref 26.0–34.0)
MCHC: 31.8 g/dL (ref 30.0–36.0)
MCV: 87.6 fL (ref 80.0–100.0)
Platelets: 391 10*3/uL (ref 150–400)
RBC: 5.42 MIL/uL — ABNORMAL HIGH (ref 3.87–5.11)
RDW: 14 % (ref 11.5–15.5)
WBC: 16 10*3/uL — ABNORMAL HIGH (ref 4.0–10.5)
nRBC: 0 % (ref 0.0–0.2)

## 2019-05-19 LAB — HCG, QUANTITATIVE, PREGNANCY: hCG, Beta Chain, Quant, S: <1 m[IU]/mL (ref <5)

## 2019-05-19 LAB — LIPASE, BLOOD: Lipase: 29 U/L (ref 11–51)

## 2019-05-19 IMAGING — CT CT ABD-PELV W/ CM
2 of 4 series · 16 of 46 positions shown, 18 images · IV contrast (Omnipaque or Isovue)
Comparison: [DATE]

CLINICAL DATA: Nausea and vomiting. Abdominal pain.

EXAM:
CT ABDOMEN AND PELVIS WITH CONTRAST
TECHNIQUE: Multidetector CT imaging of the abdomen and pelvis was performed
using the standard protocol following bolus administration of
intravenous contrast.
CONTRAST:  100mL OMNIPAQUE IOHEXOL 300 MG/ML  SOLN

[Series 2: axial st · axial · 0.91mm/px · z∈[+891,+1326]mm · 13 of 101 slices shown, 15 images]
[im 7/101  soft-tissue]
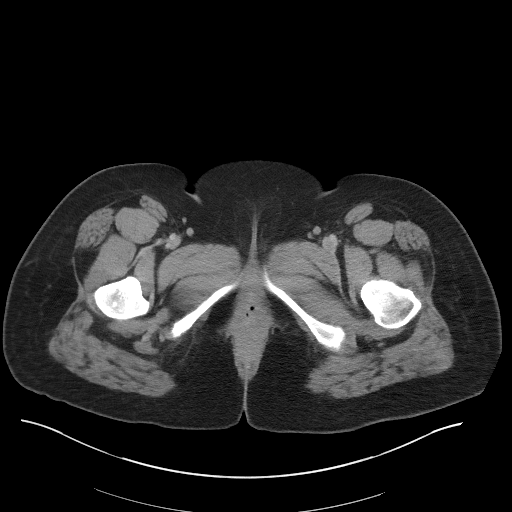
[im 7/101  bone]
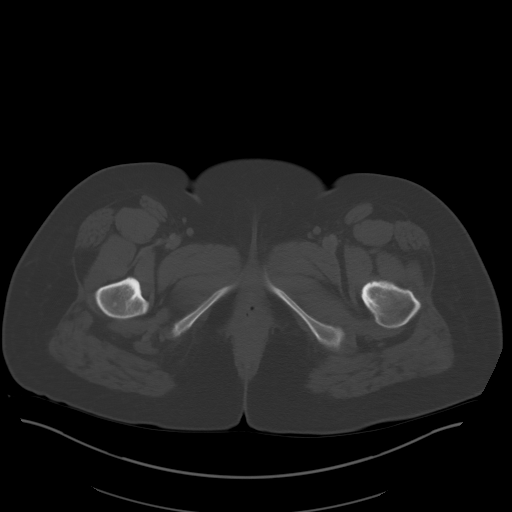
[im 13/101  soft-tissue]
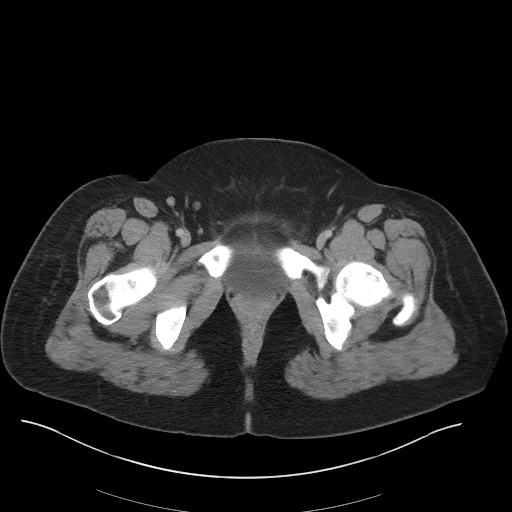
[im 19/101  soft-tissue]
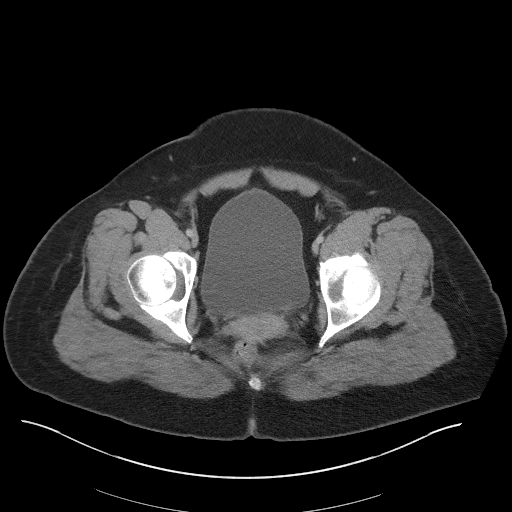
[im 32/101  soft-tissue]
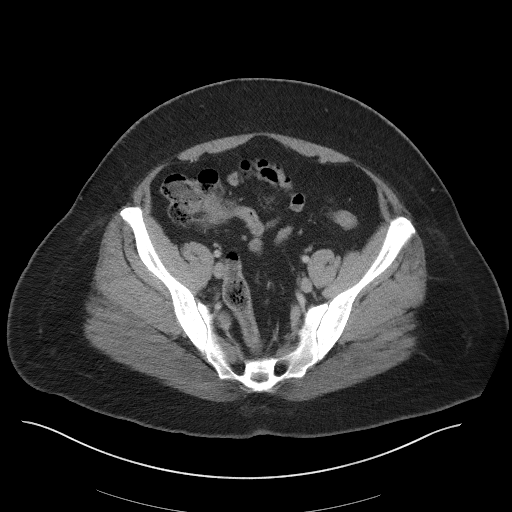
[im 38/101  soft-tissue]
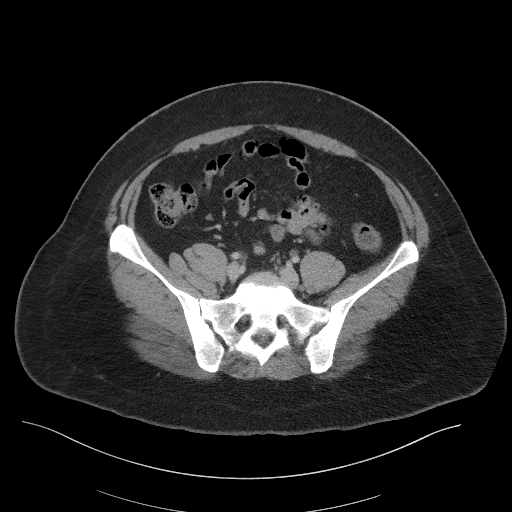
[im 44/101  soft-tissue]
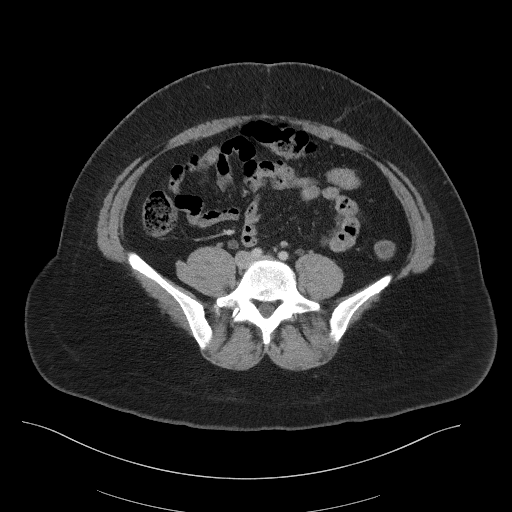
[im 51/101  soft-tissue]
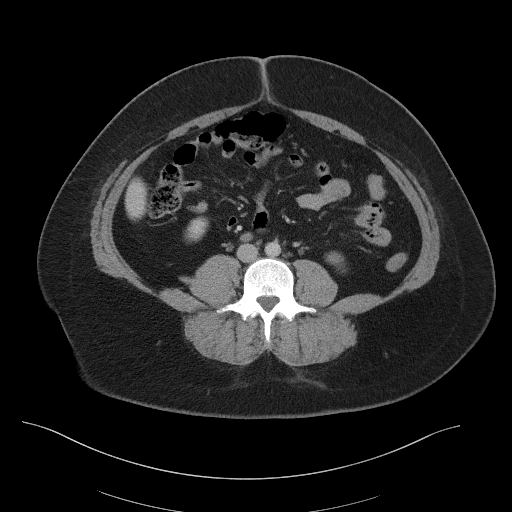
[im 57/101  soft-tissue]
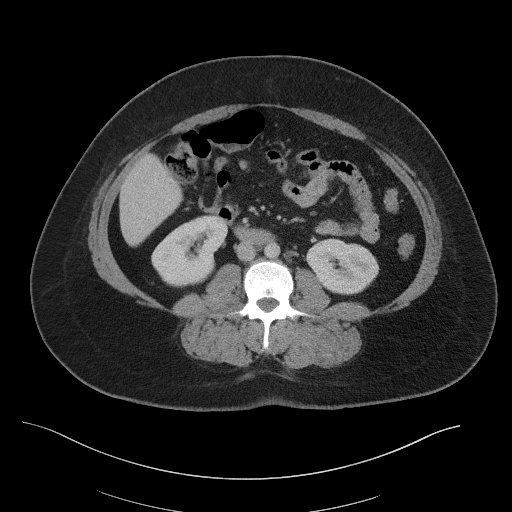
[im 63/101  soft-tissue]
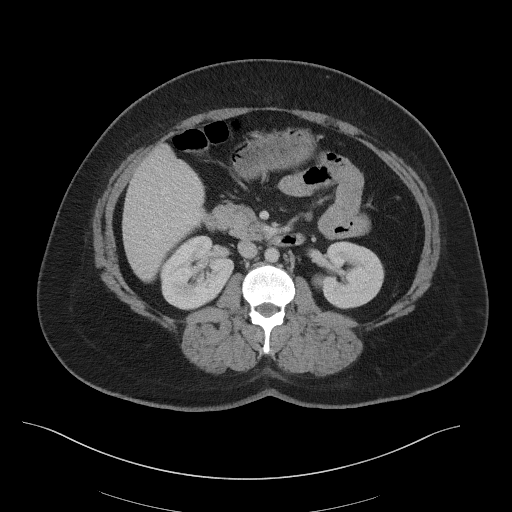
[im 63/101  bone]
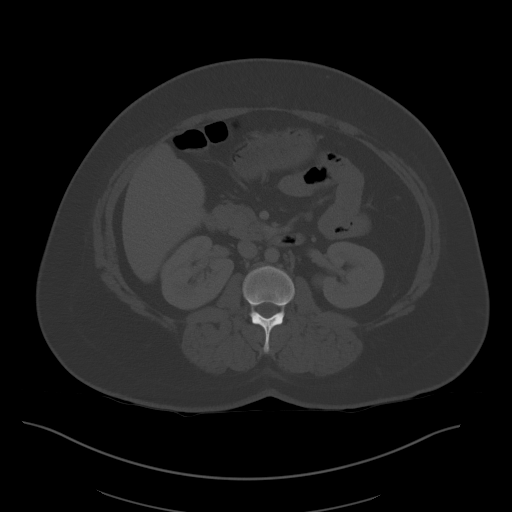
[im 69/101  soft-tissue]
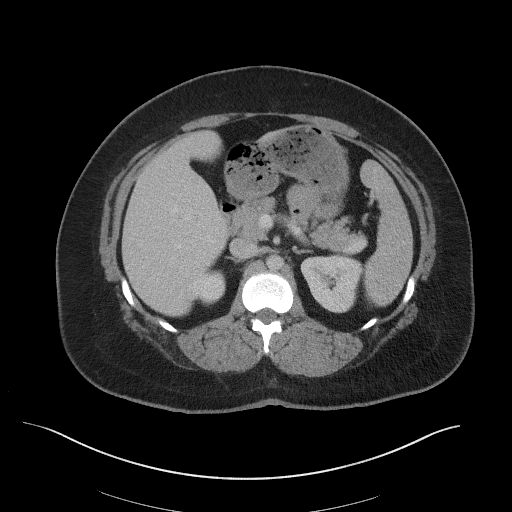
[im 82/101  soft-tissue]
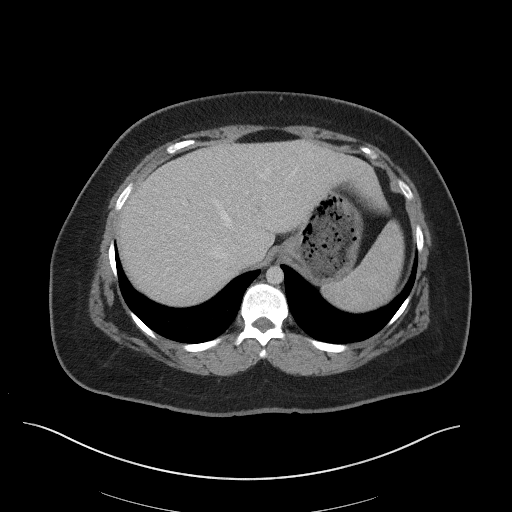
[im 88/101  soft-tissue]
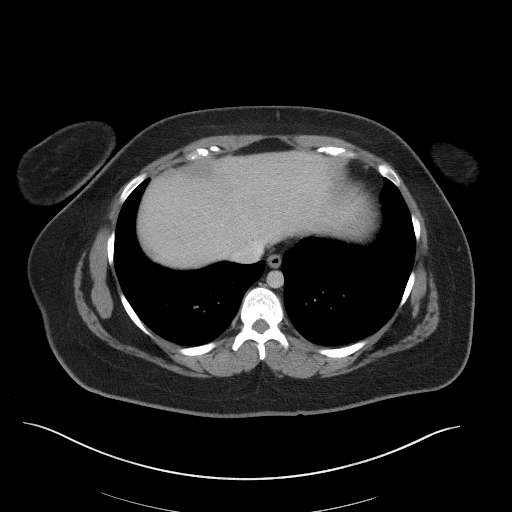
[im 94/101  soft-tissue]
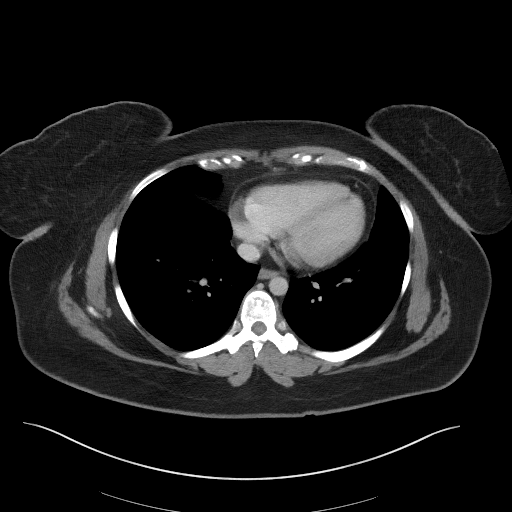

[Series 5: coronal st · coronal · 0.81mm/px · 3 of 121 slices shown]
[im 41/121  soft-tissue]
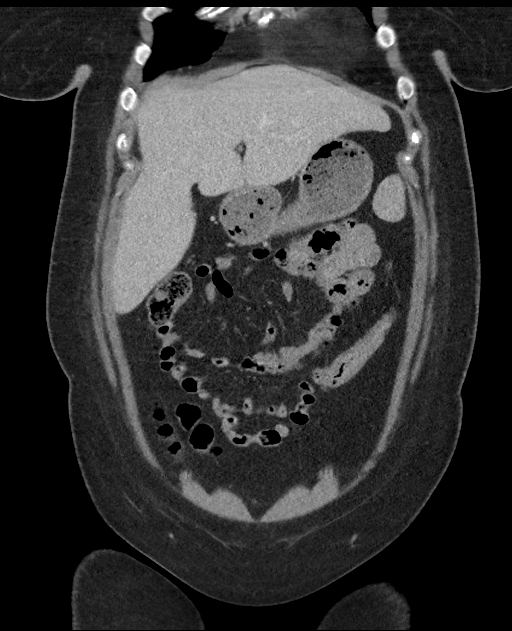
[im 54/121  soft-tissue]
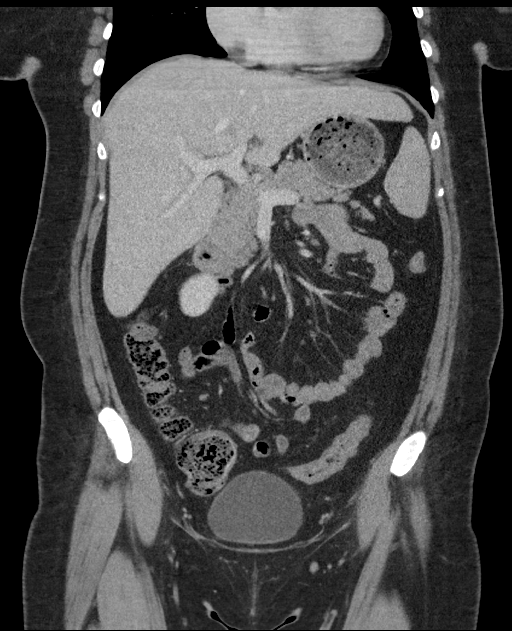
[im 67/121  soft-tissue]
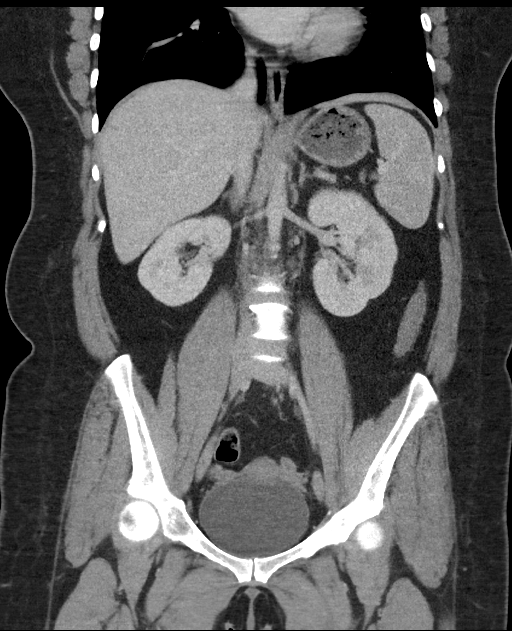

[16 of 46 positions shown; findings below may reference images not displayed]

FINDINGS: LOWER CHEST: Normal.

HEPATOBILIARY: Normal hepatic contours. No intra- or extrahepatic
biliary dilatation. Status post cholecystectomy.

PANCREAS: Normal pancreas. No ductal dilatation or peripancreatic
fluid collection.

SPLEEN: Normal.

ADRENALS/URINARY TRACT: The adrenal glands are normal. No
hydronephrosis, nephroureterolithiasis or solid renal mass. The
urinary bladder is normal for degree of distention

STOMACH/BOWEL: There is no hiatal hernia. Normal duodenal course and
caliber. No small bowel dilatation or inflammation. No focal colonic
abnormality. Normal appendix.

VASCULAR/LYMPHATIC: Normal course and caliber of the major abdominal
vessels. No abdominal or pelvic lymphadenopathy.

REPRODUCTIVE: Normal uterus and ovaries.

MUSCULOSKELETAL. No bony spinal canal stenosis or focal osseous
abnormality.

OTHER: None.
IMPRESSION: No acute abnormality of the abdomen or pelvis.

## 2019-05-19 MED ORDER — SODIUM CHLORIDE 0.9% FLUSH: 3.0000 mL | Freq: Once | INTRAVENOUS | Status: DC

## 2019-05-19 MED ORDER — MORPHINE SULFATE (PF) 4 MG/ML IV SOLN
4.0000 mg | Freq: Once | INTRAVENOUS | Status: DC
  Filled 2019-05-19: qty 1

## 2019-05-19 MED ORDER — IOHEXOL 300 MG/ML  SOLN
100.0000 mL | Freq: Once | INTRAMUSCULAR | Status: AC | PRN
  Administered 2019-05-20: 100 mL via INTRAVENOUS

## 2019-05-19 MED ORDER — ACETAMINOPHEN 325 MG PO TABS
650.0000 mg | ORAL_TABLET | Freq: Once | ORAL | Status: AC
  Administered 2019-05-19: 650 mg via ORAL
  Filled 2019-05-19: qty 2

## 2019-05-19 MED ORDER — SODIUM CHLORIDE 0.9 % IV BOLUS
1000.0000 mL | Freq: Once | INTRAVENOUS | Status: AC
  Administered 2019-05-19: 1000 mL via INTRAVENOUS

## 2019-05-19 NOTE — ED Provider Notes (Signed)
[facility] EMERGENCY DEPARTMENT Provider Note   CSN: [id number] Arrival date & time: 05/19/19  2053     History Chief Complaint  Patient presents with  . Emesis  . Diarrhea    Dana Mcpherson is a 29 y.o. female.  HPI      Dana N Kealey is a 29 y.o. female with past medical history of polycystic ovarian syndrome, tachycardia and asthma.  she presents to the Emergency Department complaining of persistent left-sided abdominal pain, diarrhea and vomiting.  She states she was seen here 6 days ago for lower abdominal pain and found to have a urinary tract infection.  She has been taking antibiotics since her previous visit.  She states the pain in her lower abdomen is not improving and today she noticed bright red blood from her rectum after defecation.  She describes having increased pain in her abdomen with defecating.  Pain is also worsened with food intake.  She denies fever, chills, chest pain, recent or known Covid exposures.   Past Medical History:  Diagnosis Date  . Allergy   . Anxiety   . Asthma   . Complication of anesthesia    woke up during surgery & ithing after surgery  . Family history of adverse reaction to anesthesia    "My mom has the same trouble with anesthesia".  . GERD (gastroesophageal reflux disease)   . Migraine headache   . Nexplanon in place 10/05/2015   06/29/15 left arm  . Polycystic ovarian syndrome   . Pregnancy induced hypertension   . Tachycardia   . Vaginal Pap smear, abnormal     Patient Active Problem List   Diagnosis Date Noted  . Abdominal bloating 02/20/2019  . Elevated LFTs 02/20/2019  . Gastroesophageal reflux disease 02/20/2019  . Dysphagia 02/20/2019  . Diarrhea 02/20/2019  . Atypical pneumonia 02/19/2016  . Pulmonary infiltrates 02/18/2016  . Hemorrhoid 06/29/2015  . Shortness of breath 11/04/2014  . Tachycardia 10/07/2014  . ASCUS with positive high risk HPV 05/28/2014  . PCOS (polycystic ovarian syndrome) 03/03/2014  .  Hyperinsulinemia 03/03/2014  . Morbid obesity ([place]) 05/23/2013  . DUB (dysfunctional uterine bleeding) 05/23/2013    Past Surgical History:  Procedure Laterality Date  . CHOLECYSTECTOMY  2010  . ESOPHAGOGASTRODUODENOSCOPY    . ESOPHAGOGASTRODUODENOSCOPY (EGD) WITH PROPOFOL N/A 04/08/2019   Procedure: ESOPHAGOGASTRODUODENOSCOPY (EGD) WITH PROPOFOL;  Surgeon: Daneil Dolin, MD;  Location: [facility];  Service: Endoscopy;  Laterality: N/A;  10:45am  . MALONEY DILATION N/A 04/08/2019   Procedure: Venia Minks DILATION;  Surgeon: Daneil Dolin, MD;  Location: [facility];  Service: Endoscopy;  Laterality: N/A;     OB History    Gravida  3   Para  2   Term  2   Preterm      AB  1   Living  2     SAB  1   TAB      Ectopic      Multiple  0   Live Births  2        Obstetric Comments  IOL for pre-e        Family History  Problem Relation Age of Onset  . Cancer Paternal Grandmother        ovarian cancer  . Asthma Son   . ADD / ADHD Son   . Colon cancer Other        late 22's    Social History   Tobacco Use  . Smoking  status: Former Smoker    Packs/day: 0.25    Years: 2.00    Pack years: 0.50    Types: Cigarettes    Quit date: 04/04/2010    Years since quitting: 9.1  . Smokeless tobacco: Never Used  Substance Use Topics  . Alcohol use: No  . Drug use: No    Home Medications Prior to Admission medications   Medication Sig Start Date End Date Taking? Authorizing Provider  CORLANOR 5 MG TABS tablet TAKE 1 & 1/2 TABLETS BY MOUTH 2 TIMES A DAY WITH A MEAL. Patient taking differently: Take 7.5 mg by mouth 2 (two) times daily.  01/29/19   Herminio Commons, MD  dexlansoprazole (DEXILANT) 60 MG capsule Take 1 capsule (60 mg total) by mouth daily. 02/20/19   Erenest Rasher, PA-C  dicyclomine (BENTYL) 10 MG capsule Take 1 capsule (10 mg total) by mouth 4 (four) times daily -  before meals and at bedtime. Patient taking differently: Take 10 mg by  mouth 4 (four) times daily as needed for spasms.  02/20/19   Erenest Rasher, PA-C  doxycycline (VIBRAMYCIN) 100 MG capsule Take 1 capsule (100 mg total) by mouth 2 (two) times daily. One po bid x 7 days 05/13/19   Domenic Moras, PA-C  Flaxseed, Linseed, (FLAXSEED OIL) 1200 MG CAPS Take 1,200 mg by mouth 2 (two) times daily.    [provider]  ibuprofen (ADVIL) 600 MG tablet Take 1 tablet (600 mg total) by mouth every 6 (six) hours as needed. 05/13/19   Domenic Moras, PA-C  INTROVALE 0.15-0.03 MG tablet TAKE 1T BY MOUTH EVERY DAY Patient taking differently: Take 1 tablet by mouth at bedtime.  03/21/18   Cresenzo-Dishmon, Joaquim Lai, CNM  topiramate (TOPAMAX) 25 MG capsule Take 50 mg by mouth 2 (two) times daily.    [provider]    Allergies    Other, Shellfish allergy, Adhesive [tape], Eggs or egg-derived products, and Latex  Review of Systems   Review of Systems  Constitutional: Negative for appetite change, chills and fever.  Respiratory: Negative for chest tightness and shortness of breath.   Cardiovascular: Negative for chest pain.  Gastrointestinal: Positive for abdominal pain, anal bleeding, diarrhea, nausea and vomiting. Negative for blood in stool.  Genitourinary: Negative for decreased urine volume, difficulty urinating, dysuria, vaginal bleeding and vaginal discharge.  Musculoskeletal: Negative for back pain, neck pain and neck stiffness.  Skin: Negative for color change and rash.  Neurological: Negative for dizziness, weakness and numbness.  Hematological: Negative for adenopathy.    Physical Exam Updated Vital Signs BP 133/71 (BP Location: Right Arm)   Pulse 84   Temp (!) 96.5 F (35.8 C) (Oral)   Resp 18   Ht 5' 6.5" (1.689 m)   Wt 108 kg   SpO2 100%   BMI 37.84 kg/m   Physical Exam Vitals and nursing note reviewed.  Constitutional:      Appearance: Normal appearance. She is not ill-appearing.  HENT:     Mouth/Throat:     Mouth: Mucous membranes  are moist.     Pharynx: Oropharynx is clear.  Cardiovascular:     Rate and Rhythm: Normal rate and regular rhythm.     Pulses: Normal pulses.     Heart sounds: No murmur.  Pulmonary:     Breath sounds: Normal breath sounds.  Chest:     Chest wall: No tenderness.  Abdominal:     Palpations: Abdomen is soft.     Tenderness: There  is abdominal tenderness in the left upper quadrant and left lower quadrant. There is no right CVA tenderness, left CVA tenderness or guarding.     Comments: Patient with tenderness palpation of the left upper and lower quadrants.  Bowel sounds are present x4 quadrants.  No guarding or rebound tenderness.  Abdomen is soft.  No CVA tenderness  Musculoskeletal:        General: Normal range of motion.  Skin:    General: Skin is warm.     Capillary Refill: Capillary refill takes less than 2 seconds.  Neurological:     General: No focal deficit present.     Sensory: No sensory deficit.     Motor: No weakness.     ED Results / Procedures / Treatments   Labs (all labs ordered are listed, but only abnormal results are displayed) Labs Reviewed  COMPREHENSIVE METABOLIC PANEL - Abnormal; Notable for the following components:      Result Value   CO2 19 (*)    Glucose, Bld 128 (*)    Total Protein 8.5 (*)    ALT 64 (*)    All other components within normal limits  CBC - Abnormal; Notable for the following components:   WBC 16.0 (*)    RBC 5.42 (*)    Hemoglobin 15.1 (*)    HCT 47.5 (*)    All other components within normal limits  URINALYSIS, ROUTINE W REFLEX MICROSCOPIC - Abnormal; Notable for the following components:   APPearance CLOUDY (*)    Specific Gravity, Urine 1.003 (*)    Hgb urine dipstick SMALL (*)    Leukocytes,Ua SMALL (*)    Bacteria, UA MANY (*)    All other components within normal limits  NOVEL CORONAVIRUS, NAA (HOSP ORDER, SEND-OUT TO REF LAB; TAT 18-24 HRS)  LIPASE, BLOOD  HCG, QUANTITATIVE, PREGNANCY    EKG None  Radiology CT  ABDOMEN PELVIS W CONTRAST  Result Date: 05/20/2019 CLINICAL DATA:  Nausea and vomiting. Abdominal pain. EXAM: CT ABDOMEN AND PELVIS WITH CONTRAST TECHNIQUE: Multidetector CT imaging of the abdomen and pelvis was performed using the standard protocol following bolus administration of intravenous contrast. CONTRAST:  173mL OMNIPAQUE IOHEXOL 300 MG/ML  SOLN COMPARISON:  04/23/2016 FINDINGS: LOWER CHEST: Normal. HEPATOBILIARY: Normal hepatic contours. No intra- or extrahepatic biliary dilatation. Status post cholecystectomy. PANCREAS: Normal pancreas. No ductal dilatation or peripancreatic fluid collection. SPLEEN: Normal. ADRENALS/URINARY TRACT: The adrenal glands are normal. No hydronephrosis, nephroureterolithiasis or solid renal mass. The urinary bladder is normal for degree of distention STOMACH/BOWEL: There is no hiatal hernia. Normal duodenal course and caliber. No small bowel dilatation or inflammation. No focal colonic abnormality. Normal appendix. VASCULAR/LYMPHATIC: Normal course and caliber of the major abdominal vessels. No abdominal or pelvic lymphadenopathy. REPRODUCTIVE: Normal uterus and ovaries. MUSCULOSKELETAL. No bony spinal canal stenosis or focal osseous abnormality. OTHER: None. IMPRESSION: No acute abnormality of the abdomen or pelvis. Electronically Signed   By: Ulyses Jarred M.D.   On: 05/20/2019 00:27    Procedures Procedures (including critical care time)  Medications Ordered in ED Medications  sodium chloride flush (NS) 0.9 % injection 3 mL (3 mLs Intravenous Not Given 05/19/19 2122)  morphine 4 MG/ML injection 4 mg (has no administration in time range)    ED Course  I have reviewed the triage vital signs and the nursing notes.  Pertinent labs & imaging results that were available during my care of the patient were reviewed by me and considered in my medical decision making (  see chart for details).    MDM Rules/Calculators/A&P                      Patient seen here on  05/13/2019 with extensive work-up including pelvic ultrasound that showed a slightly heterogeneous UTERUS and otherwise unremarkable ultrasound.  Urine culture from previous visit was likely contaminated.  She is continue to take antibiotics.  She does have left abdominal pain, she declined CT scan of her abdomen on previous visit but agrees to have imaging performed this evening.  We will recheck labs and order CT abdomen pelvis.  CT scan of abd/pelvis is negative for acute process.  Sx's likely related to gastroentritis.  No known COVID exposures, but PCR test pending.  Pt non-toxic appearing.  rx written for anti-emetic, return precautions discussed and agrees to isolate at home until test results are back  Dana N Brine was evaluated in Emergency Department on 05/20/2019 for the symptoms described in the history of present illness. She was evaluated in the context of the global COVID-19 pandemic, which necessitated consideration that the patient might be at risk for infection with the SARS-CoV-2 virus that causes COVID-19. Institutional protocols and algorithms that pertain to the evaluation of patients at risk for COVID-19 are in a state of rapid change based on information released by regulatory bodies including the CDC and federal and state organizations. These policies and algorithms were followed during the patient's care in the ED.    Final Clinical Impression(s) / ED Diagnoses Final diagnoses:  Nausea vomiting and diarrhea    Rx / DC Orders ED Discharge Orders    None       Kem Parkinson, PA-C 05/20/19 0050    Lajean Saver, MD 05/20/19 1253

## 2019-05-19 NOTE — ED Triage Notes (Signed)
Pt with diarrhea and emesis for a week, denies fever, noted bright red blood in stool now.

## 2019-05-20 MED ORDER — ONDANSETRON HCL 4 MG PO TABS: 4.0000 mg | ORAL_TABLET | Freq: Four times a day (QID) | ORAL | 0 refills | Status: DC

## 2019-05-20 NOTE — Discharge Instructions (Addendum)
Small, frequent sips of fluids, bland diet as tolerated.  Try taking over the counter Imodium as directed to help with diarrhea.  Follow-up with your primary doctor for recheck.

## 2019-05-21 LAB — NOVEL CORONAVIRUS, NAA (HOSP ORDER, SEND-OUT TO REF LAB; TAT 18-24 HRS): SARS-CoV-2, NAA: NOT DETECTED

## 2019-05-22 ENCOUNTER — Other Ambulatory Visit: Payer: Medicaid Other | Admitting: Advanced Practice Midwife

## 2019-05-23 NOTE — H&P (View-Only) (Signed)
Referring Provider: Sharilyn Sites, MD Primary Care Physician:  Sharilyn Sites, MD Primary GI Physician: Dr. Gala Romney  Chief Complaint  Patient presents with  . Abdominal Pain    left quad pain most of the time    HPI:   Dana Mcpherson is a 29 y.o. female presenting for follow-up of GERD, dysphagia, diarrhea, elevated LFTs. Also with acute complaint of abdominal pain.   She was last seen in our office for initial consult on 02/20/2019. Patient reported history of significant reflux that had been progressively worsening since 2016 during pregnancy.  Stated she would throw up from bending over and always have acid in the back of her throat.  Had been on Protonix 6 months and had not thrown up since then but continued with sensation of acid in her throat several days a week and nausea. Noted taking ibuprofen daily.  Dysphagia with pills, corn, peas, sometimes meats.  Abdominal bloating in the setting of broccoli and beans.  Lower abdominal pain prior to BMs.  BMs typically postprandial and loose to watery. For upper GI symptoms, Protonix was changed to Dexilant and she is scheduled for EGD/ED.  Also advised to avoid gas producing foods and follow a GERD diet and lifestyle.  Stop NSAIDs.  For diarrhea, suspected bile salt diarrhea versus IBS-D.  Bentyl prescribed.  Update CMP, check hepatitis C antibody, and obtain abdominal ultrasound for elevated LFTs.  Hepatitis C antibody nonreactive.  CMP remarkable for ALT 65H, otherwise normal.  Ultrasound was unremarkable.  Liver within normal limits in parenchymal echogenicity.  Additional labs on 04/05/2019 with CBC essentially normal except minimally elevated WBC at 10.7.  CMP remarkable for ALT 56H, otherwise normal.  Hepatitis B surface antigen and core antibody non-reactive.  Hepatitis A antibody non-reactive.  Iron panel with ferritin within normal limits.  PT/INR normal.  EGD 04/08/2019: Erosive reflux esophagitis, dilation performed.  Stomach with  abnormal congestion diffusely with snakeskin appearance, antral erosions.  Pylorus patent.  Congested eroded duodenal bulb, otherwise normal. Recommended to continue Dexilant.   Patient presented to the ED on 05/19/2019 after being seen on 05/13/2019 diagnosed with a UTI.  She reported taking her antibiotics, left lower abdominal pain was not improving, had diarrhea and vomiting, and noticed bright red blood from her rectum after BM. CBC remarkable for WBC 16H.  CMP remarkable for glucose 128H, ALT 64H.  Lipase normal.  Covid negative.  CT abdomen and pelvis with no acute abnormalities.  Suspected patient had acute gastroenteritis and was prescribed Zofran.  Today:  Rare breakthrough on Dexilant. Symptoms much improved. Dysphagia improved/resolved.   Diarrhea: Didn't take bentyl because she was scared of constipation after I told her the possible side effects.   Left sided abdominal Pain: Mom and sister had a virus 2 weeks ago. Thought she had their virus. They had diarrhea and vomiting. She developed diarrhea. Was sick for 3 days. Improved for 1-2 days. Then diarrhea returned. Started having extreme pain on the left side of her abdomen. If she lays on the left, she feels sick like she will vomit. Symptoms seems to resolve for a day or two then come back. Every time she eats, she has diarrhea and has abdominal pain. The severity of pain makes her vomit. Tried a clear liquid diet. Has been to the hospital twice. Was on antibiotics for UTI. Competed these. Went to PCP Monday due to continuing to feel bad, wasn't eating much, feeling dizzy. PCP gave her a shot of Rocephin due  to elevated white count. Thought she had an infection in her stomach.   Yesterday was the first day she was able to eat something. Still had a lot of pain. Still had a BM after eating but didn't have diarrhea. Stool was more mushy. No BM today. Prior the stools were watery. Was having 3 BMs a day. About 1-2 BMs at night. No antibiotics  prior to onset of symptoms. No travel. Doesn't drink well water. Doesn't think she had any undercooked meat or recently eaten out. No contact with livestock. No fever or chills. Will get hot when having to use the bathroom due to pain.  Had bright red blood in her stool. This happened several times for at least 2-3 days. Blood was in toilet water and on toilet tissue. Last episode was a couple days ago. No black stools.  Abdominal pain is cramping and stabbing. Present to some degree all the time, worse after eating or prior to BMs. About a 6-7/10. Feels nauseated from the pain. Using zofran as needed.   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 Went to urologists in the past. Would need chronic antibiotic. Didn't want to do that. Had back pain prior to completing antibiotics. None at this time.   Mom and sister are no longer having symptoms.   Elevated LFTs: No swelling in abdomen or lower extremities.  No confusion.  No yellowing of the eyes or skin.  No easy bruising.  No OTC supplements.  No regular Tylenol use.  Past Medical History:  Diagnosis Date  . Allergy   . Anxiety   . Asthma   . Complication of anesthesia    woke up during surgery & ithing after surgery  . Family history of adverse reaction to anesthesia    "My mom has the same trouble with anesthesia".  . GERD (gastroesophageal reflux disease)   . Migraine headache   . Nexplanon in place 10/05/2015   06/29/15 left arm  . Polycystic ovarian syndrome   . Pregnancy induced hypertension   . Tachycardia   . Vaginal Pap smear, abnormal     Past Surgical History:  Procedure Laterality Date  . CHOLECYSTECTOMY  2010  . ESOPHAGOGASTRODUODENOSCOPY    . ESOPHAGOGASTRODUODENOSCOPY (EGD) WITH PROPOFOL N/A 04/08/2019   Procedure: ESOPHAGOGASTRODUODENOSCOPY (EGD) WITH PROPOFOL;  Surgeon: Daneil Dolin, MD;  [facility];  Service: Endoscopy;  Laterality: N/A;  10:45am  . MALONEY DILATION N/A 04/08/2019    Procedure: Venia Minks DILATION;  Surgeon: Daneil Dolin, MD;  Location: [facility];  Service: Endoscopy;  Laterality: N/A;    Current Outpatient Medications  Medication Sig Dispense Refill  . CORLANOR 5 MG TABS tablet TAKE 1 & 1/2 TABLETS BY MOUTH 2 TIMES A DAY WITH A MEAL. (Patient taking differently: Take 7.5 mg by mouth 2 (two) times daily. ) 270 tablet 3  . dexlansoprazole (DEXILANT) 60 MG capsule Take 1 capsule (60 mg total) by mouth daily. 90 capsule 3  . Flaxseed, Linseed, (FLAXSEED OIL) 1200 MG CAPS Take 1,200 mg by mouth 2 (two) times daily.    Marland Kitchen ibuprofen (ADVIL) 600 MG tablet Take 1 tablet (600 mg total) by mouth every 6 (six) hours as needed. 30 tablet 0  . INTROVALE 0.15-0.03 MG tablet TAKE 1T BY MOUTH EVERY DAY (Patient taking differently: Take 1 tablet by mouth at bedtime. ) 91 tablet 11  . topiramate (TOPAMAX) 25 MG capsule Take 50 mg by mouth 2 (two) times daily.    Marland Kitchen  dicyclomine (BENTYL) 10 MG capsule Take 1 capsule (10 mg total) by mouth 4 (four) times daily -  before meals and at bedtime. (Patient not taking: Reported on 05/24/2019) 90 capsule 0  . doxycycline (VIBRAMYCIN) 100 MG capsule Take 1 capsule (100 mg total) by mouth 2 (two) times daily. One po bid x 7 days (Patient not taking: Reported on 05/24/2019) 14 capsule 0  . ondansetron (ZOFRAN) 4 MG tablet Take 1 tablet (4 mg total) by mouth every 8 (eight) hours as needed for nausea or vomiting. 30 tablet 1   No current facility-administered medications for this visit.    Allergies as of 05/24/2019 - Review Complete 05/24/2019  Allergen Reaction Noted  . Other Anaphylaxis and Other (See Comments) 08/30/2012  . Shellfish allergy Anaphylaxis and Hives 05/20/2013  . Adhesive [tape] Hives 10/10/2014  . Eggs or egg-derived products Itching and Nausea Only 07/05/2012  . Latex Itching 11/04/2014    Family History  Problem Relation Age of Onset  . Cancer Paternal Grandmother        ovarian cancer  . Asthma Son   . ADD /  ADHD Son   . Colon cancer Other        late 42's    Social History   Socioeconomic History  . Marital status: Married    Spouse name: Not on file  . Number of children: Not on file  . Years of education: Not on file  . Highest education level: Not on file  Occupational History  . Not on file  Tobacco Use  . Smoking status: Former Smoker    Packs/day: 0.25    Years: 2.00    Pack years: 0.50    Types: Cigarettes    Quit date: 04/04/2010    Years since quitting: 9.1  . Smokeless tobacco: Never Used  Substance and Sexual Activity  . Alcohol use: No  . Drug use: No  . Sexual activity: Yes    Birth control/protection: Pill  Other Topics Concern  . Not on file  Social History Narrative  . Not on file   Social Determinants of Health   Financial Resource Strain:   . Difficulty of Paying Living Expenses: Not on file  Food Insecurity:   . Worried About Charity fundraiser in the Last Year: Not on file  . Ran Out of Food in the Last Year: Not on file  Transportation Needs:   . Lack of Transportation (Medical): Not on file  . Lack of Transportation (Non-Medical): Not on file  Physical Activity:   . Days of Exercise per Week: Not on file  . Minutes of Exercise per Session: Not on file  Stress:   . Feeling of Stress : Not on file  Social Connections:   . Frequency of Communication with Friends and Family: Not on file  . Frequency of Social Gatherings with Friends and Family: Not on file  . Attends Religious Services: Not on file  . Active Member of Clubs or Organizations: Not on file  . Attends Archivist Meetings: Not on file  . Marital Status: Not on file    Review of Systems: Gen: Denies fever, chills. No lightheadedness today.  CV: Denies chest pain or palpitations Resp: Shortness of breath or cough. GI: See HPI Derm: Denies rash Heme: See HPI  Physical Exam: BP 120/78   Pulse 79   Temp 98.2 F (36.8 C) (Temporal)   Ht 5' 6.5" (1.689 m)   Wt 231  lb 9.6  oz (105.1 kg)   LMP 03/23/2019   BMI 36.82 kg/m  General:   Alert and oriented. No distress noted. Pleasant and cooperative.  Head:  Normocephalic and atraumatic. Eyes:  Conjuctiva clear without scleral icterus. Heart:  S1, S2 present without murmurs appreciated. Lungs:  Clear to auscultation bilaterally. No wheezes, rales, or rhonchi. No distress.  Abdomen:  +BS, soft, non-distended.  Mild to moderate tenderness to palpation of the left abdomen, greatest in the left upper quadrant.  No rebound or guarding. No HSM or masses noted. Msk:  Symmetrical without gross deformities. Normal posture. Extremities:  Without edema. Neurologic:  Alert and  oriented x4 Psych:  Normal mood and affect.

## 2019-05-23 NOTE — Progress Notes (Signed)
Referring Provider: Sharilyn Sites, MD Primary Care Physician:  Sharilyn Sites, MD Primary GI Physician: Dr. Gala Romney  Chief Complaint  Patient presents with  . Abdominal Pain    left quad pain most of the time    HPI:   Dana Mcpherson is a 29 y.o. female presenting for follow-up of GERD, dysphagia, diarrhea, elevated LFTs. Also with acute complaint of abdominal pain.   She was last seen in our office for initial consult on 02/20/2019. Patient reported history of significant reflux that had been progressively worsening since 2016 during pregnancy.  Stated she would throw up from bending over and always have acid in the back of her throat.  Had been on Protonix 6 months and had not thrown up since then but continued with sensation of acid in her throat several days a week and nausea. Noted taking ibuprofen daily.  Dysphagia with pills, corn, peas, sometimes meats.  Abdominal bloating in the setting of broccoli and beans.  Lower abdominal pain prior to BMs.  BMs typically postprandial and loose to watery. For upper GI symptoms, Protonix was changed to Dexilant and she is scheduled for EGD/ED.  Also advised to avoid gas producing foods and follow a GERD diet and lifestyle.  Stop NSAIDs.  For diarrhea, suspected bile salt diarrhea versus IBS-D.  Bentyl prescribed.  Update CMP, check hepatitis C antibody, and obtain abdominal ultrasound for elevated LFTs.  Hepatitis C antibody nonreactive.  CMP remarkable for ALT 65H, otherwise normal.  Ultrasound was unremarkable.  Liver within normal limits in parenchymal echogenicity.  Additional labs on 04/05/2019 with CBC essentially normal except minimally elevated WBC at 10.7.  CMP remarkable for ALT 56H, otherwise normal.  Hepatitis B surface antigen and core antibody non-reactive.  Hepatitis A antibody non-reactive.  Iron panel with ferritin within normal limits.  PT/INR normal.  EGD 04/08/2019: Erosive reflux esophagitis, dilation performed.  Stomach with  abnormal congestion diffusely with snakeskin appearance, antral erosions.  Pylorus patent.  Congested eroded duodenal bulb, otherwise normal. Recommended to continue Dexilant.   Patient presented to the ED on 05/19/2019 after being seen on 05/13/2019 diagnosed with a UTI.  She reported taking her antibiotics, left lower abdominal pain was not improving, had diarrhea and vomiting, and noticed bright red blood from her rectum after BM. CBC remarkable for WBC 16H.  CMP remarkable for glucose 128H, ALT 64H.  Lipase normal.  Covid negative.  CT abdomen and pelvis with no acute abnormalities.  Suspected patient had acute gastroenteritis and was prescribed Zofran.  Today:  Rare breakthrough on Dexilant. Symptoms much improved. Dysphagia improved/resolved.   Diarrhea: Didn't take bentyl because she was scared of constipation after I told her the possible side effects.   Left sided abdominal Pain: Mom and sister had a virus 2 weeks ago. Thought she had their virus. They had diarrhea and vomiting. She developed diarrhea. Was sick for 3 days. Improved for 1-2 days. Then diarrhea returned. Started having extreme pain on the left side of her abdomen. If she lays on the left, she feels sick like she will vomit. Symptoms seems to resolve for a day or two then come back. Every time she eats, she has diarrhea and has abdominal pain. The severity of pain makes her vomit. Tried a clear liquid diet. Has been to the hospital twice. Was on antibiotics for UTI. Competed these. Went to PCP Monday due to continuing to feel bad, wasn't eating much, feeling dizzy. PCP gave her a shot of Rocephin due  to elevated white count. Thought she had an infection in her stomach.   Yesterday was the first day she was able to eat something. Still had a lot of pain. Still had a BM after eating but didn't have diarrhea. Stool was more mushy. No BM today. Prior the stools were watery. Was having 3 BMs a day. About 1-2 BMs at night. No antibiotics  prior to onset of symptoms. No travel. Doesn't drink well water. Doesn't think she had any undercooked meat or recently eaten out. No contact with livestock. No fever or chills. Will get hot when having to use the bathroom due to pain.  Had bright red blood in her stool. This happened several times for at least 2-3 days. Blood was in toilet water and on toilet tissue. Last episode was a couple days ago. No black stools.  Abdominal pain is cramping and stabbing. Present to some degree all the time, worse after eating or prior to BMs. About a 6-7/10. Feels nauseated from the pain. Using zofran as needed.   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 Went to urologists in the past. Would need chronic antibiotic. Didn't want to do that. Had back pain prior to completing antibiotics. None at this time.   Mom and sister are no longer having symptoms.   Elevated LFTs: No swelling in abdomen or lower extremities.  No confusion.  No yellowing of the eyes or skin.  No easy bruising.  No OTC supplements.  No regular Tylenol use.  Past Medical History:  Diagnosis Date  . Allergy   . Anxiety   . Asthma   . Complication of anesthesia    woke up during surgery & ithing after surgery  . Family history of adverse reaction to anesthesia    "My mom has the same trouble with anesthesia".  . GERD (gastroesophageal reflux disease)   . Migraine headache   . Nexplanon in place 10/05/2015   06/29/15 left arm  . Polycystic ovarian syndrome   . Pregnancy induced hypertension   . Tachycardia   . Vaginal Pap smear, abnormal     Past Surgical History:  Procedure Laterality Date  . CHOLECYSTECTOMY  2010  . ESOPHAGOGASTRODUODENOSCOPY    . ESOPHAGOGASTRODUODENOSCOPY (EGD) WITH PROPOFOL N/A 04/08/2019   Procedure: ESOPHAGOGASTRODUODENOSCOPY (EGD) WITH PROPOFOL;  Surgeon: Daneil Dolin, MD;  Location: [facility];  Service: Endoscopy;  Laterality: N/A;  10:45am  . MALONEY DILATION N/A 04/08/2019    Procedure: Venia Minks DILATION;  Surgeon: Daneil Dolin, MD;  Location: [facility];  Service: Endoscopy;  Laterality: N/A;    Current Outpatient Medications  Medication Sig Dispense Refill  . CORLANOR 5 MG TABS tablet TAKE 1 & 1/2 TABLETS BY MOUTH 2 TIMES A DAY WITH A MEAL. (Patient taking differently: Take 7.5 mg by mouth 2 (two) times daily. ) 270 tablet 3  . dexlansoprazole (DEXILANT) 60 MG capsule Take 1 capsule (60 mg total) by mouth daily. 90 capsule 3  . Flaxseed, Linseed, (FLAXSEED OIL) 1200 MG CAPS Take 1,200 mg by mouth 2 (two) times daily.    Marland Kitchen ibuprofen (ADVIL) 600 MG tablet Take 1 tablet (600 mg total) by mouth every 6 (six) hours as needed. 30 tablet 0  . INTROVALE 0.15-0.03 MG tablet TAKE 1T BY MOUTH EVERY DAY (Patient taking differently: Take 1 tablet by mouth at bedtime. ) 91 tablet 11  . topiramate (TOPAMAX) 25 MG capsule Take 50 mg by mouth 2 (two) times daily.    Marland Kitchen  dicyclomine (BENTYL) 10 MG capsule Take 1 capsule (10 mg total) by mouth 4 (four) times daily -  before meals and at bedtime. (Patient not taking: Reported on 05/24/2019) 90 capsule 0  . doxycycline (VIBRAMYCIN) 100 MG capsule Take 1 capsule (100 mg total) by mouth 2 (two) times daily. One po bid x 7 days (Patient not taking: Reported on 05/24/2019) 14 capsule 0  . ondansetron (ZOFRAN) 4 MG tablet Take 1 tablet (4 mg total) by mouth every 8 (eight) hours as needed for nausea or vomiting. 30 tablet 1   No current facility-administered medications for this visit.    Allergies as of 05/24/2019 - Review Complete 05/24/2019  Allergen Reaction Noted  . Other Anaphylaxis and Other (See Comments) 08/30/2012  . Shellfish allergy Anaphylaxis and Hives 05/20/2013  . Adhesive [tape] Hives 10/10/2014  . Eggs or egg-derived products Itching and Nausea Only 07/05/2012  . Latex Itching 11/04/2014    Family History  Problem Relation Age of Onset  . Cancer Paternal Grandmother        ovarian cancer  . Asthma Son   . ADD /  ADHD Son   . Colon cancer Other        late 107's    Social History   Socioeconomic History  . Marital status: Married    Spouse name: Not on file  . Number of children: Not on file  . Years of education: Not on file  . Highest education level: Not on file  Occupational History  . Not on file  Tobacco Use  . Smoking status: Former Smoker    Packs/day: 0.25    Years: 2.00    Pack years: 0.50    Types: Cigarettes    Quit date: 04/04/2010    Years since quitting: 9.1  . Smokeless tobacco: Never Used  Substance and Sexual Activity  . Alcohol use: No  . Drug use: No  . Sexual activity: Yes    Birth control/protection: Pill  Other Topics Concern  . Not on file  Social History Narrative  . Not on file   Social Determinants of Health   Financial Resource Strain:   . Difficulty of Paying Living Expenses: Not on file  Food Insecurity:   . Worried About Charity fundraiser in the Last Year: Not on file  . Ran Out of Food in the Last Year: Not on file  Transportation Needs:   . Lack of Transportation (Medical): Not on file  . Lack of Transportation (Non-Medical): Not on file  Physical Activity:   . Days of Exercise per Week: Not on file  . Minutes of Exercise per Session: Not on file  Stress:   . Feeling of Stress : Not on file  Social Connections:   . Frequency of Communication with Friends and Family: Not on file  . Frequency of Social Gatherings with Friends and Family: Not on file  . Attends Religious Services: Not on file  . Active Member of Clubs or Organizations: Not on file  . Attends Archivist Meetings: Not on file  . Marital Status: Not on file    Review of Systems: Gen: Denies fever, chills. No lightheadedness today.  CV: Denies chest pain or palpitations Resp: Shortness of breath or cough. GI: See HPI Derm: Denies rash Heme: See HPI  Physical Exam: BP 120/78   Pulse 79   Temp 98.2 F (36.8 C) (Temporal)   Ht 5' 6.5" (1.689 m)   Wt 231  lb 9.6  oz (105.1 kg)   LMP 03/23/2019   BMI 36.82 kg/m  General:   Alert and oriented. No distress noted. Pleasant and cooperative.  Head:  Normocephalic and atraumatic. Eyes:  Conjuctiva clear without scleral icterus. Heart:  S1, S2 present without murmurs appreciated. Lungs:  Clear to auscultation bilaterally. No wheezes, rales, or rhonchi. No distress.  Abdomen:  +BS, soft, non-distended.  Mild to moderate tenderness to palpation of the left abdomen, greatest in the left upper quadrant.  No rebound or guarding. No HSM or masses noted. Msk:  Symmetrical without gross deformities. Normal posture. Extremities:  Without edema. Neurologic:  Alert and  oriented x4 Psych:  Normal mood and affect.

## 2019-05-24 ENCOUNTER — Encounter: Payer: Self-pay | Admitting: Gastroenterology

## 2019-05-24 ENCOUNTER — Other Ambulatory Visit (HOSPITAL_COMMUNITY)
Admission: RE | Admit: 2019-05-24 | Discharge: 2019-05-24 | Disposition: A | Discharge status: Home / Self Care | Payer: Medicaid Other | Source: Ambulatory Visit | Attending: Gastroenterology | Admitting: Gastroenterology

## 2019-05-24 ENCOUNTER — Ambulatory Visit (INDEPENDENT_AMBULATORY_CARE_PROVIDER_SITE_OTHER): Payer: Medicaid Other | Admitting: Gastroenterology

## 2019-05-24 ENCOUNTER — Other Ambulatory Visit: Payer: Self-pay

## 2019-05-24 VITALS — BP 120/78 | HR 79 | Temp 98.2°F | Ht 66.5 in | Wt 231.6 lb

## 2019-05-24 DIAGNOSIS — R197 Diarrhea, unspecified: Secondary | ICD-10-CM | POA: Insufficient documentation

## 2019-05-24 DIAGNOSIS — R1310 Dysphagia, unspecified: Secondary | ICD-10-CM

## 2019-05-24 DIAGNOSIS — R7989 Other specified abnormal findings of blood chemistry: Secondary | ICD-10-CM

## 2019-05-24 DIAGNOSIS — K219 Gastro-esophageal reflux disease without esophagitis: Secondary | ICD-10-CM

## 2019-05-24 DIAGNOSIS — R112 Nausea with vomiting, unspecified: Secondary | ICD-10-CM

## 2019-05-24 DIAGNOSIS — R109 Unspecified abdominal pain: Secondary | ICD-10-CM | POA: Diagnosis present

## 2019-05-24 LAB — CBC WITH DIFFERENTIAL/PLATELET
Abs Immature Granulocytes: 0.03 10*3/uL (ref 0.00–0.07)
Basophils Absolute: 0.1 10*3/uL (ref 0.0–0.1)
Basophils Relative: 1 %
Eosinophils Absolute: 0.1 10*3/uL (ref 0.0–0.5)
Eosinophils Relative: 1 %
HCT: 42.7 % (ref 36.0–46.0)
Hemoglobin: 14.3 g/dL (ref 12.0–15.0)
Immature Granulocytes: 0 %
Lymphocytes Relative: 22 %
Lymphs Abs: 2.1 10*3/uL (ref 0.7–4.0)
MCH: 28.6 pg (ref 26.0–34.0)
MCHC: 33.5 g/dL (ref 30.0–36.0)
MCV: 85.4 fL (ref 80.0–100.0)
Monocytes Absolute: 0.6 10*3/uL (ref 0.1–1.0)
Monocytes Relative: 6 %
Neutro Abs: 7 10*3/uL (ref 1.7–7.7)
Neutrophils Relative %: 70 %
Platelets: 384 10*3/uL (ref 150–400)
RBC: 5 MIL/uL (ref 3.87–5.11)
RDW: 14 % (ref 11.5–15.5)
WBC: 9.9 10*3/uL (ref 4.0–10.5)
nRBC: 0 % (ref 0.0–0.2)

## 2019-05-24 LAB — HEPATITIS B SURFACE ANTIGEN: Hepatitis B Surface Ag: NONREACTIVE

## 2019-05-24 MED ORDER — ONDANSETRON HCL 4 MG PO TABS: 4.0000 mg | ORAL_TABLET | Freq: Three times a day (TID) | ORAL | 1 refills | Status: DC | PRN

## 2019-05-24 NOTE — Patient Instructions (Addendum)
Please have labs completed.  Please add Bentyl 10 mg daily.  You may take this up to 3 times daily before meals and at bedtime as needed.  If you develop constipation, hold Bentyl.  Hopefully this will help with your abdominal cramping.  Continue to monitor for return of diarrhea.  I am ordering stool studies.  If your diarrhea returns, please complete the stool studies.  Continue taking Zofran as needed for nausea/vomiting.  I am refilling this for you.  We will tentatively plan to follow-up with you in 3 months.  Please call with a progress report in 1-2 weeks or sooner if needed.  Follow a bland diet for now.   Continue Dexilant daily.  Dana Altes, PA-C [facility]  Criss Rosales Diet A bland diet consists of foods that are often soft and do not have a lot of fat, fiber, or extra seasonings. Foods without fat, fiber, or seasoning are easier for the body to digest. They are also less likely to irritate your mouth, throat, stomach, and other parts of your digestive system. A bland diet is sometimes called a BRAT diet. What is my plan? Your health care provider or food and nutrition specialist (dietitian) may recommend specific changes to your diet to prevent symptoms or to treat your symptoms. These changes may include:  Eating small meals often.  Cooking food until it is soft enough to chew easily.  Chewing your food well.  Drinking fluids slowly.  Not eating foods that are very spicy, sour, or fatty.  Not eating citrus fruits, such as oranges and grapefruit. What do I need to know about this diet?  Eat a variety of foods from the bland diet food list.  Do not follow a bland diet longer than needed.  Ask your health care provider whether you should take vitamins or supplements. What foods can I eat? Grains  Hot cereals, such as cream of wheat. Rice. Bread, crackers, or tortillas made from refined white flour. Vegetables Canned or cooked vegetables. Mashed or  boiled potatoes. Fruits  Bananas. Applesauce. Other types of cooked or canned fruit with the skin and seeds removed, such as canned peaches or pears. Meats and other proteins  Scrambled eggs. Creamy peanut butter or other nut butters. Lean, well-cooked meats, such as chicken or fish. Tofu. Soups or broths. Dairy Low-fat dairy products, such as milk, cottage cheese, or yogurt. Beverages  Water. Herbal tea. Apple juice. Fats and oils Mild salad dressings. Canola or olive oil. Sweets and desserts Pudding. Custard. Fruit gelatin. Ice cream. The items listed above may not be a complete list of recommended foods and beverages. Contact a dietitian for more options. What foods are not recommended? Grains Whole grain breads and cereals. Vegetables Raw vegetables. Fruits Raw fruits, especially citrus, berries, or dried fruits. Dairy Whole fat dairy foods. Beverages Caffeinated drinks. Alcohol. Seasonings and condiments Strongly flavored seasonings or condiments. Hot sauce. Salsa. Other foods Spicy foods. Fried foods. Sour foods, such as pickled or fermented foods. Foods with high sugar content. Foods high in fiber. The items listed above may not be a complete list of foods and beverages to avoid. Contact a dietitian for more information. Summary  A bland diet consists of foods that are often soft and do not have a lot of fat, fiber, or extra seasonings.  Foods without fat, fiber, or seasoning are easier for the body to digest.  Check with your health care provider to see how long you should follow this diet plan. It  is not meant to be followed for long periods. This information is not intended to replace advice given to you by your health care provider. Make sure you discuss any questions you have with your health care provider. Document Revised: 05/10/2017 Document Reviewed: 05/10/2017 Elsevier Patient Education  2020 [employer].

## 2019-05-25 ENCOUNTER — Encounter: Payer: Self-pay | Admitting: Gastroenterology

## 2019-05-25 LAB — ANA: Anti Nuclear Antibody (ANA): NEGATIVE

## 2019-05-25 LAB — ANTI-SMOOTH MUSCLE ANTIBODY, IGG: F-Actin IgG: 5 Units (ref 0–19)

## 2019-05-25 LAB — MITOCHONDRIAL ANTIBODIES: Mitochondrial M2 Ab, IgG: <20 Units (ref 0.0–20.0)

## 2019-05-25 NOTE — Assessment & Plan Note (Signed)
Addressed under diarrhea 

## 2019-05-25 NOTE — Assessment & Plan Note (Addendum)
Symptoms improved/resolved s/p EGD/ED on 04/08/2019 which revealed erosive reflux esophagitis.  GERD symptoms are now well controlled on Dexilant daily.  Continue to monitor for recurrence of dysphagia symptoms.   Continue Dexilant 60 mg daily. Follow-up in 3 months for lower GI symptoms as discussed below.

## 2019-05-25 NOTE — Assessment & Plan Note (Addendum)
Much improved on Dexilant 60 mg daily.  Rare breakthrough symptoms.  Continue Dexilant 60 mg daily. Follow-up in 3 months for lower GI symptoms as discussed below.

## 2019-05-25 NOTE — Assessment & Plan Note (Addendum)
Patient has history of postprandial loose watery BMs daily with associated urgency and abdominal cramping prior to BMs that resolves thereafter.  At her last visit, which was her initial consult, in October 2020, I prescribed Bentyl for suspected IBS or bile salt diarrhea as she is s/p cholecystectomy.  Patient never use Bentyl due to concerns about potential side effect of constipation.  Unfortunately, she has had acute onset of worsening diarrhea with hematochezia, persistent left-sided abdominal pain, and vomiting secondary to severity of pain about 1.5-2 weeks ago.  She was seen in the ED on 05/13/2019 and diagnosed with UTI.  Presented again on 05/19/2019.  Labs remarkable for WBC 16H.  Otherwise CBC, CMP, lipase nonrevealing.  Covid negative.  CT abdomen and pelvis without acute abnormalities.  Received Rocephin from PCP on 05/20/2019 for empiric treatment of possible abdominal infection.  Since then, she has had mild improvement.  Continues with abdominal pain but stool consistency improved to mushy yesterday and she has not had a BM thus far today. Rectal bleeding resolved 2 days ago. Of note, mom and sister had acute illness with diarrhea and vomiting prior to patient's onset of symptoms.  Denies antibiotics prior to onset of symptoms, travel, well water, livestock contact, consumption of undercooked meat. Mild to moderate tenderness to palpation of the left abdomen, greatest in the left upper quadrant.   Suspect patient likely had acute infectious gastroenteritis that resulted in postinfectious IBS vs flare of underlying IBS.  As symptoms are improving, we will update CBC to ensure WBC is trending down and hemoglobin is remaining stable.   I am ordering stool studies. She will complete these if her diarrhea returns.  Start Bentyl 10 mg up to 3 times daily before meals and at bedtime as needed to help with abdominal cramping. Hold in setting of constipation.  Refilling Zofran for nausea.   Follow a  bland diet for now.   She will need a colonoscopy at some point in the future to evaluate rectal bleeding. Suspect this was related to GI illness, but her colon will need to be evaluated. We will discuss this at follow-up.  Follow-up in 3 months. Call in 1-2 weeks with a progress report.

## 2019-05-25 NOTE — Assessment & Plan Note (Signed)
28-year-old female with mildly elevated LFTs without identified cause.  Historically, she has had slight LFT elevation in 2029 with AST 61, ALT 52, alk phos 123.  The normalized in 2012 and remained normal until July 2020 with ALT 61H.  Work-up thus far has included abdominal ultrasound on 02/26/2019 which was unremarkable.  Of note, she had ultrasound in January 2019 with mildly echogenic liver suggesting hepatic steatosis.  Hepatitis A, B, and C negative.  Iron panel within normal limits.  INR normal.  Most recent labs on 05/19/2019 with ALT 64H.  She is without signs or symptoms of advanced liver disease.  No regular Tylenol use, OTC supplements, or alcohol use.  She is down to 10 pounds since I saw her last.  This is partially influenced by acute GI illness discussed below.  At this point, as her ALT has remained elevated without significant change, will go ahead and complete additional laboratory workup to evaluate autoimmune causes, alpha-1 antitrypsin deficiency, Wilson's disease.  As her BMI is 36 and she has history of HTN, this may also be influencing her elevated ALT.  It is possible the ultrasound would not pick up mild fatty liver.  Complete labs including ANA, AMA, ASMA, immunoglobulins, ceruloplasmin, alpha-1 antitrypsin phenotype, hepatitis B surface antibody. Further recommendations to follow. Continue to limit Tylenol and avoid over-the-counter supplements. Continue to avoid alcohol. Plan to follow-up in 3 months.   

## 2019-05-26 LAB — CERULOPLASMIN: Ceruloplasmin: 82.3 mg/dL — ABNORMAL HIGH (ref 19.0–39.0)

## 2019-05-27 LAB — IGG, IGA, IGM
IgA: 217 mg/dL (ref 87–352)
IgG (Immunoglobin G), Serum: 1086 mg/dL (ref 586–1602)
IgM (Immunoglobulin M), Srm: 85 mg/dL (ref 26–217)

## 2019-05-27 NOTE — Progress Notes (Signed)
CBC within normal limits. Specifically, her WBC has come back down to 9.9 from 16.0. Again, I suspect she was dealing with an acute gastroenteritis as the cause of her diarrhea, abdominal pain, and vomiting.   Labs for elevated LFTs: Autoimmune labs (immunoglobulins, ANA, AMA, ASMA) within normal limits. Ceruloplasmin is elevated. This is non-specific and we will need to complete 24 hour urine copper to evaluate further. This is to evaluate for Wilson's Disease (condition where excess copper builds up in the body). Other possible causes of her elevated ceruloplasmin include inflammatory response or her birth control.   Please arrange or 24 hour urine copper.  Additionally, the hospital keeps drawing Hepatitis B surface Ag and I am placing an order or Hep B surface Ab. Can you see if they can check the surface Ab with the blood they already have?

## 2019-05-28 ENCOUNTER — Other Ambulatory Visit: Payer: Self-pay

## 2019-05-28 DIAGNOSIS — Z79899 Other long term (current) drug therapy: Secondary | ICD-10-CM

## 2019-05-28 DIAGNOSIS — Z1159 Encounter for screening for other viral diseases: Secondary | ICD-10-CM

## 2019-05-28 DIAGNOSIS — R7989 Other specified abnormal findings of blood chemistry: Secondary | ICD-10-CM

## 2019-05-28 LAB — ALPHA-1 ANTITRYPSIN PHENOTYPE: A-1 Antitrypsin, Ser: 193 mg/dL — ABNORMAL HIGH (ref 100–188)

## 2019-06-05 ENCOUNTER — Telehealth: Payer: Self-pay | Admitting: Internal Medicine

## 2019-06-05 ENCOUNTER — Other Ambulatory Visit (HOSPITAL_COMMUNITY)
Admission: AD | Admit: 2019-06-05 | Discharge: 2019-06-05 | Disposition: A | Discharge status: Home / Self Care | Payer: Medicaid Other | Source: Skilled Nursing Facility | Attending: Gastroenterology | Admitting: Gastroenterology

## 2019-06-05 DIAGNOSIS — Z1159 Encounter for screening for other viral diseases: Secondary | ICD-10-CM | POA: Diagnosis present

## 2019-06-05 NOTE — Telephone Encounter (Signed)
Pt said she dropped off her 24 hr urine to the lab. She said if [facility] wanted her to have any blood work done that is refuses to go back to the lab at [facility] and to send her somewhere else. Please advise [phone]

## 2019-06-06 LAB — COPPER, URINE - RANDOM OR 24 HOUR
Copper / Creatinine Ratio: 8 ug/g creat (ref 0–49)
Copper, 24H Ur: 8 ug/24 hr (ref 3–35)
Copper, Ur: 7 ug/L
Creatinine(Crt),U: 0.87 g/L (ref 0.30–3.00)
Total Volume: 1150

## 2019-06-06 NOTE — Telephone Encounter (Signed)
FYI, Spoke with pt. Pt dropped off her 24 hour urine test at [facility] lab but is refusing to get labs there due to being stuck 6 times and states that she was talked to the wrong way. Pt is aware that she can go have the blood work completed at [facility] lab across the street from [facility]. Pt is aware that orders were previously placed and will have labs done.

## 2019-06-06 NOTE — Telephone Encounter (Signed)
Noted  

## 2019-06-07 NOTE — Telephone Encounter (Signed)
Left a detailed message with recommendations and f/u apt. Pt can call back with any questions or concerns.

## 2019-06-07 NOTE — Telephone Encounter (Signed)
Reviewed 24 hour urine copper. Result within normal limits. We will continue to monitor for now and follow-up in April as planned. She should continue to work on weight loss (1-2# weight loss per week until ideal body weight through exercise & diet), low fat/cholesterol diet, avoid sweets, sodas, fruit juices, sweetened beverages like tea, etc. Gradually increase exercise from 15 min daily up to 1 hr per day 5 days/week. Limit alcohol use. Limit tylenol. Avoid all OTC supplements.

## 2019-06-12 ENCOUNTER — Telehealth: Payer: Medicaid Other | Admitting: Cardiovascular Disease

## 2019-06-13 ENCOUNTER — Telehealth: Payer: Self-pay | Admitting: Internal Medicine

## 2019-06-13 NOTE — Telephone Encounter (Signed)
Pt called me this afternoon.  Recurrent bloody diarrhea this week.  GI sx all cleared up 3 weeks ago - Was given abx previously for presumed enteric infection.  States she never completed stool studies dueto being treated and improvement in sx.  No vomiting or fever. I recommend TCS Monday, Feb 22 - propofol.  Need STAT BMET and CBC tomorrow morning.  STaff to arrange blood work, colon prep, scheduling I told pt to stop all Ibuprofen, dicylcomine.  Maintain hydration.  To the ED if sx become worse. I told pt staff would call in am and set everything up.

## 2019-06-14 ENCOUNTER — Other Ambulatory Visit: Payer: Self-pay | Admitting: Internal Medicine

## 2019-06-14 ENCOUNTER — Encounter: Payer: Self-pay | Admitting: Cardiovascular Disease

## 2019-06-14 ENCOUNTER — Telehealth (INDEPENDENT_AMBULATORY_CARE_PROVIDER_SITE_OTHER): Payer: Medicaid Other | Admitting: Cardiovascular Disease

## 2019-06-14 ENCOUNTER — Other Ambulatory Visit: Payer: Self-pay

## 2019-06-14 VITALS — Ht 66.5 in | Wt 230.0 lb

## 2019-06-14 DIAGNOSIS — R000 Tachycardia, unspecified: Secondary | ICD-10-CM

## 2019-06-14 DIAGNOSIS — K921 Melena: Secondary | ICD-10-CM | POA: Diagnosis not present

## 2019-06-14 DIAGNOSIS — R1084 Generalized abdominal pain: Secondary | ICD-10-CM

## 2019-06-14 DIAGNOSIS — R002 Palpitations: Secondary | ICD-10-CM

## 2019-06-14 DIAGNOSIS — R197 Diarrhea, unspecified: Secondary | ICD-10-CM

## 2019-06-14 DIAGNOSIS — K625 Hemorrhage of anus and rectum: Secondary | ICD-10-CM

## 2019-06-14 DIAGNOSIS — R109 Unspecified abdominal pain: Secondary | ICD-10-CM | POA: Diagnosis not present

## 2019-06-14 LAB — CBC WITH DIFFERENTIAL/PLATELET
Absolute Monocytes: 621 cells/uL (ref 200–950)
Basophils Absolute: 44 cells/uL (ref 0–200)
Basophils Relative: 0.4 %
Eosinophils Absolute: 153 cells/uL (ref 15–500)
Eosinophils Relative: 1.4 %
HCT: 45.3 % — ABNORMAL HIGH (ref 35.0–45.0)
Hemoglobin: 15.4 g/dL (ref 11.7–15.5)
Lymphs Abs: 2202 cells/uL (ref 850–3900)
MCH: 28.8 pg (ref 27.0–33.0)
MCHC: 34 g/dL (ref 32.0–36.0)
MCV: 84.7 fL (ref 80.0–100.0)
MPV: 10.5 fL (ref 7.5–12.5)
Monocytes Relative: 5.7 %
Neutro Abs: 7881 cells/uL — ABNORMAL HIGH (ref 1500–7800)
Neutrophils Relative %: 72.3 %
Platelets: 375 10*3/uL (ref 140–400)
RBC: 5.35 10*6/uL — ABNORMAL HIGH (ref 3.80–5.10)
RDW: 13.6 % (ref 11.0–15.0)
Total Lymphocyte: 20.2 %
WBC: 10.9 10*3/uL — ABNORMAL HIGH (ref 3.8–10.8)

## 2019-06-14 LAB — BASIC METABOLIC PANEL
BUN: 9 mg/dL (ref 7–25)
CO2: 21 mmol/L (ref 20–32)
Calcium: 9.9 mg/dL (ref 8.6–10.2)
Chloride: 104 mmol/L (ref 98–110)
Creat: 1.01 mg/dL (ref 0.50–1.10)
Glucose, Bld: 86 mg/dL (ref 65–139)
Potassium: 4.1 mmol/L (ref 3.5–5.3)
Sodium: 138 mmol/L (ref 135–146)

## 2019-06-14 NOTE — Telephone Encounter (Signed)
Stat lab orders done. Called pt- [facility] and [facility] this morning. Pt stated she knew where the lab was. Advised pt that tcs will be on Wednesday and we would call her Monday to get her instructions. If she hasnt heard from [place] by noon she will call the office.

## 2019-06-14 NOTE — Telephone Encounter (Signed)
Called the lab to make sure they had the stat orders. Dana Mcpherson with customer service said yes they could see the stat orders and they recommend that the pt call or go online to schedule appointment due to they could not guarantee she will be done if she just walks in. It depends on how many patients they have in their office at that time. I called the pt back, explained to her that they asked she make an appt and gave her the phone number and the website and asked her to get asap appt with the lab for her bloodwork. Pt stated she would.

## 2019-06-14 NOTE — Patient Instructions (Addendum)
Medication Instructions:   Your physician recommends that you continue on your current medications as directed. Please refer to the Current Medication list given to you today.  Labwork:  NONE  Testing/Procedures:  NONE  Follow-Up:  Your physician recommends that you schedule a follow-up appointment in: 3 months (virtual).  Any Other Special Instructions Will Be Listed Below (If Applicable).  If you need a refill on your cardiac medications before your next appointment, please call your pharmacy.

## 2019-06-14 NOTE — Telephone Encounter (Signed)
I spoke with Dr. Gala Romney and he is agreeable with the patient having procedure done on Wednesday on next week and to have labs done today.  The schedule is full for Monday.

## 2019-06-14 NOTE — Telephone Encounter (Signed)
Communications noted.  I appreciate the great work from my office leaders this morning on this patient even though the office is closed due to bad weather!

## 2019-06-14 NOTE — Telephone Encounter (Signed)
Per Threasa Beards the patient can be scheduled for Wednesday at 7:30 in the OR.  Routing to [place] to schedule and give instructions to patient.

## 2019-06-14 NOTE — Progress Notes (Signed)
Virtual Visit via Telephone Note   This visit type was conducted due to national recommendations for restrictions regarding the COVID-19 Pandemic (e.g. social distancing) in an effort to limit this patient's exposure and mitigate transmission in our community.  Due to her co-morbid illnesses, this patient is at least at moderate risk for complications without adequate follow up.  This format is felt to be most appropriate for this patient at this time.  The patient did not have access to video technology/had technical difficulties with video requiring transitioning to audio format only (telephone).  All issues noted in this document were discussed and addressed.  No physical exam could be performed with this format.  Please refer to the patient's chart for her  consent to telehealth for [facility].   Date:  06/14/2019   ID:  Dana Mcpherson, DOB Sep 18, 1990, MRN [id number]  Patient Location: Home Provider Location: Home  PCP:  Sharilyn Sites, MD  Cardiologist:  Kate Sable, MD  Electrophysiologist:  None   Evaluation Performed:  Follow-Up Visit  Chief Complaint:  palpitations/inappropriate sinus tachycardia  History of Present Illness:    Dana Mcpherson is a 29 y.o. female with a history of palpitations and inappropriate sinus tachycardia.  She takes ivabradine. She also has polycystic ovarian syndrome and GERD.  Echocardiogram in May 2018 demonstrated normal left ventricular systolic function, LVEF 60 to 65%.  Her problems with tachycardia began after she was pregnant with her son, Max, in 2016.  She has felt improvement on ivabradine 7.5 mg twice daily.   She also follows with GI.  She is getting bloodwork done for GI. She is undergoing "an emergency colonoscopy" this Wednesday.  She's been having abdominal pain. She's had blood in her stool.  CT abdomen on 05/19/19 showed no acute abnormalities.  Her HR's have been higher since she's not been feeling well.      Past Medical History:  Diagnosis Date  . Allergy   . Anxiety   . Asthma   . Complication of anesthesia    woke up during surgery & ithing after surgery  . Family history of adverse reaction to anesthesia    "My mom has the same trouble with anesthesia".  . GERD (gastroesophageal reflux disease)   . Migraine headache   . Nexplanon in place 10/05/2015   06/29/15 left arm  . Polycystic ovarian syndrome   . Pregnancy induced hypertension   . Tachycardia   . Vaginal Pap smear, abnormal    Past Surgical History:  Procedure Laterality Date  . CHOLECYSTECTOMY  2010  . ESOPHAGOGASTRODUODENOSCOPY    . ESOPHAGOGASTRODUODENOSCOPY (EGD) WITH PROPOFOL N/A 04/08/2019   Procedure: ESOPHAGOGASTRODUODENOSCOPY (EGD) WITH PROPOFOL;  Surgeon: Daneil Dolin, MD;  Location: [facility];  Service: Endoscopy;  Laterality: N/A;  10:45am  . MALONEY DILATION N/A 04/08/2019   Procedure: Venia Minks DILATION;  Surgeon: Daneil Dolin, MD;  Location: [facility];  Service: Endoscopy;  Laterality: N/A;     Current Meds  Medication Sig  . CORLANOR 5 MG TABS tablet TAKE 1 & 1/2 TABLETS BY MOUTH 2 TIMES A DAY WITH A MEAL. (Patient taking differently: Take 7.5 mg by mouth 2 (two) times daily. )  . dexlansoprazole (DEXILANT) 60 MG capsule Take 1 capsule (60 mg total) by mouth daily.  . Flaxseed, Linseed, (FLAXSEED OIL) 1200 MG CAPS Take 1,200 mg by mouth 2 (two) times daily.  . INTROVALE 0.15-0.03 MG tablet TAKE 1T BY MOUTH EVERY DAY (Patient taking differently: Take  1 tablet by mouth at bedtime. )  . ondansetron (ZOFRAN) 4 MG tablet Take 1 tablet (4 mg total) by mouth every 8 (eight) hours as needed for nausea or vomiting.  . Probiotic Product (RA PROBIOTIC GUMMIES) CHEW Chew 1 each by mouth in the morning and at bedtime.  . topiramate (TOPAMAX) 25 MG capsule Take 50 mg by mouth 2 (two) times daily.     Allergies:   Other, Shellfish allergy, Adhesive [tape], Eggs or egg-derived products, and Latex    Social History   Tobacco Use  . Smoking status: Former Smoker    Packs/day: 0.25    Years: 2.00    Pack years: 0.50    Types: Cigarettes    Quit date: 04/04/2010    Years since quitting: 9.2  . Smokeless tobacco: Never Used  Substance Use Topics  . Alcohol use: No  . Drug use: No     Family Hx: The patient's family history includes ADD / ADHD in her son; Asthma in her son; Cancer in her paternal grandmother; Colon cancer in an other family member.  ROS:   Please see the history of present illness.     All other systems reviewed and are negative.   Prior CV studies:   The following studies were reviewed today:  Reviewed above  Labs/Other Tests and Data Reviewed:    EKG:  Sinus rhythm on 04/05/19 (personally reviewed)  Recent Labs: 10/25/2018: TSH 2.384 05/19/2019: ALT 64; BUN 9; Creatinine, Ser 0.88; Potassium 3.6; Sodium 137 05/24/2019: Hemoglobin 14.3; Platelets 384   Recent Lipid Panel No results found for: CHOL, TRIG, HDL, CHOLHDL, LDLCALC, LDLDIRECT  Wt Readings from Last 3 Encounters:  06/14/19 230 lb (104.3 kg)  05/24/19 231 lb 9.6 oz (105.1 kg)  05/19/19 238 lb (108 kg)     Objective:    Vital Signs:  Ht 5' 6.5" (1.689 m)   Wt 230 lb (104.3 kg)   BMI 36.57 kg/m    VITAL SIGNS:  reviewed  ASSESSMENT & PLAN:    1.  Tachycardia and palpitations/inappropriate sinus tachycardia: Continue ivabradine 7.5 mg twice daily.  She did not tolerate metoprolol as it led to hypotension. HR's have been elevated since she's not been feeling well with respect to GI issues.  2. Abdominal pain and hematochezia: She is scheduled to undergo a colonoscopy next week. As per GI notes, there is suspicion for postinfectious IBS or a flare. WBC's 16 on 05/19/19, 9.9 on 1/29.    COVID-19 Education: The signs and symptoms of COVID-19 were discussed with the patient and how to seek care for testing (follow up with PCP or arrange E-visit).  The importance of social distancing  was discussed today.  Time:   Today, I have spent 15 minutes with the patient with telehealth technology discussing the above problems.     Medication Adjustments/Labs and Tests Ordered: Current medicines are reviewed at length with the patient today.  Concerns regarding medicines are outlined above.   Tests Ordered: No orders of the defined types were placed in this encounter.   Medication Changes: No orders of the defined types were placed in this encounter.   Follow Up:  Virtual Visit  3 months  Signed, Kate Sable, MD  06/14/2019 11:18 AM    [place]

## 2019-06-17 ENCOUNTER — Other Ambulatory Visit: Payer: Self-pay

## 2019-06-17 ENCOUNTER — Other Ambulatory Visit (HOSPITAL_COMMUNITY)
Admission: RE | Admit: 2019-06-17 | Discharge: 2019-06-17 | Disposition: A | Discharge status: Home / Self Care | Payer: Medicaid Other | Source: Ambulatory Visit | Attending: Internal Medicine | Admitting: Internal Medicine

## 2019-06-17 DIAGNOSIS — Z20822 Contact with and (suspected) exposure to covid-19: Secondary | ICD-10-CM | POA: Diagnosis not present

## 2019-06-17 DIAGNOSIS — Z01812 Encounter for preprocedural laboratory examination: Secondary | ICD-10-CM | POA: Insufficient documentation

## 2019-06-17 LAB — SARS CORONAVIRUS 2 (TAT 6-24 HRS): SARS Coronavirus 2: NEGATIVE

## 2019-06-17 MED ORDER — CLENPIQ 10-3.5-12 MG-GM -GM/160ML PO SOLN: 1.0000 | Freq: Once | ORAL | 0 refills | Status: AC

## 2019-06-17 NOTE — Telephone Encounter (Signed)
Orders entered for TCS 06/19/19.  Per endo scheduler, pt will need pre-op phone call tomorrow since she had recent lab work. COVID test today at 1:00pm. Rx for prep sent to pharmacy. Instructions faxed to pharmacy.  Called pt, advised her to check with pharmacy to make sure prep is in stock. She is aware instructions were faxed. She will for COVID test today.

## 2019-06-17 NOTE — Telephone Encounter (Signed)
Noted  

## 2019-06-17 NOTE — Telephone Encounter (Signed)
Communication noted.  

## 2019-06-18 ENCOUNTER — Encounter (HOSPITAL_COMMUNITY)
Admission: RE | Admit: 2019-06-18 | Discharge: 2019-06-18 | Disposition: A | Discharge status: Home / Self Care | Payer: Medicaid Other | Source: Ambulatory Visit | Attending: Internal Medicine | Admitting: Internal Medicine

## 2019-06-18 ENCOUNTER — Other Ambulatory Visit: Payer: Self-pay

## 2019-06-19 ENCOUNTER — Ambulatory Visit (HOSPITAL_COMMUNITY)
Admission: RE | Admit: 2019-06-19 | Discharge: 2019-06-19 | Disposition: A | Discharge status: Home / Self Care | Payer: Medicaid Other | Attending: Internal Medicine | Admitting: Internal Medicine

## 2019-06-19 ENCOUNTER — Encounter (HOSPITAL_COMMUNITY)
Admission: RE | Disposition: A | Discharge status: Home / Self Care | Payer: Self-pay | Source: Home / Self Care | Attending: Internal Medicine

## 2019-06-19 ENCOUNTER — Ambulatory Visit (HOSPITAL_COMMUNITY): Payer: Medicaid Other | Admitting: Anesthesiology

## 2019-06-19 DIAGNOSIS — D120 Benign neoplasm of cecum: Secondary | ICD-10-CM | POA: Insufficient documentation

## 2019-06-19 DIAGNOSIS — E282 Polycystic ovarian syndrome: Secondary | ICD-10-CM | POA: Insufficient documentation

## 2019-06-19 DIAGNOSIS — Z91012 Allergy to eggs: Secondary | ICD-10-CM | POA: Diagnosis not present

## 2019-06-19 DIAGNOSIS — K635 Polyp of colon: Secondary | ICD-10-CM

## 2019-06-19 DIAGNOSIS — Z8041 Family history of malignant neoplasm of ovary: Secondary | ICD-10-CM | POA: Diagnosis not present

## 2019-06-19 DIAGNOSIS — Z91040 Latex allergy status: Secondary | ICD-10-CM | POA: Insufficient documentation

## 2019-06-19 DIAGNOSIS — Z9049 Acquired absence of other specified parts of digestive tract: Secondary | ICD-10-CM | POA: Diagnosis not present

## 2019-06-19 DIAGNOSIS — R0602 Shortness of breath: Secondary | ICD-10-CM | POA: Diagnosis not present

## 2019-06-19 DIAGNOSIS — R1310 Dysphagia, unspecified: Secondary | ICD-10-CM | POA: Diagnosis not present

## 2019-06-19 DIAGNOSIS — Z91048 Other nonmedicinal substance allergy status: Secondary | ICD-10-CM | POA: Diagnosis not present

## 2019-06-19 DIAGNOSIS — G43909 Migraine, unspecified, not intractable, without status migrainosus: Secondary | ICD-10-CM | POA: Insufficient documentation

## 2019-06-19 DIAGNOSIS — Z79899 Other long term (current) drug therapy: Secondary | ICD-10-CM | POA: Diagnosis not present

## 2019-06-19 DIAGNOSIS — K2100 Gastro-esophageal reflux disease with esophagitis, without bleeding: Secondary | ICD-10-CM | POA: Diagnosis not present

## 2019-06-19 DIAGNOSIS — K921 Melena: Secondary | ICD-10-CM

## 2019-06-19 DIAGNOSIS — K529 Noninfective gastroenteritis and colitis, unspecified: Secondary | ICD-10-CM | POA: Insufficient documentation

## 2019-06-19 DIAGNOSIS — Z87891 Personal history of nicotine dependence: Secondary | ICD-10-CM | POA: Diagnosis not present

## 2019-06-19 DIAGNOSIS — Z791 Long term (current) use of non-steroidal anti-inflammatories (NSAID): Secondary | ICD-10-CM | POA: Insufficient documentation

## 2019-06-19 DIAGNOSIS — F419 Anxiety disorder, unspecified: Secondary | ICD-10-CM | POA: Insufficient documentation

## 2019-06-19 DIAGNOSIS — I10 Essential (primary) hypertension: Secondary | ICD-10-CM | POA: Insufficient documentation

## 2019-06-19 DIAGNOSIS — Z800 Family history of malignant neoplasm of digestive organs: Secondary | ICD-10-CM | POA: Diagnosis not present

## 2019-06-19 DIAGNOSIS — Z20822 Contact with and (suspected) exposure to covid-19: Secondary | ICD-10-CM | POA: Insufficient documentation

## 2019-06-19 DIAGNOSIS — R0609 Other forms of dyspnea: Secondary | ICD-10-CM | POA: Diagnosis not present

## 2019-06-19 DIAGNOSIS — J45909 Unspecified asthma, uncomplicated: Secondary | ICD-10-CM | POA: Diagnosis not present

## 2019-06-19 DIAGNOSIS — R7989 Other specified abnormal findings of blood chemistry: Secondary | ICD-10-CM | POA: Diagnosis not present

## 2019-06-19 DIAGNOSIS — Z91013 Allergy to seafood: Secondary | ICD-10-CM | POA: Diagnosis not present

## 2019-06-19 DIAGNOSIS — Z818 Family history of other mental and behavioral disorders: Secondary | ICD-10-CM | POA: Insufficient documentation

## 2019-06-19 DIAGNOSIS — Z825 Family history of asthma and other chronic lower respiratory diseases: Secondary | ICD-10-CM | POA: Insufficient documentation

## 2019-06-19 HISTORY — PX: POLYPECTOMY: SHX5525

## 2019-06-19 HISTORY — PX: COLONOSCOPY WITH PROPOFOL: SHX5780

## 2019-06-19 HISTORY — PX: BIOPSY: SHX5522

## 2019-06-19 LAB — C DIFFICILE QUICK SCREEN W PCR REFLEX
C Diff antigen: NEGATIVE
C Diff interpretation: NOT DETECTED
C Diff toxin: NEGATIVE

## 2019-06-19 LAB — PREGNANCY, URINE: Preg Test, Ur: NEGATIVE

## 2019-06-19 SURGERY — COLONOSCOPY WITH PROPOFOL
Anesthesia: General

## 2019-06-19 MED ORDER — STERILE WATER FOR IRRIGATION IR SOLN
Status: DC | PRN
  Administered 2019-06-19: 100 mL

## 2019-06-19 MED ORDER — LACTATED RINGERS IV SOLN: INTRAVENOUS | Status: DC | PRN

## 2019-06-19 MED ORDER — KETAMINE HCL 10 MG/ML IJ SOLN
INTRAMUSCULAR | Status: DC | PRN
  Administered 2019-06-19: 20 mg via INTRAVENOUS

## 2019-06-19 MED ORDER — KETAMINE HCL 50 MG/5ML IJ SOSY
PREFILLED_SYRINGE | INTRAMUSCULAR | Status: AC
  Filled 2019-06-19: qty 5

## 2019-06-19 MED ORDER — ONDANSETRON HCL 4 MG/2ML IJ SOLN
INTRAMUSCULAR | Status: AC
  Filled 2019-06-19: qty 6

## 2019-06-19 MED ORDER — HYDROCODONE-ACETAMINOPHEN 7.5-325 MG PO TABS: 1.0000 | ORAL_TABLET | Freq: Once | ORAL | Status: DC | PRN

## 2019-06-19 MED ORDER — HYDROMORPHONE HCL 1 MG/ML IJ SOLN: 0.2500 mg | INTRAMUSCULAR | Status: DC | PRN

## 2019-06-19 MED ORDER — PROMETHAZINE HCL 25 MG/ML IJ SOLN: 6.2500 mg | INTRAMUSCULAR | Status: DC | PRN

## 2019-06-19 MED ORDER — MIDAZOLAM HCL 2 MG/2ML IJ SOLN: 0.5000 mg | Freq: Once | INTRAMUSCULAR | Status: DC | PRN

## 2019-06-19 MED ORDER — PROPOFOL 500 MG/50ML IV EMUL
INTRAVENOUS | Status: DC | PRN
  Administered 2019-06-19: 150 ug/kg/min via INTRAVENOUS
  Administered 2019-06-19: 200 ug/kg/min via INTRAVENOUS

## 2019-06-19 MED ORDER — LIDOCAINE 2% (20 MG/ML) 5 ML SYRINGE
INTRAMUSCULAR | Status: AC
  Filled 2019-06-19: qty 5

## 2019-06-19 MED ORDER — PROPOFOL 10 MG/ML IV BOLUS
INTRAVENOUS | Status: DC | PRN
  Administered 2019-06-19: 40 mg via INTRAVENOUS

## 2019-06-19 MED ORDER — PROPOFOL 10 MG/ML IV BOLUS
INTRAVENOUS | Status: AC
  Filled 2019-06-19: qty 40

## 2019-06-19 MED ORDER — LIDOCAINE HCL (CARDIAC) PF 50 MG/5ML IV SOSY
PREFILLED_SYRINGE | INTRAVENOUS | Status: DC | PRN
  Administered 2019-06-19: 80 mg via INTRAVENOUS

## 2019-06-19 MED ORDER — ONDANSETRON HCL 4 MG/2ML IJ SOLN
INTRAMUSCULAR | Status: DC | PRN
  Administered 2019-06-19: 4 mg via INTRAVENOUS

## 2019-06-19 NOTE — Interval H&P Note (Signed)
History and Physical Interval Note:  06/19/2019 7:37 AM  Dana Mcpherson  has presented today for surgery, with the diagnosis of bloody diarrhea.  The various methods of treatment have been discussed with the patient and family. After consideration of risks, benefits and other options for treatment, the patient has consented to  Procedure(s) with comments: COLONOSCOPY WITH PROPOFOL (N/A) - 7:30am as a surgical intervention.  The patient's history has been reviewed, patient examined, no change in status, stable for surgery.  I have reviewed the patient's chart and labs.  Questions were answered to the patient's satisfaction.     Manus Rudd     Patient with abdominal cramps and bloody diarrhea.  Colonoscopy today per plan.  The risks, benefits, limitations, alternatives and imponderables have been reviewed with the patient. Questions have been answered. All parties are agreeable.

## 2019-06-19 NOTE — Discharge Instructions (Signed)
  Colonoscopy Discharge Instructions  Read the instructions outlined below and refer to this sheet in the next few weeks. These discharge instructions provide you with general information on caring for yourself after you leave the hospital. Your doctor may also give you specific instructions. While your treatment has been planned according to the most current medical practices available, unavoidable complications occasionally occur. If you have any problems or questions after discharge, call Dr. Gala Romney at [phone]. ACTIVITY  You may resume your regular activity, but move at a slower pace for the next 24 hours.   Take frequent rest periods for the next 24 hours.   Walking will help get rid of the air and reduce the bloated feeling in your belly (abdomen).   No driving for 24 hours (because of the medicine (anesthesia) used during the test).    Do not sign any important legal documents or operate any machinery for 24 hours (because of the anesthesia used during the test).  NUTRITION  Drink plenty of fluids.   You may resume your normal diet as instructed by your doctor.   Begin with a light meal and progress to your normal diet. Heavy or fried foods are harder to digest and may make you feel sick to your stomach (nauseated).   Avoid alcoholic beverages for 24 hours or as instructed.  MEDICATIONS  You may resume your normal medications unless your doctor tells you otherwise.  WHAT YOU CAN EXPECT TODAY  Some feelings of bloating in the abdomen.   Passage of more gas than usual.   Spotting of blood in your stool or on the toilet paper.  IF YOU HAD POLYPS REMOVED DURING THE COLONOSCOPY:  No aspirin products for 7 days or as instructed.   No alcohol for 7 days or as instructed.   Eat a soft diet for the next 24 hours.  FINDING OUT THE RESULTS OF YOUR TEST Not all test results are available during your visit. If your test results are not back during the visit, make an appointment  with your caregiver to find out the results. Do not assume everything is normal if you have not heard from your caregiver or the medical facility. It is important for you to follow up on all of your test results.  SEEK IMMEDIATE MEDICAL ATTENTION IF:  You have more than a spotting of blood in your stool.   Your belly is swollen (abdominal distention).   You are nauseated or vomiting.   You have a temperature over 101.   You have abdominal pain or discomfort that is severe or gets worse throughout the day.   You had 1 polyp removed from your colon.  Otherwise, your colon looked good.  I took additional samples and another stool specimen to be checked  Further recommendations to follow once lab results are available for review  Office visit with [place] in 2 weeks  At patient request, I spoke to Woodroe Chen at [phone] regarding results

## 2019-06-19 NOTE — Op Note (Signed)
[facility] Patient Name: Dana Mcpherson Procedure Date: 06/19/2019 7:07 AM MRN: [id number] Date of Birth: 07/16/1990 Attending MD: Norvel Richards , MD CSN: [id number] Age: 29 Admit Type: Outpatient Procedure:                Colonoscopy Indications:              Chronic diarrhea, Hematochezia Providers:                Norvel Richards, MD, Rosina Lowenstein, RN, Aram Candela Referring MD:              Medicines:                Propofol per Anesthesia Complications:            No immediate complications. Estimated Blood Loss:     Estimated blood loss was minimal. Procedure:                Pre-Anesthesia Assessment:                           - Prior to the procedure, a History and Physical                            was performed, and patient medications and                            allergies were reviewed. The patient's tolerance of                            previous anesthesia was also reviewed. The risks                            and benefits of the procedure and the sedation                            options and risks were discussed with the patient.                            All questions were answered, and informed consent                            was obtained. Prior Anticoagulants: The patient has                            taken no previous anticoagulant or antiplatelet                            agents. ASA Grade Assessment: II - A patient with                            mild systemic disease. After reviewing the risks  and benefits, the patient was deemed in                            satisfactory condition to undergo the procedure.                           After obtaining informed consent, the colonoscope                            was passed under direct vision. Throughout the                            procedure, the patient's blood pressure, pulse, and                            oxygen saturations were  monitored continuously. The                            [id number]) scope was introduced through                            the anus and advanced to the 10 cm into the ileum.                            The colonoscopy was performed without difficulty.                            The patient tolerated the procedure well. The                            quality of the bowel preparation was adequate. Scope In: 7:52:02 AM Scope Out: 8:08:02 AM Scope Withdrawal Time: 0 hours 12 minutes 42 seconds  Total Procedure Duration: 0 hours 16 minutes 0 seconds  Findings:      The perianal and digital rectal examinations were normal.      A 5 mm polyp was found in the cecum. The polyp was sessile. The polyp       was removed with a cold snare. Resection and retrieval were complete.       The remainder the colonic mucosa appeared normal. Normal rectum on       retroflexion. Segmental biopsies of the right and left colon taken.       Stool sample submitted for C. difficile testing distal 10 cm of TI       appeared normal. Impression:               - One 5 mm polyp in the cecum, removed with a cold                            snare. Resected and retrieved. Manger of rectum,                            colon and TI appeared normal. Status post segmental  biopsy and stool sampling                           Need to rule out C. difficile. All symptoms could                            be due to postinfectious IBS or exacerbation of                            IBS. I suspect trivial anorectal bleeding with                            altered bowel function. Moderate Sedation:      Moderate (conscious) sedation was personally administered by an       anesthesia professional. The following parameters were monitored: oxygen       saturation, heart rate, blood pressure, respiratory rate, EKG, adequacy       of pulmonary ventilation, and response to care. Recommendation:           -  Patient has a contact number available for                            emergencies. The signs and symptoms of potential                            delayed complications were discussed with the                            patient. Return to normal activities tomorrow.                            Written discharge instructions were provided to the                            patient.                           - Advance diet as tolerated. Office visit with [place]                            in 2 weeks. Further recommendations to follow                            pending review of laboratory results. Procedure Code(s):        --- Professional ---                           [phone], Colonoscopy, flexible; with removal of                            tumor(s), polyp(s), or other lesion(s) by snare                            technique Diagnosis Code(s):        --- Professional ---  K63.5, Polyp of colon                           K52.9, Noninfective gastroenteritis and colitis,                            unspecified                           K92.1, Melena (includes Hematochezia) CPT copyright 2019 American Medical Association. All rights reserved. The codes documented in this report are preliminary and upon coder review may  be revised to meet current compliance requirements. Cristopher Estimable. Zakya Halabi, MD Norvel Richards, MD 06/19/2019 8:52:15 AM This report has been signed electronically. Number of Addenda: 0

## 2019-06-19 NOTE — Anesthesia Procedure Notes (Signed)
Date/Time: 06/19/2019 7:44 AM Performed by: Vista Deck, CRNA Pre-anesthesia Checklist: Patient identified, Emergency Drugs available, Suction available, Timeout performed and Patient being monitored Patient Re-evaluated:Patient Re-evaluated prior to induction Oxygen Delivery Method: Nasal Cannula

## 2019-06-19 NOTE — Anesthesia Postprocedure Evaluation (Signed)
Anesthesia Post Note  Patient: [place] N Blas  Procedure(s) Performed: COLONOSCOPY WITH PROPOFOL (N/A ) POLYPECTOMY BIOPSY  Patient location during evaluation: PACU Anesthesia Type: General Level of consciousness: awake and alert and patient cooperative Pain management: satisfactory to patient Vital Signs Assessment: post-procedure vital signs reviewed and stable Respiratory status: spontaneous breathing Postop Assessment: no apparent nausea or vomiting (Patient states she has a bad taste in her mouth.) Anesthetic complications: no     Last Vitals:  Vitals:   06/19/19 0815 06/19/19 0830  BP: 107/66 117/66  Pulse: 61 65  Resp: (!) 21 15  Temp: 36.9 C   SpO2: 100% 100%    Last Pain:  Vitals:   06/19/19 0815  PainSc: 3                  Harlea Goetzinger

## 2019-06-19 NOTE — Anesthesia Preprocedure Evaluation (Signed)
Anesthesia Evaluation  Patient identified by MRN, date of birth, ID band Patient awake  General Assessment Comment:Reports she and her mother have a h/o awareness and itching  Denies either needed ICU stay  Reviewed: Allergy & Precautions, NPO status , Patient's Chart, lab work & pertinent test results  History of Anesthesia Complications (+) AWARENESS UNDER ANESTHESIA, Family history of anesthesia reaction and history of anesthetic complications  Airway Mallampati: II  TM Distance: >3 FB Neck ROM: Full    Dental no notable dental hx. (+) Teeth Intact   Pulmonary neg pulmonary ROS, shortness of breath and with exertion, asthma , former smoker,  Denies any recent pulm meds   Pulmonary exam normal breath sounds clear to auscultation       Cardiovascular Exercise Tolerance: Good hypertension, Pt. on medications and Pt. on home beta blockers negative cardio ROS Normal cardiovascular examI Rhythm:Regular Rate:Normal  States on colanor -denies B blocker therapy  Denies recent CP Reports DOE with exertion - reports can walk a mile    Neuro/Psych  Headaches, Anxiety negative psych ROS   GI/Hepatic Neg liver ROS, GERD  Medicated and Controlled,  Endo/Other  negative endocrine ROS  Renal/GU negative Renal ROS  negative genitourinary   Musculoskeletal negative musculoskeletal ROS (+)   Abdominal   Peds negative pediatric ROS (+)  Hematology negative hematology ROS (+)   Anesthesia Other Findings   Reproductive/Obstetrics negative OB ROS                             Anesthesia Physical Anesthesia Plan  ASA: II  Anesthesia Plan: General   Post-op Pain Management:    Induction: Intravenous  PONV Risk Score and Plan: 3 and TIVA, Propofol infusion, Midazolam and Treatment may vary due to age or medical condition  Airway Management Planned: Nasal Cannula and Simple Face Mask  Additional  Equipment:   Intra-op Plan:   Post-operative Plan:   Informed Consent: I have reviewed the patients History and Physical, chart, labs and discussed the procedure including the risks, benefits and alternatives for the proposed anesthesia with the patient or authorized representative who has indicated his/her understanding and acceptance.     Dental advisory given  Plan Discussed with: CRNA  Anesthesia Plan Comments: (Plan Full PPE use  Plan GA with GETA as needed d/w pt -WTP with same after Q&A)        Anesthesia Quick Evaluation

## 2019-06-19 NOTE — Transfer of Care (Signed)
Immediate Anesthesia Transfer of Care Note  Patient: Dana Mcpherson  Procedure(s) Performed: COLONOSCOPY WITH PROPOFOL (N/A ) POLYPECTOMY BIOPSY  Patient Location: PACU  Anesthesia Type:General  Level of Consciousness: awake and patient cooperative  Airway & Oxygen Therapy: Patient Spontanous Breathing  Post-op Assessment: Report given to RN and Post -op Vital signs reviewed and stable  Post vital signs: Reviewed and stable  Last Vitals:  Vitals Value Taken Time  BP 107/66 06/19/19 0815  Temp 98.4   Pulse 61 06/19/19 0815  Resp 23 06/19/19 0815  SpO2 100 % 06/19/19 0815  Vitals shown include unvalidated device data.  Last Pain:  Vitals:   06/19/19 0800  PainSc: 0-No pain         Complications: No apparent anesthesia complications

## 2019-06-20 LAB — SURGICAL PATHOLOGY

## 2019-06-21 ENCOUNTER — Encounter: Payer: Self-pay | Admitting: Internal Medicine

## 2019-06-23 ENCOUNTER — Other Ambulatory Visit: Payer: Self-pay | Admitting: Advanced Practice Midwife

## 2019-06-24 ENCOUNTER — Telehealth: Payer: Self-pay | Admitting: *Deleted

## 2019-06-24 MED ORDER — LEVONORGEST-ETH ESTRAD 91-DAY 0.15-0.03 MG PO TABS: ORAL_TABLET | ORAL | 0 refills | Status: DC

## 2019-06-24 NOTE — Addendum Note (Signed)
Addended by: Derrek Monaco A on: 06/24/2019 01:33 PM   Modules accepted: Orders

## 2019-06-24 NOTE — Telephone Encounter (Signed)
Patient left message that needs refill on BCP. Patient does have physical scheduled 3/3.

## 2019-06-24 NOTE — Telephone Encounter (Signed)
Refill OCs 

## 2019-06-25 ENCOUNTER — Telehealth: Payer: Self-pay | Admitting: *Deleted

## 2019-06-25 NOTE — Telephone Encounter (Signed)
Pt called stating she needs a refill on her birth control. Pt was advised refill was sent to [facility]. Pt states she uses [facility]. I advised to call [facility] and have them transfer prescription to her current pharmacy. Pt voiced understanding. [place]

## 2019-06-26 ENCOUNTER — Other Ambulatory Visit: Payer: Medicaid Other | Admitting: Advanced Practice Midwife

## 2019-07-03 ENCOUNTER — Ambulatory Visit: Payer: Medicaid Other | Admitting: Gastroenterology

## 2019-07-11 ENCOUNTER — Ambulatory Visit (INDEPENDENT_AMBULATORY_CARE_PROVIDER_SITE_OTHER): Payer: Medicaid Other | Admitting: Advanced Practice Midwife

## 2019-07-11 ENCOUNTER — Other Ambulatory Visit: Payer: Self-pay

## 2019-07-11 ENCOUNTER — Other Ambulatory Visit (HOSPITAL_COMMUNITY)
Admission: RE | Admit: 2019-07-11 | Discharge: 2019-07-11 | Disposition: A | Discharge status: Home / Self Care | Payer: Medicaid Other | Source: Ambulatory Visit | Attending: Advanced Practice Midwife | Admitting: Advanced Practice Midwife

## 2019-07-11 ENCOUNTER — Encounter: Payer: Self-pay | Admitting: Advanced Practice Midwife

## 2019-07-11 VITALS — BP 126/80 | HR 91 | Ht 66.5 in | Wt 232.5 lb

## 2019-07-11 DIAGNOSIS — Z0000 Encounter for general adult medical examination without abnormal findings: Secondary | ICD-10-CM

## 2019-07-11 DIAGNOSIS — Z01419 Encounter for gynecological examination (general) (routine) without abnormal findings: Secondary | ICD-10-CM | POA: Diagnosis not present

## 2019-07-11 NOTE — Progress Notes (Signed)
Dana Mcpherson 29 y.o.  Vitals:   07/11/19 1345  BP: 126/80  Pulse: 91     Filed Weights   07/11/19 1345  Weight: 232 lb 8 oz (105.5 kg)    Past Medical History: Past Medical History:  Diagnosis Date  . Allergy   . Anxiety   . Asthma   . Complication of anesthesia    woke up during surgery & ithing after surgery  . Family history of adverse reaction to anesthesia    "My mom has the same trouble with anesthesia".  . GERD (gastroesophageal reflux disease)   . Migraine headache   . Nexplanon in place 10/05/2015   06/29/15 left arm  . Polycystic ovarian syndrome   . Pregnancy induced hypertension   . Tachycardia   . Vaginal Pap smear, abnormal     Past Surgical History: Past Surgical History:  Procedure Laterality Date  . BIOPSY  06/19/2019   Procedure: BIOPSY;  Surgeon: Daneil Dolin, MD;  [facility];  Service: Endoscopy;;  . CHOLECYSTECTOMY  2010  . COLONOSCOPY WITH PROPOFOL N/A 06/19/2019   Procedure: COLONOSCOPY WITH PROPOFOL;  Surgeon: Daneil Dolin, MD;  [facility]: [facility];  Service: Endoscopy;  Laterality: N/A;  7:30am  . ESOPHAGOGASTRODUODENOSCOPY    . ESOPHAGOGASTRODUODENOSCOPY (EGD) WITH PROPOFOL N/A 04/08/2019   Procedure: ESOPHAGOGASTRODUODENOSCOPY (EGD) WITH PROPOFOL;  Surgeon: Daneil Dolin, MD;  [facility];  Service: Endoscopy;  Laterality: N/A;  10:45am  . MALONEY DILATION N/A 04/08/2019   Procedure: Venia Minks DILATION;  Surgeon: Daneil Dolin, MD;  [facility];  Service: Endoscopy;  Laterality: N/A;  . POLYPECTOMY  06/19/2019   Procedure: POLYPECTOMY;  Surgeon: Daneil Dolin, MD;  [facility];  Service: Endoscopy;;  cecal polyp    Family History: Family History  Problem Relation Age of Onset  . Cancer Paternal Grandmother        ovarian cancer  . Asthma Son   . ADD / ADHD Son   . Colon cancer Other        late 41's    Social History: Social History   Tobacco Use  . Smoking status:  Former Smoker    Packs/day: 0.25    Years: 2.00    Pack years: 0.50    Types: Cigarettes    Quit date: 04/04/2010    Years since quitting: 9.2  . Smokeless tobacco: Never Used  Substance Use Topics  . Alcohol use: No  . Drug use: No    Allergies:  Allergies  Allergen Reactions  . Other Anaphylaxis and Other (See Comments)    Pt states that she is allergic to Axe/Tag body spray and Lysol.    General anesthesia - pt woke up several times, was scratching during surgery. Itching   . Shellfish Allergy Anaphylaxis and Hives  . Adhesive [Tape] Hives  . Eggs Or Egg-Derived Products Itching and Nausea Only  . Latex Itching      Current Outpatient Medications:  .  CORLANOR 5 MG TABS tablet, TAKE 1 & 1/2 TABLETS BY MOUTH 2 TIMES A DAY WITH A MEAL. (Patient taking differently: Take 7.5 mg by mouth 2 (two) times daily. ), Disp: 270 tablet, Rfl: 3 .  Flaxseed, Linseed, (FLAXSEED OIL) 1200 MG CAPS, Take 1,200 mg by mouth 2 (two) times daily., Disp: , Rfl:  .  levonorgestrel-ethinyl estradiol (SEASONALE) 0.15-0.03 MG tablet, TAKE 1 TABLET BY MOUTH EVERY DAY, Disp: 91 tablet, Rfl: 11 .  ondansetron (  ZOFRAN) 4 MG tablet, Take 1 tablet (4 mg total) by mouth every 8 (eight) hours as needed for nausea or vomiting., Disp: 30 tablet, Rfl: 1 .  topiramate (TOPAMAX) 25 MG capsule, Take 50 mg by mouth 2 (two) times daily., Disp: , Rfl:  .  Probiotic Product (RA PROBIOTIC GUMMIES) CHEW, Chew 1 each by mouth in the morning and at bedtime., Disp: , Rfl:   History of Present Illness: here for physical. Last pap 1/19, normal. Had colonoscopy last month for bloody diarrhea  and stomach issues for 2 months.  Colonoscopy was normal,(one polyp). but felt so much better after colon cleanse.  On [place] for [facility], they are great.    Review of Systems   Patient denies any headaches, blurred vision, shortness of breath, chest pain, abdominal pain, problems with bowel movements, urination, or  intercourse.   Physical Exam: General:  Well developed, well nourished, no acute distress Skin:  Warm and dry.  Hidradenitis suppurativa in creases/breast.  Neck:  Midline trachea, normal thyroid Lungs; Clear to auscultation bilaterally Breast:  No dominant palpable mass, retraction, or nipple discharge Cardiovascular: Regular rate and rhythm Abdomen:  Soft, non tender, no hepatosplenomegaly Pelvic:  External genitalia is normal in appearance.  The vagina is normal in appearance.  The cervix is bulbous.  Uterus is felt to be normal size, shape, and contour.  No adnexal masses or tenderness noted. Exam limited by habitus. Extremities:  No swelling or varicosities noted Psych:  No mood changes.     Impression: normal GYN exam     Plan: plan pap q 3 years if normal Plans Humira if ok w/cards (message sent w/info)

## 2019-07-15 LAB — CYTOLOGY - PAP
Comment: NEGATIVE
Diagnosis: NEGATIVE
High risk HPV: NEGATIVE

## 2019-08-20 NOTE — Progress Notes (Signed)
Referring Provider: Sharilyn Sites, MD Primary Care Physician:  Sharilyn Sites, MD Primary GI Physician: Dr. Gala Romney  Chief Complaint  Patient presents with  . Gastroesophageal Reflux    occ but better    HPI:   Dana Mcpherson is a 29 y.o. female presenting for follow-up with history of GERD, dysphagia, diarrhea, elevated LFTs. EGD 04/08/19 with erosive reflux esophagitis s/p dilation, stomach with abnormal congestion diffusely with snakeskin appearance, antral erosions, patent pylorus, congested eroded duodenal bulb, otherwise normal.  LFTs have been mildly elevated in 2010, normalized in 2012, but increased again in July 2020.  Negative hepatitis B and C.  No immunity to hepatitis A.  Unknown immunity to hepatitis B.  Iron panel with ferritin within normal limits.  PT/INR normal.  Ultrasound unremarkable.  Last seen in our office 05/24/2019. Reflux and dysphagia symptoms were much improved.  She was taking Dexilant daily.  Had not started Bentyl for diarrhea due to concerns of possible side effect of constipation.  She had new onset of worsening diarrhea with hematochezia, persistent left-sided abdominal pain, and vomiting secondary to severity of pain starting 1.5-2 weeks prior to office visit.  Had been seen in the ED and diagnosed with UTI initially on 05/13/2019.  Was seen again on 05/19/2019 with WBC 16H, otherwise CBC, CMP, lipase nonrevealing.  Covid negative.  CT abdomen and pelvis without acute abnormalities.  Received Rocephin from PCP on 05/20/2019 for empiric treatment of possible abdominal infection.  She reported mild improvement in stool consistency but continues with abdominal pain.  Rectal bleeding resolved 2 days prior to office visit.  She did report mom and sister had acute illness with diarrhea and vomiting prior to patient's onset of symptoms.  Mild abdominal tenderness to palpation in the left abdomen, greatest in the left upper quadrant.  Suspected acute infectious  gastroenteritis resulting in postinfectious IBS versus flare of underlying IBS.  Plan to update CBC and obtain stool studies if diarrhea returns.  Start Bentyl 10 mg 3 times daily before meals and at bedtime.  Would need colonoscopy at some point in the future to evaluate rectal bleeding.  We discussed this at follow-up.  Regarding elevated LFTs, ALT remained elevated at 65.  Plans to complete additional laboratory work-up.  Labs: 05/24/2019: CBC within normal limits. Autoimmune labs (immunoglobulins, ANA, AMA, ASMA) within normal limits.  No evidence of alpha-1 antitrypsin deficiency.  Ceruloplasmin elevated.  Follow-up 24-hour urine copper within normal limits.  Again hospital did not draw hepatitis B surface antibody but drew hepatitis B surface antigen.    Telephone call 06/13/2019 with patient reporting recurrent bloody stools this week.  Symptoms had cleared up 3 weeks prior after receiving Rocephin from PCP.  Stool studies not completed.  Recommend colonoscopy.  Colonoscopy 06/19/2019: 5 mm sessile polyp in the cecum, otherwise normal appearing colonic mucosa s/p segmental biopsies.  Stool sample submitted for C. difficile.  TI appeared normal.  Suspected symptoms could be due to postinfectious IBS or exacerbation of IBS.  Suspected trivial anorectal bleeding with altered bowel function.  Pathology with tubular adenoma, segmental biopsies benign.  C. difficile negative.  Recommended repeat colonoscopy in 7 years.  Today:   GERD: Doing pretty well on Dexilant. Rare breakthrough symptoms. Maybe once every 2 months. Will use Tums as needed. No soda. Only drinking water. Occasional nausea. This has improved since being on Dexilant. No vomiting.   Pain across upper abdomen. Improved somewhat since starting Dexilant. Hurts when bending over to tie her  shoes. Cardiologist is trying to figure out if fluid is causing symptoms. States she is retaining fluid in her upper body.   Had another round of diarrhea  with nausea about 4 weeks ago. Worsening left sided abdominal pain at that time. Lasted about 3 days. Resolved on its own. Had steak the time before this one. Prior to most recent episode she had subway with uncooked meats. Since then she has avoid steak and uncooked meats and she has been doing well since. Had blood in the stool with this past episode as well. Rectal bleeding has resolve at this point. Has tried bentyl. Didn't work at all so she stopped taking it. Also doesn't want to take anything like this due to occasionally having days where she doesn't have a BM. States her primary concern are the days when she has severe diarrhea with associated severe abdominal pain and rectal bleding. Not bothered by the occasional loose stools as she has had this since her cholecystectomy. States she was pouoring blood. Currently with regular BM daily at this point.   NSAIDs: None  Elevated LFTs: Trying to follow a low fat diet. She is restricted in activity per cardiologist due to tachycardia and possible passing out when heart rate increases.  No swelling in abdomen or lower extremities, confusion, jaundice, or bruising/bleding other than hematochezia discussed above.  Past Medical History:  Diagnosis Date  . Allergy   . Anxiety   . Asthma   . Complication of anesthesia    woke up during surgery & ithing after surgery  . Family history of adverse reaction to anesthesia    "My mom has the same trouble with anesthesia".  . GERD (gastroesophageal reflux disease)   . Migraine headache   . Nexplanon in place 10/05/2015   06/29/15 left arm  . Polycystic ovarian syndrome   . Pregnancy induced hypertension   . Tachycardia   . Vaginal Pap smear, abnormal     Past Surgical History:  Procedure Laterality Date  . BIOPSY  06/19/2019   Procedure: BIOPSY;  Surgeon: Daneil Dolin, MD;  [facility];  Service: Endoscopy;;  . CHOLECYSTECTOMY  2010  . COLONOSCOPY WITH PROPOFOL N/A 06/19/2019    Procedure: COLONOSCOPY WITH PROPOFOL;  Surgeon: Daneil Dolin, MD; 1 tubular adenoma, otherwise normal-appearing colonic mucosa, normal TI.  Stool sample for C. difficile negative.  Recommended repeat colonoscopy in 7 years.  . ESOPHAGOGASTRODUODENOSCOPY    . ESOPHAGOGASTRODUODENOSCOPY (EGD) WITH PROPOFOL N/A 04/08/2019   Procedure: ESOPHAGOGASTRODUODENOSCOPY (EGD) WITH PROPOFOL;  Surgeon: Daneil Dolin, MD; erosive reflux esophagitis s/p dilation, gastric mucosa congested diffusely with snakeskin appearance, antral erosions, congested eroded duodenal bulb, otherwise normal.  . MALONEY DILATION N/A 04/08/2019   Procedure: Venia Minks DILATION;  Surgeon: Daneil Dolin, MD;  [facility];  Service: Endoscopy;  Laterality: N/A;  . POLYPECTOMY  06/19/2019   Procedure: POLYPECTOMY;  Surgeon: Daneil Dolin, MD;  [facility];  Service: Endoscopy;;  cecal polyp    Current Outpatient Medications  Medication Sig Dispense Refill  . CORLANOR 5 MG TABS tablet TAKE 1 & 1/2 TABLETS BY MOUTH 2 TIMES A DAY WITH A MEAL. (Patient taking differently: Take 7.5 mg by mouth 2 (two) times daily. ) 270 tablet 3  . DEXILANT 60 MG capsule Take 1 capsule by mouth daily.    . Flaxseed, Linseed, (FLAXSEED OIL) 1200 MG CAPS Take 1,200 mg by mouth 2 (two) times daily.    Marland Kitchen levonorgestrel-ethinyl estradiol (SEASONALE)  0.15-0.03 MG tablet TAKE 1 TABLET BY MOUTH EVERY DAY 91 tablet 11  . ondansetron (ZOFRAN) 4 MG tablet Take 1 tablet (4 mg total) by mouth every 8 (eight) hours as needed for nausea or vomiting. 30 tablet 1  . topiramate (TOPAMAX) 25 MG capsule Take 50 mg by mouth 2 (two) times daily.    . Probiotic Product (RA PROBIOTIC GUMMIES) CHEW Chew 1 each by mouth in the morning and at bedtime.    . sucralfate (CARAFATE) 1 GM/10ML suspension Take 10 mLs (1 g total) by mouth 4 (four) times daily. 420 mL 1   No current facility-administered medications for this visit.    Allergies as of 08/21/2019  - Review Complete 08/21/2019  Allergen Reaction Noted  . Other Anaphylaxis and Other (See Comments) 08/30/2012  . Shellfish allergy Anaphylaxis and Hives 05/20/2013  . Adhesive [tape] Hives 10/10/2014  . Eggs or egg-derived products Itching and Nausea Only 07/05/2012  . Latex Itching 11/04/2014    Family History  Problem Relation Age of Onset  . Cancer Paternal Grandmother        ovarian cancer  . Asthma Son   . ADD / ADHD Son   . Colon cancer Other        late 22's    Social History   Socioeconomic History  . Marital status: Married    Spouse name: Not on file  . Number of children: Not on file  . Years of education: Not on file  . Highest education level: Not on file  Occupational History  . Not on file  Tobacco Use  . Smoking status: Former Smoker    Packs/day: 0.25    Years: 2.00    Pack years: 0.50    Types: Cigarettes    Quit date: 04/04/2010    Years since quitting: 9.3  . Smokeless tobacco: Never Used  Substance and Sexual Activity  . Alcohol use: No  . Drug use: No  . Sexual activity: Yes    Birth control/protection: Pill  Other Topics Concern  . Not on file  Social History Narrative  . Not on file   Social Determinants of Health   Financial Resource Strain:   . Difficulty of Paying Living Expenses:   Food Insecurity:   . Worried About Charity fundraiser in the Last Year:   . Arboriculturist in the Last Year:   Transportation Needs:   . Film/video editor (Medical):   Marland Kitchen Lack of Transportation (Non-Medical):   Physical Activity:   . Days of Exercise per Week:   . Minutes of Exercise per Session:   Stress:   . Feeling of Stress :   Social Connections:   . Frequency of Communication with Friends and Family:   . Frequency of Social Gatherings with Friends and Family:   . Attends Religious Services:   . Active Member of Clubs or Organizations:   . Attends Archivist Meetings:   Marland Kitchen Marital Status:     Review of Systems: Gen:  Denies fever, chills, cold or flulike symptoms. CV: Denies chest pain Resp: Denies dyspnea at rest, or cough. GI: See HPI Derm: Denies rash Psych: Denies depression, anxiety Heme: See HPI  Physical Exam: BP 125/79   Pulse 74   Temp 97.7 F (36.5 C) (Temporal)   Ht 5' 6.5" (1.689 m)   Wt 230 lb 6.4 oz (104.5 kg)   LMP 06/10/2019   BMI 36.63 kg/m  General:   Alert and  oriented. No distress noted. Pleasant and cooperative.  Head:  Normocephalic and atraumatic. Eyes:  Conjuctiva clear without scleral icterus. Heart:  S1, S2 present without murmurs appreciated. Lungs:  Clear to auscultation bilaterally. No wheezes, rales, or rhonchi. No distress.  Abdomen:  +BS, soft, non-distended.  Mild tenderness palpation in the upper abdomen.  No rebound or guarding. No HSM or masses noted. Msk:  Symmetrical without gross deformities. Normal posture. Extremities:  Without edema. Neurologic:  Alert and  oriented x4 Psych: Normal mood and affect.

## 2019-08-21 ENCOUNTER — Other Ambulatory Visit: Payer: Self-pay

## 2019-08-21 ENCOUNTER — Ambulatory Visit: Payer: Medicaid Other | Admitting: Gastroenterology

## 2019-08-21 ENCOUNTER — Encounter: Payer: Self-pay | Admitting: Gastroenterology

## 2019-08-21 VITALS — BP 125/79 | HR 74 | Temp 97.7°F | Ht 66.5 in | Wt 230.4 lb

## 2019-08-21 DIAGNOSIS — R7989 Other specified abnormal findings of blood chemistry: Secondary | ICD-10-CM | POA: Diagnosis not present

## 2019-08-21 DIAGNOSIS — R197 Diarrhea, unspecified: Secondary | ICD-10-CM

## 2019-08-21 DIAGNOSIS — R1010 Upper abdominal pain, unspecified: Secondary | ICD-10-CM

## 2019-08-21 DIAGNOSIS — K219 Gastro-esophageal reflux disease without esophagitis: Secondary | ICD-10-CM

## 2019-08-21 MED ORDER — SUCRALFATE 1 GM/10ML PO SUSP: 1.0000 g | Freq: Four times a day (QID) | ORAL | 1 refills | Status: DC

## 2019-08-21 NOTE — Patient Instructions (Addendum)
Please have labs and stool test completed.  Continue taking Dexilant 60 mg daily.  Start Carafate 1 g 3 times daily before meals and at bedtime to see if this helps improve your upper abdominal pain.  Please follow a strict GERD diet.  Avoid fried, fatty, greasy, spicy, citrus foods.  Avoid carbonated beverages, caffeine.  Avoid all NSAIDs including ibuprofen, Aleve, Advil, and Goody powders.  Continue to avoid alcohol.  Continue monitoring your intermittent diarrhea.  Keep a strict log of what you have eaten when you have diarrhea. Continue avoiding undercooked meats and deli meats. Follow a low-fat diet.  All meats consumed should be lean (poultry or fish).  Follow a lactose-free diet or take Lactaid tablets prior to dairy consumption.  For your elevated liver function test: We will plan to recheck this at your next visit as you have been stable. I am checking labs to evaluate for hepatitis B immunity. Continue avoiding over-the-counter supplements. Avoid all alcohol. Limit Tylenol.    We will see back in 3-4 months.  Call with questions or concerns prior.  Aliene Altes, PA-C [facility]

## 2019-08-25 ENCOUNTER — Encounter: Payer: Self-pay | Admitting: Gastroenterology

## 2019-08-25 ENCOUNTER — Telehealth: Payer: Self-pay | Admitting: Gastroenterology

## 2019-08-25 NOTE — Telephone Encounter (Signed)
Dana Mcpherson, I had ordered labs at patients last visit. They have not been completed yet. I would like to add on H. Pylori IgG antibody to help evaluate her upper abdominal pain. Can you please let patient know and arrange?

## 2019-08-25 NOTE — Assessment & Plan Note (Addendum)
Mild upper abdominal pain present for several months worsened with bending over.  EGD 04/08/19 with erosive reflux esophagitis s/p dilation, stomach with abnormal congestion diffusely with snakeskin appearance, antral erosions, patent pylorus, congested eroded duodenal bulb, otherwise normal.  No biopsies taken. Pain has improved somewhat since starting Dexilant.  Other GI symptoms include intermittent episodes of significant diarrhea with severe abdominal pain and hematochezia of unclear etiology.  She does note rare steak and deli meat prior to most recent episodes.  Colonoscopy 06/19/2019 with normal TI, 1 tubular adenoma, segmental biopsies benign, stool sample submitted for C. difficile negative.  She also reports cardiology is evaluating her for upper body fluid retention and query whether this may be contributing to upper abdominal pain.  Recent CT January 2021 without acute findings.  Hemoglobin 15.4 on 06/14/19.   Differentials include gastritis, duodenitis, H. Pylori, celiac disease, undiagnosed IBD, other small bowel abnormality that has not been identified.  Unclear of the significance of reported upper body fluid retention.   Continue Dexilant 60 mg daily. Start Carafate 1 g daily 3 times before meals and at bedtime. Follow strict GERD diet. Avoid all NSAIDs. Avoid alcohol. Keep log of episodes of diarrhea/foods eaten. Continue avoiding undercooked meats and deli meats for now. Obtain additional labs including TTG IgA (prior IgA total within normal limits), fecal calprotectin, ESR, sed rate to correlate with any potential inflammatory component.  We will also check H. pylori antibody, IgG. (Will have nurse arrange as this was decided after patient left the office).  Further recommendations to follow. Plan follow-up in the office in 3-4 months.

## 2019-08-25 NOTE — Assessment & Plan Note (Addendum)
Symptoms continue to be fairly well controlled on Dexilant daily.  Currently using Tums as needed for breakthrough symptoms which occur about once every couple of months. No NSAIDs.  She does report mild upper abdominal pain that worsens with bending over.  This has improved somewhat since starting Dexilant.  Also states cardiologist is trying to determine if upper abdominal pain is coming from fluid retention in her upper body. Prior EGD in December 2020 with erosive reflux esophagitis s/p dilation, stomach with abnormal congestion diffusely with snakeskin appearance, antral erosions, congested eroded duodenal bulb.   Plan: Continue Dexilant 60 mg daily. Start Carafate 1 g 3 times daily before meals and at bedtime to see if this helps improve upper abdominal pain. Follow a strict GERD diet.  Avoid fried, fatty, greasy, spicy, citrus foods.  Avoid carbonated beverages, caffeine. Continue to avoid all NSAIDs including ibuprofen, Aleve, Advil, and Goody powders. Continue to avoid alcohol. Follow-up in 3-4 months.  Call with worsening symptoms.

## 2019-08-25 NOTE — Assessment & Plan Note (Addendum)
29 year old female with intermittent postprandial loose stools since cholecystectomy also reporting intermittent episodes of severe diarrhea with associated severe abdominal pain and hematochezia which she describes as "pouring blood."  Severe symptoms tend to last a few days at a time and resolve spontaneously.  Tried Bentyl during severe episodes without relief. Most recent episodes following rare steak consumption and most recently 4 weeks ago following deli meat from [place].  Has been avoiding undercooked meats and deli meats and has had resolution of symptoms.  EGD 04/08/2019 with erosive reflux esophagitis, gastric mucosa with diffuse congestion with snakeskin appearance, antral erosions, congested eroded duodenal bulb.  Colonoscopy 06/19/2019 with 1 tubular adenoma, otherwise normal-appearing colonic mucosa, normal-appearing TI.  Segmental colon biopsies benign.  Stool sample submitted for C. difficile negative. Suspected IBS with possible trivial anorectal bleed secondary to altered bowel function. Hemoglobin 15.4 in February 2021.  Suspect postprandial loose stools are likely secondary to bile salt diarrhea vs underlying IBS. She could also have celiac disease. With no biopsies taken on EGD, will plan to evaluate for this. Etiology of more severe episodes may be secondary to IBS exacerbation with benign anorectal bleeding; however, the severity of rectal bleeding she describes doesn't seem quite consistent with this although her hemoglobin has remained within normal limits. Bleeding may appear more than it actually is. Still query other small bowel etiology that we have not appreciated including Crohn's disease localized to the small bowel. May also have food intolerances/allergy contributing.   Plan:  TTG IgA for celiac disease. (Previously IgA total within normal limits) Sed rate and CRP to evaluate for inflammatory component. Follow a low-fat diet. Follow a lactose-free diet or take Lactaid pills  prior to dairy consumption. Continue avoiding undercooked meats and deli meats for now. Keep a strict log of foods eaten prior to the onset of diarrhea. Of note, patient does not want to take Bentyl or other similar medications as she is concerned about side effect of constipation. Plan follow-up in the office in 3-4 months.

## 2019-08-25 NOTE — Assessment & Plan Note (Addendum)
29 year old female with mildly elevated LFTs without identified cause.  Historically, she had slight LFT elevation in 2010, normalized in 2012, but increase again in July 2020.  Abdominal ultrasound November 2020 unremarkable.  Prior ultrasound in January 2019 suggestive of hepatic steatosis.  Hepatitis A, B, and C negative.  No immunity to hepatitis A.  Unknown immunity to hepatitis B.  Iron panel within normal limits.  INR normal. Autoimmune labs (immunoglobulins, ANA, AMA, ASMA) within normal limits.  No evidence of alpha-1 antitrypsin deficiency.  Ceruloplasmin elevated.  Follow-up 24-hour urine copper within normal limits. Most recent labs on file January 2021 with stable mild elevation of ALT at 64, otherwise LFTs within normal limits.   No signs or symptoms of advanced liver disease at this time.   With history of HTN and BMI of 36, suspect mildly elevated ALT may be secondary to fatty liver that most recent ultrasound did not pick up.  We will continue to monitor and plan to recheck LFTs at next office visit.  We will go ahead and obtain hepatitis B surface antibody to evaluate immunity status. Advised to continue avoiding over-the-counter supplements, avoid alcohol, and limit Tylenol.  Follow-up in the office in 3-4 months.

## 2019-08-26 ENCOUNTER — Other Ambulatory Visit: Payer: Self-pay

## 2019-08-26 ENCOUNTER — Other Ambulatory Visit: Payer: Self-pay | Admitting: Gastroenterology

## 2019-08-26 ENCOUNTER — Other Ambulatory Visit: Payer: Self-pay | Admitting: Cardiovascular Disease

## 2019-08-26 DIAGNOSIS — R110 Nausea: Secondary | ICD-10-CM

## 2019-08-26 DIAGNOSIS — R1010 Upper abdominal pain, unspecified: Secondary | ICD-10-CM

## 2019-08-26 DIAGNOSIS — Z789 Other specified health status: Secondary | ICD-10-CM

## 2019-08-26 DIAGNOSIS — R109 Unspecified abdominal pain: Secondary | ICD-10-CM

## 2019-08-26 MED ORDER — PROMETHAZINE HCL 12.5 MG PO TABS: 12.5000 mg | ORAL_TABLET | Freq: Four times a day (QID) | ORAL | 0 refills | Status: DC | PRN

## 2019-08-26 NOTE — Telephone Encounter (Signed)
Spoke with pt. Lab orders will be added and faxed to [facility]. Pt plans on going to the lab on Tuesday. Pt states she was going to call to see if you wanted to see her sick. Pt started feeling bad yesterday. C/o nausea, diarrhea, blood in stool. Pt states that this morning the whole toilet is filled with blood. Pt states that she has a hemorrhoid and her head feels tingly. Pt was advised to go to the ED if she was feeling lightheaded. Pt also said [facility] hasn't seen me when I'm sick and may need to evaluate me. Pt is taking the Zofran and feels it isn't helping this morning. Pt is also taking Dexilant 60 mg and hasn't picked up the Carafate. Pt plans to pick it up this week.

## 2019-08-26 NOTE — Telephone Encounter (Signed)
She does not necessarily need to be seen in the office as these are the same symptoms she has been having intermittently over the last few months. I do not have any openings this week. If she feels she needs to be seen, she will have to see another provider. Is she vomiting? Abdominal pain? What did she eat prior to onset of symptoms?   Would recommend she back down to a bland diet or even a liquid diet over the next couple of days until her symptoms improve. The most important thing is to ensure she is getting plenty of liquids. She can try Pedialyte/Gatorade to help replace electrolytes. Continue Zofran. If this doesn't help with nausea as today goes on, we can try Phenergan. She should know Phenergan will cause drossiness.   For her hemorrhoid, does she have any cream to use for this? I can send in anusol cream to use BID x 7 days.   If she continues with significant rectal bleeding or has lightheadedness, dizziness, or feeling like she will pass out she should proceed to the ED.

## 2019-08-26 NOTE — Telephone Encounter (Signed)
Spoke with pt. New lab order has been placed and faxed with all labs needed. Pt wrote lab test needed down so the lab will complete all labs needed. Pt is aware that Phenergan will be sent to pts pharmacy and wont take med with Zofran.

## 2019-08-26 NOTE — Telephone Encounter (Signed)
Spoke with pt. Pt isn't having any vomiting, just nausea. Pt is having upper abdominal pain and it hurts when she is having a BM. Pt ate a hamburger and a hotdog prior to symptoms starting. Pt says she's eaten that before and has had no problems with it. Pt is taking Zofran and would like for [facility] to send Phenergan in for night time use. Pt states when she gets like this, she doesn't sleep at night. Pt is using otc preparation h a couple times a day. Pt is going to try a bland or liquid diet, stay hydrated and will call back to schedule an apt if needed.

## 2019-08-26 NOTE — Telephone Encounter (Signed)
It is interesting that her symptoms most recently have been associated with red meat. Query whether she has developed an allergy. Lets also add on an Alpha-gal panel to her labs.   I will send in Phenergan. She should not take this with Zofran.

## 2019-08-27 ENCOUNTER — Other Ambulatory Visit: Payer: Self-pay

## 2019-08-27 DIAGNOSIS — R1010 Upper abdominal pain, unspecified: Secondary | ICD-10-CM

## 2019-08-27 DIAGNOSIS — Z79899 Other long term (current) drug therapy: Secondary | ICD-10-CM

## 2019-08-27 NOTE — Telephone Encounter (Signed)
Noted. Thank you for getting this arranged.

## 2019-08-27 NOTE — Telephone Encounter (Signed)
Noted  

## 2019-08-27 NOTE — Telephone Encounter (Signed)
H. Pylori breath test is not ideal. She needs to be off PPI for 2 weeks for this and I am not sure patient will tolerate this. If patient feels she can discontinue PPI for 2 weeks, we can do H. Pylori breath test.

## 2019-08-27 NOTE — Telephone Encounter (Signed)
Spoke with [facility], they drawn H. Pylori Blood and send it out to [facility]. I spoke with pt and she prefers to go to [facility] to have her blood drawn since she had to get stuck several times previously. Pt will have labs drawn this week. Orders were placed and released.

## 2019-08-27 NOTE — Telephone Encounter (Signed)
Linda from [facility] called. They can draw all labs needed except H. Pylori blood test. [facility] only does H. Pylori breath test and pt drunk water 30 mins before her apt. Please advise if pt will need to switch testing to H. Pylori Breath test and when it needs to be completed.

## 2019-08-28 ENCOUNTER — Telehealth: Payer: Self-pay | Admitting: *Deleted

## 2019-08-28 ENCOUNTER — Other Ambulatory Visit: Payer: Self-pay | Admitting: Gastroenterology

## 2019-08-28 DIAGNOSIS — R1010 Upper abdominal pain, unspecified: Secondary | ICD-10-CM

## 2019-08-28 MED ORDER — SUCRALFATE 1 G PO TABS: 1.0000 g | ORAL_TABLET | Freq: Three times a day (TID) | ORAL | 1 refills | Status: DC

## 2019-08-28 NOTE — Telephone Encounter (Signed)
See result note.  

## 2019-08-28 NOTE — Progress Notes (Signed)
CPR and sed rate are elevated. This suggest there may be an inflammatory component to her diarrhea. Keep in mind, these labs are not specific for colonic inflammation only. We are waiting on Fecal Calprotectin and will have further recommendations to follow. Does anyone in her family have Crohns or Ulcerative colitis?  We are also waiting on Alpha-gal.   No evidence of celiac disease.  She is immune to hepatitis B.

## 2019-08-28 NOTE — Telephone Encounter (Signed)
Pt wants to discuss lab results.  [phone]

## 2019-08-28 NOTE — Telephone Encounter (Signed)
Spoke with pt.pt notified that RX was sent to pharmacy.

## 2019-08-28 NOTE — Telephone Encounter (Signed)
Noted  

## 2019-08-28 NOTE — Telephone Encounter (Signed)
Spoke with pt. Pt would like to discuss lab results. Pts labs were viewed on mychart. Pt is aware that our office hasn't reviewed labs yet and will contact her with results. Soon.

## 2019-08-28 NOTE — Telephone Encounter (Signed)
Rx sent for Carafate tablets

## 2019-08-28 NOTE — Telephone Encounter (Signed)
VM received. Pt was prescribed Carafate suspension. Pt states she can't stomach swallowing it. Pt is asking if there is something else she can take or can the Carafate pill be sent in? Please advise.

## 2019-09-02 LAB — ALPHA-GAL PANEL
Beef IgE: <0.1 kU/L (ref <0.35)
Class: 0
Class: 0
Class: 0
Galactose-alpha-1,3-galactose IgE: <0.1 kU/L (ref <0.10)
LAMB/MUTTON IGE: <0.1 kU/L (ref <0.35)
Pork IgE: <0.1 kU/L (ref <0.35)

## 2019-09-02 LAB — TISSUE TRANSGLUTAMINASE, IGA: (tTG) Ab, IgA: 1 U/mL

## 2019-09-02 LAB — C-REACTIVE PROTEIN: CRP: 43.5 mg/L — ABNORMAL HIGH (ref <8.0)

## 2019-09-02 LAB — SEDIMENTATION RATE: Sed Rate: 53 mm/h — ABNORMAL HIGH (ref 0–20)

## 2019-09-02 LAB — HEPATITIS B SURFACE ANTIBODY,QUALITATIVE: Hep B S Ab: REACTIVE — AB

## 2019-09-12 ENCOUNTER — Telehealth (INDEPENDENT_AMBULATORY_CARE_PROVIDER_SITE_OTHER): Payer: Medicaid Other | Admitting: Cardiovascular Disease

## 2019-09-12 ENCOUNTER — Encounter: Payer: Self-pay | Admitting: Cardiovascular Disease

## 2019-09-12 VITALS — Ht 66.5 in | Wt 225.0 lb

## 2019-09-12 DIAGNOSIS — R1900 Intra-abdominal and pelvic swelling, mass and lump, unspecified site: Secondary | ICD-10-CM | POA: Diagnosis not present

## 2019-09-12 DIAGNOSIS — R002 Palpitations: Secondary | ICD-10-CM | POA: Diagnosis not present

## 2019-09-12 DIAGNOSIS — R000 Tachycardia, unspecified: Secondary | ICD-10-CM

## 2019-09-12 DIAGNOSIS — R1084 Generalized abdominal pain: Secondary | ICD-10-CM | POA: Diagnosis not present

## 2019-09-12 NOTE — Progress Notes (Signed)
Virtual Visit via Telephone Note   This visit type was conducted due to national recommendations for restrictions regarding the COVID-19 Pandemic (e.g. social distancing) in an effort to limit this patient's exposure and mitigate transmission in our community.  Due to her co-morbid illnesses, this patient is at least at moderate risk for complications without adequate follow up.  This format is felt to be most appropriate for this patient at this time.  The patient did not have access to video technology/had technical difficulties with video requiring transitioning to audio format only (telephone).  All issues noted in this document were discussed and addressed.  No physical exam could be performed with this format.  Please refer to the patient's chart for her  consent to telehealth for [facility].   The patient was identified using 2 identifiers.  Date:  09/12/2019   ID:  Dana N Teixeira, DOB Feb 14, 1991, MRN [id number]  Patient Location: Home Provider Location: Home  PCP:  Sharilyn Sites, MD  Cardiologist:  Kate Sable, MD  Electrophysiologist:  None   Evaluation Performed:  Follow-Up Visit  Chief Complaint:  Tachycardia/palpitations.  History of Present Illness:    Dana Mcpherson is a 29 y.o. female with tachycardia and palpitations with an inappropriate sinus tachycardia.  She takes ivabradine.  She also has polycystic ovarian syndrome.  Her problems with tachycardia began after she was pregnant with her son, Max, in 2016.  She has a history of abdominal pain, diarrhea, IBS, and GERD and most recently saw gastroenterology on 08/25/2019.  She is being treated with Dexilant and Carafate.  ESR high at 53, CRP elevated at 43.5 on 08/27/19.  She has problems eating red meat as it aggravates abdominal pain and IBS.  She continues to have episodic elevated HR's, aggravated when the temps are hot.   Past Medical History:  Diagnosis Date  . Allergy   . Anxiety   . Asthma   .  Complication of anesthesia    woke up during surgery & ithing after surgery  . Family history of adverse reaction to anesthesia    "My mom has the same trouble with anesthesia".  . GERD (gastroesophageal reflux disease)   . Migraine headache   . Nexplanon in place 10/05/2015   06/29/15 left arm  . Polycystic ovarian syndrome   . Pregnancy induced hypertension   . Tachycardia   . Vaginal Pap smear, abnormal    Past Surgical History:  Procedure Laterality Date  . BIOPSY  06/19/2019   Procedure: BIOPSY;  Surgeon: Daneil Dolin, MD;  [facility];  Service: Endoscopy;;  . CHOLECYSTECTOMY  2010  . COLONOSCOPY WITH PROPOFOL N/A 06/19/2019   Procedure: COLONOSCOPY WITH PROPOFOL;  Surgeon: Daneil Dolin, MD; 1 tubular adenoma, otherwise normal-appearing colonic mucosa, normal TI.  Stool sample for C. difficile negative.  Recommended repeat colonoscopy in 7 years.  . ESOPHAGOGASTRODUODENOSCOPY    . ESOPHAGOGASTRODUODENOSCOPY (EGD) WITH PROPOFOL N/A 04/08/2019   Procedure: ESOPHAGOGASTRODUODENOSCOPY (EGD) WITH PROPOFOL;  Surgeon: Daneil Dolin, MD; erosive reflux esophagitis s/p dilation, gastric mucosa congested diffusely with snakeskin appearance, antral erosions, congested eroded duodenal bulb, otherwise normal.  . MALONEY DILATION N/A 04/08/2019   Procedure: Venia Minks DILATION;  Surgeon: Daneil Dolin, MD;  [facility];  Service: Endoscopy;  Laterality: N/A;  . POLYPECTOMY  06/19/2019   Procedure: POLYPECTOMY;  Surgeon: Daneil Dolin, MD;  [facility];  Service: Endoscopy;;  cecal polyp     Current Meds  Medication Sig  . CORLANOR 5 MG TABS tablet TAKE 1 & 1/2 TABLETS BY MOUTH 2 TIMES A DAY WITH A MEAL.  Marland Kitchen DEXILANT 60 MG capsule Take 1 capsule by mouth daily.  . Flaxseed, Linseed, (FLAXSEED OIL) 1200 MG CAPS Take 1,200 mg by mouth 2 (two) times daily.  Marland Kitchen levonorgestrel-ethinyl estradiol (SEASONALE) 0.15-0.03 MG tablet TAKE 1 TABLET BY MOUTH EVERY DAY    . ondansetron (ZOFRAN) 4 MG tablet Take 1 tablet (4 mg total) by mouth every 8 (eight) hours as needed for nausea or vomiting.  . sucralfate (CARAFATE) 1 g tablet Take 1 tablet (1 g total) by mouth 4 (four) times daily -  with meals and at bedtime.  . topiramate (TOPAMAX) 25 MG capsule Take 50 mg by mouth 2 (two) times daily.     Allergies:   Other, Shellfish allergy, Adhesive [tape], Eggs or egg-derived products, and Latex   Social History   Tobacco Use  . Smoking status: Former Smoker    Packs/day: 0.25    Years: 2.00    Pack years: 0.50    Types: Cigarettes    Quit date: 04/04/2010    Years since quitting: 9.4  . Smokeless tobacco: Never Used  Substance Use Topics  . Alcohol use: No  . Drug use: No     Family Hx: The patient's family history includes ADD / ADHD in her son; Asthma in her son; Cancer in her paternal grandmother; Colon cancer in an other family member.  ROS:   Please see the history of present illness.     All other systems reviewed and are negative.   Prior CV studies:   The following studies were reviewed today:  Echocardiogram in May 2018 demonstrated normal left ventricular systolic function, LVEF 60 to 65%.  Labs/Other Tests and Data Reviewed:    EKG:  No ECG reviewed.  Recent Labs: 10/25/2018: TSH 2.384 05/19/2019: ALT 64 06/14/2019: BUN 9; Creat 1.01; Hemoglobin 15.4; Platelets 375; Potassium 4.1; Sodium 138   Recent Lipid Panel No results found for: CHOL, TRIG, HDL, CHOLHDL, LDLCALC, LDLDIRECT  Wt Readings from Last 3 Encounters:  09/12/19 225 lb (102.1 kg)  08/21/19 230 lb 6.4 oz (104.5 kg)  07/11/19 232 lb 8 oz (105.5 kg)     Objective:    Vital Signs:  Ht 5' 6.5" (1.689 m)   Wt 225 lb (102.1 kg)   BMI 35.77 kg/m    VITAL SIGNS:  reviewed  ASSESSMENT & PLAN:    1. Tachycardia and palpitations/inappropriate sinus tachycardia: Continue ivabradine 7.5 mg twice daily. She did not tolerate metoprolol as it led to hypotension.   ESR and CRP are elevated indicative of underlying inflammatory disorder which is only contributing to tachycardia.  She is also being followed by GI.  2.  Abdominal pain/IBS/diarrhea/GERD: She is being treated by GI with Dexilant and Carafate.  She has an underlying inflammatory disorder substantiated by elevated ESR and CRP.    COVID-19 Education: The signs and symptoms of COVID-19 were discussed with the patient and how to seek care for testing (follow up with PCP or arrange E-visit).  The importance of social distancing was discussed today.  Time:   Today, I have spent 15 minutes with the patient with telehealth technology discussing the above problems.     Medication Adjustments/Labs and Tests Ordered: Current medicines are reviewed at length with the patient today.  Concerns regarding medicines are outlined above.   Tests Ordered: No orders of the defined types were  placed in this encounter.   Medication Changes: No orders of the defined types were placed in this encounter.   Follow Up:  Virtual Visit  in 6 month(s)  Signed, Kate Sable, MD  09/12/2019 9:59 AM    [place]

## 2019-09-12 NOTE — Patient Instructions (Signed)
Medication Instructions:  Your physician recommends that you continue on your current medications as directed. Please refer to the Current Medication list given to you today.  *If you need a refill on your cardiac medications before your next appointment, please call your pharmacy*   Lab Work: NONE   If you have labs (blood work) drawn today and your tests are completely normal, you will receive your results only by: Marland Kitchen MyChart Message (if you have MyChart) OR . A paper copy in the mail If you have any lab test that is abnormal or we need to change your treatment, we will call you to review the results.   Testing/Procedures: NONE    Follow-Up: At [facility], you and your health needs are our priority.  As part of our continuing mission to provide you with exceptional heart care, we have created designated Provider Care Teams.  These Care Teams include your primary Cardiologist (physician) and Advanced Practice Providers (APPs -  Physician Assistants and Nurse Practitioners) who all work together to provide you with the care you need, when you need it.  We recommend signing up for the patient portal called "MyChart".  Sign up information is provided on this After Visit Summary.  MyChart is used to connect with patients for Virtual Visits (Telemedicine).  Patients are able to view lab/test results, encounter notes, upcoming appointments, etc.  Non-urgent messages can be sent to your provider as well.   To learn more about what you can do with MyChart, go to [email or web address].    Your next appointment:   6 month(s)  The format for your next appointment:   Virtual Visit   Provider:   Kate Sable, MD   Other Instructions Thank you for choosing [facility]!

## 2019-09-24 ENCOUNTER — Other Ambulatory Visit: Payer: Self-pay | Admitting: Adult Health

## 2019-10-01 ENCOUNTER — Telehealth: Payer: Self-pay | Admitting: Cardiovascular Disease

## 2019-10-01 NOTE — Telephone Encounter (Signed)
Per Erlene Quan, pharmacist at [facility]-corlanor is ready to be picked up. Mychart message sent to patient.

## 2019-10-01 NOTE — Telephone Encounter (Signed)
Patient called stating that [facility] cannot find prescription for CORLANOR 5 MG TABS tablet

## 2019-10-10 ENCOUNTER — Ambulatory Visit
Admission: EM | Admit: 2019-10-10 | Discharge: 2019-10-10 | Disposition: A | Discharge status: Home / Self Care | Payer: Medicaid Other | Attending: Family Medicine | Admitting: Family Medicine

## 2019-10-10 ENCOUNTER — Other Ambulatory Visit: Payer: Self-pay

## 2019-10-10 DIAGNOSIS — J209 Acute bronchitis, unspecified: Secondary | ICD-10-CM | POA: Diagnosis not present

## 2019-10-10 DIAGNOSIS — J0110 Acute frontal sinusitis, unspecified: Secondary | ICD-10-CM

## 2019-10-10 MED ORDER — PREDNISONE 10 MG (21) PO TBPK: ORAL_TABLET | Freq: Every day | ORAL | 0 refills | Status: AC

## 2019-10-10 MED ORDER — ALBUTEROL SULFATE HFA 108 (90 BASE) MCG/ACT IN AERS: 2.0000 | INHALATION_SPRAY | RESPIRATORY_TRACT | 0 refills | Status: DC | PRN

## 2019-10-10 MED ORDER — AMOXICILLIN 875 MG PO TABS: 875.0000 mg | ORAL_TABLET | Freq: Two times a day (BID) | ORAL | 0 refills | Status: AC

## 2019-10-10 NOTE — ED Triage Notes (Signed)
Pt presents with c/o nasal congestion for past week and has developed cough and some mild sob

## 2019-10-10 NOTE — Discharge Instructions (Addendum)
You have a sinus infection.  I have prescribed amoxicillin 875mg  twice a day for 10 days.  I have prescribed a steroid taper for you to take. Take this as directed on the label.  Follow up with this office or with primary care if you are not feeling better over the next 2 days.  Follow up with the ER for trouble swallowing, trouble breathing, other concerning symptoms.

## 2019-10-10 NOTE — ED Provider Notes (Signed)
[facility]   [id number] 10/10/19 Arrival Time: 1237  JT:TSVX THROAT  SUBJECTIVE: History from: patient.  Dana Mcpherson is a 29 y.o. female who presents with abrupt onset of nasal congestion, headache, sinus pain and pressure, fatigue for the last week. Denies to sick exposure to Covid, strep, flu or mono, or precipitating event. Has tried sudafed without relief. There are no aggravating symptoms. Reports previous symptoms in the past. Requesting Covid testing today because her elderly grandmother is coming to visit her.     Denies fever, chills, fatigue, ear pain, rhinorrhea, nasal congestion, cough, SOB, wheezing, chest pain, nausea, rash, changes in bowel or bladder habits.     ROS: As per HPI.  All other pertinent ROS negative.     Past Medical History:  Diagnosis Date   Allergy    Anxiety    Asthma    Complication of anesthesia    woke up during surgery & ithing after surgery   Family history of adverse reaction to anesthesia    "My mom has the same trouble with anesthesia".   GERD (gastroesophageal reflux disease)    Migraine headache    Nexplanon in place 10/05/2015   06/29/15 left arm   Polycystic ovarian syndrome    Pregnancy induced hypertension    Tachycardia    Vaginal Pap smear, abnormal    Past Surgical History:  Procedure Laterality Date   BIOPSY  06/19/2019   Procedure: BIOPSY;  Surgeon: Daneil Dolin, MD;  [facility];  Service: Endoscopy;;   CHOLECYSTECTOMY  2010   COLONOSCOPY WITH PROPOFOL N/A 06/19/2019   Procedure: COLONOSCOPY WITH PROPOFOL;  Surgeon: Daneil Dolin, MD; 1 tubular adenoma, otherwise normal-appearing colonic mucosa, normal TI.  Stool sample for C. difficile negative.  Recommended repeat colonoscopy in 7 years.   ESOPHAGOGASTRODUODENOSCOPY     ESOPHAGOGASTRODUODENOSCOPY (EGD) WITH PROPOFOL N/A 04/08/2019   Procedure: ESOPHAGOGASTRODUODENOSCOPY (EGD) WITH PROPOFOL;  Surgeon: Daneil Dolin, MD;  erosive reflux esophagitis s/p dilation, gastric mucosa congested diffusely with snakeskin appearance, antral erosions, congested eroded duodenal bulb, otherwise normal.   MALONEY DILATION N/A 04/08/2019   Procedure: Venia Minks DILATION;  Surgeon: Daneil Dolin, MD;  [facility];  Service: Endoscopy;  Laterality: N/A;   POLYPECTOMY  06/19/2019   Procedure: POLYPECTOMY;  Surgeon: Daneil Dolin, MD;  [facility];  Service: Endoscopy;;  cecal polyp   Allergies  Allergen Reactions   Other Anaphylaxis and Other (See Comments)    Pt states that she is allergic to Axe/Tag body spray and Lysol.    General anesthesia - pt woke up several times, was scratching during surgery. Itching   Allergy to sun and bug bites.   Shellfish Allergy Anaphylaxis and Hives   Adhesive [Tape] Hives   Eggs Or Egg-Derived Products Itching and Nausea Only   Latex Itching   No current facility-administered medications on file prior to encounter.   Current Outpatient Medications on File Prior to Encounter  Medication Sig Dispense Refill   CORLANOR 5 MG TABS tablet TAKE 1 & 1/2 TABLETS BY MOUTH 2 TIMES A DAY WITH A MEAL. 120 tablet 1   DEXILANT 60 MG capsule Take 1 capsule by mouth daily.     Flaxseed, Linseed, (FLAXSEED OIL) 1200 MG CAPS Take 1,200 mg by mouth 2 (two) times daily.     levonorgestrel-ethinyl estradiol (SEASONALE) 0.15-0.03 MG tablet TAKE ONE TABLET BY MOUTH ONCE DAILY. 91 tablet 0   ondansetron (ZOFRAN) 4 MG tablet Take  1 tablet (4 mg total) by mouth every 8 (eight) hours as needed for nausea or vomiting. 30 tablet 1   Probiotic Product (RA PROBIOTIC GUMMIES) CHEW Chew 1 each by mouth in the morning and at bedtime.     sucralfate (CARAFATE) 1 g tablet Take 1 tablet (1 g total) by mouth 4 (four) times daily -  with meals and at bedtime. 120 tablet 1   topiramate (TOPAMAX) 25 MG capsule Take 50 mg by mouth 2 (two) times daily.     Social History   Socioeconomic  History   Marital status: Married    Spouse name: Not on file   Number of children: Not on file   Years of education: Not on file   Highest education level: Not on file  Occupational History   Not on file  Tobacco Use   Smoking status: Former Smoker    Packs/day: 0.25    Years: 2.00    Pack years: 0.50    Types: Cigarettes    Quit date: 04/04/2010    Years since quitting: 9.5   Smokeless tobacco: Never Used  Vaping Use   Vaping Use: Never used  Substance and Sexual Activity   Alcohol use: No   Drug use: No   Sexual activity: Yes    Birth control/protection: Pill  Other Topics Concern   Not on file  Social History Narrative   Not on file   Social Determinants of Health   Financial Resource Strain:    Difficulty of Paying Living Expenses:   Food Insecurity:    Worried About Charity fundraiser in the Last Year:    Arboriculturist in the Last Year:   Transportation Needs:    Film/video editor (Medical):    Lack of Transportation (Non-Medical):   Physical Activity:    Days of Exercise per Week:    Minutes of Exercise per Session:   Stress:    Feeling of Stress :   Social Connections:    Frequency of Communication with Friends and Family:    Frequency of Social Gatherings with Friends and Family:    Attends Religious Services:    Active Member of Clubs or Organizations:    Attends Music therapist:    Marital Status:   Intimate Partner Violence:    Fear of Current or Ex-Partner:    Emotionally Abused:    Physically Abused:    Sexually Abused:    Family History  Problem Relation Age of Onset   Cancer Paternal Grandmother        ovarian cancer   Asthma Son    ADD / ADHD Son    Colon cancer Other        late 95's    OBJECTIVE:  Vitals:   10/10/19 1248  BP: (!) 143/82  Pulse: 87  Resp: 18  Temp: 97.8 F (36.6 C)  SpO2: 98%     General appearance: alert; appears fatigued, but nontoxic, speaking  in full sentences and managing own secretions HEENT: NCAT; Ears: EACs clear, TMs pearly gray with visible cone of light, without erythema; Eyes: PERRL, EOMI grossly; Nose: no obvious rhinorrhea; Throat: oropharynx clear, tonsils 1+ and mildly erythematous without white tonsillar exudates, uvula midline; maxillary sinus tenderness Neck: supple without LAD Lungs: CTA bilaterally without adventitious breath sounds; cough absent Heart: regular rate and rhythm.  Radial pulses 2+ symmetrical bilaterally Skin: warm and dry Psychological: alert and cooperative; normal mood and affect  LABS: No results  found for this or any previous visit (from the past 24 hour(s)).   ASSESSMENT & PLAN:  1. Acute bronchitis, unspecified organism   2. Acute non-recurrent frontal sinusitis     Meds ordered this encounter  Medications   albuterol (VENTOLIN HFA) 108 (90 Base) MCG/ACT inhaler    Sig: Inhale 2 puffs into the lungs every 4 (four) hours as needed for wheezing or shortness of breath.    Dispense:  18 g    Refill:  0    Order Specific Question:   Supervising Provider    Answer:   Chase Picket [[id number]]   amoxicillin (AMOXIL) 875 MG tablet    Sig: Take 1 tablet (875 mg total) by mouth 2 (two) times daily for 10 days.    Dispense:  20 tablet    Refill:  0    Order Specific Question:   Supervising Provider    Answer:   Chase Picket [[id number]]   predniSONE (STERAPRED UNI-PAK 21 TAB) 10 MG (21) TBPK tablet    Sig: Take by mouth daily for 6 days. Take 6 tablets on day 1, 5 tablets on day 2, 4 tablets on day 3, 3 tablets on day 4, 2 tablets on day 5, 1 tablet on day 6    Dispense:  21 tablet    Refill:  0    Order Specific Question:   Supervising Provider    Answer:   Chase Picket [[id number]]    Acute Sinusitis Acute Bronchitis  Push fluids and get rest Prescribed amoxicillin 875mg  twice daily for 10 days Prescribed albuterol Prescribed prednisone Take as directed and to  completion.  Drink warm or cool liquids, use throat lozenges, or popsicles to help alleviate symptoms Take OTC ibuprofen or tylenol as needed for pain Follow up with PCP if symptoms persist Return or go to ER if you have any new or worsening symptoms such as fever, chills, nausea, vomiting, worsening sore throat, cough, abdominal pain, chest pain, changes in bowel or bladder habits.   Reviewed expectations re: course of current medical issues. Questions answered. Outlined signs and symptoms indicating need for more acute intervention. Patient verbalized understanding. After Visit Summary given.           Faustino Congress, NP 10/10/19 1402

## 2019-10-26 ENCOUNTER — Ambulatory Visit
Admission: EM | Admit: 2019-10-26 | Discharge: 2019-10-26 | Disposition: A | Discharge status: Home / Self Care | Payer: Medicaid Other | Attending: Emergency Medicine | Admitting: Emergency Medicine

## 2019-10-26 DIAGNOSIS — N898 Other specified noninflammatory disorders of vagina: Secondary | ICD-10-CM | POA: Insufficient documentation

## 2019-10-26 DIAGNOSIS — B379 Candidiasis, unspecified: Secondary | ICD-10-CM | POA: Diagnosis not present

## 2019-10-26 LAB — POCT URINALYSIS DIP (MANUAL ENTRY)
Bilirubin, UA: NEGATIVE
Glucose, UA: NEGATIVE mg/dL
Ketones, POC UA: NEGATIVE mg/dL
Nitrite, UA: NEGATIVE
Protein Ur, POC: NEGATIVE mg/dL
Spec Grav, UA: 1.025 (ref 1.010–1.025)
Urobilinogen, UA: 0.2 E.U./dL
pH, UA: 5.5 (ref 5.0–8.0)

## 2019-10-26 MED ORDER — FLUCONAZOLE 200 MG PO TABS: 200.0000 mg | ORAL_TABLET | Freq: Once | ORAL | 0 refills | Status: AC

## 2019-10-26 NOTE — Discharge Instructions (Signed)
Prescribed diflucan 200 mg once daily and then second dose 72 hours later Take medications as prescribed and to completion If tests results are positive, please abstain from sexual activity until you and your partner(s) have been treated Follow up with PCP or [facility] if symptoms persists Return here or go to ER if you have any new or worsening symptoms fever, chills, nausea, vomiting, abdominal or pelvic pain, painful intercourse, vaginal discharge, vaginal bleeding, persistent symptoms despite treatment, etc..Marland Kitchen

## 2019-10-26 NOTE — ED Provider Notes (Signed)
[place]   [id number] 10/26/19 Arrival Time: 1131   Chief Complaint  Patient presents with   Vaginal Discharge     SUBJECTIVE:  Dana Mcpherson is a 29 y.o. female presented to the urgent care with a complaint of vaginal irritation and odor worse after completing a course of antibiotic for the past 2 to 3 days.  Patient is not sexually active.  She has tried OTC Monistat without relief. Denies aggravating factors.  She reports similar symptoms in the past and was diagnosed with yeast infection and treated with Diflucan.  She denies fever, chills, nausea, vomiting, abdominal or pelvic pain, urinary symptoms, vaginal itching, vaginal bleeding, dyspareunia, vaginal rashes or lesions.   No LMP recorded. (Menstrual status: Oral contraceptives). Current birth control method: Compliant with BC:  ROS: As per HPI.  All other pertinent ROS negative.      Past Medical History:  Diagnosis Date   Allergy    Anxiety    Asthma    Complication of anesthesia    woke up during surgery & ithing after surgery   Family history of adverse reaction to anesthesia    "My mom has the same trouble with anesthesia".   GERD (gastroesophageal reflux disease)    Migraine headache    Nexplanon in place 10/05/2015   06/29/15 left arm   Polycystic ovarian syndrome    Pregnancy induced hypertension    Tachycardia    Vaginal Pap smear, abnormal    Past Surgical History:  Procedure Laterality Date   BIOPSY  06/19/2019   Procedure: BIOPSY;  Surgeon: Daneil Dolin, MD;  [facility];  Service: Endoscopy;;   CHOLECYSTECTOMY  2010   COLONOSCOPY WITH PROPOFOL N/A 06/19/2019   Procedure: COLONOSCOPY WITH PROPOFOL;  Surgeon: Daneil Dolin, MD; 1 tubular adenoma, otherwise normal-appearing colonic mucosa, normal TI.  Stool sample for C. difficile negative.  Recommended repeat colonoscopy in 7 years.   ESOPHAGOGASTRODUODENOSCOPY     ESOPHAGOGASTRODUODENOSCOPY (EGD) WITH  PROPOFOL N/A 04/08/2019   Procedure: ESOPHAGOGASTRODUODENOSCOPY (EGD) WITH PROPOFOL;  Surgeon: Daneil Dolin, MD; erosive reflux esophagitis s/p dilation, gastric mucosa congested diffusely with snakeskin appearance, antral erosions, congested eroded duodenal bulb, otherwise normal.   MALONEY DILATION N/A 04/08/2019   Procedure: Venia Minks DILATION;  Surgeon: Daneil Dolin, MD;  [facility]: [facility];  Service: Endoscopy;  Laterality: N/A;   POLYPECTOMY  06/19/2019   Procedure: POLYPECTOMY;  Surgeon: Daneil Dolin, MD;  [facility]: [facility];  Service: Endoscopy;;  cecal polyp   Allergies  Allergen Reactions   Other Anaphylaxis and Other (See Comments)    Pt states that she is allergic to Axe/Tag body spray and Lysol.    General anesthesia - pt woke up several times, was scratching during surgery. Itching   Allergy to sun and bug bites.   Shellfish Allergy Anaphylaxis and Hives   Adhesive [Tape] Hives   Eggs Or Egg-Derived Products Itching and Nausea Only   Latex Itching   No current facility-administered medications on file prior to encounter.   Current Outpatient Medications on File Prior to Encounter  Medication Sig Dispense Refill   albuterol (VENTOLIN HFA) 108 (90 Base) MCG/ACT inhaler Inhale 2 puffs into the lungs every 4 (four) hours as needed for wheezing or shortness of breath. 18 g 0   CORLANOR 5 MG TABS tablet TAKE 1 & 1/2 TABLETS BY MOUTH 2 TIMES A DAY WITH A MEAL. 120 tablet 1   DEXILANT 60 MG capsule Take 1  capsule by mouth daily.     Flaxseed, Linseed, (FLAXSEED OIL) 1200 MG CAPS Take 1,200 mg by mouth 2 (two) times daily.     levonorgestrel-ethinyl estradiol (SEASONALE) 0.15-0.03 MG tablet TAKE ONE TABLET BY MOUTH ONCE DAILY. 91 tablet 0   ondansetron (ZOFRAN) 4 MG tablet Take 1 tablet (4 mg total) by mouth every 8 (eight) hours as needed for nausea or vomiting. 30 tablet 1   Probiotic Product (RA PROBIOTIC GUMMIES) CHEW Chew 1 each by mouth in the  morning and at bedtime.     sucralfate (CARAFATE) 1 g tablet Take 1 tablet (1 g total) by mouth 4 (four) times daily -  with meals and at bedtime. 120 tablet 1   topiramate (TOPAMAX) 25 MG capsule Take 50 mg by mouth 2 (two) times daily.      Social History   Socioeconomic History   Marital status: Married    Spouse name: Not on file   Number of children: Not on file   Years of education: Not on file   Highest education level: Not on file  Occupational History   Not on file  Tobacco Use   Smoking status: Former Smoker    Packs/day: 0.25    Years: 2.00    Pack years: 0.50    Types: Cigarettes    Quit date: 04/04/2010    Years since quitting: 9.5   Smokeless tobacco: Never Used  Vaping Use   Vaping Use: Never used  Substance and Sexual Activity   Alcohol use: No   Drug use: No   Sexual activity: Yes    Birth control/protection: Pill  Other Topics Concern   Not on file  Social History Narrative   Not on file   Social Determinants of Health   Financial Resource Strain:    Difficulty of Paying Living Expenses:   Food Insecurity:    Worried About Charity fundraiser in the Last Year:    Arboriculturist in the Last Year:   Transportation Needs:    Film/video editor (Medical):    Lack of Transportation (Non-Medical):   Physical Activity:    Days of Exercise per Week:    Minutes of Exercise per Session:   Stress:    Feeling of Stress :   Social Connections:    Frequency of Communication with Friends and Family:    Frequency of Social Gatherings with Friends and Family:    Attends Religious Services:    Active Member of Clubs or Organizations:    Attends Music therapist:    Marital Status:   Intimate Partner Violence:    Fear of Current or Ex-Partner:    Emotionally Abused:    Physically Abused:    Sexually Abused:    Family History  Problem Relation Age of Onset   Cancer Paternal Grandmother        ovarian  cancer   Asthma Son    ADD / ADHD Son    Colon cancer Other        late 55's    OBJECTIVE:  Vitals:   10/26/19 1142  BP: 124/85  Pulse: 95  Resp: 20  Temp: 98 F (36.7 C)  SpO2: 96%     General appearance: Alert, NAD, appears stated age Head: NCAT Throat: lips, mucosa, and tongue normal; teeth and gums normal Lungs: CTA bilaterally without adventitious breath sounds Heart: regular rate and rhythm.  Radial pulses 2+ symmetrical bilaterally Back: no CVA tenderness Abdomen: soft,  non-tender; bowel sounds normal; no masses or organomegaly; no guarding or rebound tenderness GU: deferred Skin: warm and dry Psychological:  Alert and cooperative. Normal mood and affect.  LABS:  Results for orders placed or performed during the hospital encounter of 10/26/19  POCT urinalysis dipstick  Result Value Ref Range   Color, UA yellow yellow   Clarity, UA clear clear   Glucose, UA negative negative mg/dL   Bilirubin, UA negative negative   Ketones, POC UA negative negative mg/dL   Spec Grav, UA 1.025 1.010 - 1.025   Blood, UA small (A) negative   pH, UA 5.5 5.0 - 8.0   Protein Ur, POC negative negative mg/dL   Urobilinogen, UA 0.2 0.2 or 1.0 E.U./dL   Nitrite, UA Negative Negative   Leukocytes, UA Small (1+) (A) Negative    Labs Reviewed  POCT URINALYSIS DIP (MANUAL ENTRY) - Abnormal; Notable for the following components:      Result Value   Blood, UA small (*)    Leukocytes, UA Small (1+) (*)    All other components within normal limits  URINE CULTURE    ASSESSMENT & PLAN:  1. Vaginal irritation   2. Vaginal odor   3. Yeast infection     No orders of the defined types were placed in this encounter.   Pending: Labs Reviewed  POCT URINALYSIS DIP (MANUAL ENTRY) - Abnormal; Notable for the following components:      Result Value   Blood, UA small (*)    Leukocytes, UA Small (1+) (*)    All other components within normal limits  URINE CULTURE    Discharge  instructions Prescribed diflucan 200 mg once daily and then second dose 72 hours later Take medications as prescribed and to completion If tests results are positive, please abstain from sexual activity until you and your partner(s) have been treated Follow up with PCP or [facility] if symptoms persists Return here or go to ER if you have any new or worsening symptoms fever, chills, nausea, vomiting, abdominal or pelvic pain, painful intercourse, vaginal discharge, vaginal bleeding, persistent symptoms despite treatment, etc...  Reviewed expectations re: course of current medical issues. Questions answered. Outlined signs and symptoms indicating need for more acute intervention. Patient verbalized understanding. After Visit Summary given.    Note: This document was prepared using Dragon voice recognition software and may include unintentional dictation errors.    Emerson Monte, [place] 10/26/19 1218

## 2019-10-26 NOTE — ED Triage Notes (Signed)
Pt presents with c/o vaginal irritation after course of antibiotics , pt also has odorous

## 2019-10-27 LAB — URINE CULTURE: Culture: NO GROWTH

## 2019-10-29 ENCOUNTER — Ambulatory Visit
Admission: EM | Admit: 2019-10-29 | Discharge: 2019-10-29 | Disposition: A | Discharge status: Home / Self Care | Payer: Medicaid Other | Attending: Emergency Medicine | Admitting: Emergency Medicine

## 2019-10-29 ENCOUNTER — Encounter: Payer: Self-pay | Admitting: Emergency Medicine

## 2019-10-29 DIAGNOSIS — N939 Abnormal uterine and vaginal bleeding, unspecified: Secondary | ICD-10-CM

## 2019-10-29 NOTE — Discharge Instructions (Signed)
Vaginal bleeding may be secondary to diflucan use, previous yeast infection, break-through bleeding from contraceptives, ovarian cyst, fibroid, etc... We will check for infection today.  If results are normal please follow up with your GYN to discuss some of the other possible causes Vaginal self-swab obtained.  We will follow up with you regarding abnormal results If tests results are positive, please abstain from sexual activity until you and your partner(s) have been treated Follow up with PCP or GYN for recheck Return here or go to ER if you have any new or worsening symptoms fever, chills, nausea, vomiting, abdominal or pelvic pain, painful intercourse, vaginal discharge, vaginal bleeding, persistent symptoms despite treatment, etc..Marland Kitchen

## 2019-10-29 NOTE — ED Provider Notes (Signed)
Dana Mcpherson   [id number] 10/29/19 Arrival Time: 70   [id number] spotting  SUBJECTIVE:  Dana Mcpherson is a 29 y.o. female hx significant for PCOS, who presents with complaints of slight vaginal spotting that occurred 2 days ago.  Symptoms began after taking dose of diflucan for yeast infection.  Last sex a few months ago.  Denies aggravating factors.  Denies needing to use pads or tampons.  Denies similar symptoms in the past.  She denies fever, chills, nausea, vomiting, abdominal or pelvic pain, urinary symptoms, vaginal itching, vaginal odor, vaginal discharge, vaginal rashes or lesions.   No LMP recorded. (Menstrual status: Oral contraceptives). LMP 10/21/19; has a menstrual cycle every 3 months with [facility]  Took home pregnancy test this morning which was negative.    ROS: As per HPI.  All other pertinent ROS negative.     Past Medical History:  Diagnosis Date  . Allergy   . Anxiety   . Asthma   . Complication of anesthesia    woke up during surgery & ithing after surgery  . Family history of adverse reaction to anesthesia    "My mom has the same trouble with anesthesia".  . GERD (gastroesophageal reflux disease)   . Migraine headache   . Nexplanon in place 10/05/2015   06/29/15 left arm  . Polycystic ovarian syndrome   . Pregnancy induced hypertension   . Tachycardia   . Vaginal Pap smear, abnormal    Past Surgical History:  Procedure Laterality Date  . BIOPSY  06/19/2019   Procedure: BIOPSY;  Surgeon: Daneil Dolin, MD;  [facility];  Service: Endoscopy;;  . CHOLECYSTECTOMY  2010  . COLONOSCOPY WITH PROPOFOL N/A 06/19/2019   Procedure: COLONOSCOPY WITH PROPOFOL;  Surgeon: Daneil Dolin, MD; 1 tubular adenoma, otherwise normal-appearing colonic mucosa, normal TI.  Stool sample for C. difficile negative.  Recommended repeat colonoscopy in 7 years.  . ESOPHAGOGASTRODUODENOSCOPY    . ESOPHAGOGASTRODUODENOSCOPY (EGD) WITH PROPOFOL N/A 04/08/2019    Procedure: ESOPHAGOGASTRODUODENOSCOPY (EGD) WITH PROPOFOL;  Surgeon: Daneil Dolin, MD; erosive reflux esophagitis s/p dilation, gastric mucosa congested diffusely with snakeskin appearance, antral erosions, congested eroded duodenal bulb, otherwise normal.  . MALONEY DILATION N/A 04/08/2019   Procedure: Venia Minks DILATION;  Surgeon: Daneil Dolin, MD;  [facility];  Service: Endoscopy;  Laterality: N/A;  . POLYPECTOMY  06/19/2019   Procedure: POLYPECTOMY;  Surgeon: Daneil Dolin, MD;  [facility];  Service: Endoscopy;;  cecal polyp   Allergies  Allergen Reactions  . Other Anaphylaxis and Other (See Comments)    Pt states that she is allergic to Axe/Tag body spray and Lysol.    General anesthesia - pt woke up several times, was scratching during surgery. Itching   Allergy to sun and bug bites.  . Shellfish Allergy Anaphylaxis and Hives  . Adhesive [Tape] Hives  . Eggs Or Egg-Derived Products Itching and Nausea Only  . Latex Itching   No current facility-administered medications on file prior to encounter.   Current Outpatient Medications on File Prior to Encounter  Medication Sig Dispense Refill  . albuterol (VENTOLIN HFA) 108 (90 Base) MCG/ACT inhaler Inhale 2 puffs into the lungs every 4 (four) hours as needed for wheezing or shortness of breath. 18 g 0  . CORLANOR 5 MG TABS tablet TAKE 1 & 1/2 TABLETS BY MOUTH 2 TIMES A DAY WITH A MEAL. 120 tablet 1  . DEXILANT 60 MG capsule Take 1 capsule by mouth  daily.    . Flaxseed, Linseed, (FLAXSEED OIL) 1200 MG CAPS Take 1,200 mg by mouth 2 (two) times daily.    Marland Kitchen levonorgestrel-ethinyl estradiol (SEASONALE) 0.15-0.03 MG tablet TAKE ONE TABLET BY MOUTH ONCE DAILY. 91 tablet 0  . ondansetron (ZOFRAN) 4 MG tablet Take 1 tablet (4 mg total) by mouth every 8 (eight) hours as needed for nausea or vomiting. 30 tablet 1  . Probiotic Product (RA PROBIOTIC GUMMIES) CHEW Chew 1 each by mouth in the morning and at bedtime.    .  sucralfate (CARAFATE) 1 g tablet Take 1 tablet (1 g total) by mouth 4 (four) times daily -  with meals and at bedtime. 120 tablet 1  . topiramate (TOPAMAX) 25 MG capsule Take 50 mg by mouth 2 (two) times daily.      Social History   Socioeconomic History  . Marital status: Married    Spouse name: Not on file  . Number of children: Not on file  . Years of education: Not on file  . Highest education level: Not on file  Occupational History  . Not on file  Tobacco Use  . Smoking status: Former Smoker    Packs/day: 0.25    Years: 2.00    Pack years: 0.50    Types: Cigarettes    Quit date: 04/04/2010    Years since quitting: 9.5  . Smokeless tobacco: Never Used  Vaping Use  . Vaping Use: Never used  Substance and Sexual Activity  . Alcohol use: No  . Drug use: No  . Sexual activity: Yes    Birth control/protection: Pill  Other Topics Concern  . Not on file  Social History Narrative  . Not on file   Social Determinants of Health   Financial Resource Strain:   . Difficulty of Paying Living Expenses:   Food Insecurity:   . Worried About Charity fundraiser in the Last Year:   . Arboriculturist in the Last Year:   Transportation Needs:   . Film/video editor (Medical):   Marland Kitchen Lack of Transportation (Non-Medical):   Physical Activity:   . Days of Exercise per Week:   . Minutes of Exercise per Session:   Stress:   . Feeling of Stress :   Social Connections:   . Frequency of Communication with Friends and Family:   . Frequency of Social Gatherings with Friends and Family:   . Attends Religious Services:   . Active Member of Clubs or Organizations:   . Attends Archivist Meetings:   Marland Kitchen Marital Status:   Intimate Partner Violence:   . Fear of Current or Ex-Partner:   . Emotionally Abused:   Marland Kitchen Physically Abused:   . Sexually Abused:    Family History  Problem Relation Age of Onset  . Cancer Paternal Grandmother        ovarian cancer  . Asthma Son   . ADD  / ADHD Son   . Colon cancer Other        late 70's    OBJECTIVE:  Vitals:   10/29/19 1050  BP: (!) 152/84  Pulse: 75  Resp: 17  Temp: 97.9 F (36.6 C)  TempSrc: Oral  SpO2: 98%     General appearance: Alert, NAD, appears stated age Head: NCAT Eyes: EOMI grossly Throat: lips, mucosa, and tongue normal; teeth and gums normal Lungs: CTA bilaterally without adventitious breath sounds Heart: regular rate and rhythm.   Abdomen: soft, non-tender; bowel sounds normal;  no guarding GU: deferred Skin: warm and dry Psychological:  Alert and cooperative. Anxious mood and affect.  ASSESSMENT & PLAN:  1. Vaginal spotting     Vaginal bleeding may be secondary to diflucan use, previous yeast infection, break-through bleeding from contraceptives, ovarian cyst, fibroid, etc... We will check for infection today.  If results are normal please follow up with your GYN to discuss some of the other possible causes Vaginal self-swab obtained.  We will follow up with you regarding abnormal results If tests results are positive, please abstain from sexual activity until you and your partner(s) have been treated Follow up with PCP or GYN for recheck Return here or go to ER if you have any new or worsening symptoms fever, chills, nausea, vomiting, abdominal or pelvic pain, painful intercourse, vaginal discharge, vaginal bleeding, persistent symptoms despite treatment, etc...  Reviewed expectations re: course of current medical issues. Questions answered. Outlined signs and symptoms indicating need for more acute intervention. Patient verbalized understanding. After Visit Summary given.       Lestine Box, PA-C 10/29/19 1221

## 2019-10-29 NOTE — ED Triage Notes (Signed)
Provider seen prior to nurse assessment.

## 2019-10-30 LAB — CERVICOVAGINAL ANCILLARY ONLY
Bacterial Vaginitis (gardnerella): NEGATIVE
Candida Glabrata: NEGATIVE
Candida Vaginitis: NEGATIVE
Chlamydia: NEGATIVE
Comment: NEGATIVE
Comment: NEGATIVE
Comment: NEGATIVE
Comment: NEGATIVE
Comment: NEGATIVE
Comment: NORMAL
Neisseria Gonorrhea: NEGATIVE
Trichomonas: NEGATIVE

## 2019-11-11 ENCOUNTER — Telehealth: Payer: Self-pay | Admitting: Cardiovascular Disease

## 2019-11-11 MED ORDER — IVABRADINE HCL 5 MG PO TABS: ORAL_TABLET | ORAL | 1 refills | Status: DC

## 2019-11-11 NOTE — Telephone Encounter (Signed)
    Former patient of Dr. Bronson Ing. By review of notes she has been on Corlanor for inappropriate sinus tachycardia which is an off-label use of the medication. Intolerant to BB's in the past due to hypotension. Can provide refills for now. Looks like she has an appointment with Dr. Harl Bowie in 02/2020 to establish care with him.   Signed, Erma Heritage, PA-C 11/11/2019, 10:01 AM Pager: [phone]

## 2019-11-11 NOTE — Telephone Encounter (Signed)
I will defer to B Strader,PA-C to approve refill (former koneswaran pt)

## 2019-11-11 NOTE — Telephone Encounter (Signed)
Filled to [facility]

## 2019-11-11 NOTE — Telephone Encounter (Signed)
New message     *STAT* If patient is at the pharmacy, call can be transferred to refill team.   1. Which medications need to be refilled? (please list name of each medication and dose if known)  CORLANOR 5 MG TABS tablet TAKE 1 & 1/2 TABLETS BY MOUTH 2 TIMES A DAY WITH A MEAL.    2. Which pharmacy/location (including street and city if local pharmacy) is medication to be sent to? [place]  3. Do they need a 30 day or 90 day supply? [place]

## 2019-11-20 ENCOUNTER — Telehealth: Payer: Self-pay

## 2019-11-20 MED ORDER — IVABRADINE HCL 7.5 MG PO TABS: 7.5000 mg | ORAL_TABLET | Freq: Two times a day (BID) | ORAL | 11 refills | Status: DC

## 2019-11-20 NOTE — Telephone Encounter (Signed)
PA denied for 3 tablets daily but drug comes in 7.5 mg dose,so I sent new script in, patient aware.

## 2019-11-20 NOTE — Telephone Encounter (Signed)
Patient is medicaid now with managed care thru [facility], pt's policy number [id number],   case number [id number]   Corlanor quantity per day was what needed prior authorization. (2 tabs per day approved, but pt takes 3 tabs)   Is on Urgent review, they will give 24 hr response by fax.   [place]

## 2019-11-20 NOTE — Addendum Note (Signed)
Addended by: Barbarann Ehlers A on: 11/20/2019 04:58 PM   Modules accepted: Orders

## 2019-11-21 ENCOUNTER — Other Ambulatory Visit: Payer: Self-pay

## 2019-11-21 ENCOUNTER — Ambulatory Visit
Admission: EM | Admit: 2019-11-21 | Discharge: 2019-11-21 | Disposition: A | Discharge status: Home / Self Care | Payer: Medicaid Other | Attending: Emergency Medicine | Admitting: Emergency Medicine

## 2019-11-21 DIAGNOSIS — J3489 Other specified disorders of nose and nasal sinuses: Secondary | ICD-10-CM | POA: Diagnosis not present

## 2019-11-21 DIAGNOSIS — J019 Acute sinusitis, unspecified: Secondary | ICD-10-CM

## 2019-11-21 DIAGNOSIS — Z1152 Encounter for screening for COVID-19: Secondary | ICD-10-CM

## 2019-11-21 MED ORDER — AMOXICILLIN-POT CLAVULANATE 875-125 MG PO TABS: 1.0000 | ORAL_TABLET | Freq: Two times a day (BID) | ORAL | 0 refills | Status: AC

## 2019-11-21 NOTE — ED Triage Notes (Signed)
Pt presents with co sinus pressure and left ear pain and sinus drainage that began on Monday

## 2019-11-21 NOTE — ED Provider Notes (Signed)
[place]   [id number] 11/21/19 Arrival Time: 6301   CC: URI  SUBJECTIVE: History from: patient.  Dana Mcpherson is a 29 y.o. female who presents with maxillary sinus pain/ pressure and left ear pain x 4-5 days.  Denies sick exposure to COVID, flu or strep.  Has tried OTC medications without relief.  Denies aggravating factors.  Reports previous symptoms in the past.   Denies fever, chills, rhinorrhea, sore throat, cough, SOB, wheezing, chest pain, nausea, vomiting, changes in bowel or bladder habits.    ROS: As per HPI.  All other pertinent ROS negative.     Past Medical History:  Diagnosis Date   Allergy    Anxiety    Asthma    Complication of anesthesia    woke up during surgery & ithing after surgery   Family history of adverse reaction to anesthesia    "My mom has the same trouble with anesthesia".   GERD (gastroesophageal reflux disease)    Migraine headache    Nexplanon in place 10/05/2015   06/29/15 left arm   Polycystic ovarian syndrome    Pregnancy induced hypertension    Tachycardia    Vaginal Pap smear, abnormal    Past Surgical History:  Procedure Laterality Date   BIOPSY  06/19/2019   Procedure: BIOPSY;  Surgeon: Daneil Dolin, MD;  [facility];  Service: Endoscopy;;   CHOLECYSTECTOMY  2010   COLONOSCOPY WITH PROPOFOL N/A 06/19/2019   Procedure: COLONOSCOPY WITH PROPOFOL;  Surgeon: Daneil Dolin, MD; 1 tubular adenoma, otherwise normal-appearing colonic mucosa, normal TI.  Stool sample for C. difficile negative.  Recommended repeat colonoscopy in 7 years.   ESOPHAGOGASTRODUODENOSCOPY     ESOPHAGOGASTRODUODENOSCOPY (EGD) WITH PROPOFOL N/A 04/08/2019   Procedure: ESOPHAGOGASTRODUODENOSCOPY (EGD) WITH PROPOFOL;  Surgeon: Daneil Dolin, MD; erosive reflux esophagitis s/p dilation, gastric mucosa congested diffusely with snakeskin appearance, antral erosions, congested eroded duodenal bulb, otherwise normal.   MALONEY  DILATION N/A 04/08/2019   Procedure: Venia Minks DILATION;  Surgeon: Daneil Dolin, MD;  [facility];  Service: Endoscopy;  Laterality: N/A;   POLYPECTOMY  06/19/2019   Procedure: POLYPECTOMY;  Surgeon: Daneil Dolin, MD;  [facility];  Service: Endoscopy;;  cecal polyp   Allergies  Allergen Reactions   Other Anaphylaxis and Other (See Comments)    Pt states that she is allergic to Axe/Tag body spray and Lysol.    General anesthesia - pt woke up several times, was scratching during surgery. Itching   Allergy to sun and bug bites.   Shellfish Allergy Anaphylaxis and Hives   Adhesive [Tape] Hives   Eggs Or Egg-Derived Products Itching and Nausea Only   Latex Itching   No current facility-administered medications on file prior to encounter.   Current Outpatient Medications on File Prior to Encounter  Medication Sig Dispense Refill   albuterol (VENTOLIN HFA) 108 (90 Base) MCG/ACT inhaler Inhale 2 puffs into the lungs every 4 (four) hours as needed for wheezing or shortness of breath. 18 g 0   DEXILANT 60 MG capsule Take 1 capsule by mouth daily.     Flaxseed, Linseed, (FLAXSEED OIL) 1200 MG CAPS Take 1,200 mg by mouth 2 (two) times daily.     ivabradine (CORLANOR) 7.5 MG TABS tablet Take 1 tablet (7.5 mg total) by mouth 2 (two) times daily with a meal. 60 tablet 11   levonorgestrel-ethinyl estradiol (SEASONALE) 0.15-0.03 MG tablet TAKE ONE TABLET BY MOUTH ONCE DAILY. 91 tablet  0   ondansetron (ZOFRAN) 4 MG tablet Take 1 tablet (4 mg total) by mouth every 8 (eight) hours as needed for nausea or vomiting. 30 tablet 1   Probiotic Product (RA PROBIOTIC GUMMIES) CHEW Chew 1 each by mouth in the morning and at bedtime.     sucralfate (CARAFATE) 1 g tablet Take 1 tablet (1 g total) by mouth 4 (four) times daily -  with meals and at bedtime. 120 tablet 1   topiramate (TOPAMAX) 25 MG capsule Take 50 mg by mouth 2 (two) times daily.     Social History    Socioeconomic History   Marital status: Married    Spouse name: Not on file   Number of children: Not on file   Years of education: Not on file   Highest education level: Not on file  Occupational History   Not on file  Tobacco Use   Smoking status: Former Smoker    Packs/day: 0.25    Years: 2.00    Pack years: 0.50    Types: Cigarettes    Quit date: 04/04/2010    Years since quitting: 9.6   Smokeless tobacco: Never Used  Vaping Use   Vaping Use: Never used  Substance and Sexual Activity   Alcohol use: No   Drug use: No   Sexual activity: Yes    Birth control/protection: Pill  Other Topics Concern   Not on file  Social History Narrative   Not on file   Social Determinants of Health   Financial Resource Strain:    Difficulty of Paying Living Expenses:   Food Insecurity:    Worried About Charity fundraiser in the Last Year:    Arboriculturist in the Last Year:   Transportation Needs:    Film/video editor (Medical):    Lack of Transportation (Non-Medical):   Physical Activity:    Days of Exercise per Week:    Minutes of Exercise per Session:   Stress:    Feeling of Stress :   Social Connections:    Frequency of Communication with Friends and Family:    Frequency of Social Gatherings with Friends and Family:    Attends Religious Services:    Active Member of Clubs or Organizations:    Attends Music therapist:    Marital Status:   Intimate Partner Violence:    Fear of Current or Ex-Partner:    Emotionally Abused:    Physically Abused:    Sexually Abused:    Family History  Problem Relation Age of Onset   Cancer Paternal Grandmother        ovarian cancer   Asthma Son    ADD / ADHD Son    Colon cancer Other        late 38's    OBJECTIVE:  Vitals:   11/21/19 0912  BP: (!) 133/86  Pulse: 83  Resp: 18  Temp: 98 F (36.7 C)  SpO2: 97%    General appearance: alert; appears fatigued, but  nontoxic; speaking in full sentences and tolerating own secretions HEENT: NCAT; Ears: EACs clear, TMs pearly gray; Eyes: PERRL.  EOM grossly intact. Sinuses: TTP over maxillary sinuses; Nose: nares patent without rhinorrhea, Throat: oropharynx clear, tonsils non erythematous or enlarged, uvula midline  Neck: supple without LAD Lungs: unlabored respirations, symmetrical air entry; cough: absent; no respiratory distress; CTAB Heart: regular rate and rhythm.  Skin: warm and dry Psychological: alert and cooperative; normal mood and affect  ASSESSMENT &  PLAN:  1. Encounter for screening for COVID-19   2. Sinus pain   3. Acute non-recurrent sinusitis, unspecified location     Meds ordered this encounter  Medications   amoxicillin-clavulanate (AUGMENTIN) 875-125 MG tablet    Sig: Take 1 tablet by mouth every 12 (twelve) hours for 10 days.    Dispense:  20 tablet    Refill:  0    Order Specific Question:   Supervising Provider    Answer:   Raylene Everts [[id number]]   COVID testing ordered.  It will take between 2-5 days for test results.  Someone will contact you regarding abnormal results.    In the meantime: You should remain isolated in your home for 10 days from symptom onset AND greater than 72 hours after symptoms resolution (absence of fever without the use of fever-reducing medication and improvement in respiratory symptoms), whichever is longer Get plenty of rest and push fluids Augmentin prescribed.  Take as directed and to completion for sinus infection Use OTC zyrtec for nasal congestion, runny nose, and/or sore throat Use OTC flonase for nasal congestion and runny nose Use medications daily for symptom relief Use OTC medications like ibuprofen or tylenol as needed fever or pain Call or go to the ED if you have any new or worsening symptoms such as fever, cough, shortness of breath, chest tightness, chest pain, turning blue, changes in mental status, etc...   Reviewed  expectations re: course of current medical issues. Questions answered. Outlined signs and symptoms indicating need for more acute intervention. Patient verbalized understanding. After Visit Summary given.         Lestine Box, PA-C 11/21/19 [phone]

## 2019-11-21 NOTE — Discharge Instructions (Signed)
COVID testing ordered.  It will take between 2-5 days for test results.  Someone will contact you regarding abnormal results.    In the meantime: You should remain isolated in your home for 10 days from symptom onset AND greater than 72 hours after symptoms resolution (absence of fever without the use of fever-reducing medication and improvement in respiratory symptoms), whichever is longer Get plenty of rest and push fluids Augmentin prescribed.  Take as directed and to completion for sinus infection Use OTC zyrtec for nasal congestion, runny nose, and/or sore throat Use OTC flonase for nasal congestion and runny nose Use medications daily for symptom relief Use OTC medications like ibuprofen or tylenol as needed fever or pain Call or go to the ED if you have any new or worsening symptoms such as fever, cough, shortness of breath, chest tightness, chest pain, turning blue, changes in mental status, etc..Marland Kitchen

## 2019-11-22 LAB — NOVEL CORONAVIRUS, NAA: SARS-CoV-2, NAA: NOT DETECTED

## 2019-11-22 LAB — SARS-COV-2, NAA 2 DAY TAT

## 2019-12-06 ENCOUNTER — Encounter: Payer: Self-pay | Admitting: Internal Medicine

## 2019-12-06 ENCOUNTER — Ambulatory Visit: Payer: Medicaid Other | Admitting: Gastroenterology

## 2019-12-23 ENCOUNTER — Other Ambulatory Visit: Payer: Self-pay | Admitting: Adult Health

## 2020-01-09 ENCOUNTER — Other Ambulatory Visit: Payer: Medicaid Other

## 2020-01-20 ENCOUNTER — Other Ambulatory Visit: Payer: Self-pay | Admitting: Gastroenterology

## 2020-02-27 ENCOUNTER — Ambulatory Visit: Payer: Medicaid Other | Admitting: Cardiology

## 2020-02-27 ENCOUNTER — Telehealth: Payer: Medicaid Other | Admitting: Cardiovascular Disease

## 2020-03-03 ENCOUNTER — Encounter: Payer: Self-pay | Admitting: Cardiology

## 2020-03-03 ENCOUNTER — Other Ambulatory Visit: Payer: Self-pay

## 2020-03-03 ENCOUNTER — Ambulatory Visit: Payer: Medicaid Other | Admitting: Cardiology

## 2020-03-03 VITALS — BP 126/90 | HR 86 | Ht 66.5 in | Wt 235.0 lb

## 2020-03-03 DIAGNOSIS — R000 Tachycardia, unspecified: Secondary | ICD-10-CM

## 2020-03-03 DIAGNOSIS — E282 Polycystic ovarian syndrome: Secondary | ICD-10-CM

## 2020-03-03 NOTE — Assessment & Plan Note (Signed)
Chronic inappropriate tachycardia- worse with exercise

## 2020-03-03 NOTE — Progress Notes (Signed)
Cardiology Office Note:    Date:  03/03/2020   ID:  Dana Mcpherson, DOB [birth year], MRN [id number]  PCP:  Sharilyn Sites, MD  Cardiologist:  Kate Sable, MD (Inactive)  Electrophysiologist:  None   Referring MD: Sharilyn Sites, MD   No chief complaint on file.   History of Present Illness:    Dana Mcpherson is a pleasant 29 y.o. female with a hx of inappropriate tachycardia.  She also has an elevated sed rate and CRP.  The patient tells me her symptoms of inappropriate tachycardia started in 2016 after the birth of her son.  She tells me before this she was physically active and was a "runner".  Since she has had this issue with inappropriate tachycardia she has been unable to do any exercise.  She tells me early on it was so bad that she would pass out every time she tried exercise.  She has been placed on Corlanor as she was unable to tolerate Metoprolol secondary to hypotension.  Her resting tachycardia is improved.  Unfortunately she is unable to do any exercise.  She tells me her heart rate went up to 110 just walking into the clinic.  She has gradually gained weight, her BMI is now up to 37.  Past Medical History:  Diagnosis Date  . Allergy   . Anxiety   . Asthma   . Complication of anesthesia    woke up during surgery & ithing after surgery  . Family history of adverse reaction to anesthesia    "My mom has the same trouble with anesthesia".  . GERD (gastroesophageal reflux disease)   . Migraine headache   . Nexplanon in place 10/05/2015   06/29/15 left arm  . Polycystic ovarian syndrome   . Pregnancy induced hypertension   . Tachycardia   . Vaginal Pap smear, abnormal     Past Surgical History:  Procedure Laterality Date  . BIOPSY  06/19/2019   Procedure: BIOPSY;  Surgeon: Daneil Dolin, MD;  [facility];  Service: Endoscopy;;  . CHOLECYSTECTOMY  2010  . COLONOSCOPY WITH PROPOFOL N/A 06/19/2019   Procedure: COLONOSCOPY WITH PROPOFOL;  Surgeon: Daneil Dolin, MD; 1 tubular adenoma, otherwise normal-appearing colonic mucosa, normal TI.  Stool sample for C. difficile negative.  Recommended repeat colonoscopy in 7 years.  . ESOPHAGOGASTRODUODENOSCOPY    . ESOPHAGOGASTRODUODENOSCOPY (EGD) WITH PROPOFOL N/A 04/08/2019   Procedure: ESOPHAGOGASTRODUODENOSCOPY (EGD) WITH PROPOFOL;  Surgeon: Daneil Dolin, MD; erosive reflux esophagitis s/p dilation, gastric mucosa congested diffusely with snakeskin appearance, antral erosions, congested eroded duodenal bulb, otherwise normal.  . MALONEY DILATION N/A 04/08/2019   Procedure: Venia Minks DILATION;  Surgeon: Daneil Dolin, MD;  [facility];  Service: Endoscopy;  Laterality: N/A;  . POLYPECTOMY  06/19/2019   Procedure: POLYPECTOMY;  Surgeon: Daneil Dolin, MD;  [facility];  Service: Endoscopy;;  cecal polyp    Current Medications: Current Meds  Medication Sig  . albuterol (VENTOLIN HFA) 108 (90 Base) MCG/ACT inhaler Inhale 2 puffs into the lungs every 4 (four) hours as needed for wheezing or shortness of breath.  . DEXILANT 60 MG capsule TAKE (1) CAPSULE BY MOUTH EVERY DAY.  Marland Kitchen Flaxseed, Linseed, (FLAXSEED OIL) 1200 MG CAPS Take 1,200 mg by mouth 2 (two) times daily.  . ivabradine (CORLANOR) 7.5 MG TABS tablet Take 1 tablet (7.5 mg total) by mouth 2 (two) times daily with a meal.  . levonorgestrel-ethinyl estradiol (SEASONALE) 0.15-0.03 MG tablet TAKE  ONE TABLET BY MOUTH ONCE DAILY.  Marland Kitchen ondansetron (ZOFRAN) 4 MG tablet Take 1 tablet (4 mg total) by mouth every 8 (eight) hours as needed for nausea or vomiting.  . Probiotic Product (RA PROBIOTIC GUMMIES) CHEW Chew 1 each by mouth in the morning and at bedtime.  . topiramate (TOPAMAX) 25 MG capsule Take 50 mg by mouth 2 (two) times daily.     Allergies:   Other, Shellfish allergy, Adhesive [tape], Eggs or egg-derived products, and Latex   Social History   Socioeconomic History  . Marital status: Married    Spouse name: Not  on file  . Number of children: Not on file  . Years of education: Not on file  . Highest education level: Not on file  Occupational History  . Not on file  Tobacco Use  . Smoking status: Former Smoker    Packs/day: 0.25    Years: 2.00    Pack years: 0.50    Types: Cigarettes    Quit date: 04/04/2010    Years since quitting: 9.9  . Smokeless tobacco: Never Used  Vaping Use  . Vaping Use: Never used  Substance and Sexual Activity  . Alcohol use: No  . Drug use: No  . Sexual activity: Yes    Birth control/protection: Pill  Other Topics Concern  . Not on file  Social History Narrative  . Not on file   Social Determinants of Health   Financial Resource Strain:   . Difficulty of Paying Living Expenses: Not on file  Food Insecurity:   . Worried About Charity fundraiser in the Last Year: Not on file  . Ran Out of Food in the Last Year: Not on file  Transportation Needs:   . Lack of Transportation (Medical): Not on file  . Lack of Transportation (Non-Medical): Not on file  Physical Activity:   . Days of Exercise per Week: Not on file  . Minutes of Exercise per Session: Not on file  Stress:   . Feeling of Stress : Not on file  Social Connections:   . Frequency of Communication with Friends and Family: Not on file  . Frequency of Social Gatherings with Friends and Family: Not on file  . Attends Religious Services: Not on file  . Active Member of Clubs or Organizations: Not on file  . Attends Archivist Meetings: Not on file  . Marital Status: Not on file     Family History: The patient's family history includes ADD / ADHD in her son; Asthma in her son; Cancer in her paternal grandmother; Colon cancer in an other family member.  ROS:   Please see the history of present illness.     All other systems reviewed and are negative.  EKGs/Labs/Other Studies Reviewed:    The following studies were reviewed today:  Echo 09/09/2019- Study Conclusions   - Left  ventricle: The cavity size was normal. Wall thickness was  normal. Systolic function was normal. The estimated ejection  fraction was in the range of 60% to 65%. Images were inadequate  for LV wall motion assessment. No gross regional variation on  available images. Left ventricular diastolic function parameters  were normal.    EKG:  EKG is ordered today.  The ekg ordered today demonstrates NSR, HR 88  Recent Labs: 05/19/2019: ALT 64 06/14/2019: BUN 9; Creat 1.01; Hemoglobin 15.4; Platelets 375; Potassium 4.1; Sodium 138  Recent Lipid Panel No results found for: CHOL, TRIG, HDL, CHOLHDL, VLDL, LDLCALC, LDLDIRECT  Physical Exam:    VS:  BP 126/90   Pulse 86   Ht 5' 6.5" (1.689 m)   Wt 235 lb (106.6 kg)   SpO2 99%   BMI 37.36 kg/m     Wt Readings from Last 3 Encounters:  03/03/20 235 lb (106.6 kg)  09/12/19 225 lb (102.1 kg)  08/21/19 230 lb 6.4 oz (104.5 kg)     GEN: Overweight Caucasian female well developed in no acute distress HEENT: Normal NECK: No JVD; No carotid bruits CARDIAC: RRR, no murmurs, rubs, gallops RESPIRATORY:  Clear to auscultation without rales, wheezing or rhonchi  ABDOMEN: Soft, non-tender, non-distended MUSCULOSKELETAL:  No edema; No deformity  SKIN: Warm and dry NEUROLOGIC:  Alert and oriented x 3 PSYCHIATRIC:  Normal affect   ASSESSMENT:    Tachycardia Chronic inappropriate tachycardia- worse with exercise  PCOS (polycystic ovarian syndrome) .  Morbid obesity Her weight has been high going back to before the birth of her son though now she is approaching morbid obesity and understands the potential complications associated with this.  She attributes this to the inability to do any exercise.   PLAN:    I told her I would discuss her situation with EP to see if they had any input.  She would like to be able to exercise.  Im not sure how much of her tachycardia is secondary to being deconditioned and how much is inappropriate  tachycardia.  Either way she should have a dietary consult for obesity.    Medication Adjustments/Labs and Tests Ordered: Current medicines are reviewed at length with the patient today.  Concerns regarding medicines are outlined above.  No orders of the defined types were placed in this encounter.  No orders of the defined types were placed in this encounter.   Patient Instructions  Medication Instructions:  Your physician recommends that you continue on your current medications as directed. Please refer to the Current Medication list given to you today.  *If you need a refill on your cardiac medications before your next appointment, please call your pharmacy*   Lab Work: NONE   If you have labs (blood work) drawn today and your tests are completely normal, you will receive your results only by: Marland Kitchen MyChart Message (if you have MyChart) OR . A paper copy in the mail If you have any lab test that is abnormal or we need to change your treatment, we will call you to review the results.   Testing/Procedures: NONE    Follow-Up: At [facility], you and your health needs are our priority.  As part of our continuing mission to provide you with exceptional heart care, we have created designated Provider Care Teams.  These Care Teams include your primary Cardiologist (physician) and Advanced Practice Providers (APPs -  Physician Assistants and Nurse Practitioners) who all work together to provide you with the care you need, when you need it.  We recommend signing up for the patient portal called "MyChart".  Sign up information is provided on this After Visit Summary.  MyChart is used to connect with patients for Virtual Visits (Telemedicine).  Patients are able to view lab/test results, encounter notes, upcoming appointments, etc.  Non-urgent messages can be sent to your provider as well.   To learn more about what you can do with MyChart, go to [email or web address].    Your next  appointment:   6 month(s)  The format for your next appointment:   In Person  Provider:   Bernerd Pho,  PA-C   Other Instructions Thank you for choosing [facility]!       Signed, Kerin Ransom, PA-C  03/03/2020 11:59 AM    [facility]

## 2020-03-03 NOTE — Assessment & Plan Note (Signed)
Her weight has been high going back to before the birth of her son though now she is approaching morbid obesity and understands the potential complications associated with this.  She attributes this to the inability to do any exercise.

## 2020-03-03 NOTE — Patient Instructions (Signed)
Medication Instructions:  Your physician recommends that you continue on your current medications as directed. Please refer to the Current Medication list given to you today.  *If you need a refill on your cardiac medications before your next appointment, please call your pharmacy*   Lab Work: NONE   If you have labs (blood work) drawn today and your tests are completely normal, you will receive your results only by: . MyChart Message (if you have MyChart) OR . A paper copy in the mail If you have any lab test that is abnormal or we need to change your treatment, we will call you to review the results.   Testing/Procedures: NONE    Follow-Up: At [facility], you and your health needs are our priority.  As part of our continuing mission to provide you with exceptional heart care, we have created designated Provider Care Teams.  These Care Teams include your primary Cardiologist (physician) and Advanced Practice Providers (APPs -  Physician Assistants and Nurse Practitioners) who all work together to provide you with the care you need, when you need it.  We recommend signing up for the patient portal called "MyChart".  Sign up information is provided on this After Visit Summary.  MyChart is used to connect with patients for Virtual Visits (Telemedicine).  Patients are able to view lab/test results, encounter notes, upcoming appointments, etc.  Non-urgent messages can be sent to your provider as well.   To learn more about what you can do with MyChart, go to [email or web address].    Your next appointment:   6 month(s)  The format for your next appointment:   In Person  Provider:   Brittany Strader, PA-C   Other Instructions Thank you for choosing [facility]!    

## 2020-03-05 NOTE — Addendum Note (Signed)
Addended by: Levonne Hubert on: 03/05/2020 05:26 PM   Modules accepted: Orders

## 2020-03-09 NOTE — Progress Notes (Signed)
Referring Provider: Sharilyn Sites, MD Primary Care Physician:  Sharilyn Sites, MD Primary GI Physician: Dr. Gala Romney  Chief Complaint  Patient presents with  . Gastroesophageal Reflux    HPI:   Dana Mcpherson is a 29 y.o. female presenting today for follow-up with history of GERD, dysphagia, upper abdominal pain, diarrhea, rectal bleeding and elevated LFTs. EGD 04/08/19 with erosive reflux esophagitis s/p dilation, stomach with abnormal congestion diffusely with snakeskin appearance, antral erosions, patent pylorus, congested eroded duodenal bulb, otherwise normal.    No specimens collected.  Colonoscopy February 2021 with normal appearing TI, one tubular adenoma in cecum, segmental biopsies benign, stool sample submitted for C. Diff negative. Suspect rectal bleeding was trivial anorectal bleeding.  Due for repeat colonoscopy in 2028.    LFTs had been mildly elevated in 2010, normalized in 2012, but ALT became mildly elevated in July 2020 with persistent elevation. Extensive serologic evaluation has been completed including negative hepatitis A, B, and C, immune to Hep B, no immunity to hepatitis A, iron panel WNL, PT/INR normal, immunoglobulins, ANA, AMA, ASMA within normal limits.  No evidence of alpha-1 antitrypsin deficiency.  Ceruloplasmin elevated.  Follow-up 24-hour urine copper within normal limits. Abdominal ultrasound November 2020 unremarkable.  Prior ultrasound in January 2019 suggestive of hepatic steatosis. Last labs in January with ALT stable at 64, AST and alk phos wnl.   She was last seen in our office 08/21/19. GERD fairly well controlled on Dexilant. Upper abdominal pain improved somewhat with Dexilant. Had pain in the upper abdomen when bending over and stated cardiology was trying to determine if fluid retention was contributing. Had another flare of diarrhea with nausea about 4 weeks prior with worsening left sided abdominal pain and rectal bleeding lasting about 3 days. Queried  if undercooked meat was contributing to flares and had been avoiding steak and deli meats. Tried Bentyl and didn't feel this was helpful for flare. Has chronic loose stools since cholecystectomy and stated this was not bothering her.  Her primary concern was the days of severe diarrhea with abdominal pain and rectal bleeding.  Denied NSAIDs.  Regarding elevated LFTs, she had no signs or symptoms of decompensated liver disease.  States she cannot exercise due to tachycardia and she is seeing cardiology for this.  Plan to continue Dexilant, trial addition of Carafate 3 times daily and at bedtime, low-fat and lactose-free diet, keep a log of episodes of diarrhea/abdominal pain, evaluate for celiac disease, check fecal calprotectin, CRP, sed rate, H. pylori antibody IgG.  Telephone call 08/25/2019 with patient reporting nausea, diarrhea, and blood in the stool.  Reported she had a hemorrhoid.  She had a hotdog and hamburger prior to onset.  Queried red meat allergy and plan to add alpha gal to labs.  Celiac screen was negative.  CRP elevated at 43.5, sed rate elevated at 53, alpha gal panel negative.  Patient never had fecal calprotectin or H. pylori testing completed.  Today:   Diarrhea/rectal bleeding: Resolved with eliminating red meats and deli meats. Steak and pork chop are the worst. Can't remember when the last flare was. BMs daily. No black stools or brbpr.   Abdominal pain: Continues to feel she has some swelling in her upper abdomen and mild epigastric pain. Took Carafate for 3 moths without improvement. Abdominal pain can be worse after eating a large meal. Mild nausea. Feels she is getting full quickly. Has to wait for food to digest to finish her meal. No vomiting.  Doesn't  feel gassy. Not improved by gas-x.   GERD: Breakthrough GERD symptoms with eating and when she wakes up in the morning. Feels food wants to come back up from the night before. Taking Dexilant daily. Breakthrough symptoms  started about 2 months. She has been on omeprazole and pantoprazole previously. Spicy foods will worsen symptoms. Occasionally eating fried foods, eats out occasionally. Only drinking water. No NSAIDs.   Dysphagia: No trouble.   Elevated LFTs: No alcohol. No tylenol. Taking Topamax for headaches. No peripheral edema.   Can't exercise due to tachycardia.   Past Medical History:  Diagnosis Date  . Allergy   . Anxiety   . Asthma   . Complication of anesthesia    woke up during surgery & ithing after surgery  . Family history of adverse reaction to anesthesia    "My mom has the same trouble with anesthesia".  . GERD (gastroesophageal reflux disease)   . Migraine headache   . Nexplanon in place 10/05/2015   06/29/15 left arm  . Polycystic ovarian syndrome   . Pregnancy induced hypertension   . Tachycardia   . Vaginal Pap smear, abnormal     Past Surgical History:  Procedure Laterality Date  . BIOPSY  06/19/2019   Procedure: BIOPSY;  Surgeon: Daneil Dolin, MD;  Location: [facility];  Service: Endoscopy;;  . CHOLECYSTECTOMY  2010  . COLONOSCOPY WITH PROPOFOL N/A 06/19/2019   Procedure: COLONOSCOPY WITH PROPOFOL;  Surgeon: Daneil Dolin, MD; 1 tubular adenoma, otherwise normal-appearing colonic mucosa, normal TI.  Stool sample for C. difficile negative.  Recommended repeat colonoscopy in 7 years.  . ESOPHAGOGASTRODUODENOSCOPY    . ESOPHAGOGASTRODUODENOSCOPY (EGD) WITH PROPOFOL N/A 04/08/2019   Procedure: ESOPHAGOGASTRODUODENOSCOPY (EGD) WITH PROPOFOL;  Surgeon: Daneil Dolin, MD; erosive reflux esophagitis s/p dilation, gastric mucosa congested diffusely with snakeskin appearance, antral erosions, congested eroded duodenal bulb, otherwise normal.  . MALONEY DILATION N/A 04/08/2019   Procedure: Venia Minks DILATION;  Surgeon: Daneil Dolin, MD;  Location: [facility];  Service: Endoscopy;  Laterality: N/A;  . POLYPECTOMY  06/19/2019   Procedure: POLYPECTOMY;  Surgeon: Daneil Dolin, MD;  Location: [facility];  Service: Endoscopy;;  cecal polyp    Current Outpatient Medications  Medication Sig Dispense Refill  . albuterol (VENTOLIN HFA) 108 (90 Base) MCG/ACT inhaler Inhale 2 puffs into the lungs every 4 (four) hours as needed for wheezing or shortness of breath. 18 g 0  . Flaxseed, Linseed, (FLAXSEED OIL) 1200 MG CAPS Take 1,200 mg by mouth 2 (two) times daily.    . ivabradine (CORLANOR) 7.5 MG TABS tablet Take 1 tablet (7.5 mg total) by mouth 2 (two) times daily with a meal. 60 tablet 11  . levonorgestrel-ethinyl estradiol (SEASONALE) 0.15-0.03 MG tablet Take 1 tablet by mouth daily. 91 tablet 1  . ondansetron (ZOFRAN) 4 MG tablet Take 1 tablet (4 mg total) by mouth every 8 (eight) hours as needed for nausea or vomiting. 30 tablet 1  . Pediatric Vitamins (MULTIVITAMIN GUMMIES CHILDRENS PO) Take by mouth 2 (two) times daily.    Marland Kitchen topiramate (TOPAMAX) 25 MG capsule Take 50 mg by mouth 2 (two) times daily.    Marland Kitchen esomeprazole (NEXIUM) 40 MG capsule Take 1 capsule (40 mg total) by mouth 2 (two) times daily before a meal. 60 capsule 5   No current facility-administered medications for this visit.    Allergies as of 03/11/2020 - Review Complete 03/11/2020  Allergen Reaction Noted  . Other Anaphylaxis and  Other (See Comments) 08/30/2012  . Shellfish allergy Anaphylaxis and Hives 05/20/2013  . Adhesive [tape] Hives 10/10/2014  . Eggs or egg-derived products Itching and Nausea Only 07/05/2012  . Latex Itching 11/04/2014    Family History  Problem Relation Age of Onset  . Cancer Paternal Grandmother        ovarian cancer  . Asthma Son   . ADD / ADHD Son   . Colon cancer Other        late 32's    Social History   Socioeconomic History  . Marital status: Married    Spouse name: Not on file  . Number of children: Not on file  . Years of education: Not on file  . Highest education level: Not on file  Occupational History  . Not on file  Tobacco Use  .  Smoking status: Former Smoker    Packs/day: 0.25    Years: 2.00    Pack years: 0.50    Types: Cigarettes    Quit date: 04/04/2010    Years since quitting: 9.9  . Smokeless tobacco: Never Used  Vaping Use  . Vaping Use: Never used  Substance and Sexual Activity  . Alcohol use: No  . Drug use: No  . Sexual activity: Yes    Birth control/protection: Pill  Other Topics Concern  . Not on file  Social History Narrative  . Not on file   Social Determinants of Health   Financial Resource Strain:   . Difficulty of Paying Living Expenses: Not on file  Food Insecurity:   . Worried About Charity fundraiser in the Last Year: Not on file  . Ran Out of Food in the Last Year: Not on file  Transportation Needs:   . Lack of Transportation (Medical): Not on file  . Lack of Transportation (Non-Medical): Not on file  Physical Activity:   . Days of Exercise per Week: Not on file  . Minutes of Exercise per Session: Not on file  Stress:   . Feeling of Stress : Not on file  Social Connections:   . Frequency of Communication with Friends and Family: Not on file  . Frequency of Social Gatherings with Friends and Family: Not on file  . Attends Religious Services: Not on file  . Active Member of Clubs or Organizations: Not on file  . Attends Archivist Meetings: Not on file  . Marital Status: Not on file    Review of Systems: Gen: Denies fever, chills, cold or flulike symptoms, presyncope, syncope. CV: Denies chest pain.  Continues with increased heart rate with minimal activity. Resp: Admits to some shortness of breath especially with activity.  No cough. GI: See HPI. Heme: See HPI  Physical Exam: BP 118/75   Pulse 73   Temp 97.8 F (36.6 C) (Temporal)   Ht $R'5\' 7"'ok$  (1.702 m)   Wt 238 lb 9.6 oz (108.2 kg)   LMP 12/10/2019 (Approximate) Comment: has period every 3 months  BMI 37.37 kg/m  General:   Alert and oriented. No distress noted. Pleasant and cooperative.  Head:   Normocephalic and atraumatic. Eyes:  Conjuctiva clear without scleral icterus. Heart:  S1, S2 present without murmurs appreciated. Lungs:  Clear to auscultation bilaterally. No wheezes, rales, or rhonchi. No distress.  Abdomen:  +BS.  Abdomen is obese but nondistended and soft.  Mild tenderness to palpation in the epigastric area.  No rebound or guarding. No HSM or masses noted. Msk:  Symmetrical without gross deformities.  Normal posture. Extremities:  Without edema. Neurologic:  Alert and  oriented x4 Psych:  Normal mood and affect.

## 2020-03-10 ENCOUNTER — Telehealth: Payer: Self-pay | Admitting: *Deleted

## 2020-03-10 MED ORDER — LEVONORGEST-ETH ESTRAD 91-DAY 0.15-0.03 MG PO TABS: 1.0000 | ORAL_TABLET | Freq: Every day | ORAL | 1 refills | Status: DC

## 2020-03-10 NOTE — Telephone Encounter (Signed)
Refill OCs

## 2020-03-10 NOTE — Addendum Note (Signed)
Addended by: Derrek Monaco A on: 03/10/2020 04:54 PM   Modules accepted: Orders

## 2020-03-10 NOTE — Telephone Encounter (Signed)
Patient called stating the pharmacy told her to call us to request a refill on her prescription its for her birth control, patient states she gets a 3 month supply.   [employer]

## 2020-03-11 ENCOUNTER — Other Ambulatory Visit: Payer: Self-pay

## 2020-03-11 ENCOUNTER — Telehealth: Payer: Self-pay | Admitting: *Deleted

## 2020-03-11 ENCOUNTER — Other Ambulatory Visit: Payer: Self-pay | Admitting: *Deleted

## 2020-03-11 ENCOUNTER — Ambulatory Visit: Payer: Medicaid Other | Admitting: Gastroenterology

## 2020-03-11 ENCOUNTER — Encounter: Payer: Self-pay | Admitting: Gastroenterology

## 2020-03-11 ENCOUNTER — Encounter: Payer: Self-pay | Admitting: *Deleted

## 2020-03-11 VITALS — BP 118/75 | HR 73 | Temp 97.8°F | Ht 67.0 in | Wt 238.6 lb

## 2020-03-11 DIAGNOSIS — R1010 Upper abdominal pain, unspecified: Secondary | ICD-10-CM | POA: Diagnosis not present

## 2020-03-11 DIAGNOSIS — R7989 Other specified abnormal findings of blood chemistry: Secondary | ICD-10-CM | POA: Diagnosis not present

## 2020-03-11 DIAGNOSIS — K219 Gastro-esophageal reflux disease without esophagitis: Secondary | ICD-10-CM

## 2020-03-11 DIAGNOSIS — R197 Diarrhea, unspecified: Secondary | ICD-10-CM

## 2020-03-11 DIAGNOSIS — R6881 Early satiety: Secondary | ICD-10-CM

## 2020-03-11 MED ORDER — ESOMEPRAZOLE MAGNESIUM 40 MG PO CPDR: 40.0000 mg | DELAYED_RELEASE_CAPSULE | Freq: Two times a day (BID) | ORAL | 5 refills | Status: DC

## 2020-03-11 NOTE — Telephone Encounter (Signed)
PA is required for GES via [facility] website. PA started and requires clinical notes for clinical review. Ref# [id number]

## 2020-03-11 NOTE — Assessment & Plan Note (Addendum)
29 year old female with mildly elevated ALT without identified etiology, suspect fatty liver. LFTs mildly elevated in 2010, normalized in 2012, but ALT became mildly elevated in July 2020 with persistent elevation. Extensive serologic evaluation has been completed including negative hepatitis A, B, and C, immune to Hep B, no immunity to hepatitis A, iron panel WNL, PT/INR normal, immunoglobulins, ANA, AMA, ASMA within normal limits.  No evidence of alpha-1 antitrypsin deficiency.  Ceruloplasmin elevated.  Follow-up 24-hour urine copper within normal limits. Abdominal ultrasound November 2020 unremarkable.  Prior ultrasound in January 2019 suggestive of hepatic steatosis. Last labs in January with ALT stable at 64, AST and alk phos wnl. Denies alcohol, drug use, tylenol use, or OTC supplements. She reports intermittent upper abdominal swelling which has been ongoing for more than an year at this point with no ascites on prior US or CT. No LE edema or other signs or symptoms of decompensated liver disease. Unfortunately, she has gained about 13 lbs over the last 6 months. She is unable to exercise due to tachycardia which cardiology is evaluating.   Plan:  Update [facility] Instructions for fatty liver: Recommend 1-2# weight loss per week until ideal body weight through exercise & diet. Low fat/cholesterol diet.   Avoid sweets, sodas, fruit juices, sweetened beverages like tea, etc. Continue to avoid alcohol use. Continue to avoid tylenol and over the counter supplements.

## 2020-03-11 NOTE — Assessment & Plan Note (Addendum)
Chronic. Not adequately controlled on Dexilant 60 mg daily which had previously been working well. Having breakthrough symptoms daily after meals and when waking in the morning. Query delayed gastric emptying contributing to GERD symptoms as she reports mild upper abdominal pain, mild nausea, and early satiety.  Prior trial of Carafate was unhelpful.  Denies NSAIDs or melena.  EGD December 2020 with erosive reflux esophagitis s/p dilation, congested gastric mucosa with mixed in appearance, antral erosions, patent pylorus, congested over the duodenal bulb with no specimens collected. Can't rule out H. Pylori. Suspect BMI is also contributing.   Plan: Trial of Nexium 40 mg twice daily 30 minutes before breakfast and dinner. Counseled on GERD diet/lifestyle. Complete H. pylori IgG Gastric emptying study Follow-up in 3 months.

## 2020-03-11 NOTE — Assessment & Plan Note (Signed)
Addressed under abdominal pain.  

## 2020-03-11 NOTE — Assessment & Plan Note (Addendum)
History of intermittent episodes of significant diarrhea with associated abdominal pain, nausea, and rectal bleeding stating she was "pouring blood". Symptoms have been intermittent for about 1 year lasting a few days and resolve spontaneously. Prior evaluation with EGD December 2020 esophagitis, gastric mucosa with diffuse congestion, antral erosions, congested duodenal bulb, no specimens collected.  Colonoscopy February 2021 with 1 tubular adenoma, otherwise normal colonic mucosa normal TI.  Segmental colon biopsies were benign.  Stool sample submitted for C. difficile is negative. Suspected rectal bleeding was trivial. Celiac serologies.  CRP elevated at 43.5, sed rate elevated at 53, alpha gal panel negative. Interestingly, patient reports resolution of symptoms since eliminating red meats and deli meats.   Suspect patient may have a food allergy as the etiology of her symptoms.  Symptoms not consistent with Crohn's as symptoms have resolved with eliminating dietary triggers. Elevated ESR and CRP may have been in response to inflammatory/allergic response. Had considered capsule endoscopy but will hold off as symptoms have resolved. She will continue to avoid dietary triggers, and we will refer her to an allergist for additional allergy testing.

## 2020-03-11 NOTE — Patient Instructions (Signed)
Please have labs completed at [facility].  Please have gastric emptying study completed.   Stop Dexilant start Nexium 40 mg twice daily 30 minutes before breakfast and dinner.  Follow a GERD diet:  Avoid fried, fatty, greasy, spicy, citrus foods. Avoid caffeine and carbonated beverages. Avoid chocolate. Try eating 4-6 small meals a day rather than 3 large meals. Do not eat within 3 hours of laying down. Prop head of bed up on wood or bricks to create a 6 inch incline.  Instructions for elevated liver function test: Recommend 1-2# weight loss per week until ideal body weight through exercise & diet. Low fat/cholesterol diet.   Avoid sweets, sodas, fruit juices, sweetened beverages like tea, etc. Continue to avoid alcohol Continue to avoid Tylenol Start over-the-counter supplements.  Continue avoiding known dietary triggers of diarrhea and rectal bleeding.  We will refer you to an allergist for additional evaluation.   We will follow-up with you in 3 months. Please call with questions or concerns prior.   Aliene Altes, PA-C [facility]

## 2020-03-11 NOTE — Telephone Encounter (Signed)
Clinicals faxed

## 2020-03-11 NOTE — Assessment & Plan Note (Addendum)
Patient continues with mild upper abdominal discomfort that has been present for at least 1 year worsened with eating larger meals.  Associated mild nausea without vomiting as well as early satiety.  Also reports intermittent swelling in her upper abdomen which has also been going on for at least a year. Prior evaluation with EGD December 2020 with erosive reflux esophagitis s/p dilation, stomach with abnormal congestion diffusely with snakeskin appearance, antral erosions, patent pylorus, congested eroded duodenal bulb, no specimens collected.  Celiac serologies negative.  CT A/P with contrast January 2021 with no acute findings.  Prior trial of Carafate unhelpful. She does have history of GERD and did note some improvement in abdominal discomfort when GERD was well controlled.  However, over the last couple of months, Dexilant is no longer working well and she is currently having GERD symptoms daily after meals and in the morning.  Denies NSAIDs or melena.  Abdominal exam with mild tenderness to palpation in the epigastric area.  Symptoms may be influenced by uncontrolled GERD.  Additional differentials include H. pylori, delayed gastric emptying, gastritis, duodenitis.  Additionally, she reports cardiology is also evaluating this with question of fluid retention secondary to inappropriate tachycardia.  Plan: Start Dexilant and try Nexium 40 mg twice daily. H. pylori IgG GES Avoid fried, fatty, greasy, spicy, citrus foods. Avoid caffeine and carbonated beverages. Avoid chocolate. Try eating 4-6 small meals a day rather than 3 large meals. Do not eat within 3 hours of laying down. Prop head of bed up on wood or bricks to create a 6 inch incline. Follow-up in 3 months.  Call with questions or concerns prior.

## 2020-03-16 NOTE — Telephone Encounter (Signed)
PA approved. Auth# [id number] valid until 04/27/2020

## 2020-03-17 LAB — HEPATIC FUNCTION PANEL
ALT: 47 IU/L — ABNORMAL HIGH (ref 0–32)
AST: 23 IU/L (ref 0–40)
Albumin: 4.4 g/dL (ref 3.8–4.8)
Alkaline Phosphatase: 93 IU/L (ref 44–121)
Bilirubin Total: 0.3 mg/dL (ref 0.0–1.2)
Bilirubin, Direct: <0.1 mg/dL (ref 0.00–0.40)
Total Protein: 7.4 g/dL (ref 6.0–8.5)

## 2020-03-17 LAB — H. PYLORI ANTIBODY, IGG: H. pylori, IgG AbS: 0.17 Index Value (ref 0.00–0.79)

## 2020-03-18 ENCOUNTER — Encounter (HOSPITAL_COMMUNITY): Payer: Medicaid Other

## 2020-03-22 NOTE — Progress Notes (Signed)
H. Pylori testing is negative. ALT (liver enzyme) remains elevated but is down to 47 from 64 in January 2021.   She should continue with fatty liver recommendations made at [facility].  We will follow-up on gastric emptying study once she has this completed.

## 2020-03-25 ENCOUNTER — Encounter (HOSPITAL_COMMUNITY): Admission: RE | Admit: 2020-03-25 | Payer: Medicaid Other | Source: Ambulatory Visit

## 2020-04-01 ENCOUNTER — Encounter (HOSPITAL_COMMUNITY)
Admission: RE | Admit: 2020-04-01 | Discharge: 2020-04-01 | Disposition: A | Discharge status: Home / Self Care | Payer: Medicaid Other | Source: Ambulatory Visit | Attending: Gastroenterology | Admitting: Gastroenterology

## 2020-04-01 ENCOUNTER — Other Ambulatory Visit: Payer: Self-pay

## 2020-04-01 ENCOUNTER — Encounter (HOSPITAL_COMMUNITY): Payer: Self-pay

## 2020-04-01 DIAGNOSIS — R6881 Early satiety: Secondary | ICD-10-CM | POA: Diagnosis present

## 2020-04-01 IMAGING — NM NM GASTRIC EMPTYING
6 series · 6 of 6 positions shown · non-contrast
Comparison: None

CLINICAL DATA: Early satiety, nausea, vomiting, abdominal pain,
bloating, and reflux for approximately 3 years

EXAM:
NUCLEAR MEDICINE GASTRIC EMPTYING SCAN
TECHNIQUE: After oral ingestion of radiolabeled meal, sequential abdominal
images were obtained for 120 minutes. Residual percentage of
activity remaining within the stomach was calculated at 60 and 120
minutes.
RADIOPHARMACEUTICALS:  2.2 mCi [45] sulfur colloid in standardized
meal of oatmeal; patient has an egg allergy

[Series 1: 0 min · 4.14mm/px · 1 of 1 slices shown (1 of 4)]
[im 1/1]
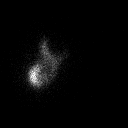

[Series 1: 0 min · 4.14mm/px · 1 of 1 slices shown (2 of 4)]
[im 1/1]
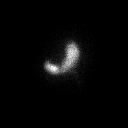

[Series 1: 0 min · 4.14mm/px · 1 of 1 slices shown (3 of 4)]
[im 1/1]
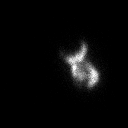

[Series 1: 0 min · 4.14mm/px · 1 of 1 slices shown (4 of 4)]
[im 1/1]
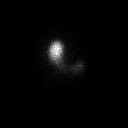

[Series 2: 120 min · 4.14mm/px · 1 of 1 slices shown (1 of 2)]
[im 1/1  full-range]
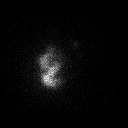

[Series 2: 120 min · 4.14mm/px · 1 of 1 slices shown (2 of 2)]
[im 1/1]
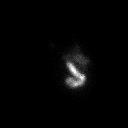

[6 of 6 positions shown; findings below may reference images not displayed]

FINDINGS: Expected location of the stomach in the left upper quadrant.

Tracer empties the stomach normally over 2 hours.

52% emptying at 1 hour.

99% emptying at 2 hours.

Calculated half-time of gastric emptying is 60 minutes.

Findings represent normal gastric emptying.
IMPRESSION: Normal gastric emptying study.

## 2020-04-01 MED ORDER — TECHNETIUM TC 99M SULFUR COLLOID
2.0000 | Freq: Once | INTRAVENOUS | Status: AC | PRN
  Administered 2020-04-01: 2.2 via ORAL

## 2020-05-06 ENCOUNTER — Ambulatory Visit: Payer: Self-pay | Admitting: Allergy & Immunology

## 2020-05-14 ENCOUNTER — Encounter: Payer: Self-pay | Admitting: Emergency Medicine

## 2020-05-14 ENCOUNTER — Ambulatory Visit
Admission: EM | Admit: 2020-05-14 | Discharge: 2020-05-14 | Disposition: A | Discharge status: Home / Self Care | Payer: Medicaid Other | Attending: Emergency Medicine | Admitting: Emergency Medicine

## 2020-05-14 ENCOUNTER — Other Ambulatory Visit: Payer: Self-pay

## 2020-05-14 DIAGNOSIS — R519 Headache, unspecified: Secondary | ICD-10-CM

## 2020-05-14 DIAGNOSIS — R5383 Other fatigue: Secondary | ICD-10-CM

## 2020-05-14 DIAGNOSIS — U099 Post covid-19 condition, unspecified: Secondary | ICD-10-CM

## 2020-05-14 DIAGNOSIS — R053 Chronic cough: Secondary | ICD-10-CM | POA: Diagnosis not present

## 2020-05-14 MED ORDER — BENZONATATE 100 MG PO CAPS: 100.0000 mg | ORAL_CAPSULE | Freq: Four times a day (QID) | ORAL | 0 refills | Status: DC | PRN

## 2020-05-14 MED ORDER — ALBUTEROL SULFATE HFA 108 (90 BASE) MCG/ACT IN AERS
1.0000 | INHALATION_SPRAY | Freq: Four times a day (QID) | RESPIRATORY_TRACT | 0 refills | Status: DC | PRN
Start: 2020-05-14 — End: 2020-10-16

## 2020-05-14 MED ORDER — DEXAMETHASONE 4 MG PO TABS: 4.0000 mg | ORAL_TABLET | Freq: Every day | ORAL | 0 refills | Status: AC

## 2020-05-14 NOTE — ED Provider Notes (Signed)
[facility]   [id number] 05/14/20 Arrival Time: 1837   CC: COVID symptoms  SUBJECTIVE: History from: patient.  Dana Mcpherson is a 30 y.o. female who presented to the urgent care for complaint of post-COVID 2 weeks ago and is having cough fatigue headache.  Denies sick exposure to flu or strep.  Denies recent travel.  Has tried OTC medication without relief.  Denies review aggravating factors.  Denies previous symptoms in the past.   Denies fever, chills, fatigue, sinus pain, rhinorrhea, sore throat, SOB, wheezing, chest pain, nausea, changes in bowel or bladder habits.     ROS: As per HPI.  All other pertinent ROS negative.     Past Medical History:  Diagnosis Date  . Allergy   . Anxiety   . Asthma   . Complication of anesthesia    woke up during surgery & ithing after surgery  . Family history of adverse reaction to anesthesia    "My mom has the same trouble with anesthesia".  . GERD (gastroesophageal reflux disease)   . Migraine headache   . Nexplanon in place 10/05/2015   06/29/15 left arm  . Polycystic ovarian syndrome   . Pregnancy induced hypertension   . Tachycardia   . Vaginal Pap smear, abnormal    Past Surgical History:  Procedure Laterality Date  . BIOPSY  06/19/2019   Procedure: BIOPSY;  Surgeon: Daneil Dolin, MD;  Location: [facility];  Service: Endoscopy;;  . CHOLECYSTECTOMY  2010  . COLONOSCOPY WITH PROPOFOL N/A 06/19/2019   Procedure: COLONOSCOPY WITH PROPOFOL;  Surgeon: Daneil Dolin, MD; 1 tubular adenoma, otherwise normal-appearing colonic mucosa, normal TI.  Stool sample for C. difficile negative.  Recommended repeat colonoscopy in 7 years.  . ESOPHAGOGASTRODUODENOSCOPY    . ESOPHAGOGASTRODUODENOSCOPY (EGD) WITH PROPOFOL N/A 04/08/2019   Procedure: ESOPHAGOGASTRODUODENOSCOPY (EGD) WITH PROPOFOL;  Surgeon: Daneil Dolin, MD; erosive reflux esophagitis s/p dilation, gastric mucosa congested diffusely with snakeskin appearance, antral  erosions, congested eroded duodenal bulb, otherwise normal.  . MALONEY DILATION N/A 04/08/2019   Procedure: Venia Minks DILATION;  Surgeon: Daneil Dolin, MD;  Location: [facility];  Service: Endoscopy;  Laterality: N/A;  . POLYPECTOMY  06/19/2019   Procedure: POLYPECTOMY;  Surgeon: Daneil Dolin, MD;  Location: [facility];  Service: Endoscopy;;  cecal polyp   Allergies  Allergen Reactions  . Other Anaphylaxis and Other (See Comments)    Pt states that she is allergic to Axe/Tag body spray and Lysol.    General anesthesia - pt woke up several times, was scratching during surgery. Itching   Allergy to sun and bug bites.  . Shellfish Allergy Anaphylaxis and Hives  . Adhesive [Tape] Hives  . Eggs Or Egg-Derived Products Itching and Nausea Only  . Latex Itching   No current facility-administered medications on file prior to encounter.   Current Outpatient Medications on File Prior to Encounter  Medication Sig Dispense Refill  . esomeprazole (NEXIUM) 40 MG capsule Take 1 capsule (40 mg total) by mouth 2 (two) times daily before a meal. 60 capsule 5  . Flaxseed, Linseed, (FLAXSEED OIL) 1200 MG CAPS Take 1,200 mg by mouth 2 (two) times daily.    . ivabradine (CORLANOR) 7.5 MG TABS tablet Take 1 tablet (7.5 mg total) by mouth 2 (two) times daily with a meal. 60 tablet 11  . levonorgestrel-ethinyl estradiol (SEASONALE) 0.15-0.03 MG tablet Take 1 tablet by mouth daily. 91 tablet 1  . ondansetron (ZOFRAN) 4 MG tablet Take  1 tablet (4 mg total) by mouth every 8 (eight) hours as needed for nausea or vomiting. 30 tablet 1  . Pediatric Vitamins (MULTIVITAMIN GUMMIES CHILDRENS PO) Take by mouth 2 (two) times daily.    Marland Kitchen topiramate (TOPAMAX) 25 MG capsule Take 50 mg by mouth 2 (two) times daily.     Social History   Socioeconomic History  . Marital status: Married    Spouse name: Not on file  . Number of children: Not on file  . Years of education: Not on file  . Highest education level:  Not on file  Occupational History  . Not on file  Tobacco Use  . Smoking status: Former Smoker    Packs/day: 0.25    Years: 2.00    Pack years: 0.50    Types: Cigarettes    Quit date: 04/04/2010    Years since quitting: 10.1  . Smokeless tobacco: Never Used  Vaping Use  . Vaping Use: Never used  Substance and Sexual Activity  . Alcohol use: No  . Drug use: No  . Sexual activity: Yes    Birth control/protection: Pill  Other Topics Concern  . Not on file  Social History Narrative  . Not on file   Social Determinants of Health   Financial Resource Strain: Not on file  Food Insecurity: Not on file  Transportation Needs: Not on file  Physical Activity: Not on file  Stress: Not on file  Social Connections: Not on file  Intimate Partner Violence: Not on file   Family History  Problem Relation Age of Onset  . Cancer Paternal Grandmother        ovarian cancer  . Asthma Son   . ADD / ADHD Son   . Colon cancer Other        late 70's    OBJECTIVE:  Vitals:   05/14/20 1926  BP: 140/85  Pulse: 92  Resp: 15  Temp: 97.6 F (36.4 C)  SpO2: 98%     General appearance: alert; appears fatigued, but nontoxic; speaking in full sentences and tolerating own secretions HEENT: NCAT; Ears: EACs clear, TMs pearly gray; Eyes: PERRL.  EOM grossly intact. Sinuses: nontender; Nose: nares patent without rhinorrhea, Throat: oropharynx clear, tonsils non erythematous or enlarged, uvula midline  Neck: supple without LAD Lungs: unlabored respirations, symmetrical air entry; cough: moderate; no respiratory distress; CTAB Heart: regular rate and rhythm.  Radial pulses 2+ symmetrical bilaterally Skin: warm and dry Psychological: alert and cooperative; normal mood and affect  LABS:  No results found for this or any previous visit (from the past 24 hour(s)).   ASSESSMENT & PLAN:  1. Post-COVID chronic cough   2. Other fatigue   3. Acute intractable headache, unspecified headache type      Meds ordered this encounter  Medications  . benzonatate (TESSALON) 100 MG capsule    Sig: Take 1 capsule (100 mg total) by mouth 4 (four) times daily as needed for cough.    Dispense:  30 capsule    Refill:  0  . albuterol (VENTOLIN HFA) 108 (90 Base) MCG/ACT inhaler    Sig: Inhale 1-2 puffs into the lungs every 6 (six) hours as needed.    Dispense:  18 g    Refill:  0  . dexamethasone (DECADRON) 4 MG tablet    Sig: Take 1 tablet (4 mg total) by mouth daily for 7 days.    Dispense:  7 tablet    Refill:  0    Discharge  instructions  Tessalon Perles prescribed for cough Decadron as prescribed ProAir was prescribed Use medications daily for symptom relief Use OTC medications like ibuprofen or tylenol as needed fever or pain Call or go to the ED if you have any new or worsening symptoms such as fever, worsening cough, shortness of breath, chest tightness, chest pain, turning blue, changes in mental status, etc...   Reviewed expectations re: course of current medical issues. Questions answered. Outlined signs and symptoms indicating need for more acute intervention. Patient verbalized understanding. After Visit Summary given.         Emerson Monte, FNP 05/14/20 2009

## 2020-05-14 NOTE — Discharge Instructions (Signed)
Tessalon Perles prescribed for cough Decadron as prescribed ProAir was prescribed Use medications daily for symptom relief Use OTC medications like ibuprofen or tylenol as needed fever or pain Call or go to the ED if you have any new or worsening symptoms such as fever, worsening cough, shortness of breath, chest tightness, chest pain, turning blue, changes in mental status, etc..

## 2020-05-14 NOTE — ED Triage Notes (Signed)
Patient states that she covid x 3 weeks ago, still having some fatigue and headaches, slight dry cough

## 2020-05-20 ENCOUNTER — Ambulatory Visit: Payer: Self-pay | Admitting: Allergy & Immunology

## 2020-06-10 ENCOUNTER — Encounter: Payer: Self-pay | Admitting: Allergy & Immunology

## 2020-06-10 ENCOUNTER — Ambulatory Visit (INDEPENDENT_AMBULATORY_CARE_PROVIDER_SITE_OTHER): Payer: 59 | Admitting: Allergy & Immunology

## 2020-06-10 ENCOUNTER — Other Ambulatory Visit: Payer: Self-pay

## 2020-06-10 VITALS — BP 130/82 | HR 82 | Temp 98.6°F | Resp 18 | Ht 67.52 in | Wt 248.6 lb

## 2020-06-10 DIAGNOSIS — J4520 Mild intermittent asthma, uncomplicated: Secondary | ICD-10-CM

## 2020-06-10 DIAGNOSIS — J310 Chronic rhinitis: Secondary | ICD-10-CM

## 2020-06-10 DIAGNOSIS — T7800XA Anaphylactic reaction due to unspecified food, initial encounter: Secondary | ICD-10-CM

## 2020-06-10 DIAGNOSIS — T7800XD Anaphylactic reaction due to unspecified food, subsequent encounter: Secondary | ICD-10-CM | POA: Diagnosis not present

## 2020-06-10 DIAGNOSIS — J3089 Other allergic rhinitis: Secondary | ICD-10-CM | POA: Diagnosis not present

## 2020-06-10 DIAGNOSIS — L508 Other urticaria: Secondary | ICD-10-CM | POA: Diagnosis not present

## 2020-06-10 MED ORDER — EPINEPHRINE 0.3 MG/0.3ML IJ SOAJ: 0.3000 mg | Freq: Once | INTRAMUSCULAR | 1 refills | Status: DC | PRN

## 2020-06-10 NOTE — Progress Notes (Signed)
NEW PATIENT  Date of Service/Encounter:  06/10/20  Referring provider: Sharilyn Sites, MD   Assessment:   Mild intermittent asthma, uncomplicated  Chronic rhinitis  Anaphylactic shock due to food  Chronic urticaria  Tachycardia  Full vaccinated with Moderna (NEEDS booster)  Plan/Recommendations:   1. Mild intermittent asthma, uncomplicated - Lung testing looked fairly good today. - I do not think that we need to add any controller medications at this time.  - Continue with albuterol 4 puffs every every 4-6 hours as needed.   2. Chronic rhinitis - Testing was negative to the entire panel aside from dust mites.  - Copy of testing results provided. - Continue with you antihistamines as below (for the urticaria/hives).  - We are going to confirm this with blood work.   3. Anaphylactic shock due to food - Testing to the entire food panel was negative. - We are confirming the egg and shellfish since these reactions were so severe. - We will call you in 1-2 weeks with the results of the testing. - EpiPen sent in. - Anaphylaxis management plan provided.   4. Chronic urticaria - Your history does not have any "red flags" such as fevers, joint pains, or permanent skin changes that would be concerning for a more serious cause of hives.  - We will get some labs to rule out serious causes of hives: complete blood count, tryptase level, chronic urticaria panel, CMP, ESR, and CRP. - Chronic hives are often times a self limited process and will "burn themselves out" over 6-12 months, although this is not always the case.  - In the meantime, start suppressive dosing of antihistamines:   - Morning: Zyrtec (cetirizine) 10-20mg  (two tablets)  - Evening: Zyrtec (cetirizine) 10-20mg  (two tablets)  - If the above is not working, try adding: Pepcid (famotidine) 20mg  (up to twice daily) - You can change this dosing at home, decreasing the dose as needed or increasing the dosing as needed.    5. Return in about 4 weeks (around 07/08/2020).    Subjective:   Dana Mcpherson is a 30 y.o. female presenting today for evaluation of  Chief Complaint  Patient presents with  . Allergy Testing    Dana Mcpherson has a history of the following: Patient Active Problem List   Diagnosis Date Noted  . Mild intermittent asthma, uncomplicated 23/55/7322  . Perennial allergic rhinitis 06/12/2020  . Anaphylactic shock due to adverse food reaction 06/12/2020  . Chronic urticaria 06/12/2020  . Early satiety 03/11/2020  . Abdominal pain 05/24/2019  . Abdominal bloating 02/20/2019  . Elevated LFTs 02/20/2019  . Gastroesophageal reflux disease 02/20/2019  . Dysphagia 02/20/2019  . Diarrhea 02/20/2019  . Atypical pneumonia 02/19/2016  . Pulmonary infiltrates 02/18/2016  . Hemorrhoid 06/29/2015  . Shortness of breath 11/04/2014  . Tachycardia 10/07/2014  . ASCUS with positive high risk HPV 05/28/2014  . PCOS (polycystic ovarian syndrome) 03/03/2014  . Hyperinsulinemia 03/03/2014  . Morbid obesity ([place]) 05/23/2013  . DUB (dysfunctional uterine bleeding) 05/23/2013    History obtained from: chart review and patient.  Dana N Drennan was referred by Sharilyn Sites, MD.     Dana is a 30 y.o. female presenting for an evaluation of multiple food allergies. However, she has a host of other atopic complaints as well.  She has a history of getting diarrhea and dehydration with bloody stools. She would get sick for one month. She had a colonoscopy and endoscopy. She was evaluated for Crohn's disease.  She became sick at some point. She had testing to look for alpha gal and this was negative. But once she stopped eating steak, she stopped getting sick. She stopped eating all red meat and things stopped. This was May 2021. This was all managed by her GI doctor at [facility], [place]).    She has a known history of anaphylaxis to shellfish. She was taken to the hospital at age 67  for anaphylaxis. She can "smell" fish and her skin will break out in hives.    After she had her son was born, her 52yo husband had a heart attack. He had a stent placed and has done well. Regardless, she started eating salmon to help with his diet and she took away all fish together. She was told to stop eating the fish entirely.   She develops itching and facial hives with egg exposure. This seems to be only stovetop eggs. She tolerates baked eggs. She still eats eggs and she still has the itching and the hives. She knows she is allergic but she takes Benadryl.   She has a son who is allergic to eggs as well. Her son is now 55 and is still avoiding eggs.    Allergic Rhinitis Symptom History: She reports getting sinus infections four times per year requriing antibiotics. She does not use a nose spray. She has had allergy testing in the past.   Otherwise, there is no history of other atopic diseases, including drug allergies, stinging insect allergies, eczema, urticaria or contact dermatitis. There is no significant infectious history. Vaccinations are up to date.    Past Medical History: Patient Active Problem List   Diagnosis Date Noted  . Mild intermittent asthma, uncomplicated 32/44/0102  . Perennial allergic rhinitis 06/12/2020  . Anaphylactic shock due to adverse food reaction 06/12/2020  . Chronic urticaria 06/12/2020  . Early satiety 03/11/2020  . Abdominal pain 05/24/2019  . Abdominal bloating 02/20/2019  . Elevated LFTs 02/20/2019  . Gastroesophageal reflux disease 02/20/2019  . Dysphagia 02/20/2019  . Diarrhea 02/20/2019  . Atypical pneumonia 02/19/2016  . Pulmonary infiltrates 02/18/2016  . Hemorrhoid 06/29/2015  . Shortness of breath 11/04/2014  . Tachycardia 10/07/2014  . ASCUS with positive high risk HPV 05/28/2014  . PCOS (polycystic ovarian syndrome) 03/03/2014  . Hyperinsulinemia 03/03/2014  . Morbid obesity ([place]) 05/23/2013  . DUB (dysfunctional uterine  bleeding) 05/23/2013    Medication List:  Allergies as of 06/10/2020      Reactions   Other Anaphylaxis, Other (See Comments)   Pt states that she is allergic to Axe/Tag body spray and Lysol.   General anesthesia - pt woke up several times, was scratching during surgery. Itching  Allergy to sun and bug bites.   Shellfish Allergy Anaphylaxis, Hives   Adhesive [tape] Hives   Eggs Or Egg-derived Products Itching, Nausea Only   Latex Itching      Medication List       Accurate as of June 10, 2020 11:59 PM. If you have any questions, ask your nurse or doctor.        STOP taking these medications   benzonatate 100 MG capsule Commonly known as: TESSALON Stopped by: Valentina Shaggy, MD   ondansetron 4 MG tablet Commonly known as: ZOFRAN Stopped by: Valentina Shaggy, MD     TAKE these medications   albuterol 108 (90 Base) MCG/ACT inhaler Commonly known as: VENTOLIN HFA Inhale 1-2 puffs into the lungs every 6 (six) hours as needed.  EPINEPHrine 0.3 mg/0.3 mL Soaj injection Commonly known as: EpiPen 2-Pak Inject 0.3 mg into the muscle once as needed for anaphylaxis. Started by: Valentina Shaggy, MD   esomeprazole 40 MG capsule Commonly known as: NexIUM Take 1 capsule (40 mg total) by mouth 2 (two) times daily before a meal.   Flaxseed Oil 1200 MG Caps Take 1,200 mg by mouth 2 (two) times daily.   ivabradine 7.5 MG Tabs tablet Commonly known as: CORLANOR Take 1 tablet (7.5 mg total) by mouth 2 (two) times daily with a meal.   levonorgestrel-ethinyl estradiol 0.15-0.03 MG tablet Commonly known as: SEASONALE Take 1 tablet by mouth daily.   MULTIVITAMIN GUMMIES CHILDRENS PO Take by mouth 2 (two) times daily.   topiramate 25 MG capsule Commonly known as: TOPAMAX Take 50 mg by mouth 2 (two) times daily. What changed: Another medication with the same name was removed. Continue taking this medication, and follow the directions you see here. Changed by:  Valentina Shaggy, MD       Birth History: non-contributory  Developmental History: non-contributory  Past Surgical History: Past Surgical History:  Procedure Laterality Date  . BIOPSY  06/19/2019   Procedure: BIOPSY;  Surgeon: Daneil Dolin, MD;  [facility];  Service: Endoscopy;;  . CHOLECYSTECTOMY  2010  . COLONOSCOPY WITH PROPOFOL N/A 06/19/2019   Procedure: COLONOSCOPY WITH PROPOFOL;  Surgeon: Daneil Dolin, MD; 1 tubular adenoma, otherwise normal-appearing colonic mucosa, normal TI.  Stool sample for C. difficile negative.  Recommended repeat colonoscopy in 7 years.  . ESOPHAGOGASTRODUODENOSCOPY    . ESOPHAGOGASTRODUODENOSCOPY (EGD) WITH PROPOFOL N/A 04/08/2019   Procedure: ESOPHAGOGASTRODUODENOSCOPY (EGD) WITH PROPOFOL;  Surgeon: Daneil Dolin, MD; erosive reflux esophagitis s/p dilation, gastric mucosa congested diffusely with snakeskin appearance, antral erosions, congested eroded duodenal bulb, otherwise normal.  . MALONEY DILATION N/A 04/08/2019   Procedure: Venia Minks DILATION;  Surgeon: Daneil Dolin, MD;  [facility];  Service: Endoscopy;  Laterality: N/A;  . POLYPECTOMY  06/19/2019   Procedure: POLYPECTOMY;  Surgeon: Daneil Dolin, MD;  [facility];  Service: Endoscopy;;  cecal polyp     Family History: Family History  Problem Relation Age of Onset  . Cancer Paternal Grandmother        ovarian cancer  . Asthma Son   . ADD / ADHD Son   . Colon cancer Other        late 69's     Social History: Dana lives at home with her family.  They live in a house that was built in the 1950s.  There are hardwoods throughout the home.  They have gas heating and central cooling.  There are birds inside of the home and outside of the home.  There are no dust mite covers on the bedding.  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  She will be exposed to fumes, chemicals, or dust.   She is not using a HEPA filter in the home.  She does not live near an interstate or industrial area.   Review of Systems  Constitutional: Negative.  Negative for chills, fever, malaise/fatigue and weight loss.  HENT: Positive for congestion and sinus pain. Negative for ear discharge and ear pain.   Eyes: Negative for pain, discharge and redness.  Respiratory: Negative for cough, sputum production, shortness of breath and wheezing.   Cardiovascular: Negative.  Negative for chest pain and palpitations.  Gastrointestinal: Positive for abdominal pain, nausea and vomiting. Negative for constipation and heartburn.  Skin: Negative.  Negative for itching and rash.  Neurological: Negative for dizziness and headaches.  Endo/Heme/Allergies: Positive for environmental allergies. Does not bruise/bleed easily.       Objective:   Blood pressure 130/82, pulse 82, temperature 98.6 F (37 C), temperature source Temporal, resp. rate 18, height 5' 7.52" (1.715 m), weight 248 lb 9.6 oz (112.8 kg), SpO2 98 %. Body mass index is 38.34 kg/m.   Physical Exam:   Physical Exam Constitutional:      Appearance: She is well-developed.     Comments: Moving air well in all lung fields.   HENT:     Head: Normocephalic and atraumatic.     Right Ear: Tympanic membrane, ear canal and external ear normal. No drainage, swelling or tenderness. Tympanic membrane is not injected, scarred, erythematous, retracted or bulging.     Left Ear: Tympanic membrane, ear canal and external ear normal. No drainage, swelling or tenderness. Tympanic membrane is not injected, scarred, erythematous, retracted or bulging.     Nose: No nasal deformity, septal deviation, mucosal edema, rhinorrhea or epistaxis.     Right Sinus: No maxillary sinus tenderness or frontal sinus tenderness.     Left Sinus: No maxillary sinus tenderness or frontal sinus tenderness.     Mouth/Throat:     Mouth: Oropharynx is clear and moist. Mucous membranes  are not pale and not dry.     Pharynx: Uvula midline.  Eyes:     General:        Right eye: No discharge.        Left eye: No discharge.     Extraocular Movements: EOM normal.     Conjunctiva/sclera: Conjunctivae normal.     Right eye: Right conjunctiva is not injected. No chemosis.    Left eye: Left conjunctiva is not injected. No chemosis.    Pupils: Pupils are equal, round, and reactive to light.  Cardiovascular:     Rate and Rhythm: Normal rate and regular rhythm.     Heart sounds: Normal heart sounds.  Pulmonary:     Effort: Pulmonary effort is normal. No tachypnea, accessory muscle usage or respiratory distress.     Breath sounds: Normal breath sounds. No wheezing, rhonchi or rales.  Chest:     Chest wall: No tenderness.  Abdominal:     Tenderness: There is no abdominal tenderness. There is no guarding or rebound.  Lymphadenopathy:     Head:     Right side of head: No submandibular, tonsillar or occipital adenopathy.     Left side of head: No submandibular, tonsillar or occipital adenopathy.     Cervical: No cervical adenopathy.  Skin:    Coloration: Skin is not pale.     Findings: No abrasion, erythema, petechiae or rash. Rash is not papular, urticarial or vesicular.  Neurological:     Mental Status: She is alert.  Psychiatric:        Mood and Affect: Mood and affect normal.        Behavior: Behavior is cooperative.      Diagnostic studies:    Spirometry: results normal (FEV1: 2.76/77%, FVC: 3.07/72%, FEV1/FVC: 90%).    Spirometry consistent with normal pattern.   Allergy Studies:     Airborne Adult Perc - 06/10/20 1100    Time Antigen Placed [place] Lavella Hammock    Location Back    Number of Test 59  1. Control-Buffer 50% Glycerol Negative    2. Control-Histamine 1 mg/ml 3+    3. Albumin saline Negative    4. Iberia Negative    5. [place] Negative    6. Johnson Negative    7. [place] Blue Negative    8. Meadow Fescue Negative    9.  Perennial Rye Negative    10. Sweet Vernal Negative    11. Timothy Negative    12. Cocklebur Negative    13. Burweed Marshelder Negative    14. Ragweed, short Negative    15. Ragweed, Giant Negative    16. Plantain,  English Negative    17. Lamb's Quarters Negative    18. Sheep Sorrell Negative    19. Rough Pigweed Negative    20. Marsh Elder, Rough Negative    21. Mugwort, Common Negative    22. Ash mix Negative    23. Birch mix Negative    24. Beech American Negative    25. Box, Elder Negative    26. Cedar, red Negative    27. Cottonwood, [place] Negative    28. Elm mix Negative    29. Hickory Negative    30. Maple mix Negative    31. Oak, [place] mix Negative    32. Pecan Pollen Negative    33. Pine mix Negative    34. Sycamore Eastern Negative    35. Cecil, Black Pollen Negative    36. Alternaria alternata Negative    37. Cladosporium Herbarum Negative    38. Aspergillus mix Negative    39. Penicillium mix Negative    40. Bipolaris sorokiniana (Helminthosporium) Negative    41. Drechslera spicifera (Curvularia) Negative    42. Mucor plumbeus Negative    43. Fusarium moniliforme Negative    44. Aureobasidium pullulans (pullulara) Negative    45. Rhizopus oryzae Negative    46. Botrytis cinera Negative    47. Epicoccum nigrum Negative    48. Phoma betae Negative    49. Candida Albicans Negative    50. Trichophyton mentagrophytes Negative    51. Mite, D Farinae  5,000 AU/ml 3+    52. Mite, D Pteronyssinus  5,000 AU/ml 3+    53. Cat Hair 10,000 BAU/ml Negative    54.  Dog Epithelia Negative    55. Mixed Feathers Negative    56. Horse Epithelia Negative    57. Cockroach, German Negative    58. Mouse Negative    59. Tobacco Leaf Negative          Food Adult Perc - 06/10/20 1100    Time Antigen Placed 1135    Allergen Manufacturer Lavella Hammock    Location Back    Number of allergen test 59    1. Peanut Negative    2. Soybean Negative    3. Wheat Negative    4.  Sesame Negative    5. Milk, cow Negative    6. Egg White, Chicken Negative    7. Casein Negative    8. Shellfish Mix Negative    9. Fish Mix Negative    10. Cashew Negative    11. Pecan Food Negative    12. Crest Negative    13. Almond Negative    14. Hazelnut Negative    15. [place] nut Negative    16. Coconut Negative    17. Pistachio Negative    18. Catfish Negative    19. Bass Negative    20. Trout Negative    21. Tuna Negative  22. Salmon Negative    23. Flounder Negative    24. Codfish Negative    25. Shrimp Negative    26. Crab Negative    27. Lobster Negative    28. Oyster Negative    29. Scallops Negative    30. Barley Negative    31. Oat  Negative    32. Rye  Negative    33. Hops Negative    34. Rice Negative    35. Cottonseed Negative    36. Saccharomyces Cerevisiae  Negative    37. Pork Negative    38. [place] Meat Negative    39. Chicken Meat Negative    40. Beef Negative    41. Lamb Negative    42. Tomato Negative    43. White Potato Negative    44. Sweet Potato Negative    45. Pea, Green/English Negative    46. Navy Bean Negative    47. Mushrooms Negative    48. Avocado Negative    49. Onion Negative    50. Cabbage Negative    51. Carrots Negative    52. Celery Negative    53. Corn Negative    54. Cucumber Negative    55. Grape (White seedless) Negative    56. Orange  Negative    57. Banana Negative    58. Apple Negative    59. Peach Negative    60. Strawberry Negative    61. Cantaloupe Negative    62. Watermelon Negative    63. Pineapple Negative    64. Chocolate/Cacao bean Negative    65. Karaya Gum Negative    66. Acacia (Arabic Gum) Negative    67. Cinnamon Negative    68. Nutmeg Negative    69. Ginger Negative    70. Garlic Negative    71. Pepper, black Negative    72. Mustard Negative           Allergy testing results were read and interpreted by myself, documented by clinical staff.         Salvatore Marvel,  MD [facility] [place] [facility] [place]

## 2020-06-10 NOTE — Patient Instructions (Addendum)
1. Mild intermittent asthma, uncomplicated - Lung testing looked fairly good today. - I do not think that we need to add any controller medications at this time.  - Continue with albuterol 4 puffs every every 4-6 hours as needed.   2. Chronic rhinitis - Testing was negative to the entire panel aside from dust mites.  - Copy of testing results provided. - Continue with you antihistamines as below (for the urticaria/hives).  - We are going to confirm this with blood work.   3. Anaphylactic shock due to food - Testing to the entire food panel was negative. - We are confirming the egg and shellfish since these reactions were so severe. - We will call you in 1-2 weeks with the results of the testing. - EpiPen sent in. - Anaphylaxis management plan provided.   4. Chronic urticaria - Your history does not have any "red flags" such as fevers, joint pains, or permanent skin changes that would be concerning for a more serious cause of hives.  - We will get some labs to rule out serious causes of hives: complete blood count, tryptase level, chronic urticaria panel, CMP, ESR, and CRP. - Chronic hives are often times a self limited process and will "burn themselves out" over 6-12 months, although this is not always the case.  - In the meantime, start suppressive dosing of antihistamines:   - Morning: Zyrtec (cetirizine) 10-51m (two tablets)  - Evening: Zyrtec (cetirizine) 10-291m(two tablets)  - If the above is not working, try adding: Pepcid (famotidine) 2017mup to twice daily) - You can change this dosing at home, decreasing the dose as needed or increasing the dosing as needed.   5. Return in about 4 weeks (around 07/08/2020).    Please inform us [place] any Emergency Department visits, hospitalizations, or changes in symptoms. Call us [place] going to the ED for breathing or allergy symptoms since we might be able to fit you in for a sick visit. Feel free to contact us [place] with any questions,  problems, or concerns.  It was a pleasure to meet you today!  Websites that have reliable patient information: 1. American Academy of Asthma, Allergy, and Immunology: www.aaaai.org 2. Food Allergy Research and Education (FARE): foodallergy.org 3. Mothers of Asthmatics: http://www.asthmacommunitynetwork.org 4. American College of Allergy, Asthma, and Immunology: www.acaai.org   COVID-19 Vaccine Information can be found at: [email or web address] questions related to vaccine distribution or appointments, please email [email or web address] or call [phone] We realize that you might be concerned about having an allergic reaction to the COVID19 vaccines. To help with that concern, WE ARE OFFERING THE COVID19 VACCINES IN OUR OFFICE! Ask the front desk for dates!     "Like" us [place] Facebook and Instagram for our latest updates!      A healthy democracy works best when ALLNew York Life Insurancerticipate! Make sure you are registered to vote! If you have moved or changed any of your contact information, you will need to get this updated before voting!  In some cases, you MAY be able to register to vote online: [email or web address]  Control of Dust Mite Allergen    Dust mites play a major role in allergic asthma and rhinitis.  They occur in environments with high humidity wherever human skin is found.  Dust mites absorb humidity from the atmosphere (ie, they do not drink) and feed on organic matter (including shed human and animal skin).  Dust mites are a microscopic type of insect that  you cannot see with the naked eye.  High levels of dust mites have been detected from mattresses, pillows, carpets, upholstered furniture, bed covers, clothes, soft toys and any woven material.  The principal allergen of the dust mite is found in its feces.  A gram of dust may contain 1,000 mites and 250,000 fecal particles.   Mite antigen is easily measured in the air during house cleaning activities.  Dust mites do not bite and do not cause harm to humans, other than by triggering allergies/asthma.    Ways to decrease your exposure to dust mites in your home:  1. Encase mattresses, box springs and pillows with a mite-impermeable barrier or cover   2. Wash sheets, blankets and drapes weekly in hot water (130 F) with detergent and dry them in a dryer on the hot setting.  3. Have the room cleaned frequently with a vacuum cleaner and a damp dust-mop.  For carpeting or rugs, vacuuming with a vacuum cleaner equipped with a high-efficiency particulate air (HEPA) filter.  The dust mite allergic individual should not be in a room which is being cleaned and should wait 1 hour after cleaning before going into the room. 4. Do not sleep on upholstered furniture (eg, couches).   5. If possible removing carpeting, upholstered furniture and drapery from the home is ideal.  Horizontal blinds should be eliminated in the rooms where the person spends the most time (bedroom, study, television room).  Washable vinyl, roller-type shades are optimal. 6. Remove all non-washable stuffed toys from the bedroom.  Wash stuffed toys weekly like sheets and blankets above.   7. Reduce indoor humidity to less than 50%.  Inexpensive humidity monitors can be purchased at most hardware stores.  Do not use a humidifier as can make the problem worse and are not recommended.

## 2020-06-11 ENCOUNTER — Ambulatory Visit: Payer: Medicaid Other | Admitting: Gastroenterology

## 2020-06-12 DIAGNOSIS — L508 Other urticaria: Secondary | ICD-10-CM | POA: Insufficient documentation

## 2020-06-12 DIAGNOSIS — T7800XA Anaphylactic reaction due to unspecified food, initial encounter: Secondary | ICD-10-CM | POA: Insufficient documentation

## 2020-06-12 DIAGNOSIS — J3089 Other allergic rhinitis: Secondary | ICD-10-CM | POA: Insufficient documentation

## 2020-06-12 DIAGNOSIS — J4520 Mild intermittent asthma, uncomplicated: Secondary | ICD-10-CM | POA: Insufficient documentation

## 2020-06-22 ENCOUNTER — Other Ambulatory Visit: Payer: Self-pay

## 2020-06-22 ENCOUNTER — Ambulatory Visit (HOSPITAL_COMMUNITY)
Admission: RE | Admit: 2020-06-22 | Discharge: 2020-06-22 | Disposition: A | Discharge status: Home / Self Care | Payer: 59 | Source: Ambulatory Visit | Attending: Internal Medicine | Admitting: Internal Medicine

## 2020-06-22 ENCOUNTER — Other Ambulatory Visit (HOSPITAL_COMMUNITY): Payer: Self-pay | Admitting: Internal Medicine

## 2020-06-22 DIAGNOSIS — R042 Hemoptysis: Secondary | ICD-10-CM | POA: Diagnosis present

## 2020-06-22 IMAGING — DX DG CHEST 2V
2 series · 2 of 2 positions shown · non-contrast
Comparison: [DATE]

CLINICAL DATA: Hemoptysis.

EXAM:
CHEST - 2 VIEW

[chest pa]
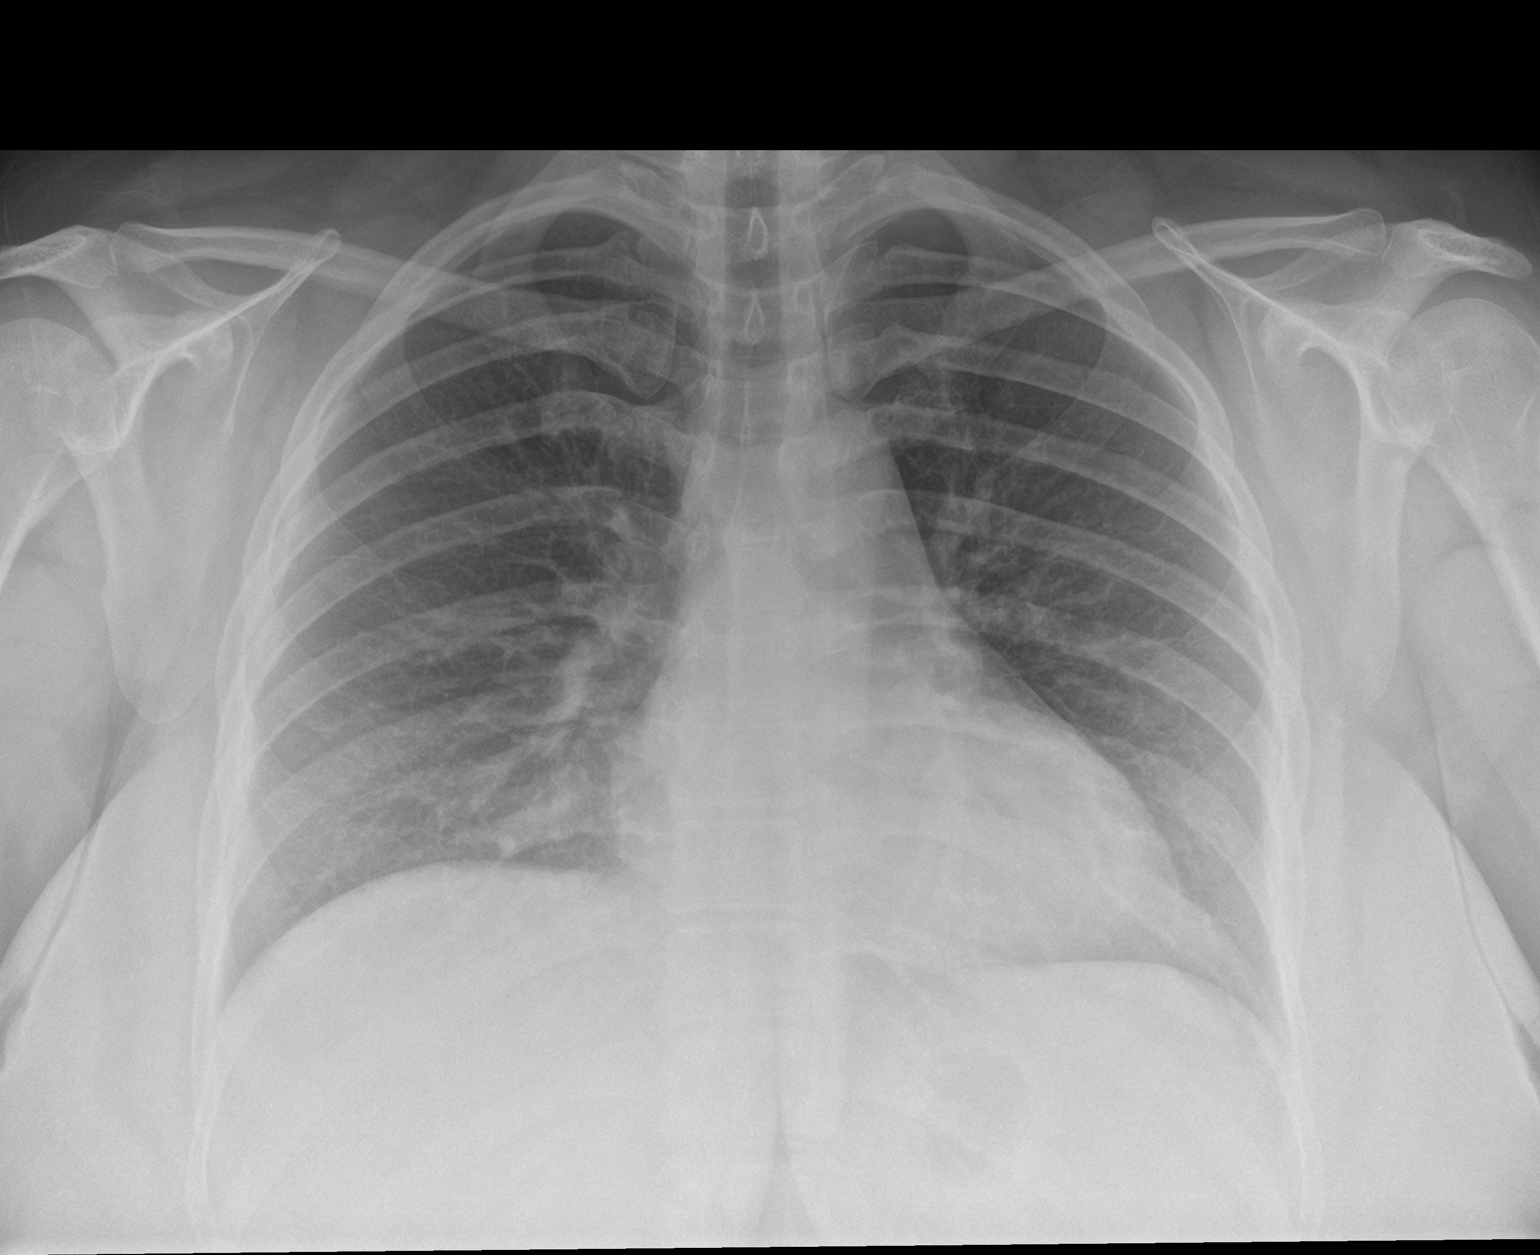

[chest lat]
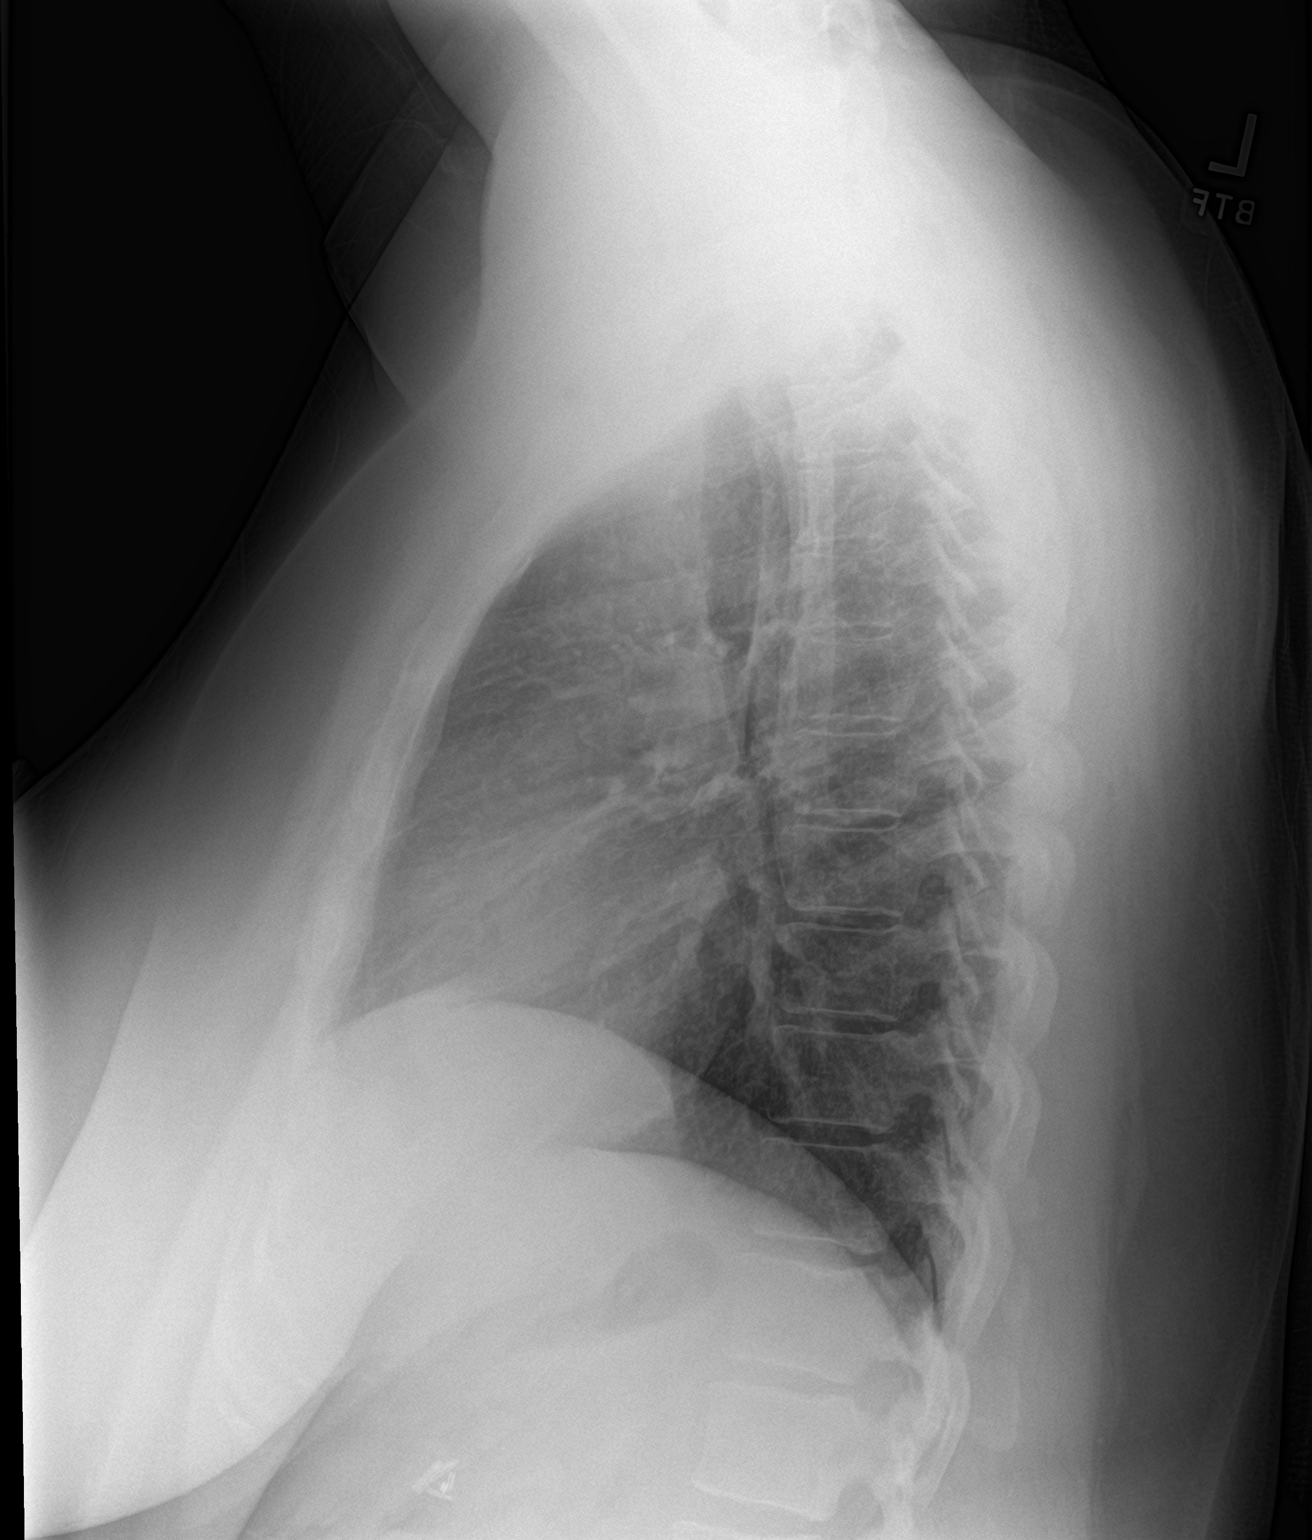

[2 of 2 positions shown; findings below may reference images not displayed]

FINDINGS: The heart size and mediastinal contours are within normal limits.
Both lungs are clear. The visualized skeletal structures are
unremarkable.
IMPRESSION: Normal exam.

## 2020-06-24 ENCOUNTER — Other Ambulatory Visit: Payer: Self-pay

## 2020-06-24 ENCOUNTER — Telehealth: Payer: Self-pay | Admitting: Women's Health

## 2020-06-24 MED ORDER — LEVONORGEST-ETH ESTRAD 91-DAY 0.15-0.03 MG PO TABS: 1.0000 | ORAL_TABLET | Freq: Every day | ORAL | 1 refills | Status: DC

## 2020-06-24 NOTE — Telephone Encounter (Signed)
Pt needs refill on her BC until her physical 08/03/2020  levonorgestrel-ethinyl estradiol (SEASONALE) 0.15-0.03 MG tablet   Please advise & notify pt     [employer]

## 2020-07-15 ENCOUNTER — Encounter: Payer: Self-pay | Admitting: Allergy & Immunology

## 2020-07-15 ENCOUNTER — Other Ambulatory Visit: Payer: Self-pay

## 2020-07-15 ENCOUNTER — Ambulatory Visit (INDEPENDENT_AMBULATORY_CARE_PROVIDER_SITE_OTHER): Payer: 59 | Admitting: Allergy & Immunology

## 2020-07-15 VITALS — BP 120/82 | HR 79 | Temp 97.8°F | Resp 14 | Ht 67.5 in | Wt 245.4 lb

## 2020-07-15 DIAGNOSIS — J3089 Other allergic rhinitis: Secondary | ICD-10-CM | POA: Diagnosis not present

## 2020-07-15 DIAGNOSIS — M25542 Pain in joints of left hand: Secondary | ICD-10-CM

## 2020-07-15 DIAGNOSIS — M25541 Pain in joints of right hand: Secondary | ICD-10-CM

## 2020-07-15 DIAGNOSIS — T7800XD Anaphylactic reaction due to unspecified food, subsequent encounter: Secondary | ICD-10-CM | POA: Diagnosis not present

## 2020-07-15 DIAGNOSIS — J4520 Mild intermittent asthma, uncomplicated: Secondary | ICD-10-CM | POA: Diagnosis not present

## 2020-07-15 DIAGNOSIS — L508 Other urticaria: Secondary | ICD-10-CM

## 2020-07-15 DIAGNOSIS — R768 Other specified abnormal immunological findings in serum: Secondary | ICD-10-CM

## 2020-07-15 NOTE — Progress Notes (Signed)
FOLLOW UP  Date of Service/Encounter:  07/15/20   Assessment:   Mild intermittent asthma, uncomplicated  Chronic rhinitis  Anaphylactic shock due to food  Chronic urticaria - interested in initiating Xolair  Joint pains in hands/wrist - in the setting of elevated inflammatory markers (Rheum referral placed)   Tachycardia  Full vaccinated with Moderna (NEEDS booster)   Overall, Dana seems to be doing better from a breathing perspective.  She did end up getting diagnosed with Covid pneumonia after her last visit, which is why she went and worsening breathing issues.  She is now feeling much better and not using her asthma at all.  Regarding her anaphylaxis to food, she knows what foods to avoid.  Her rhinitis is controlled with antihistamines.  However, her chronic urticaria continues to be an issue.  She is not very great at remembering to use her antihistamines.  She also endorses pain in her fingers as well as her right wrist and would like to get a rheumatology referral to evaluate that.  Finally, she is interested in initiating Xolair.  We will get that process started.  Plan/Recommendations:   1. Mild intermittent asthma, uncomplicated - Lung testing not done today. - You seem to be under good control.  - I do not think that we need to add any controller medications at this time.  - Continue with albuterol 4 puffs every every 4-6 hours as needed.   2. Chronic rhinitis (dust mites, cockroach) - Continue with your antihistamines.  3. 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 - EpiPen is up to date.   4. Chronic urticaria with elevated ANA and elevated inflammatory markers - We are going to refer you to Rheumatology for evaluation of possible autoimmune causes of your symptoms.  - In the meantime, continue with suppressive dosing of antihistamines: Zyrtec two tablets up to twice daily -  Information on Xolair provided today. - Consent signed today. - We will submit for Xolair approval.   5. Return in about 3 months (around 10/15/2020).   Subjective:   Dana Mcpherson is a 30 y.o. female presenting today for follow up of  Chief Complaint  Patient presents with  . Asthma    No issues since last visit ACT: 25 last was 8    Dana Mcpherson has a history of the following: Patient Active Problem List   Diagnosis Date Noted  . Mild intermittent asthma, uncomplicated 29/93/7169  . Perennial allergic rhinitis 06/12/2020  . Anaphylactic shock due to adverse food reaction 06/12/2020  . Chronic urticaria 06/12/2020  . Early satiety 03/11/2020  . Abdominal pain 05/24/2019  . Abdominal bloating 02/20/2019  . Elevated LFTs 02/20/2019  . Gastroesophageal reflux disease 02/20/2019  . Dysphagia 02/20/2019  . Diarrhea 02/20/2019  . Atypical pneumonia 02/19/2016  . Pulmonary infiltrates 02/18/2016  . Hemorrhoid 06/29/2015  . Shortness of breath 11/04/2014  . Tachycardia 10/07/2014  . ASCUS with positive high risk HPV 05/28/2014  . PCOS (polycystic ovarian syndrome) 03/03/2014  . Hyperinsulinemia 03/03/2014  . Morbid obesity ([place]) 05/23/2013  . DUB (dysfunctional uterine bleeding) 05/23/2013    History obtained from: chart review and patient.  Dana is a 30 y.o. female presenting for a follow up visit.  She was last seen in February 2022.  At that time, her lung testing looked fairly good.  I did not feel like we need to add any controller medications.  We will continue  with albuterol 4 puffs every 4-6 hours as needed.  She had testing that was negative to the entire panel aside from dust mites.  We did confirm this with blood work and continue with antihistamines that she was on.  For her history of food allergies, we did testing to the entire food panel that was completely negative.  We confirmed the egg and shellfish with blood work since her reactions to those are so  severe.  We did give her an EpiPen and anaphylaxis management plan.  She has a history of chronic urticaria.  We obtained labs and started Zyrtec 1 to 2 tablets twice daily, adding famotidine twice daily if needed.  Labs came back with an ANA that was borderline at 1:80 with elevated inflammatory markers.  We did offer a rheumatology referral.  Metabolic panel was notable for slightly elevated liver function testing.  Serum tryptase was normal.  Shellfish was slightly elevated to crab and shrimp.  Egg component panel was negative.  Alpha gal was negative.  Environmental allergy panel was positive to dust mite and cockroach.   Since last visit, she has done well.   Asthma/Respiratory Symptom History: Her asthma tends to flare when she is sick. She had COVID PNA shortly after the last visit. She got better and then had PNA again. She is not using her rescue inhaler at all. She only ever uses it when she is sick. Overall she is doing fairly well.   Allergic Rhinitis Symptom History: Allergic rhinitis is controlled with use of antihistamines.  She is not using any nasal sprays.  Food Allergy Symptom History: She continues to avoid all fish. She also voiced anger because mother triggering foods.  She has had no accidental exposures.  She has a good handle on what she needs to avoid. EpiPen is up to date.    Urticaria Symptom History: She only has hives when she touches something that she is allergic to. She had a breakout after the testing. She does not see a Rheumatologist. She does endorse some joint pains but she tends to not complain about. She has right wrist pain and finger pain. She is open to see a Rheumatologist. She is not great about remembering to use her antihistamines. She is interested in Xolair as well.   Otherwise, there have been no changes to her past medical history, surgical history, family history, or social history.    Review of Systems  Constitutional: Negative.  Negative for  chills, fever, malaise/fatigue and weight loss.  HENT: Positive for congestion. Negative for ear discharge, ear pain and sinus pain.   Eyes: Negative for pain, discharge and redness.  Respiratory: Negative for cough, sputum production, shortness of breath and wheezing.   Cardiovascular: Negative.  Negative for chest pain and palpitations.  Gastrointestinal: Negative for abdominal pain, constipation, diarrhea, heartburn, nausea and vomiting.  Musculoskeletal: Positive for joint pain.  Skin: Negative.  Negative for itching and rash.       Positive for urticaria.  Neurological: Negative for dizziness and headaches.  Endo/Heme/Allergies: Positive for environmental allergies. Does not bruise/bleed easily.       Objective:   Blood pressure 120/82, pulse 79, temperature 97.8 F (36.6 C), resp. rate 14, height 5' 7.5" (1.715 m), weight 245 lb 6.4 oz (111.3 kg), SpO2 97 %. Body mass index is 37.87 kg/m.   Physical Exam:  Physical Exam Constitutional:      Appearance: She is well-developed. She is obese.  HENT:     Head:  Normocephalic and atraumatic.     Right Ear: Tympanic membrane, ear canal and external ear normal.     Left Ear: Tympanic membrane and ear canal normal.     Nose: No nasal deformity, septal deviation, mucosal edema or rhinorrhea.     Right Sinus: No maxillary sinus tenderness or frontal sinus tenderness.     Left Sinus: No maxillary sinus tenderness or frontal sinus tenderness.     Mouth/Throat:     Mouth: Mucous membranes are not pale and not dry.     Pharynx: Uvula midline.  Eyes:     General: Lids are normal. Allergic shiner present.        Right eye: No discharge.        Left eye: No discharge.     Conjunctiva/sclera: Conjunctivae normal.     Right eye: Right conjunctiva is not injected. No chemosis.    Left eye: Left conjunctiva is not injected. No chemosis.    Pupils: Pupils are equal, round, and reactive to light.  Cardiovascular:     Rate and Rhythm:  Normal rate and regular rhythm.     Heart sounds: Normal heart sounds.  Pulmonary:     Effort: Pulmonary effort is normal. No tachypnea, accessory muscle usage or respiratory distress.     Breath sounds: Normal breath sounds. No wheezing, rhonchi or rales.     Comments: Moving air well in all lung fields. No increased work of breathing noted.  Chest:     Chest wall: No tenderness.  Lymphadenopathy:     Cervical: No cervical adenopathy.  Skin:    General: Skin is warm.     Capillary Refill: Capillary refill takes less than 2 seconds.     Coloration: Skin is not pale.     Findings: No abrasion, erythema, petechiae or rash. Rash is not papular, urticarial or vesicular.     Comments: Execrations present.   Neurological:     Mental Status: She is alert.      Diagnostic studies: none      Salvatore Marvel, MD  [facility]

## 2020-07-15 NOTE — Patient Instructions (Addendum)
1. Mild intermittent asthma, uncomplicated - Lung testing not done today. - You seem to be under good control.  - I do not think that we need to add any controller medications at this time.  - Continue with albuterol 4 puffs every every 4-6 hours as needed.   2. Chronic rhinitis (dust mites, cockroach) - Continue with your antihistamines.  3. 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 - EpiPen is up to date.   4. Chronic urticaria with elevated ANA and elevated inflammatory markers - We are going to refer you to Rheumatology for evaluation of possible autoimmune causes of your symptoms.  - In the meantime, continue with suppressive dosing of antihistamines: Zyrtec two tablets up to twice daily - Information on Xolair provided today. - Consent signed today. - We will submit for Xolair approval.   5. Return in about 3 months (around 10/15/2020).    Please inform us of any Emergency Department visits, hospitalizations, or changes in symptoms. Call us before going to the ED for breathing or allergy symptoms since we might be able to fit you in for a sick visit. Feel free to contact us anytime with any questions, problems, or concerns.  It was a pleasure to see you again today!  Websites that have reliable patient information: 1. American Academy of Asthma, Allergy, and Immunology: www.aaaai.org 2. Food Allergy Research and Education (FARE): foodallergy.org 3. Mothers of Asthmatics: http://www.asthmacommunitynetwork.org 4. American College of Allergy, Asthma, and Immunology: www.acaai.org   COVID-19 Vaccine Information can be found at: ShippingScam.co.uk For questions related to vaccine distribution or appointments, please email [email or web address] [email or web address] or call [phone].   We realize that you might be concerned about having an allergic reaction to the  COVID19 vaccines. To help with that concern, WE ARE OFFERING THE COVID19 VACCINES IN OUR OFFICE! Ask the front desk for dates!     "Like" [place] on Facebook and Instagram for our latest updates!      A healthy democracy works best when [employer] participate! Make sure you are registered to vote! If you have moved or changed any of your contact information, you will need to get this updated before voting!  In some cases, you MAY be able to register to vote online: [email or web address]

## 2020-07-21 ENCOUNTER — Ambulatory Visit: Payer: Medicaid Other | Admitting: Gastroenterology

## 2020-07-22 LAB — CMP14+EGFR
ALT: 43 IU/L — ABNORMAL HIGH (ref 0–32)
AST: 41 IU/L — ABNORMAL HIGH (ref 0–40)
Albumin/Globulin Ratio: 1.9 (ref 1.2–2.2)
Albumin: 4.8 g/dL (ref 3.8–4.8)
Alkaline Phosphatase: 75 IU/L (ref 44–121)
BUN/Creatinine Ratio: 8 — ABNORMAL LOW (ref 9–23)
BUN: 8 mg/dL (ref 6–20)
Bilirubin Total: 0.4 mg/dL (ref 0.0–1.2)
CO2: 19 mmol/L — ABNORMAL LOW (ref 20–29)
Calcium: 9.6 mg/dL (ref 8.7–10.2)
Chloride: 105 mmol/L (ref 96–106)
Creatinine, Ser: 0.98 mg/dL (ref 0.57–1.00)
Globulin, Total: 2.5 g/dL (ref 1.5–4.5)
Glucose: 73 mg/dL (ref 65–99)
Potassium: 4 mmol/L (ref 3.5–5.2)
Sodium: 142 mmol/L (ref 134–144)
Total Protein: 7.3 g/dL (ref 6.0–8.5)
eGFR: 79 mL/min/{1.73_m2} (ref >59)

## 2020-07-22 LAB — ALLERGEN PROFILE, SHELLFISH
Clam IgE: <0.1 kU/L
F023-IgE Crab: 0.1 kU/L — AB
F080-IgE Lobster: <0.1 kU/L
F290-IgE Oyster: <0.1 kU/L
Scallop IgE: <0.1 kU/L
Shrimp IgE: 0.15 kU/L — AB

## 2020-07-22 LAB — IGE+ALLERGENS ZONE 2(30)
Alternaria Alternata IgE: <0.1 kU/L
Amer Sycamore IgE Qn: <0.1 kU/L
Aspergillus Fumigatus IgE: <0.1 kU/L
Bahia Grass IgE: <0.1 kU/L
Bermuda Grass IgE: <0.1 kU/L
Cat Dander IgE: <0.1 kU/L
Cedar, Mountain IgE: <0.1 kU/L
Cladosporium Herbarum IgE: <0.1 kU/L
Cockroach, American IgE: 0.1 kU/L — AB
Common Silver Birch IgE: <0.1 kU/L
D Farinae IgE: 0.14 kU/L — AB
D Pteronyssinus IgE: 0.14 kU/L — AB
Dog Dander IgE: <0.1 kU/L
Elm, American IgE: <0.1 kU/L
Hickory, White IgE: <0.1 kU/L
IgE (Immunoglobulin E), Serum: 40 IU/mL (ref 6–495)
Johnson Grass IgE: <0.1 kU/L
Maple/Box Elder IgE: <0.1 kU/L
Mucor Racemosus IgE: <0.1 kU/L
Mugwort IgE Qn: <0.1 kU/L
Nettle IgE: <0.1 kU/L
Oak, White IgE: <0.1 kU/L
Penicillium Chrysogen IgE: <0.1 kU/L
Pigweed, Rough IgE: <0.1 kU/L
Plantain, English IgE: <0.1 kU/L
Ragweed, Short IgE: <0.1 kU/L
Sheep Sorrel IgE Qn: <0.1 kU/L
Stemphylium Herbarum IgE: <0.1 kU/L
Sweet gum IgE RAST Ql: <0.1 kU/L
Timothy Grass IgE: <0.1 kU/L
White Mulberry IgE: <0.1 kU/L

## 2020-07-22 LAB — ALPHA-GAL PANEL
Allergen Lamb IgE: <0.1 kU/L
Beef IgE: <0.1 kU/L
O215-IgE Alpha-Gal: <0.1 kU/L
Pork IgE: <0.1 kU/L

## 2020-07-22 LAB — FANA STAINING PATTERNS: Homogeneous Pattern: 1:80 {titer}

## 2020-07-22 LAB — ANTINUCLEAR ANTIBODIES, IFA: ANA Titer 1: POSITIVE — AB

## 2020-07-22 LAB — SEDIMENTATION RATE: Sed Rate: 38 mm/hr — ABNORMAL HIGH (ref 0–32)

## 2020-07-22 LAB — C-REACTIVE PROTEIN: CRP: 32 mg/L — ABNORMAL HIGH (ref 0–10)

## 2020-07-22 LAB — EGG COMPONENT PANEL
F232-IgE Ovalbumin: <0.1 kU/L
F233-IgE Ovomucoid: <0.1 kU/L

## 2020-07-22 LAB — CHRONIC URTICARIA: cu index: 1.9 (ref <10)

## 2020-07-22 LAB — TRYPTASE: Tryptase: 3.2 ug/L (ref 2.2–13.2)

## 2020-07-22 NOTE — Progress Notes (Signed)
[facility] to ensure they received the referral and accept [facility]. They state they did receive it and it was approved on 3/24. She will call patient to schedule today. Left detailed message on patient's voicemail informing her of this information.

## 2020-08-03 ENCOUNTER — Ambulatory Visit (INDEPENDENT_AMBULATORY_CARE_PROVIDER_SITE_OTHER): Payer: 59 | Admitting: Women's Health

## 2020-08-03 ENCOUNTER — Other Ambulatory Visit: Payer: Self-pay

## 2020-08-03 ENCOUNTER — Encounter: Payer: Self-pay | Admitting: Women's Health

## 2020-08-03 VITALS — BP 125/85 | HR 88 | Ht 66.0 in | Wt 245.0 lb

## 2020-08-03 DIAGNOSIS — Z3041 Encounter for surveillance of contraceptive pills: Secondary | ICD-10-CM | POA: Diagnosis not present

## 2020-08-03 DIAGNOSIS — Z0000 Encounter for general adult medical examination without abnormal findings: Secondary | ICD-10-CM | POA: Diagnosis not present

## 2020-08-03 MED ORDER — LEVONORGEST-ETH ESTRAD 91-DAY 0.15-0.03 MG PO TABS: 1.0000 | ORAL_TABLET | Freq: Every day | ORAL | 3 refills | Status: DC

## 2020-08-03 NOTE — Patient Instructions (Signed)
Salpingectomy Salpingectomy, also called tubectomy, is the surgical removal of one of the fallopian tubes. The fallopian tubes are where eggs travel from the ovaries to the uterus. Removing one fallopian tube does not prevent you from becoming pregnant. It also does not cause problems with your menstrual periods. You may need this procedure if you:  Have a fertilized egg that attaches to the fallopian tube (ectopic pregnancy), especially one that causes the tube to burst or tear (rupture).  Have an infected fallopian tube.  Have cancer of the fallopian tube or nearby organs.  Have had an ovary removed due to a cyst or tumor.  Have had your uterus removed.  Are at high risk for ovarian cancer. There are three different methods that can be used for a salpingectomy:  An open method that involves making one large incision in your abdomen.  A laparoscopic method that involves using a thin, lighted tube with a tiny camera on the end (laparoscope) to help perform the procedure. The laparoscope will allow your surgeon to make several small incisions in the abdomen instead of one large incision.  A robot-assisted method that involves using a computer to control surgical instruments that are attached to robotic arms. Tell a health care provider about:  Any allergies you have.  All medicines you are taking, including vitamins, herbs, eye drops, creams, and over-the-counter medicines.  Any problems you or family members have had with anesthetic medicines.  Any blood disorders you have.  Any surgeries you have had.  Any medical conditions you have.  Whether you are pregnant or may be pregnant. What are the risks? Generally, this is a safe procedure. However, problems may occur, including:  Infection.  Bleeding.  Allergic reactions to medicines.  Blood clots in the legs or lungs.  Damage to other structures or organs. What happens before the procedure? Medicines  Ask your health  care provider about: ? Changing or stopping your regular medicines. This is especially important if you are taking diabetes medicines or blood thinners. ? Taking medicines such as aspirin and ibuprofen. These medicines can thin your blood. Do not take these medicines unless your health care provider tells you to take them. ? Taking over-the-counter medicines, vitamins, herbs, and supplements. Staying hydrated Follow instructions from your health care provider about hydration, which may include:  Up to 2 hours before the procedure - you may continue to drink clear liquids, such as water, clear fruit juice, black coffee, and plain tea. Eating and drinking restrictions Follow instructions from your health care provider about eating and drinking, which may include:  8 hours before the procedure - stop eating heavy meals or foods, such as meat, fried foods, or fatty foods.  6 hours before the procedure - stop eating light meals or foods, such as toast or cereal.  6 hours before the procedure - stop drinking milk or drinks that contain milk.  2 hours before the procedure - stop drinking clear liquids. General instructions  Do not use any products that contain nicotine or tobacco for at least 4 weeks before the procedure. These products include cigarettes, e-cigarettes, and chewing tobacco. If you need help quitting, ask your health care provider.  You may have an exam or tests, such as: ? An electrocardiogram (ECG). ? A blood or urine test.  Ask your health care provider what steps will be taken to help prevent infection. These may include: ? Removing hair at the surgery site. ? Washing skin with a germ-killing soap. ?  Taking antibiotic medicine.  Plan to have someone take you home from the hospital or clinic.  If you will be going home right after the procedure, plan to have someone with you for 24 hours. What happens during the procedure?  An IV will be inserted into one of your  veins.  You will be given one or more of the following: ? A medicine to help you relax (sedative). ? A medicine to make you fall asleep (general anesthetic).  A thin tube (catheter) may be inserted through your urethra and into your bladder to drain urine during your procedure.  Depending on the type of procedure you are having, one incision or several small incisions will be made in your abdomen.  Your fallopian tube will be cut and removed from where it attaches to your uterus.  Your blood vessels will be clamped and tied to prevent excess bleeding.  The incisions in your abdomen will be closed with stitches (sutures), staples, or skin glue.  A bandage (dressing) may be placed over your incisions. The procedure may vary among health care providers and hospitals. What happens after the procedure?  Your blood pressure, heart rate, breathing rate, and blood oxygen level will be monitored until you leave the hospital.  You may continue to receive fluids and medicines through an IV.  You may continue to have a catheter draining your urine.  You may have to wear compression stockings. These stockings help to prevent blood clots and reduce swelling in your legs.  You will be given pain medicine as needed.  Do not drive for 24 hours if you were given a sedative during your procedure.   Summary  Salpingectomy is a surgical procedure to remove one of the fallopian tubes.  The procedure may be done with an open incision, a laparoscope, or computer-controlled instruments.  Depending on the type of procedure you are having, one incision or several small incisions will be made in your abdomen.  Your blood pressure, heart rate, breathing rate, and blood oxygen level will be monitored until you leave the hospital.  Plan to have someone take you home from the hospital or clinic. This information is not intended to replace advice given to you by your health care provider. Make sure you  discuss any questions you have with your health care provider. Document Revised: 04/02/2018 Document Reviewed: 04/02/2018 Elsevier Patient Education  2021 [employer].

## 2020-08-03 NOTE — Progress Notes (Signed)
WELL-WOMAN EXAMINATION Patient name: Dana Mcpherson MRN [id number]  Date of birth: 07/16/1990 Chief Complaint:   Gynecologic Exam  History of Present Illness:   Dana Mcpherson is a 30 y.o. [id number] Caucasian female being seen today for a routine well-woman exam.  Current complaints: wants BTL  Depression screen [facility] 2/9 08/03/2020 07/11/2019 05/03/2017  Decreased Interest 0 0 0  Down, Depressed, Hopeless 0 1 1  PHQ - 2 Score 0 1 1  Altered sleeping 2 - -  Tired, decreased energy 1 - -  Change in appetite 0 - -  Feeling bad or failure about yourself  0 - -  Trouble concentrating 0 - -  Moving slowly or fidgety/restless 0 - -  Suicidal thoughts 0 - -  PHQ-9 Score 3 - -     PCP: [facility]      does not desire labs No LMP recorded. (Menstrual status: Oral contraceptives). The current method of family planning is OCP (estrogen/progesterone).  Last pap 07/11/19. Results were: NILM w/ HRHPV negative. H/O abnormal pap: yes 2016 Last mammogram: never. Results were: N/A. Family h/o breast cancer: GGM in 90s Last colonoscopy: 2021 for persistent diarrhea/bloody stools. Results were: abnormal 2 pre-cancerous polyps, to f/u in 53yrs for next TCS. Family h/o colorectal cancer: no Review of Systems:   Pertinent items are noted in HPI Denies any headaches, blurred vision, fatigue, shortness of breath, chest pain, abdominal pain, abnormal vaginal discharge/itching/odor/irritation, problems with periods, bowel movements, urination, or intercourse unless otherwise stated above. Pertinent History Reviewed:  Reviewed past medical,surgical, social and family history.  Reviewed problem list, medications and allergies. Physical Assessment:   Vitals:   08/03/20 1339  BP: 125/85  Pulse: 88  Weight: 245 lb (111.1 kg)  Height: 5\' 6"  (1.676 m)  Body mass index is 39.54 kg/m.        Physical Examination:   General appearance - well appearing, and in no distress  Mental status - alert, oriented to  person, place, and time  Psych:  She has a normal mood and affect  Skin - warm and dry, normal color, no suspicious lesions noted  Chest - effort normal, all lung fields clear to auscultation bilaterally  Heart - normal rate and regular rhythm  Neck:  midline trachea, no thyromegaly or nodules  Breasts - breasts appear normal, no suspicious masses, no skin or nipple changes or  axillary nodes  Abdomen - soft, nontender, nondistended, no masses or organomegaly  Pelvic - VULVA: normal appearing vulva with no masses, tenderness or lesions  VAGINA: normal appearing vagina with normal color and discharge, no lesions  CERVIX: normal appearing cervix without discharge or lesions, no CMT  Thin prep pap is not done   UTERUS: uterus is felt to be normal size, shape, consistency and nontender   ADNEXA: No adnexal masses or tenderness noted.  Extremities:  No swelling or varicosities noted  Chaperone: Latisha Cresenzo    No results found for this or any previous visit (from the past 24 hour(s)).  Assessment & Plan:  1) Well-Woman Exam  2) Contraception management> refilled COCs, wants BTL/salpingectomy- but will want to continue COCs as well d/t PCOS. F/U w/ MD for pre-op  Labs/procedures today: exam  Mammogram: @ 30yo, or sooner if problems Colonoscopy: per GI, or sooner if problems  No orders of the defined types were placed in this encounter.   Meds:  Meds ordered this encounter  Medications  . levonorgestrel-ethinyl estradiol (SEASONALE) 0.15-0.03 MG tablet  Sig: Take 1 tablet by mouth daily.    Dispense:  91 tablet    Refill:  3    Order Specific Question:   Supervising Provider    Answer:   Florian Buff [[id number]]    Follow-up: Return for 1st available w/ [facility] for BTL pre-op.  [place], [facility] 08/03/2020 2:04 PM

## 2020-08-11 ENCOUNTER — Ambulatory Visit
Admission: EM | Admit: 2020-08-11 | Discharge: 2020-08-11 | Disposition: A | Discharge status: Home / Self Care | Payer: 59 | Attending: Family Medicine | Admitting: Family Medicine

## 2020-08-11 ENCOUNTER — Emergency Department (HOSPITAL_COMMUNITY)
Admission: EM | Admit: 2020-08-11 | Discharge: 2020-08-11 | Disposition: A | Discharge status: Home / Self Care | Payer: 59 | Attending: Emergency Medicine | Admitting: Emergency Medicine

## 2020-08-11 ENCOUNTER — Emergency Department (HOSPITAL_COMMUNITY): Payer: 59

## 2020-08-11 ENCOUNTER — Other Ambulatory Visit: Payer: Self-pay

## 2020-08-11 ENCOUNTER — Telehealth: Payer: Self-pay | Admitting: Student

## 2020-08-11 ENCOUNTER — Encounter (HOSPITAL_COMMUNITY): Payer: Self-pay | Admitting: Emergency Medicine

## 2020-08-11 DIAGNOSIS — J4520 Mild intermittent asthma, uncomplicated: Secondary | ICD-10-CM | POA: Diagnosis not present

## 2020-08-11 DIAGNOSIS — Z87891 Personal history of nicotine dependence: Secondary | ICD-10-CM | POA: Diagnosis not present

## 2020-08-11 DIAGNOSIS — R079 Chest pain, unspecified: Secondary | ICD-10-CM | POA: Insufficient documentation

## 2020-08-11 DIAGNOSIS — M549 Dorsalgia, unspecified: Secondary | ICD-10-CM | POA: Diagnosis not present

## 2020-08-11 DIAGNOSIS — R002 Palpitations: Secondary | ICD-10-CM | POA: Diagnosis not present

## 2020-08-11 DIAGNOSIS — Z91040 Latex allergy status: Secondary | ICD-10-CM | POA: Insufficient documentation

## 2020-08-11 LAB — BASIC METABOLIC PANEL
Anion gap: 8 (ref 5–15)
BUN: 10 mg/dL (ref 6–20)
CO2: 24 mmol/L (ref 22–32)
Calcium: 9 mg/dL (ref 8.9–10.3)
Chloride: 106 mmol/L (ref 98–111)
Creatinine, Ser: 0.81 mg/dL (ref 0.44–1.00)
GFR, Estimated: >60 mL/min (ref >60)
Glucose, Bld: 84 mg/dL (ref 70–99)
Potassium: 4.2 mmol/L (ref 3.5–5.1)
Sodium: 138 mmol/L (ref 135–145)

## 2020-08-11 LAB — CBC WITH DIFFERENTIAL/PLATELET
Abs Immature Granulocytes: 0.09 10*3/uL — ABNORMAL HIGH (ref 0.00–0.07)
Basophils Absolute: 0.1 10*3/uL (ref 0.0–0.1)
Basophils Relative: 0 %
Eosinophils Absolute: 0.2 10*3/uL (ref 0.0–0.5)
Eosinophils Relative: 2 %
HCT: 43.5 % (ref 36.0–46.0)
Hemoglobin: 14.3 g/dL (ref 12.0–15.0)
Immature Granulocytes: 1 %
Lymphocytes Relative: 23 %
Lymphs Abs: 3.7 10*3/uL (ref 0.7–4.0)
MCH: 29.5 pg (ref 26.0–34.0)
MCHC: 32.9 g/dL (ref 30.0–36.0)
MCV: 89.7 fL (ref 80.0–100.0)
Monocytes Absolute: 0.8 10*3/uL (ref 0.1–1.0)
Monocytes Relative: 5 %
Neutro Abs: 11.1 10*3/uL — ABNORMAL HIGH (ref 1.7–7.7)
Neutrophils Relative %: 69 %
Platelets: 349 10*3/uL (ref 150–400)
RBC: 4.85 MIL/uL (ref 3.87–5.11)
RDW: 13.2 % (ref 11.5–15.5)
WBC: 16 10*3/uL — ABNORMAL HIGH (ref 4.0–10.5)
nRBC: 0 % (ref 0.0–0.2)

## 2020-08-11 LAB — I-STAT BETA HCG BLOOD, ED (MC, WL, AP ONLY): I-stat hCG, quantitative: <5 m[IU]/mL (ref <5)

## 2020-08-11 LAB — D-DIMER, QUANTITATIVE: D-Dimer, Quant: 0.29 ug/mL-FEU (ref 0.00–0.50)

## 2020-08-11 LAB — TROPONIN I (HIGH SENSITIVITY): Troponin I (High Sensitivity): 2 ng/L (ref <18)

## 2020-08-11 IMAGING — DX DG CHEST 2V
2 series · 2 of 2 positions shown · non-contrast
Comparison: One-view chest x-ray [DATE].

CLINICAL DATA: Chest pain.

EXAM:
CHEST - 2 VIEW

[chest pa]
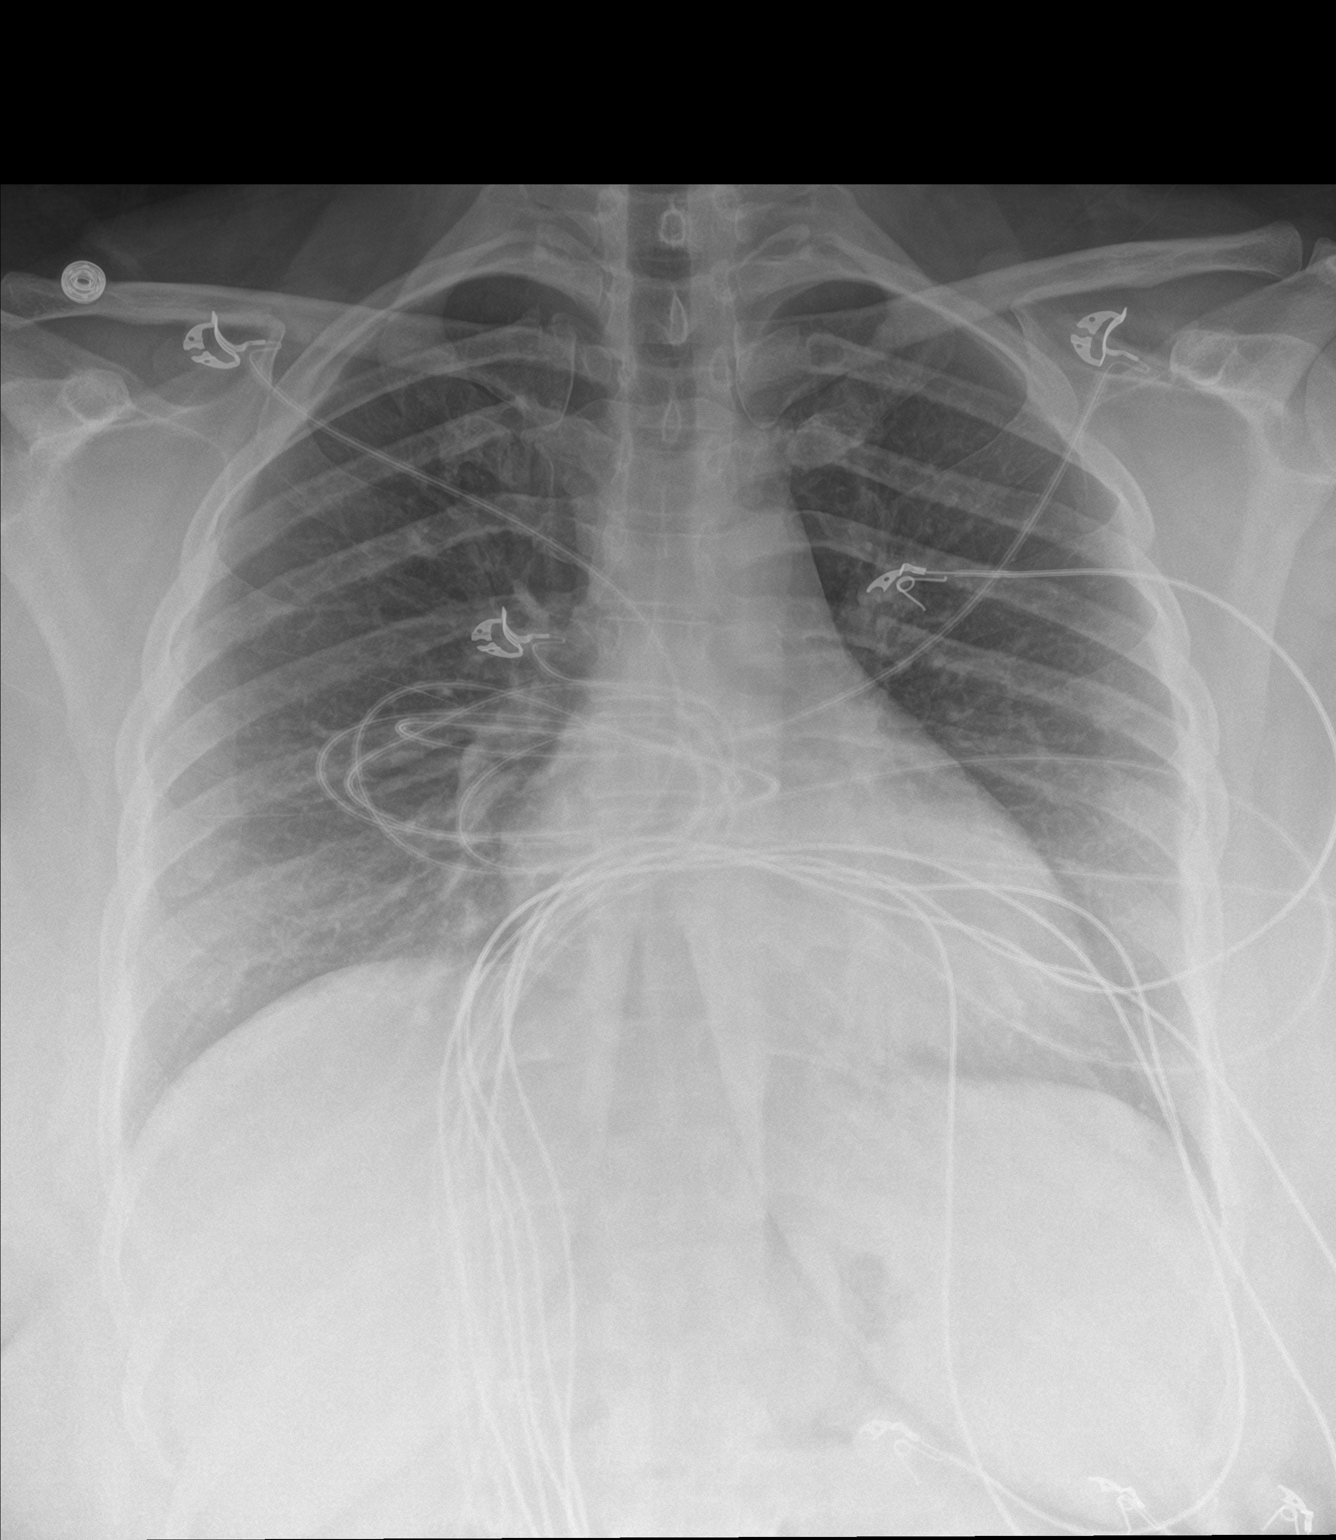

[chest lat]
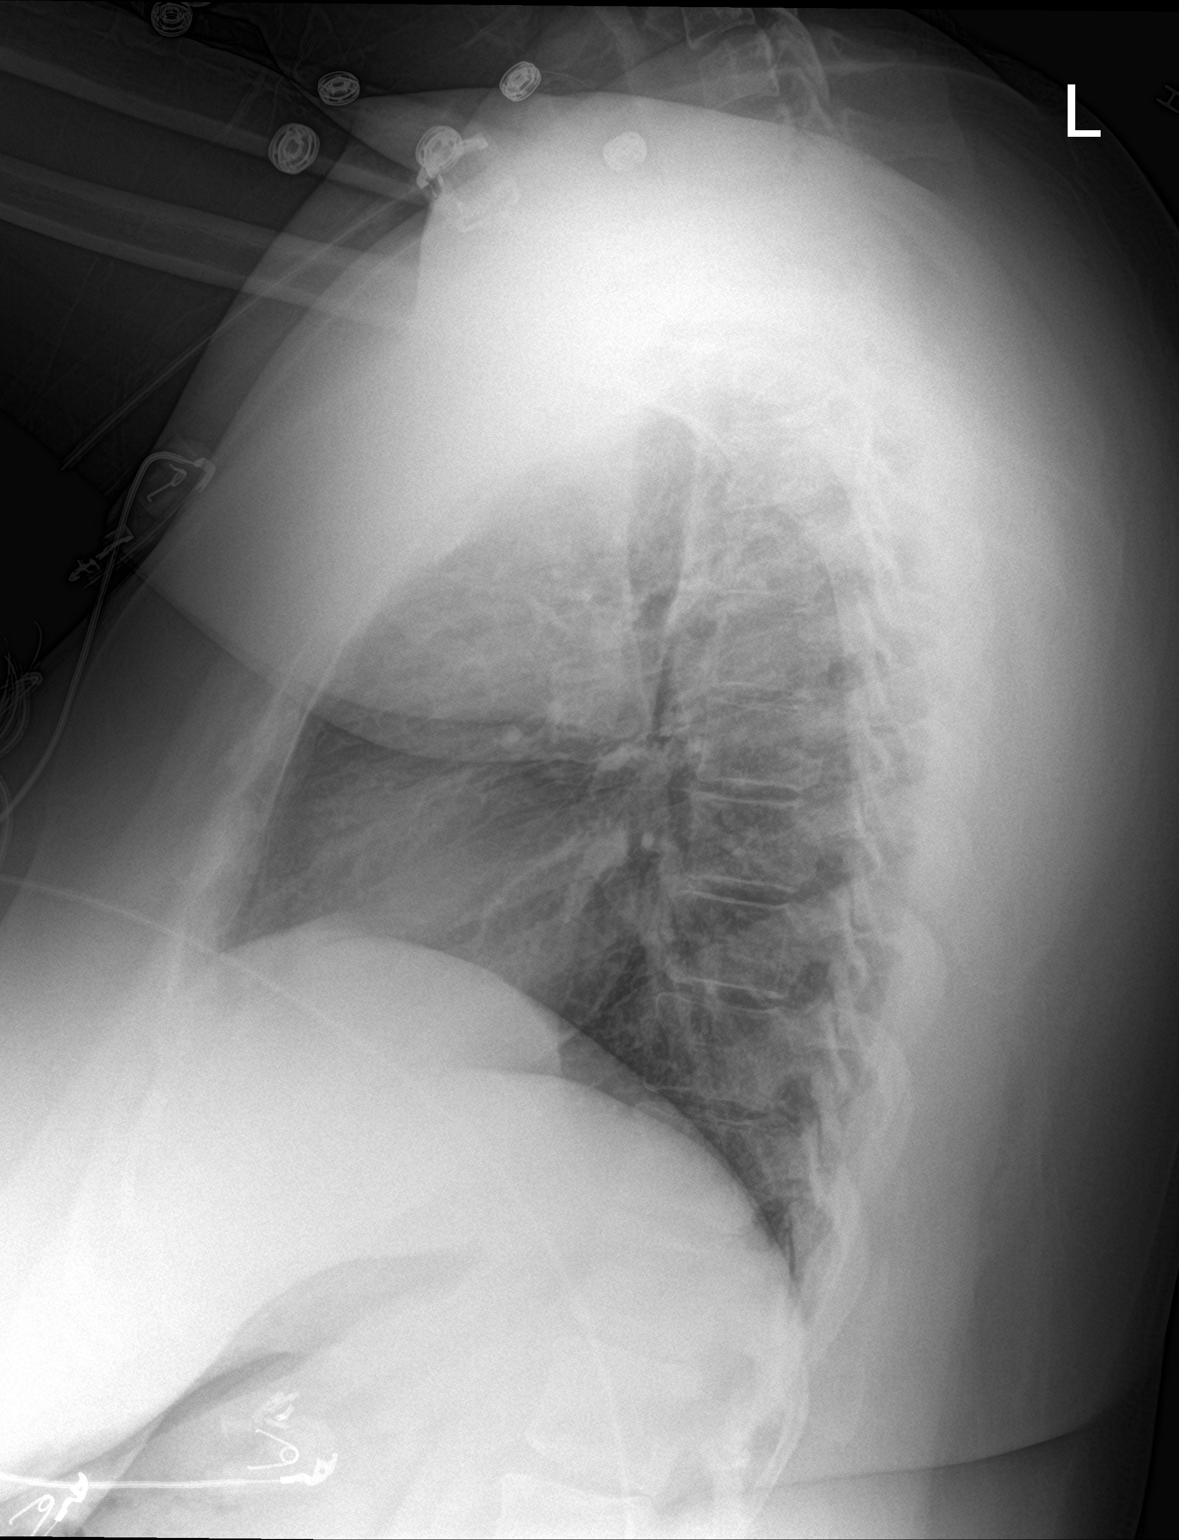

[2 of 2 positions shown; findings below may reference images not displayed]

FINDINGS: Heart size is normal. No edema or effusion is present. No focal
airspace disease is evident. Visualized soft tissues and bony thorax
are unremarkable.
IMPRESSION: No active cardiopulmonary disease.

## 2020-08-11 MED ORDER — IBUPROFEN 400 MG PO TABS
600.0000 mg | ORAL_TABLET | Freq: Once | ORAL | Status: AC
  Administered 2020-08-11: 600 mg via ORAL
  Filled 2020-08-11: qty 2

## 2020-08-11 MED ORDER — SODIUM CHLORIDE 0.9 % IV BOLUS
1000.0000 mL | Freq: Once | INTRAVENOUS | Status: AC
  Administered 2020-08-11: 1000 mL via INTRAVENOUS

## 2020-08-11 NOTE — ED Notes (Signed)
The nurse tried to establish an IV and draw blood work. Failed attempt and the patient transported to X-ray.

## 2020-08-11 NOTE — ED Triage Notes (Signed)
Stabbing pain in chest , squeezing in chest and pain in between her shoulder blades that started last night.

## 2020-08-11 NOTE — Telephone Encounter (Signed)
Patient states she started with sharp chest pains last night which now feels like a constant squeezing sensation. She has her 'usual tachycardia". She reports nausea and pain between her shoulder blades. She states she just does not feel right. Does not have ability to check BP or HR. I advised her to go to the ED but she declines stating she has been told in the past it was"just my tachycardia" .She does agree to go to an Urgent Care and will have husband take her.

## 2020-08-11 NOTE — ED Triage Notes (Signed)
Pt c/o cp that radiates between shoulder blades that started lastnight. Pt states she called cardiology and was told to come here.

## 2020-08-11 NOTE — ED Provider Notes (Signed)
[facility] EMERGENCY DEPARTMENT Provider Note   CSN: [id number] Arrival date & time: 08/11/20  6503     History Chief Complaint  Patient presents with  . Chest Pain    Dana Mcpherson is a 30 y.o. female.  HPI 30 year old female presents with chest pain.  Started last night.  Last night throughout the entire night she had palpitations and heart rate in the 160s.  This is a recurrent problem for her.  The pain originally came on as a sharp pain and then a squeezing and now is a dull pain that is rated as a 4 out of 10.  There is also concomitant back pain in between her shoulder blades.  No abdominal pain, shortness of breath, coughing or fever.  She feels lightheaded and off, though she is not sure if this is because she did not sleep last night.  Her heart no longer feels like it is racing.  No leg swelling. She was feeling lightheaded picking up boxes at work.   Past Medical History:  Diagnosis Date  . Allergy   . Anxiety   . Asthma   . Autoimmune disorder ([place])   . Complication of anesthesia    woke up during surgery & ithing after surgery  . Family history of adverse reaction to anesthesia    "My mom has the same trouble with anesthesia".  . GERD (gastroesophageal reflux disease)   . Migraine headache   . Nexplanon in place 10/05/2015   06/29/15 left arm  . Polycystic ovarian syndrome   . Pregnancy induced hypertension   . Tachycardia   . Vaginal Pap smear, abnormal     Patient Active Problem List   Diagnosis Date Noted  . Mild intermittent asthma, uncomplicated 54/65/6812  . Perennial allergic rhinitis 06/12/2020  . Anaphylactic shock due to adverse food reaction 06/12/2020  . Chronic urticaria 06/12/2020  . Early satiety 03/11/2020  . Abdominal pain 05/24/2019  . Abdominal bloating 02/20/2019  . Elevated LFTs 02/20/2019  . Gastroesophageal reflux disease 02/20/2019  . Dysphagia 02/20/2019  . Diarrhea 02/20/2019  . Atypical pneumonia 02/19/2016  . Pulmonary  infiltrates 02/18/2016  . Hemorrhoid 06/29/2015  . Shortness of breath 11/04/2014  . Tachycardia 10/07/2014  . ASCUS with positive high risk HPV 05/28/2014  . PCOS (polycystic ovarian syndrome) 03/03/2014  . Hyperinsulinemia 03/03/2014  . Morbid obesity ([place]) 05/23/2013  . DUB (dysfunctional uterine bleeding) 05/23/2013    Past Surgical History:  Procedure Laterality Date  . BIOPSY  06/19/2019   Procedure: BIOPSY;  Surgeon: Daneil Dolin, MD;  Location: [facility];  Service: Endoscopy;;  . CHOLECYSTECTOMY  2010  . COLONOSCOPY WITH PROPOFOL N/A 06/19/2019   Procedure: COLONOSCOPY WITH PROPOFOL;  Surgeon: Daneil Dolin, MD; 1 tubular adenoma, otherwise normal-appearing colonic mucosa, normal TI.  Stool sample for C. difficile negative.  Recommended repeat colonoscopy in 7 years.  . ESOPHAGOGASTRODUODENOSCOPY    . ESOPHAGOGASTRODUODENOSCOPY (EGD) WITH PROPOFOL N/A 04/08/2019   Procedure: ESOPHAGOGASTRODUODENOSCOPY (EGD) WITH PROPOFOL;  Surgeon: Daneil Dolin, MD; erosive reflux esophagitis s/p dilation, gastric mucosa congested diffusely with snakeskin appearance, antral erosions, congested eroded duodenal bulb, otherwise normal.  . MALONEY DILATION N/A 04/08/2019   Procedure: Venia Minks DILATION;  Surgeon: Daneil Dolin, MD;  Location: [facility];  Service: Endoscopy;  Laterality: N/A;  . POLYPECTOMY  06/19/2019   Procedure: POLYPECTOMY;  Surgeon: Daneil Dolin, MD;  [facility];  Service: Endoscopy;;  cecal polyp     OB History  Gravida  3   Para  2   Term  2   Preterm      AB  1   Living  2     SAB  1   IAB      Ectopic      Multiple  0   Live Births  2        Obstetric Comments  IOL for pre-e        Family History  Problem Relation Age of Onset  . Cancer Paternal Grandmother        ovarian cancer  . Asthma Son   . ADD / ADHD Son   . Colon cancer Other        late 51's    Social History   Tobacco Use  . Smoking status:  Former Smoker    Packs/day: 0.25    Years: 2.00    Pack years: 0.50    Types: Cigarettes    Quit date: 04/04/2010    Years since quitting: 10.3  . Smokeless tobacco: Never Used  Vaping Use  . Vaping Use: Never used  Substance Use Topics  . Alcohol use: No  . Drug use: No    Home Medications Prior to Admission medications   Medication Sig Start Date End Date Taking? Authorizing Provider  albuterol (VENTOLIN HFA) 108 (90 Base) MCG/ACT inhaler Inhale 1-2 puffs into the lungs every 6 (six) hours as needed. 05/14/20   Avegno, Darrelyn Hillock, FNP  EPINEPHrine (EPIPEN 2-PAK) 0.3 mg/0.3 mL IJ SOAJ injection Inject 0.3 mg into the muscle once as needed for anaphylaxis. 06/10/20   Valentina Shaggy, MD  esomeprazole (NEXIUM) 40 MG capsule Take 1 capsule (40 mg total) by mouth 2 (two) times daily before a meal. 03/11/20   Harper, Tivis Ringer, PA-C  Flaxseed, Linseed, (FLAXSEED OIL) 1200 MG CAPS Take 1,200 mg by mouth 2 (two) times daily.    [provider]  ivabradine (CORLANOR) 7.5 MG TABS tablet Take 1 tablet (7.5 mg total) by mouth 2 (two) times daily with a meal. 11/20/19   Strader, [place] M, PA-C  levonorgestrel-ethinyl estradiol (SEASONALE) 0.15-0.03 MG tablet Take 1 tablet by mouth daily. 08/03/20   Roma Schanz, CNM  Pediatric Vitamins (MULTIVITAMIN GUMMIES CHILDRENS PO) Take by mouth 2 (two) times daily.    [provider]  topiramate (TOPAMAX) 25 MG capsule Take 50 mg by mouth 2 (two) times daily.    [provider]    Allergies    Other, Shellfish allergy, Adhesive [tape], Eggs or egg-derived products, and Latex  Review of Systems   Review of Systems  Constitutional: Negative for fever.  Respiratory: Negative for cough and shortness of breath.   Cardiovascular: Positive for chest pain and palpitations. Negative for leg swelling.  Gastrointestinal: Negative for abdominal pain.  Musculoskeletal: Positive for back pain.  All other systems reviewed and  are negative.   Physical Exam Updated Vital Signs BP 123/69   Pulse 68   Temp 97.8 F (36.6 C) (Oral)   Resp 13   Ht 5\' 6"  (1.676 m)   Wt 111.1 kg   SpO2 100%   BMI 39.54 kg/m   Physical Exam Vitals and nursing note reviewed.  Constitutional:      General: She is not in acute distress.    Appearance: She is well-developed. She is not ill-appearing or diaphoretic.  HENT:     Head: Normocephalic and atraumatic.     Right Ear: External ear normal.  Left Ear: External ear normal.     Nose: Nose normal.  Eyes:     General:        Right eye: No discharge.        Left eye: No discharge.  Cardiovascular:     Rate and Rhythm: Normal rate and regular rhythm.     Heart sounds: Normal heart sounds.  Pulmonary:     Effort: Pulmonary effort is normal.     Breath sounds: Normal breath sounds.  Abdominal:     Palpations: Abdomen is soft.     Tenderness: There is no abdominal tenderness.  Musculoskeletal:     Cervical back: No tenderness.     Thoracic back: No tenderness.     Right lower leg: No edema.     Left lower leg: No edema.  Skin:    General: Skin is warm and dry.  Neurological:     Mental Status: She is alert.  Psychiatric:        Mood and Affect: Mood is not anxious.     ED Results / Procedures / Treatments   Labs (all labs ordered are listed, but only abnormal results are displayed) Labs Reviewed  CBC WITH DIFFERENTIAL/PLATELET - Abnormal; Notable for the following components:      Result Value   WBC 16.0 (*)    Neutro Abs 11.1 (*)    Abs Immature Granulocytes 0.09 (*)    All other components within normal limits  BASIC METABOLIC PANEL  D-DIMER, QUANTITATIVE  I-STAT BETA HCG BLOOD, ED ([facility], [facility], [facility] ONLY)  TROPONIN I (HIGH SENSITIVITY)  TROPONIN I (HIGH SENSITIVITY)    EKG EKG Interpretation  Date/Time:  Tuesday August 11 2020 09:19:32 EDT Ventricular Rate:  73 PR Interval:  159 QRS Duration: 92 QT Interval:  403 QTC Calculation: 445 R  Axis:   61 Text Interpretation: Sinus rhythm Low voltage, precordial leads similar to Dec 2020 Confirmed by Sherwood Gambler [phone]) on 08/11/2020 9:39:00 AM   Radiology DG Chest 2 View  Result Date: 08/11/2020 CLINICAL DATA:  Chest pain. EXAM: CHEST - 2 VIEW COMPARISON:  One-view chest x-ray 06/22/2020. FINDINGS: Heart size is normal. No edema or effusion is present. No focal airspace disease is evident. Visualized soft tissues and bony thorax are unremarkable. IMPRESSION: No active cardiopulmonary disease. Electronically Signed   By: San Morelle M.D.   On: 08/11/2020 10:18    Procedures Procedures   Medications Ordered in ED Medications  sodium chloride 0.9 % bolus 1,000 mL (1,000 mLs Intravenous New Bag/Given 08/11/20 1127)  ibuprofen (ADVIL) tablet 600 mg (600 mg Oral Given 08/11/20 1010)    ED Course  I have reviewed the triage vital signs and the nursing notes.  Pertinent labs & imaging results that were available during my care of the patient were reviewed by me and considered in my medical decision making (see chart for details).    MDM Rules/Calculators/A&P                          Patient has had continuous chest pain since last evening, which is over 9 hours and probably longer.  She also reports sustained heart rate in the 160s which probably is contributing to her feeling residual fatigue.  No infectious symptoms at this time.  She does have a nonspecific leukocytosis but given the now normal vitals and no other findings on work-up I do not think this represents acute sepsis or infection right now.  D-dimer obtained given the atypical chest pain and her being on OCPs but this is negative and otherwise low risk PE.  At this point, I think it is reasonable for her to be discharged home to follow-up with her cardiologist with return precautions. Final Clinical Impression(s) / ED Diagnoses Final diagnoses:  Nonspecific chest pain    Rx / DC Orders ED Discharge Orders     None       Sherwood Gambler, MD 08/11/20 1216

## 2020-08-11 NOTE — Discharge Instructions (Signed)
If you develop recurrent, continued, or worsening chest pain, shortness of breath, fever, vomiting, abdominal or back pain, or any other new/concerning symptoms then return to the ER for evaluation.  

## 2020-08-11 NOTE — ED Notes (Signed)
Lab and Nurse to perform [place] IV at bedside.

## 2020-08-11 NOTE — ED Notes (Signed)
Patient is being discharged from the Urgent Care and sent to the Emergency Department via pov. Per Faustino Congress, NP, patient is in need of higher level of care due to CP . Patient is aware and verbalizes understanding of plan of care. There were no vitals filed for this visit.  No EKG sone d/t  leads not available and on back order

## 2020-08-11 NOTE — Telephone Encounter (Signed)
New message     Pt c/o of Chest Pain: STAT if CP now or developed within 24 hours  1. Are you having CP right now? Yes feels like its being squeezed  2. Are you experiencing any other symptoms (ex. SOB, nausea, vomiting, sweating)? Headache and heart racing   3. How long have you been experiencing CP? Last night -started with sharp pain and not feeling well   4. Is your CP continuous or coming and going?continuous   5. Have you taken Nitroglycerin? No  ?

## 2020-08-18 ENCOUNTER — Encounter: Payer: Self-pay | Admitting: Obstetrics & Gynecology

## 2020-08-18 ENCOUNTER — Other Ambulatory Visit: Payer: Self-pay

## 2020-08-18 ENCOUNTER — Ambulatory Visit: Payer: 59 | Admitting: Obstetrics & Gynecology

## 2020-08-18 VITALS — BP 124/81 | HR 95 | Wt 246.0 lb

## 2020-08-18 DIAGNOSIS — Z3009 Encounter for other general counseling and advice on contraception: Secondary | ICD-10-CM

## 2020-08-18 NOTE — Progress Notes (Unsigned)
Chief Complaint  Patient presents with  . Pre-op Exam    BTL      30 y.o. [id number] No LMP recorded. (Menstrual status: Oral contraceptives). The current method of family planning is {contraception:315051}.  Outpatient Encounter Medications as of 08/18/2020  Medication Sig  . albuterol (VENTOLIN HFA) 108 (90 Base) MCG/ACT inhaler Inhale 1-2 puffs into the lungs every 6 (six) hours as needed.  Marland Kitchen EPINEPHrine (EPIPEN 2-PAK) 0.3 mg/0.3 mL IJ SOAJ injection Inject 0.3 mg into the muscle once as needed for anaphylaxis.  Marland Kitchen esomeprazole (NEXIUM) 40 MG capsule Take 1 capsule (40 mg total) by mouth 2 (two) times daily before a meal.  . Flaxseed, Linseed, (FLAXSEED OIL) 1200 MG CAPS Take 1,200 mg by mouth 2 (two) times daily.  . ivabradine (CORLANOR) 7.5 MG TABS tablet Take 1 tablet (7.5 mg total) by mouth 2 (two) times daily with a meal.  . levonorgestrel-ethinyl estradiol (SEASONALE) 0.15-0.03 MG tablet Take 1 tablet by mouth daily.  . Pediatric Vitamins (MULTIVITAMIN GUMMIES CHILDRENS PO) Take by mouth 2 (two) times daily.  Marland Kitchen topiramate (TOPAMAX) 25 MG capsule Take 50 mg by mouth 2 (two) times daily.   No facility-administered encounter medications on file as of 08/18/2020.    Subjective *** Past Medical History:  Diagnosis Date  . Allergy   . Anxiety   . Asthma   . Autoimmune disorder ([place])   . Complication of anesthesia    woke up during surgery & ithing after surgery  . Family history of adverse reaction to anesthesia    "My mom has the same trouble with anesthesia".  . GERD (gastroesophageal reflux disease)   . Migraine headache   . Nexplanon in place 10/05/2015   06/29/15 left arm  . Polycystic ovarian syndrome   . Pregnancy induced hypertension   . Tachycardia   . Vaginal Pap smear, abnormal     Past Surgical History:  Procedure Laterality Date  . BIOPSY  06/19/2019   Procedure: BIOPSY;  Surgeon: Daneil Dolin, MD;  [facility];  Service: Endoscopy;;  .  CHOLECYSTECTOMY  2010  . COLONOSCOPY WITH PROPOFOL N/A 06/19/2019   Procedure: COLONOSCOPY WITH PROPOFOL;  Surgeon: Daneil Dolin, MD; 1 tubular adenoma, otherwise normal-appearing colonic mucosa, normal TI.  Stool sample for C. difficile negative.  Recommended repeat colonoscopy in 7 years.  . ESOPHAGOGASTRODUODENOSCOPY    . ESOPHAGOGASTRODUODENOSCOPY (EGD) WITH PROPOFOL N/A 04/08/2019   Procedure: ESOPHAGOGASTRODUODENOSCOPY (EGD) WITH PROPOFOL;  Surgeon: Daneil Dolin, MD; erosive reflux esophagitis s/p dilation, gastric mucosa congested diffusely with snakeskin appearance, antral erosions, congested eroded duodenal bulb, otherwise normal.  . MALONEY DILATION N/A 04/08/2019   Procedure: Venia Minks DILATION;  Surgeon: Daneil Dolin, MD;  [facility];  Service: Endoscopy;  Laterality: N/A;  . POLYPECTOMY  06/19/2019   Procedure: POLYPECTOMY;  Surgeon: Daneil Dolin, MD;  [facility];  Service: Endoscopy;;  cecal polyp    OB History    Gravida  3   Para  2   Term  2   Preterm      AB  1   Living  2     SAB  1   IAB      Ectopic      Multiple  0   Live Births  2        Obstetric Comments  IOL for pre-e        Allergies  Allergen Reactions  . Other  Anaphylaxis and Other (See Comments)    Pt states that she is allergic to Axe/Tag body spray and Lysol.    General anesthesia - pt woke up several times, was scratching during surgery. Itching   Allergy to sun and bug bites.  . Shellfish Allergy Anaphylaxis and Hives  . Adhesive [Tape] Hives  . Eggs Or Egg-Derived Products Itching and Nausea Only  . Latex Itching    Social History   Socioeconomic History  . Marital status: Married    Spouse name: Not on file  . Number of children: Not on file  . Years of education: Not on file  . Highest education level: Not on file  Occupational History  . Not on file  Tobacco Use  . Smoking status: Former Smoker    Packs/day: 0.25    Years:  2.00    Pack years: 0.50    Types: Cigarettes    Quit date: 04/04/2010    Years since quitting: 10.3  . Smokeless tobacco: Never Used  Vaping Use  . Vaping Use: Never used  Substance and Sexual Activity  . Alcohol use: No  . Drug use: No  . Sexual activity: Yes    Birth control/protection: Pill  Other Topics Concern  . Not on file  Social History Narrative  . Not on file   Social Determinants of Health   Financial Resource Strain: Low Risk   . Difficulty of Paying Living Expenses: Not hard at all  Food Insecurity: No Food Insecurity  . Worried About Charity fundraiser in the Last Year: Never true  . Ran Out of Food in the Last Year: Never true  Transportation Needs: No Transportation Needs  . Lack of Transportation (Medical): No  . Lack of Transportation (Non-Medical): No  Physical Activity: Unknown  . Days of Exercise per Week: Patient refused  . Minutes of Exercise per Session: Patient refused  Stress: Stress Concern Present  . Feeling of Stress : To some extent  Social Connections: Unknown  . Frequency of Communication with Friends and Family: More than three times a week  . Frequency of Social Gatherings with Friends and Family: Once a week  . Attends Religious Services: Patient refused  . Active Member of Clubs or Organizations: Patient refused  . Attends Archivist Meetings: Patient refused  . Marital Status: Married    Family History  Problem Relation Age of Onset  . Cancer Paternal Grandmother        ovarian cancer  . Asthma Son   . ADD / ADHD Son   . Colon cancer Other        late 70's    Medications:       Current Outpatient Medications:  .  albuterol (VENTOLIN HFA) 108 (90 Base) MCG/ACT inhaler, Inhale 1-2 puffs into the lungs every 6 (six) hours as needed., Disp: 18 g, Rfl: 0 .  EPINEPHrine (EPIPEN 2-PAK) 0.3 mg/0.3 mL IJ SOAJ injection, Inject 0.3 mg into the muscle once as needed for anaphylaxis., Disp: 2 each, Rfl: 1 .  esomeprazole  (NEXIUM) 40 MG capsule, Take 1 capsule (40 mg total) by mouth 2 (two) times daily before a meal., Disp: 60 capsule, Rfl: 5 .  Flaxseed, Linseed, (FLAXSEED OIL) 1200 MG CAPS, Take 1,200 mg by mouth 2 (two) times daily., Disp: , Rfl:  .  ivabradine (CORLANOR) 7.5 MG TABS tablet, Take 1 tablet (7.5 mg total) by mouth 2 (two) times daily with a meal., Disp: 60 tablet, Rfl: 11 .  levonorgestrel-ethinyl estradiol (SEASONALE) 0.15-0.03 MG tablet, Take 1 tablet by mouth daily., Disp: 91 tablet, Rfl: 3 .  Pediatric Vitamins (MULTIVITAMIN GUMMIES CHILDRENS PO), Take by mouth 2 (two) times daily., Disp: , Rfl:  .  topiramate (TOPAMAX) 25 MG capsule, Take 50 mg by mouth 2 (two) times daily., Disp: , Rfl:   Objective Blood pressure 124/81, pulse 95, weight 246 lb (111.6 kg).  ***  Pertinent ROS ***  Labs or studies ***    Impression Diagnoses this Encounter::   ICD-10-CM   1. Encounter for consultation for female sterilization  Z30.09     Established relevant diagnosis(es): ***  Plan/Recommendations: No orders of the defined types were placed in this encounter.   Labs or Scans Ordered: No orders of the defined types were placed in this encounter.   Management:: ***  Follow up Return in about 3 months (around 11/06/2020) for [place] visit, Post Op, with Dr Elonda Husky.     {20233: 10 min     99213: 15 min      99214:25 min}   Face to face time:  *** minutes  Greater than 50% of the visit time was spent in counseling and coordination of care with the patient.  The summary and outline of the counseling and care coordination is summarized in the note above.   All questions were answered.

## 2020-08-20 ENCOUNTER — Ambulatory Visit: Payer: 59 | Admitting: Family Medicine

## 2020-08-20 NOTE — Progress Notes (Signed)
Cardiology Office Note  Date: 08/21/2020   ID: Dana Mcpherson, DOB 12-20-1990, MRN [id number]  PCP:  [facility]  Cardiologist:  No primary care provider on file. Electrophysiologist:  None   Chief Complaint: Urgent care follow up  History of Present Illness: Dana Mcpherson is a 30 y.o. female with a history of inappropriate tachycardia.,  GERD, migraine headaches, PCOS, PIH, asthma, anxiety.  Last seen by Kerin Ransom, PA on 03/03/2020.  She had been initially diagnosed with inappropriate sinus tachycardia in 2016 after the birth of her child.  Since that time she had been unable to exercise.  It was mentioned that at some point tachycardia was so bad she would pass out every time she tried to exercise.  She did been placed on Corlanor.  Her resting tachycardia improved.  She had gradually gaining weight with a BMI at 37.  She presented to urgent care on 08/11/2020 with complaints of chest pain which started the previous night.  Throughout the entire night she had palpitations and a heart rate in the 160s.  She described the pain as sharp then squeezing and subsequently dull pain rated at a 4 out of 10 in severity.  She had concomitant back pain between her shoulder blades.  She felt lightheaded and off.  She was feeling lightheaded when picking up boxes at work.Her basic metabolic panel was normal.  Beta hCG was negative.  Troponin negative .  CBC demonstrated leukocytosis with WBC 16.  D-dimer was negative at 0.29   Patient is here for urgent care follow-up for rapid heart rate and chest pain.  She states she believes the reason she was having tachycardia and chest pain was because she was on high-dose steroids secondary to treatment for bilateral ear infection and sinus infection by primary care physician.  She states since she stopped the steroids her heart rate has returned back to normal she has had no more chest pain.  Heart rate today is 89 on arrival with blood pressure  118/70.  Her chest pain resolved as soon as her heart rate returned to normal.  She states she has been taking Corlanor for a long time for her inappropriate sinus tachycardia.  She states Corlanor has been controlling her heart rate up until she received steroids for her recent infection.  She denies any more palpitations arrhythmias.  No lightheadedness or dizziness.  No CVA or TIA-like symptoms, no PND, orthopnea and no shortness of breath or DOE.    Past Medical History:  Diagnosis Date  . Allergy   . Anxiety   . Asthma   . Autoimmune disorder ([place])   . Complication of anesthesia    woke up during surgery & ithing after surgery  . Family history of adverse reaction to anesthesia    "My mom has the same trouble with anesthesia".  . GERD (gastroesophageal reflux disease)   . Migraine headache   . Nexplanon in place 10/05/2015   06/29/15 left arm  . Polycystic ovarian syndrome   . Pregnancy induced hypertension   . Tachycardia   . Vaginal Pap smear, abnormal     Past Surgical History:  Procedure Laterality Date  . BIOPSY  06/19/2019   Procedure: BIOPSY;  Surgeon: Daneil Dolin, MD;  [facility]: [facility];  Service: Endoscopy;;  . CHOLECYSTECTOMY  2010  . COLONOSCOPY WITH PROPOFOL N/A 06/19/2019   Procedure: COLONOSCOPY WITH PROPOFOL;  Surgeon: Daneil Dolin, MD; 1 tubular adenoma, otherwise normal-appearing colonic mucosa,  normal TI.  Stool sample for C. difficile negative.  Recommended repeat colonoscopy in 7 years.  . ESOPHAGOGASTRODUODENOSCOPY    . ESOPHAGOGASTRODUODENOSCOPY (EGD) WITH PROPOFOL N/A 04/08/2019   Procedure: ESOPHAGOGASTRODUODENOSCOPY (EGD) WITH PROPOFOL;  Surgeon: Daneil Dolin, MD; erosive reflux esophagitis s/p dilation, gastric mucosa congested diffusely with snakeskin appearance, antral erosions, congested eroded duodenal bulb, otherwise normal.  . MALONEY DILATION N/A 04/08/2019   Procedure: Venia Minks DILATION;  Surgeon: Daneil Dolin, MD;  [facility];  Service: Endoscopy;  Laterality: N/A;  . POLYPECTOMY  06/19/2019   Procedure: POLYPECTOMY;  Surgeon: Daneil Dolin, MD;  [facility];  Service: Endoscopy;;  cecal polyp    Current Outpatient Medications  Medication Sig Dispense Refill  . albuterol (VENTOLIN HFA) 108 (90 Base) MCG/ACT inhaler Inhale 1-2 puffs into the lungs every 6 (six) hours as needed. 18 g 0  . EPINEPHrine (EPIPEN 2-PAK) 0.3 mg/0.3 mL IJ SOAJ injection Inject 0.3 mg into the muscle once as needed for anaphylaxis. 2 each 1  . esomeprazole (NEXIUM) 40 MG capsule Take 1 capsule (40 mg total) by mouth 2 (two) times daily before a meal. 60 capsule 5  . Flaxseed, Linseed, (FLAXSEED OIL) 1200 MG CAPS Take 1,200 mg by mouth 2 (two) times daily.    . ivabradine (CORLANOR) 7.5 MG TABS tablet Take 1 tablet (7.5 mg total) by mouth 2 (two) times daily with a meal. 60 tablet 11  . levonorgestrel-ethinyl estradiol (SEASONALE) 0.15-0.03 MG tablet Take 1 tablet by mouth daily. 91 tablet 3  . Pediatric Vitamins (MULTIVITAMIN GUMMIES CHILDRENS PO) Take by mouth 2 (two) times daily.    Marland Kitchen topiramate (TOPAMAX) 25 MG capsule Take 50 mg by mouth 2 (two) times daily.     No current facility-administered medications for this visit.   Allergies:  Other, Shellfish allergy, Adhesive [tape], Eggs or egg-derived products, and Latex   Social History: The patient  reports that she quit smoking about 10 years ago. Her smoking use included cigarettes. She has a 0.50 pack-year smoking history. She has never used smokeless tobacco. She reports that she does not drink alcohol and does not use drugs.   Family History: The patient's family history includes ADD / ADHD in her son; Asthma in her son; Cancer in her paternal grandmother; Colon cancer in an other family member.   ROS:  Please see the history of present illness. Otherwise, complete review of systems is positive for none.  All other systems are reviewed and negative.    Physical Exam: VS:  BP 118/70   Pulse 89   Ht 5' 6.5" (1.689 m)   Wt 244 lb (110.7 kg)   SpO2 99%   BMI 38.79 kg/m , BMI Body mass index is 38.79 kg/m.  Wt Readings from Last 3 Encounters:  08/21/20 244 lb (110.7 kg)  08/18/20 246 lb (111.6 kg)  08/11/20 245 lb (111.1 kg)    General: Patient appears comfortable at rest. Neck: Supple, no elevated JVP or carotid bruits, no thyromegaly. Lungs: Clear to auscultation, nonlabored breathing at rest. Cardiac: Regular rate and rhythm, no S3 or significant systolic murmur, no pericardial rub. Extremities: No pitting edema, distal pulses 2+. Skin: Warm and dry. Musculoskeletal: No kyphosis. Neuropsychiatric: Alert and oriented x3, affect grossly appropriate.  ECG:  EKG August 11, 2020 [facility], ER.  Sinus rhythm rate of 73  Recent Labwork: 07/09/2020: ALT 43; AST 41 08/11/2020: BUN 10; Creatinine, Ser 0.81; Hemoglobin 14.3; Platelets 349; Potassium 4.2; Sodium  138  No results found for: CHOL, TRIG, HDL, CHOLHDL, VLDL, LDLCALC, LDLDIRECT  Other Studies Reviewed Today:   Assessment and Plan:  1. Tachycardia   2. Chest pain of uncertain etiology    1. Tachycardia Recent urgent care visit for tachycardia with heart rate in the 160s.  Patient was suffering from recent bilateral ear infections and sinusitis.  She has been treated with higher dose steroids for the infection.  She believes this was the cause of the sinus tachycardia.  Since completing the steroid therapy with heart rate has returned to normal and she has had no further episodes of tachycardia.  Continue Corlanor 7.5 mg p.o. twice daily.  Heart rate today is 89.  She denies any further tachycardia or palpitations.  2. Chest pain of uncertain etiology Recent urgent care visit for significant tachycardia with associated chest pain/back pain.  Patient states once her tachycardia resolved the chest and back pain resolved.  She states she was on high-dose steroids for recent  bilateral ear infections and sinusitis.  No further episodes of chest pain since resolution of tachycardia.   Medication Adjustments/Labs and Tests Ordered: Current medicines are reviewed at length with the patient today.  Concerns regarding medicines are outlined above.   Disposition: Follow-up with Dr. Harl Bowie or [facility] 6 months  Signed, Levell July, NP 08/21/2020 3:12 PM    [facility] [place], [place], [place] [address] Phone: [phone]; Fax: [phone]

## 2020-08-21 ENCOUNTER — Ambulatory Visit (INDEPENDENT_AMBULATORY_CARE_PROVIDER_SITE_OTHER): Payer: 59 | Admitting: Family Medicine

## 2020-08-21 ENCOUNTER — Encounter: Payer: Self-pay | Admitting: Family Medicine

## 2020-08-21 ENCOUNTER — Other Ambulatory Visit: Payer: Self-pay

## 2020-08-21 VITALS — BP 118/70 | HR 89 | Ht 66.5 in | Wt 244.0 lb

## 2020-08-21 DIAGNOSIS — R000 Tachycardia, unspecified: Secondary | ICD-10-CM | POA: Diagnosis not present

## 2020-08-21 DIAGNOSIS — R079 Chest pain, unspecified: Secondary | ICD-10-CM

## 2020-08-21 DIAGNOSIS — D72829 Elevated white blood cell count, unspecified: Secondary | ICD-10-CM

## 2020-08-21 NOTE — Patient Instructions (Addendum)
Medication Instructions:  Continue all current medications.   Labwork: none  Testing/Procedures: none  Follow-Up: 6 months   Any Other Special Instructions Will Be Listed Below (If Applicable).   If you need a refill on your cardiac medications before your next appointment, please call your pharmacy.  

## 2020-08-31 ENCOUNTER — Ambulatory Visit (INDEPENDENT_AMBULATORY_CARE_PROVIDER_SITE_OTHER): Payer: 59 | Admitting: Internal Medicine

## 2020-08-31 ENCOUNTER — Ambulatory Visit: Payer: Medicaid Other | Admitting: Internal Medicine

## 2020-08-31 ENCOUNTER — Encounter: Payer: Self-pay | Admitting: Internal Medicine

## 2020-08-31 ENCOUNTER — Other Ambulatory Visit: Payer: Self-pay

## 2020-08-31 VITALS — BP 124/80 | HR 68 | Ht 66.0 in | Wt 245.0 lb

## 2020-08-31 DIAGNOSIS — L508 Other urticaria: Secondary | ICD-10-CM | POA: Diagnosis not present

## 2020-08-31 DIAGNOSIS — R768 Other specified abnormal immunological findings in serum: Secondary | ICD-10-CM | POA: Diagnosis not present

## 2020-08-31 DIAGNOSIS — M549 Dorsalgia, unspecified: Secondary | ICD-10-CM | POA: Diagnosis not present

## 2020-08-31 DIAGNOSIS — R29898 Other symptoms and signs involving the musculoskeletal system: Secondary | ICD-10-CM | POA: Diagnosis not present

## 2020-08-31 DIAGNOSIS — L732 Hidradenitis suppurativa: Secondary | ICD-10-CM

## 2020-08-31 NOTE — Progress Notes (Signed)
Office Visit Note  Patient: Dana Mcpherson             Date of Birth: 03-07-1991           MRN: [id number]             PCP: [facility] Referring: Valentina Shaggy, * Visit Date: 08/31/2020 Occupation: Middle school cook  Subjective:  New Patient (Initial Visit) (Patient complains of neck and upper back pain and bilateral hand pain. Patient notices she drops things often and notices tingling in bilateral hands. )   History of Present Illness: Dana N Closson is a 30 y.o. female here for evaluation of positive ANA with chronic urticaria and bilateral hand pain. She has experienced upper back pain chronically for years that she attributes more to her weight and is not a new development. She reports a problem with clumsiness for since she was a child with occasional falls and dropping things unintentionally.  However she is recently been noticing more numbness and tingling sensation in her hands and a lot more trouble with dropping objects.  She first recalls developing the symptoms during her pregnancy and says this is initially thought to be something that would improve somewhat after pregnancy but has been more of a persistent problem.  She has swelling chronically around her hands and forearms typically with erythema and frequent urticarial rashes that seem to worsen with a large variety of exposures.  She does describe cystic skin lesions and hidradenitis symptoms involving her groin, axilla, and intertriginous areas.  She does not describe any photosensitive rashes or lesions.  Lab testing showed mildly positive ANA titer she is also chronically had elevations in white blood cell count and inflammatory markers without a specific attribution.  Labs reviewed 06/2020 ANA 1:80 homogenous ESR 38 CRP 32 AST 41 ALT 43   Activities of Daily Living:  Patient reports morning stiffness for 0 minutes.   Patient Denies nocturnal pain.  Difficulty dressing/grooming:  Denies Difficulty climbing stairs: Denies Difficulty getting out of chair: Denies Difficulty using hands for taps, buttons, cutlery, and/or writing: Reports  Review of Systems  Constitutional: Positive for fatigue.  HENT: Negative for mouth sores, mouth dryness and nose dryness.   Eyes: Positive for visual disturbance. Negative for pain, itching and dryness.  Respiratory: Positive for shortness of breath and difficulty breathing. Negative for cough and hemoptysis.   Cardiovascular: Positive for chest pain and palpitations. Negative for swelling in legs/feet.  Gastrointestinal: Positive for constipation and diarrhea. Negative for abdominal pain and blood in stool.  Endocrine: Negative for increased urination.  Genitourinary: Negative for painful urination.  Musculoskeletal: Positive for arthralgias, joint pain, joint swelling, myalgias and myalgias. Negative for muscle weakness, morning stiffness and muscle tenderness.  Skin: Positive for color change, rash, nodules/bumps and redness.  Allergic/Immunologic: Positive for susceptible to infections.  Neurological: Positive for dizziness, headaches, parasthesias and weakness. Negative for numbness and memory loss.  Hematological: Negative for swollen glands.  Psychiatric/Behavioral: Positive for sleep disturbance. Negative for confusion.    PMFS History:  Patient Active Problem List   Diagnosis Date Noted  . Positive ANA (antinuclear antibody) 08/31/2020  . Weakness of both hands 08/31/2020  . Upper back pain 08/31/2020  . Mild intermittent asthma, uncomplicated 40/11/6759  . Perennial allergic rhinitis 06/12/2020  . Anaphylactic shock due to adverse food reaction 06/12/2020  . Chronic urticaria 06/12/2020  . Early satiety 03/11/2020  . Abdominal pain 05/24/2019  . Abdominal bloating 02/20/2019  . Elevated LFTs  02/20/2019  . Gastroesophageal reflux disease 02/20/2019  . Dysphagia 02/20/2019  . Diarrhea 02/20/2019  . Atypical  pneumonia 02/19/2016  . Pulmonary infiltrates 02/18/2016  . Hemorrhoid 06/29/2015  . Hidradenitis suppurativa 06/23/2015  . Shortness of breath 11/04/2014  . Tachycardia 10/07/2014  . ASCUS with positive high risk HPV 05/28/2014  . PCOS (polycystic ovarian syndrome) 03/03/2014  . Hyperinsulinemia 03/03/2014  . Morbid obesity ([facility]) 05/23/2013  . DUB (dysfunctional uterine bleeding) 05/23/2013    Past Medical History:  Diagnosis Date  . Allergy   . Anxiety   . Asthma   . Autoimmune disorder ([facility])   . Complication of anesthesia    woke up during surgery & ithing after surgery  . Family history of adverse reaction to anesthesia    "My mom has the same trouble with anesthesia".  . GERD (gastroesophageal reflux disease)   . Migraine headache   . Nexplanon in place 10/05/2015   06/29/15 left arm  . Polycystic ovarian syndrome   . Pregnancy induced hypertension   . Tachycardia   . Vaginal Pap smear, abnormal     Family History  Problem Relation Age of Onset  . Cancer Paternal Grandmother        ovarian cancer  . Other Mother        Ovarian Tumor  . Hypertension Father   . Asthma Son   . ADD / ADHD Son   . Asthma Son   . Allergies Son   . Colon cancer Other        late 70's  . Diabetes Sister   . Healthy Brother    Past Surgical History:  Procedure Laterality Date  . BIOPSY  06/19/2019   Procedure: BIOPSY;  Surgeon: Rourk, Robert M, MD;  [facility];  Service: Endoscopy;;  . CHOLECYSTECTOMY  2010  . COLONOSCOPY WITH PROPOFOL N/A 06/19/2019   Procedure: COLONOSCOPY WITH PROPOFOL;  Surgeon: Rourk, Robert M, MD; 1 tubular adenoma, otherwise normal-appearing colonic mucosa, normal TI.  Stool sample for C. difficile negative.  Recommended repeat colonoscopy in 7 years.  . ESOPHAGOGASTRODUODENOSCOPY    . ESOPHAGOGASTRODUODENOSCOPY (EGD) WITH PROPOFOL N/A 04/08/2019   Procedure: ESOPHAGOGASTRODUODENOSCOPY (EGD) WITH PROPOFOL;  Surgeon: Rourk, Robert M, MD; erosive  reflux esophagitis s/p dilation, gastric mucosa congested diffusely with snakeskin appearance, antral erosions, congested eroded duodenal bulb, otherwise normal.  . MALONEY DILATION N/A 04/08/2019   Procedure: MALONEY DILATION;  Surgeon: Rourk, Robert M, MD;  [facility];  Service: Endoscopy;  Laterality: N/A;  . POLYPECTOMY  06/19/2019   Procedure: POLYPECTOMY;  Surgeon: Rourk, Robert M, MD;  [facility];  Service: Endoscopy;;  cecal polyp   Social History   Social History Narrative  . Not on file   Immunization History  Administered Date(s) Administered  . HPV Quadrivalent 06/22/2011  . Influenza-Unspecified 03/01/2006  . Td 07/25/2002     Objective: Vital Signs: BP 124/80 (BP Location: Right Arm, Patient Position: Sitting, Cuff Size: Normal)   Pulse 68   Ht 5' 6" (1.676 m)   Wt 245 lb (111.1 kg)   BMI 39.54 kg/m    Physical Exam Constitutional:      Appearance: She is obese.  HENT:     Right Ear: External ear normal.     Left Ear: External ear normal.     Mouth/Throat:     Mouth: Mucous membranes are moist.     Pharynx: Oropharynx is clear.  Eyes:     Conjunctiva/sclera: Conjunctivae normal.    Cardiovascular:     Rate and Rhythm: Normal rate and regular rhythm.  Pulmonary:     Effort: Pulmonary effort is normal.     Breath sounds: Normal breath sounds.  Skin:    General: Skin is warm and dry.     Comments: Central facial erythema with few telangiectasias present no lesions Normal nallfold capillaroscopy   Neurological:     General: No focal deficit present.     Mental Status: She is alert.  Psychiatric:        Mood and Affect: Mood normal.    Musculoskeletal Exam:  Neck full ROM no tenderness Shoulders full ROM no tenderness or swelling Elbows full ROM no tenderness or swelling Wrists full ROM, Positive tinel sign at right wrist, negative on left, negative phalen sign b/l, mild soft tissue swelling and erythema diffusely over distal  forearm and hands Fingers full ROM no tenderness or swelling Knees full ROM no tenderness or swelling Ankles full ROM no tenderness or swelling  Investigation: No additional findings.  Imaging: DG Chest 2 View  Result Date: 08/11/2020 CLINICAL DATA:  Chest pain. EXAM: CHEST - 2 VIEW COMPARISON:  One-view chest x-ray 06/22/2020. FINDINGS: Heart size is normal. No edema or effusion is present. No focal airspace disease is evident. Visualized soft tissues and bony thorax are unremarkable. IMPRESSION: No active cardiopulmonary disease. Electronically Signed   By: Christopher  Mattern M.D.   On: 08/11/2020 10:18    Recent Labs: Lab Results  Component Value Date   WBC 16.0 (H) 08/11/2020   HGB 14.3 08/11/2020   PLT 349 08/11/2020   NA 138 08/11/2020   K 4.2 08/11/2020   CL 106 08/11/2020   CO2 24 08/11/2020   GLUCOSE 84 08/11/2020   BUN 10 08/11/2020   CREATININE 0.81 08/11/2020   BILITOT 0.4 07/09/2020   ALKPHOS 75 07/09/2020   AST 41 (H) 07/09/2020   ALT 43 (H) 07/09/2020   PROT 7.3 07/09/2020   ALBUMIN 4.8 07/09/2020   CALCIUM 9.0 08/11/2020   GFRAA >60 05/19/2019    Speciality Comments: No specialty comments available.  Procedures:  No procedures performed Allergies: Other, Shellfish allergy, Adhesive [tape], Eggs or egg-derived products, and Latex   Assessment / Plan:     Visit Diagnoses: Positive ANA (antinuclear antibody) - Plan: RNP Antibody, Anti-Smith antibody, Sjogrens syndrome-A extractable nuclear antibody, Sjogrens syndrome-B extractable nuclear antibody, Anti-DNA antibody, double-stranded, C3 and C4  Positive ANA test without specific clinical criteria for systemic connective tissue disease at this time. Positive ANA can be seen in associated with chronic urticaria without underlying CTD although has negative CU index.  We will check ENA panel RNP, Smith, SSA, SSB, dsDNA, complements at this time.  Chronic urticaria  Not entirely clear which provocative  agents, no history of angioedema accompanying. She has incomplete control on current antihistamine use. Can be associated with arthralgias but would not explain functional issue. No history features suggesting vasculitic urticaria.  Weakness of both hands  Ultrasound inspection is not obvious for CTS change in the wrists to correlate with numbness and weakness and dropping. If workup is no revealing, could likely benefit with nerve conduction study to identify the exact source of this problem.  Hidradenitis suppurativa  Chronic HS currently with cystic lesions, could be a cause of persistent inflammatory marker elevation and leukocytosis. If no other causes identified could discuss more aggressive management or derm input depending on symptom severity.  Orders: Orders Placed This Encounter  Procedures  . RNP Antibody  .   Anti-Smith antibody  . Sjogrens syndrome-A extractable nuclear antibody  . Sjogrens syndrome-B extractable nuclear antibody  . Anti-DNA antibody, double-stranded  . C3 and C4   No orders of the defined types were placed in this encounter.   Follow-Up Instructions: Return in about 2 weeks (around 09/14/2020) for New pt f/u pos ANA.   Christopher W Rice, MD  Note - This record has been created using Dragon software.  Chart creation errors have been sought, but may not always  have been located. Such creation errors do not reflect on  the standard of medical care.  

## 2020-08-31 NOTE — Patient Instructions (Signed)
Antinuclear Antibody Test Why am I having this test? This is a test that is used to help diagnose systemic lupus erythematosus (SLE) and other autoimmune diseases. An autoimmune disease is a disease in which the body's own defense (immune)system attacks its organs. What is being tested? This test checks for antinuclear antibodies (ANA) in the blood. The presence of ANA is associated with several autoimmune diseases. It is seen in almost all patients with lupus. What kind of sample is taken? A blood sample is required for this test. It is usually collected by inserting a needle into a blood vessel.   How are the results reported? Your test results will be reported as either positive or negative. A false-positive result can occur. A false positive is incorrect because it means that a condition is present when it is not. What do the results mean? A positive test result may mean that you have:  Lupus.  Other autoimmune diseases, such as rheumatoid arthritis, scleroderma, or Sjgren syndrome. Conditions that may cause a false-positive result include:  Liver dysfunction.  Myasthenia gravis.  Infectious mononucleosis. Talk with your health care provider about what your results mean. Questions to ask your health care provider Ask your health care provider, or the department that is doing the test:  When will my results be ready?  How will I get my results?  What are my treatment options?  What other tests do I need?  What are my next steps? Summary  This is a test that is used to help diagnose systemic lupus erythematosus (SLE) and other autoimmune diseases. An autoimmune disease is a disease in which the body's own defense (immune)system attacks the body.  This test checks for antinuclear antibodies (ANA) in the blood. The presence of ANA is associated with several autoimmune diseases. It is seen in almost all patients with lupus.  Your test results will be reported as either  positive or negative. Talk with your health care provider about what your results mean.   Anti-DNA Antibody Test Why am I having this test? The anti-DNA antibody test helps with the diagnosis and follow-up of systemic lupus erythematosus (SLE). It is also used to monitor treatment of this condition as the antibody decreases with successful therapy. What is being tested? This test measures the amount of anti-DNA antibody in the blood. This antibody is found in 65-80% of patients with active SLE. This antibody is not as common in patients who have other diseases. What kind of sample is taken? A blood sample is required for this test. It is usually collected by inserting a needle into a blood vessel.   How are the results reported? Your test results will be reported as a value. Your test results may also be reported as positive, intermediate, or negative. Your health care provider will compare your results to normal ranges that were established after testing a large group of people (reference values). Reference values may vary among labs and hospitals. For this test, common reference values are:  Positive: 10 or more international units/mL.  Intermediate: 5-9 international units/mL.  Negative: Less than 5 international units/mL. What do the results mean? Positive results, which are associated with results that are higher than the reference values, may indicate:  Autoimmune disorders such as SLE.  Infectious mononucleosis.  Chronic liver conditions. Intermediate results mean that the anti-DNA antibody levels are higher than normal, but not high enough to be considered positive. Negative results mean that you do not have the anti-DNA antibody that is  associated with these conditions. Talk with your health care provider about what your results mean. Questions to ask your health care provider Ask your health care provider, or the department that is doing the test:  When will my results be  ready?  How will I get my results?  What are my treatment options?  What other tests do I need?  What are my next steps? Summary  The anti-DNA antibody test helps with the diagnosis and follow-up of systemic lupus erythematosus (SLE). It is also used to monitor treatment of this condition as the antibody decreases with successful therapy.  This test measures the amount of anti-DNA antibody in the blood.  Elevated levels of anti-DNA antibody can be seen in patients with SLE and certain other conditions.   Complement Assay Test Why am I having this test? Complement refers to a group of proteins that are part of the body's disease-fighting system (immune system). A complement assay test provides information about some or all of these proteins. You may have this test:  To diagnose a lack, or deficiency, of certain complement proteins. Deficiencies can be passed from parent to child (inherited).  To monitor an infection or autoimmune disease.  If you have unexplained inflammation or swelling (edema).  If you have bacterial infections again and again. What is being tested? This test can be used to measure:  Total complement. This is the total number of protein complements in your blood.  The number of each kind of complement in your blood. The nine main kinds of complement are labeled C1 through C9. Some of these complements, such as C3 and C4, are especially important and have many functions in the body. Depending on why you are having the test, your health care provider may test your total complement or only some individual complements, such as C3 and C4. The total complement assay test may be done before individual complements are tested. What kind of sample is taken? A blood sample is required for this test. It is usually collected by inserting a needle into a blood vessel.   Tell a health care provider about:  Any allergies you have.  All medicines you are taking, including  vitamins, herbs, eye drops, creams, and over-the-counter medicines.  Any blood disorders you have.  Any surgeries you have had.  Any medical conditions you have.  Whether you are pregnant or may be pregnant. How are the results reported? Your results will be reported as a value that tells you how much complement is in your blood. This will be given as units per milliliter of blood (units/mL) or as milligrams per deciliter of blood (mg/dL). Your results may be reported as total complement, or as individual complements, or both. Your health care provider will compare your results to normal ranges that were established after testing a large group of people (reference ranges). Reference ranges may vary among labs and hospitals. For this test, reference ranges for some of the most commonly measured complement assays may be:  Total complement: 30-75 units/mL.  C2: 1-4 mg/dL.  C3: 75-175 mg/dL.  C4: 22-45 units/mL. What do the results mean? Results within reference ranges are considered normal, which means you have a normal amount of complement in your blood. Results that are higher than the reference ranges may be caused by:  Inflammatory disease.  Heart attack.  Cancer. Complement deficiencies, or results lower than the reference ranges, may be caused by:  Certain inherited conditions.  Autoimmune disease.  Certain liver diseases.  do I need? What are my next steps? Summary Complement refers to a group of proteins that are part of the body's disease-fighting system (immune system). A complement assay test can provide information about some or all of these proteins. You may have a complement assay  test to help diagnose a complement deficiency, and to monitor some infections or autoimmune disease. Talk with your health care provider about what your results mean.  

## 2020-09-01 LAB — ANTI-DNA ANTIBODY, DOUBLE-STRANDED: ds DNA Ab: <1 IU/mL

## 2020-09-01 LAB — RNP ANTIBODY: Ribonucleic Protein(ENA) Antibody, IgG: <1 AI

## 2020-09-01 LAB — SJOGRENS SYNDROME-A EXTRACTABLE NUCLEAR ANTIBODY: SSA (Ro) (ENA) Antibody, IgG: <1 AI

## 2020-09-01 LAB — SJOGRENS SYNDROME-B EXTRACTABLE NUCLEAR ANTIBODY: SSB (La) (ENA) Antibody, IgG: <1 AI

## 2020-09-01 LAB — ANTI-SMITH ANTIBODY: ENA SM Ab Ser-aCnc: <1 AI

## 2020-09-01 LAB — C3 AND C4
C3 Complement: 211 mg/dL — ABNORMAL HIGH (ref 83–193)
C4 Complement: 29 mg/dL (ref 15–57)

## 2020-09-03 NOTE — Progress Notes (Signed)
Lab results for more specific tests from the positive ANA are all negative so does not indicate lupus or other autoimmune disease cause for symptoms. We can follow up to see if symptoms are changing. If so she may benefit from additional workup such as nerve conduction studies.

## 2020-09-09 ENCOUNTER — Telehealth: Payer: Self-pay | Admitting: Internal Medicine

## 2020-09-09 ENCOUNTER — Other Ambulatory Visit: Payer: Self-pay | Admitting: Gastroenterology

## 2020-09-09 DIAGNOSIS — K219 Gastro-esophageal reflux disease without esophagitis: Secondary | ICD-10-CM

## 2020-09-09 DIAGNOSIS — R1010 Upper abdominal pain, unspecified: Secondary | ICD-10-CM

## 2020-09-09 NOTE — Telephone Encounter (Signed)
Pt called to make OV and is aware of date and time. She said that [facility] was suppose to send [place] a refill request on her acid reflux med and was following up on that.

## 2020-09-09 NOTE — Telephone Encounter (Signed)
Pt called requesting refill on acid reflux medicine.  Tried to call pt back to let her know we received electronic request.  Had to leave pt a message.  Routing to [facility] Refill as FYI.

## 2020-09-15 MED ORDER — ESOMEPRAZOLE MAGNESIUM 40 MG PO CPDR: DELAYED_RELEASE_CAPSULE | ORAL | 5 refills | Status: DC

## 2020-09-15 NOTE — Telephone Encounter (Signed)
Completed.

## 2020-09-15 NOTE — Addendum Note (Signed)
Addended by: Annitta Needs on: 09/15/2020 01:27 PM   Modules accepted: Orders

## 2020-09-21 NOTE — Progress Notes (Signed)
Office Visit Note  Patient: Dana Mcpherson             Date of Birth: 12/27/1990           MRN: [id number]             PCP: [facility] Referring: [facility] Visit Date: 09/22/2020   Subjective:  Follow-up (Patient denies changes in symptoms since last visit. )   History of Present Illness: Dana N Steines is a 30 y.o. female here for follow up for ongoing neck and bilateral upper back and hand pain with positive ANA antibodies and increased inflammatory markers.  Since the last visit additional antibody panel testing was negative she continues to have the same degree of symptoms.  Besides pain she does continue to notice intermittent numbness and sometimes dropping her difficulty gripping tightly with her hands.  She feels cystic and hidradenitis skin disease is currently equal or better than usual for her at present.  Urticarial rashes remain intermittent not acting up today.    Review of Systems  Constitutional: Negative for fatigue.  HENT: Negative for mouth sores, mouth dryness and nose dryness.   Eyes: Negative for pain, redness, itching and visual disturbance.  Respiratory: Positive for shortness of breath. Negative for cough, hemoptysis and difficulty breathing.   Cardiovascular: Positive for chest pain and palpitations. Negative for swelling in legs/feet.  Gastrointestinal: Negative for abdominal pain, blood in stool, constipation and diarrhea.  Endocrine: Negative for increased urination.  Genitourinary: Negative for painful urination.  Musculoskeletal: Positive for arthralgias, joint pain, joint swelling, myalgias, muscle weakness, muscle tenderness and myalgias. Negative for morning stiffness.  Skin: Positive for redness. Negative for color change and rash.  Allergic/Immunologic: Positive for susceptible to infections.  Neurological: Positive for headaches and parasthesias. Negative for dizziness, numbness, memory loss and weakness.   Hematological: Negative for swollen glands.  Psychiatric/Behavioral: Positive for sleep disturbance. Negative for confusion.    PMFS History:  Patient Active Problem List   Diagnosis Date Noted  . Positive ANA (antinuclear antibody) 08/31/2020  . Weakness of both hands 08/31/2020  . Upper back pain 08/31/2020  . Mild intermittent asthma, uncomplicated 79/39/0300  . Perennial allergic rhinitis 06/12/2020  . Anaphylactic shock due to adverse food reaction 06/12/2020  . Chronic urticaria 06/12/2020  . Early satiety 03/11/2020  . Abdominal pain 05/24/2019  . Abdominal bloating 02/20/2019  . Elevated LFTs 02/20/2019  . Gastroesophageal reflux disease 02/20/2019  . Dysphagia 02/20/2019  . Diarrhea 02/20/2019  . Atypical pneumonia 02/19/2016  . Pulmonary infiltrates 02/18/2016  . Hemorrhoid 06/29/2015  . Hidradenitis suppurativa 06/23/2015  . Shortness of breath 11/04/2014  . Tachycardia 10/07/2014  . ASCUS with positive high risk HPV 05/28/2014  . PCOS (polycystic ovarian syndrome) 03/03/2014  . Hyperinsulinemia 03/03/2014  . Morbid obesity ([place]) 05/23/2013  . DUB (dysfunctional uterine bleeding) 05/23/2013    Past Medical History:  Diagnosis Date  . Allergy   . Anxiety   . Asthma   . Autoimmune disorder ([place])   . Complication of anesthesia    woke up during surgery & ithing after surgery  . Family history of adverse reaction to anesthesia    "My mom has the same trouble with anesthesia".  . GERD (gastroesophageal reflux disease)   . Migraine headache   . Nexplanon in place 10/05/2015   06/29/15 left arm  . Polycystic ovarian syndrome   . Pregnancy induced hypertension   . Tachycardia   . Vaginal Pap smear, abnormal  Family History  Problem Relation Age of Onset  . Cancer Paternal Grandmother        ovarian cancer  . Other Mother        Ovarian Tumor  . Hypertension Father   . Asthma Son   . ADD / ADHD Son   . Asthma Son   . Allergies Son   . Colon cancer  Other        late 70's  . Diabetes Sister   . Healthy Brother    Past Surgical History:  Procedure Laterality Date  . BIOPSY  06/19/2019   Procedure: BIOPSY;  Surgeon: Daneil Dolin, MD;  Location: [facility];  Service: Endoscopy;;  . CHOLECYSTECTOMY  2010  . COLONOSCOPY WITH PROPOFOL N/A 06/19/2019   Procedure: COLONOSCOPY WITH PROPOFOL;  Surgeon: Daneil Dolin, MD; 1 tubular adenoma, otherwise normal-appearing colonic mucosa, normal TI.  Stool sample for C. difficile negative.  Recommended repeat colonoscopy in 7 years.  . ESOPHAGOGASTRODUODENOSCOPY    . ESOPHAGOGASTRODUODENOSCOPY (EGD) WITH PROPOFOL N/A 04/08/2019   Procedure: ESOPHAGOGASTRODUODENOSCOPY (EGD) WITH PROPOFOL;  Surgeon: Daneil Dolin, MD; erosive reflux esophagitis s/p dilation, gastric mucosa congested diffusely with snakeskin appearance, antral erosions, congested eroded duodenal bulb, otherwise normal.  . MALONEY DILATION N/A 04/08/2019   Procedure: Venia Minks DILATION;  Surgeon: Daneil Dolin, MD;  Location: [facility];  Service: Endoscopy;  Laterality: N/A;  . POLYPECTOMY  06/19/2019   Procedure: POLYPECTOMY;  Surgeon: Daneil Dolin, MD;  Location: [facility];  Service: Endoscopy;;  cecal polyp   Social History   Social History Narrative  . Not on file   Immunization History  Administered Date(s) Administered  . HPV Quadrivalent 06/22/2011  . Influenza-Unspecified 03/01/2006  . Td 07/25/2002     Objective: Vital Signs: BP 112/75 (BP Location: Left Arm, Patient Position: Sitting, Cuff Size: Normal)   Pulse 79   Ht 5' 6.5" (1.689 m)   Wt 244 lb 3.2 oz (110.8 kg)   BMI 38.82 kg/m    Physical Exam Constitutional:      Appearance: She is obese.  Eyes:     Conjunctiva/sclera: Conjunctivae normal.  Skin:    General: Skin is warm and dry.     Comments: Central facial erythematous rash with multiple telangiectasias no papules or lesions  Neurological:     Mental Status: She is alert.      Musculoskeletal Exam:  Neck full ROM no tenderness Shoulders full ROM, B/l positive neers and hawkins Elbows full ROM no tenderness or swelling Wrists full ROM no tenderness or swelling, b/l negative tinels and phalens Fingers full ROM no tenderness or swelling Knees full ROM no tenderness or swelling Ankles full ROM no tenderness or swelling   Investigation: No additional findings.  Imaging: No results found.  Recent Labs: Lab Results  Component Value Date   WBC 16.0 (H) 08/11/2020   HGB 14.3 08/11/2020   PLT 349 08/11/2020   NA 138 08/11/2020   K 4.2 08/11/2020   CL 106 08/11/2020   CO2 24 08/11/2020   GLUCOSE 84 08/11/2020   BUN 10 08/11/2020   CREATININE 0.81 08/11/2020   BILITOT 0.4 07/09/2020   ALKPHOS 75 07/09/2020   AST 41 (H) 07/09/2020   ALT 43 (H) 07/09/2020   PROT 7.3 07/09/2020   ALBUMIN 4.8 07/09/2020   CALCIUM 9.0 08/11/2020   GFRAA >60 05/19/2019    Speciality Comments: No specialty comments available.  Procedures:  No procedures performed Allergies: Other, Shellfish allergy,  Adhesive [tape], Eggs or egg-derived products, and Latex   Assessment / Plan:     Visit Diagnoses: Positive ANA (antinuclear antibody)  Evaluation for positive ANA antibodies has not indicated any particular other evidence for systemic autoimmune disease.  Positive ANA can be associated with chronic idiopathic urticaria and a minority of cases.  Do not recommend immunosuppressive treatment or additional rheumatology specific testing at this time.  Chronic urticaria  Ongoing, intermittent not in exacerbation today can be associated with some peripheral swelling and arthralgias.  Hidradenitis suppurativa  Not currently in exacerbation, can be a cause of moderately persistent inflammatory markers although seems unusual if not very symptomatic right now.  Weakness of both hands - Plan: Ambulatory referral to Physical Medicine Rehab  Most suggestive symptoms are numbness  tingling difficulty using hands there is some shoulder impingement syndrome question she could have a compressive myelopathy contributing I doubt this is a peripheral sensory neuropathy based on no lower extremity involvement.  Will refer to Dr. Ernestina Patches for nerve conduction study.  Orders: Orders Placed This Encounter  Procedures  . Ambulatory referral to Physical Medicine Rehab   No orders of the defined types were placed in this encounter.    Follow-Up Instructions: No follow-ups on file.   Collier Salina, MD  Note - This record has been created using [employer].  Chart creation errors have been sought, but may not always  have been located. Such creation errors do not reflect on  the standard of medical care.

## 2020-09-22 ENCOUNTER — Other Ambulatory Visit: Payer: Self-pay

## 2020-09-22 ENCOUNTER — Encounter: Payer: Self-pay | Admitting: Internal Medicine

## 2020-09-22 ENCOUNTER — Ambulatory Visit (INDEPENDENT_AMBULATORY_CARE_PROVIDER_SITE_OTHER): Payer: 59 | Admitting: Internal Medicine

## 2020-09-22 VITALS — BP 112/75 | HR 79 | Ht 66.5 in | Wt 244.2 lb

## 2020-09-22 DIAGNOSIS — L732 Hidradenitis suppurativa: Secondary | ICD-10-CM

## 2020-09-22 DIAGNOSIS — L508 Other urticaria: Secondary | ICD-10-CM

## 2020-09-22 DIAGNOSIS — R768 Other specified abnormal immunological findings in serum: Secondary | ICD-10-CM

## 2020-09-22 DIAGNOSIS — R29898 Other symptoms and signs involving the musculoskeletal system: Secondary | ICD-10-CM | POA: Diagnosis not present

## 2020-10-16 ENCOUNTER — Ambulatory Visit (INDEPENDENT_AMBULATORY_CARE_PROVIDER_SITE_OTHER): Payer: 59 | Admitting: Allergy & Immunology

## 2020-10-16 ENCOUNTER — Encounter: Payer: Self-pay | Admitting: Allergy & Immunology

## 2020-10-16 ENCOUNTER — Other Ambulatory Visit: Payer: Self-pay

## 2020-10-16 VITALS — BP 126/84 | HR 72 | Temp 98.0°F | Resp 16 | Ht 66.0 in | Wt 239.6 lb

## 2020-10-16 DIAGNOSIS — T7800XD Anaphylactic reaction due to unspecified food, subsequent encounter: Secondary | ICD-10-CM

## 2020-10-16 DIAGNOSIS — J3089 Other allergic rhinitis: Secondary | ICD-10-CM | POA: Diagnosis not present

## 2020-10-16 DIAGNOSIS — L501 Idiopathic urticaria: Secondary | ICD-10-CM | POA: Diagnosis not present

## 2020-10-16 DIAGNOSIS — J4520 Mild intermittent asthma, uncomplicated: Secondary | ICD-10-CM

## 2020-10-16 MED ORDER — OMALIZUMAB 150 MG/ML ~~LOC~~ SOSY
150.0000 mg | PREFILLED_SYRINGE | Freq: Once | SUBCUTANEOUS | Status: AC
  Administered 2020-10-16: 150 mg via SUBCUTANEOUS

## 2020-10-16 MED ORDER — ALBUTEROL SULFATE HFA 108 (90 BASE) MCG/ACT IN AERS: 1.0000 | INHALATION_SPRAY | Freq: Four times a day (QID) | RESPIRATORY_TRACT | 2 refills | Status: DC | PRN

## 2020-10-16 MED ORDER — CETIRIZINE HCL 10 MG PO TABS: ORAL_TABLET | ORAL | 5 refills | Status: DC

## 2020-10-16 MED ORDER — EPINEPHRINE 0.3 MG/0.3ML IJ SOAJ: 0.3000 mg | Freq: Once | INTRAMUSCULAR | 1 refills | Status: DC | PRN

## 2020-10-16 NOTE — Progress Notes (Signed)
FOLLOW UP  Date of Service/Encounter:  10/16/20   Assessment:   Mild intermittent asthma, uncomplicated   Chronic rhinitis   Anaphylactic shock due to food   Chronic urticaria - interested in initiating Xolair   Joint pains in hands/wrist - in the setting of elevated inflammatory markers ([facility] referral placed)    Tachycardia - exacerbated by recent steroid course for sinusitis   Full vaccinated with Moderna (NEEDS booster)  Plan/Recommendations:    1. Mild intermittent asthma, uncomplicated - Lung testing  looked decent today. - The addition of the Xolair might help with the asthma as well.  - I do not think that we need to add any controller medications at this time.  - Continue with albuterol 4 puffs every every 4-6 hours as needed.   2. Chronic rhinitis (dust mites, cockroach) - Continue with your antihistamines.  3. 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 - EpiPen is up to date.   4. Chronic urticaria with elevated ANA and elevated inflammatory markers - Continue with suppressive dosing of antihistamines: Zyrtec two tablets up to twice daily - Xolair sample given today.  - Tammy will reach out to you to discuss the process.   5. Return in about 6 weeks (around 11/27/2020).    Subjective:   Dana Mcpherson is a 30 y.o. female presenting today for follow up of  Chief Complaint  Patient presents with   Asthma    Asthma has been good only needs her inhaler when she is sick. Has not used it since February-March when she had pneumonia    Allergic Rhinitis     Had a sinus infection with an ear infection. The medication caused issues with her heart condition and ended up in the hospital due to take too much steroid medication.    Urticaria    Breaks out randomly would like to talk about doing injection. On her face and chin is where she breaks out.     Dana Mcpherson has a history of  the following: Patient Active Problem List   Diagnosis Date Noted   Positive ANA (antinuclear antibody) 08/31/2020   Weakness of both hands 08/31/2020   Upper back pain 08/31/2020   Mild intermittent asthma, uncomplicated 19/37/9024   Perennial allergic rhinitis 06/12/2020   Anaphylactic shock due to adverse food reaction 06/12/2020   Chronic urticaria 06/12/2020   Early satiety 03/11/2020   Abdominal pain 05/24/2019   Abdominal bloating 02/20/2019   Elevated LFTs 02/20/2019   Gastroesophageal reflux disease 02/20/2019   Dysphagia 02/20/2019   Diarrhea 02/20/2019   Atypical pneumonia 02/19/2016   Pulmonary infiltrates 02/18/2016   Hemorrhoid 06/29/2015   Hidradenitis suppurativa 06/23/2015   Shortness of breath 11/04/2014   Tachycardia 10/07/2014   ASCUS with positive high risk HPV 05/28/2014   PCOS (polycystic ovarian syndrome) 03/03/2014   Hyperinsulinemia 03/03/2014   Morbid obesity ([place]) 05/23/2013   DUB (dysfunctional uterine bleeding) 05/23/2013    History obtained from: chart review and patient.  Dana is a 30 y.o. female presenting for a follow up visit.  She was last seen in March 2022.  At that time, we did not do lung testing.  We continue with albuterol as needed.  She recently been diagnosed with COVID-pneumonia and had recovered from that.  For her rhinitis, we continued with antihistamines.  We recommended avoidance of seafood and egg.  For her history of chronic urticaria, we continue with  Zyrtec twice daily.  We gave her information on Xolair.  Since last visit, she has continued to have breakouts. This is despite cetirizine 20mg  BID. She is ready to go ahead with the [place].  She thinks she is ready to start Xolair today.  She is very motivated to get a sample.  She is getting some sleepiness from the cetirizine.  Asthma/Respiratory Symptom History: She was doing well and she normally does fine as long as she is not sick.  She has had no coughing or wheezing.   She has not needed steroids for her asthma.  She has not been in the hospital nor has she been to the emergency room.  Allergic Rhinitis Symptom History: She did get sick recently from a sinus infection and allergies. She got steroids and this caused a lot of tachycardia. She almost passed out at work and she had a very high heart rate. She missed two weeks of work to get this out of her body.  She gets sinus infections every time the weather changes.   Food Allergy Symptom History: She continues to avoid seafood and egg. She did have hives with egg exposure.   Otherwise, there have been no changes to her past medical history, surgical history, family history, or social history.    Review of Systems  Constitutional: Negative.  Negative for chills, fever, malaise/fatigue and weight loss.  HENT:  Negative for congestion, ear discharge, ear pain and sinus pain.   Eyes:  Negative for pain, discharge and redness.  Respiratory:  Negative for cough, sputum production, shortness of breath and wheezing.   Cardiovascular: Negative.  Negative for chest pain and palpitations.  Gastrointestinal:  Negative for abdominal pain, constipation, diarrhea, heartburn, nausea and vomiting.  Skin: Negative.  Negative for itching and rash.  Neurological:  Negative for dizziness and headaches.  Endo/Heme/Allergies:  Negative for environmental allergies. Does not bruise/bleed easily.      Objective:   Blood pressure 126/84, pulse 72, temperature 98 F (36.7 C), resp. rate 16, height 5\' 6"  (1.676 m), weight 239 lb 9.6 oz (108.7 kg), SpO2 97 %. Body mass index is 38.67 kg/m.   Physical Exam:  Physical Exam Constitutional:      Appearance: She is well-developed.     Comments: Talkative.  HENT:     Head: Normocephalic and atraumatic.     Right Ear: Tympanic membrane, ear canal and external ear normal.     Left Ear: Tympanic membrane, ear canal and external ear normal.     Nose: Mucosal edema and rhinorrhea  present. No nasal deformity or septal deviation.     Right Turbinates: Enlarged and swollen.     Left Turbinates: Enlarged and swollen.     Right Sinus: No maxillary sinus tenderness or frontal sinus tenderness.     Left Sinus: No maxillary sinus tenderness or frontal sinus tenderness.     Mouth/Throat:     Mouth: Mucous membranes are not pale and not dry.     Pharynx: Uvula midline.  Eyes:     General: Lids are normal. No allergic shiner.       Right eye: No discharge.        Left eye: No discharge.     Conjunctiva/sclera: Conjunctivae normal.     Right eye: Right conjunctiva is not injected. No chemosis.    Left eye: Left conjunctiva is not injected. No chemosis.    Pupils: Pupils are equal, round, and reactive to light.  Cardiovascular:  Rate and Rhythm: Normal rate and regular rhythm.     Heart sounds: Normal heart sounds.  Pulmonary:     Effort: Pulmonary effort is normal. No tachypnea, accessory muscle usage or respiratory distress.     Breath sounds: Normal breath sounds. No wheezing, rhonchi or rales.  Chest:     Chest wall: No tenderness.  Lymphadenopathy:     Cervical: No cervical adenopathy.  Skin:    General: Skin is warm.     Capillary Refill: Capillary refill takes less than 2 seconds.     Coloration: Skin is not pale.     Findings: No abrasion, erythema, petechiae or rash. Rash is not papular, urticarial or vesicular.     Comments: Excoriations over her bilateral arms.  Neurological:     Mental Status: She is alert.  Psychiatric:        Behavior: Behavior is cooperative.     Diagnostic studies:    Spirometry: results abnormal (FEV1: 2.38/70%, FVC: 2.91/72%, FEV1/FVC: 82%).    Spirometry consistent with possible restrictive disease.   Allergy Studies: none        Salvatore Marvel, MD  [facility]

## 2020-10-16 NOTE — Patient Instructions (Addendum)
1. Mild intermittent asthma, uncomplicated - Lung testing  looked decent today. - The addition of the Xolair might help with the asthma as well.  - I do not think that we need to add any controller medications at this time.  - Continue with albuterol 4 puffs every every 4-6 hours as needed.   2. Chronic rhinitis (dust mites, cockroach) - Continue with your antihistamines.  3. 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 - EpiPen is up to date.   4. Chronic urticaria with elevated ANA and elevated inflammatory markers - Continue with suppressive dosing of antihistamines: Zyrtec two tablets up to twice daily - Xolair sample given today.  - Dana Mcpherson will reach out to you to discuss the process.   5. Return in about 6 weeks (around 11/27/2020).    Please inform us of any Emergency Department visits, hospitalizations, or changes in symptoms. Call us before going to the ED for breathing or allergy symptoms since we might be able to fit you in for a sick visit. Feel free to contact us anytime with any questions, problems, or concerns.  It was a pleasure to see you again today!  Websites that have reliable patient information: 1. American Academy of Asthma, Allergy, and Immunology: www.aaaai.org 2. Food Allergy Research and Education (FARE): foodallergy.org 3. Mothers of Asthmatics: http://www.asthmacommunitynetwork.org 4. American College of Allergy, Asthma, and Immunology: www.acaai.org   COVID-19 Vaccine Information can be found at: ShippingScam.co.uk For questions related to vaccine distribution or appointments, please email vaccine@ [email or web address] or call [phone].   We realize that you might be concerned about having an allergic reaction to the COVID19 vaccines. To help with that concern, WE ARE OFFERING THE COVID19 VACCINES IN OUR OFFICE! Ask the front desk  for dates!     "Like" [place] on Facebook and Instagram for our latest updates!      A healthy democracy works best when [employer] participate! Make sure you are registered to vote! If you have moved or changed any of your contact information, you will need to get this updated before voting!  In some cases, you MAY be able to register to vote online: [email or web address]

## 2020-10-16 NOTE — Progress Notes (Signed)
Immunotherapy   Patient Details  Name: Dana Mcpherson MRN: [id number] Date of Birth: Aug 25, 1990  10/16/2020  Dana N Gaster started injections for  Urticaria. Patent received a 150 mg sample in office today however patient will start 300 mg at next injection. Patient waited 30 minutes with no problems.  Frequency: Every 28 days Epi-Pen:Epi-Pen Available  Consent signed and patient instructions given.   Herbie Drape 10/16/2020, 2:35 PM

## 2020-10-20 ENCOUNTER — Encounter: Payer: Self-pay | Admitting: Allergy & Immunology

## 2020-10-23 NOTE — Patient Instructions (Addendum)
Your procedure is scheduled on: 10/28/2020  Report to [facility] at    6:15 AM.  Call this number if you have problems the morning of surgery: [phone]   Remember:   Do not Eat or Drink after midnight         No Smoking the morning of surgery  :  Take these medicines the morning of surgery with A SIP OF WATER: Nexium, Zyrtec, topamax, and Ivabradine   Do not wear jewelry, make-up or nail polish.  Do not wear lotions, powders, or perfumes. You may wear deodorant.  Do not shave 48 hours prior to surgery. Men may shave face and neck.  Do not bring valuables to the hospital.  Contacts, dentures or bridgework may not be worn into surgery.  Leave suitcase in the car. After surgery it may be brought to your room.  For patients admitted to the hospital, checkout time is 11:00 AM the day of discharge.   Patients discharged the day of surgery will not be allowed to drive home.    Special Instructions: Shower using CHG night before surgery and shower the day of surgery use CHG.  Use special wash - you have one bottle of CHG for all showers.  You should use approximately 1/2 of the bottle for each shower.  How to Use Chlorhexidine for Bathing Chlorhexidine gluconate (CHG) is a germ-killing (antiseptic) solution that is used to clean the skin. It can get rid of the bacteria that normally live on the skin and can keep them away for about 24 hours. To clean your skin with CHG, you may be given: A CHG solution to use in the shower or as part of a sponge bath. A prepackaged cloth that contains CHG. Cleaning your skin with CHG may help lower the risk for infection: While you are staying in the intensive care unit of the hospital. If you have a vascular access, such as a central line, to provide short-term or long-term access to your veins. If you have a catheter to drain urine from your bladder. If you are on a ventilator. A ventilator is a machine that helps you breathe by moving air  in and out of your lungs. After surgery. What are the risks? Risks of using CHG include: A skin reaction. Hearing loss, if CHG gets in your ears. Eye injury, if CHG gets in your eyes and is not rinsed out. The CHG product catching fire. Make sure that you avoid smoking and flames after applying CHG to your skin. Do not use CHG: If you have a chlorhexidine allergy or have previously reacted to chlorhexidine. On babies younger than 52 months of age. How to use CHG solution Use CHG only as told by your health care provider, and follow the instructions on the label. Use the full amount of CHG as directed. Usually, this is one bottle. During a shower Follow these steps when using CHG solution during a shower (unless your health care provider gives you different instructions): Start the shower. Use your normal soap and shampoo to wash your face and hair. Turn off the shower or move out of the shower stream. Pour the CHG onto a clean washcloth. Do not use any type of brush or rough-edged sponge. Starting at your neck, lather your body down to your toes. Make sure you follow these instructions: If you will be having surgery, pay special attention to the part of your body where you will be having surgery. Scrub this area  for at least 1 minute. Do not use CHG on your head or face. If the solution gets into your ears or eyes, rinse them well with water. Avoid your genital area. Avoid any areas of skin that have broken skin, cuts, or scrapes. Scrub your back and under your arms. Make sure to wash skin folds. Let the lather sit on your skin for 1-2 minutes or as long as told by your health care provider. Thoroughly rinse your entire body in the shower. Make sure that all body creases and crevices are rinsed well. Dry off with a clean towel. Do not put any substances on your body afterward--such as powder, lotion, or perfume--unless you are told to do so by your health care provider. Only use lotions  that are recommended by the manufacturer. Put on clean clothes or pajamas. If it is the night before your surgery, sleep in clean sheets.  During a sponge bath Follow these steps when using CHG solution during a sponge bath (unless your health care provider gives you different instructions): Use your normal soap and shampoo to wash your face and hair. Pour the CHG onto a clean washcloth. Starting at your neck, lather your body down to your toes. Make sure you follow these instructions: If you will be having surgery, pay special attention to the part of your body where you will be having surgery. Scrub this area for at least 1 minute. Do not use CHG on your head or face. If the solution gets into your ears or eyes, rinse them well with water. Avoid your genital area. Avoid any areas of skin that have broken skin, cuts, or scrapes. Scrub your back and under your arms. Make sure to wash skin folds. Let the lather sit on your skin for 1-2 minutes or as long as told by your health care provider. Using a different clean, wet washcloth, thoroughly rinse your entire body. Make sure that all body creases and crevices are rinsed well. Dry off with a clean towel. Do not put any substances on your body afterward--such as powder, lotion, or perfume--unless you are told to do so by your health care provider. Only use lotions that are recommended by the manufacturer. Put on clean clothes or pajamas. If it is the night before your surgery, sleep in clean sheets. How to use CHG prepackaged cloths Only use CHG cloths as told by your health care provider, and follow the instructions on the label. Use the CHG cloth on clean, dry skin. Do not use the CHG cloth on your head or face unless your health care provider tells you to. When washing with the CHG cloth: Avoid your genital area. Avoid any areas of skin that have broken skin, cuts, or scrapes. Before surgery Follow these steps when using a CHG cloth to  clean before surgery (unless your health care provider gives you different instructions): Using the CHG cloth, vigorously scrub the part of your body where you will be having surgery. Scrub using a back-and-forth motion for 3 minutes. The area on your body should be completely wet with CHG when you are done scrubbing. Do not rinse. Discard the cloth and let the area air-dry. Do not put any substances on the area afterward, such as powder, lotion, or perfume. Put on clean clothes or pajamas. If it is the night before your surgery, sleep in clean sheets.  For general bathing Follow these steps when using CHG cloths for general bathing (unless your health care provider gives you  different instructions). Use a separate CHG cloth for each area of your body. Make sure you wash between any folds of skin and between your fingers and toes. Wash your body in the following order, switching to a new cloth after each step: The front of your neck, shoulders, and chest. Both of your arms, under your arms, and your hands. Your stomach and groin area, avoiding the genitals. Your right leg and foot. Your left leg and foot. The back of your neck, your back, and your buttocks. Do not rinse. Discard the cloth and let the area air-dry. Do not put any substances on your body afterward--such as powder, lotion, or perfume--unless you are told to do so by your health care provider. Only use lotions that are recommended by the manufacturer. Put on clean clothes or pajamas. Contact a health care provider if: Your skin gets irritated after scrubbing. You have questions about using your solution or cloth. Get help right away if: Your eyes become very red or swollen. Your eyes itch badly. Your skin itches badly and is red or swollen. Your hearing changes. You have trouble seeing. You have swelling or tingling in your mouth or throat. You have trouble breathing. You swallow any chlorhexidine. Summary Chlorhexidine  gluconate (CHG) is a germ-killing (antiseptic) solution that is used to clean the skin. Cleaning your skin with CHG may help to lower your risk for infection. You may be given CHG to use for bathing. It may be in a bottle or in a prepackaged cloth to use on your skin. Carefully follow your health care provider's instructions and the instructions on the product label. Do not use CHG if you have a chlorhexidine allergy. Contact your health care provider if your skin gets irritated after scrubbing. This information is not intended to replace advice given to you by your health care provider. Make sure you discuss any questions you have with your healthcare provider. Document Revised: 08/23/2019 Document Reviewed: 09/27/2019 Elsevier Patient Education  2022 [place] After The following information offers guidance on how to care for yourself after your procedure. Your health care provider may also give you more specific instructions. If you have problems or questions, contact your health careprovider. What can I expect after the procedure? After the procedure, it is common to have: Pain in your abdomen. Light vaginal bleeding (spotting) for a few days. Tiredness. Your recovery time will depend on which method was used for your surgery. Follow these instructions at home: Medicines Take over-the-counter and prescription medicines only as told by your health care provider. Ask your health care provider if the medicine prescribed to you: Requires you to avoid driving or using machinery. Can cause constipation. You may need to take actions to prevent or treat constipation, such as: Drink enough fluid to keep your urine pale yellow. Take over-the-counter or prescription medicines. Eat foods that are high in fiber, such as beans, whole grains, and fresh fruits and vegetables. Limit foods that are high in fat and processed sugars, such as fried or sweet foods. Incision  care  Follow instructions from your health care provider about how to take care of your incision or incisions. Make sure you: Wash your hands with soap and water for at least 20 seconds before and after you change your bandage (dressing). If soap and water are not available, use hand sanitizer. Change or remove your dressing as told by your health care provider. Leave stitches (sutures), skin glue, staples, or adhesive strips in place.  These skin closures may need to stay in place for 2 weeks or longer. If adhesive strip edges start to loosen and curl up, you may trim the loose edges. Do not remove adhesive strips completely unless your health care provider tells you to do that. Keep your dressing clean and dry. Check your incision area every day for signs of infection. Check for: Redness, swelling, or pain that gets worse. Fluid or blood. Warmth. Pus or a bad smell.  Activity Rest as told by your health care provider. Avoid sitting for a long time without moving. Get up to take short walks every 1-2 hours. This is important to improve blood flow and breathing. Ask for help if you feel weak or unsteady. Return to your normal activities as told by your health care provider. Ask your health care provider what activities are safe for you. Do not drive until your health care provider says that it is safe. Do not lift anything that is heavier than 10 lb (4.5 kg), or the limit that you are told, until your health care provider says that it is safe. This may last for 2-6 weeks depending on your surgery. Do not douche, use tampons, or have sex until your health care provider approves. General instructions Do not use any products that contain nicotine or tobacco. These products include cigarettes, chewing tobacco, and vaping devices, such as e-cigarettes. These can delay healing after surgery. If you need help quitting, ask your health care provider. Wear compression stockings as told by your health  care provider. These stockings help to prevent blood clots and reduce swelling in your legs. Do not take baths, swim, or use a hot tub until your health care provider approves. You may take showers. Keep all follow-up visits. This is important. Contact a health care provider if: You have pain when you urinate. You have redness, swelling, or more pain around an incision or an incision feels warm to the touch. You have pus, fluid, blood, or a bad smell coming from an incision or an incision starts to open. You have a fever. You have abdominal pain that gets worse or does not get better with medicine. You have a rash. You feel light-headed, have nausea and vomiting, or both. Get help right away if: You have pain in your chest or leg. You develop shortness of breath. You faint. You have increased or heavy vaginal bleeding, such as soaking a sanitary napkin in an hour. These symptoms may represent a serious problem that is an emergency. Do not wait to see if the symptoms will go away. Get medical help right away. Call your local emergency services (911 in the [place] Do not drive yourself to the hospital. Summary After the procedure, it is common to feel tired, have pain in your abdomen, and have light vaginal bleeding for a few days. Follow instructions from your health care provider about how to take care of your incision or incisions. Return to your normal activities as told by your health care provider. Ask your health care provider what activities are safe for you. Do not douche, use tampons, or have sex until your health care provider approves. Keep all follow-up visits. This is important. This information is not intended to replace advice given to you by your health care provider. Make sure you discuss any questions you have with your healthcare provider. Document Revised: 03/03/2020 Document Reviewed: 03/03/2020 Elsevier Patient Education  2022 [place] Anesthesia, Adult,  Care After This sheet gives you  information about how to care for yourself after your procedure. Your health care provider may also give you more specific instructions. If you have problems or questions, contact your health careprovider. What can I expect after the procedure? After the procedure, the following side effects are common: Pain or discomfort at the IV site. Nausea. Vomiting. Sore throat. Trouble concentrating. Feeling cold or chills. Feeling weak or tired. Sleepiness and fatigue. Soreness and body aches. These side effects can affect parts of the body that were not involved in surgery. Follow these instructions at home: For the time period you were told by your health care provider:  Rest. Do not participate in activities where you could fall or become injured. Do not drive or use machinery. Do not drink alcohol. Do not take sleeping pills or medicines that cause drowsiness. Do not make important decisions or sign legal documents. Do not take care of children on your own.  Eating and drinking Follow any instructions from your health care provider about eating or drinking restrictions. When you feel hungry, start by eating small amounts of foods that are soft and easy to digest (bland), such as toast. Gradually return to your regular diet. Drink enough fluid to keep your urine pale yellow. If you vomit, rehydrate by drinking water, juice, or clear broth. General instructions If you have sleep apnea, surgery and certain medicines can increase your risk for breathing problems. Follow instructions from your health care provider about wearing your sleep device: Anytime you are sleeping, including during daytime naps. While taking prescription pain medicines, sleeping medicines, or medicines that make you drowsy. Have a responsible adult stay with you for the time you are told. It is important to have someone help care for you until you are awake and alert. Return to your  normal activities as told by your health care provider. Ask your health care provider what activities are safe for you. Take over-the-counter and prescription medicines only as told by your health care provider. If you smoke, do not smoke without supervision. Keep all follow-up visits as told by your health care provider. This is important. Contact a health care provider if: You have nausea or vomiting that does not get better with medicine. You cannot eat or drink without vomiting. You have pain that does not get better with medicine. You are unable to pass urine. You develop a skin rash. You have a fever. You have redness around your IV site that gets worse. Get help right away if: You have difficulty breathing. You have chest pain. You have blood in your urine or stool, or you vomit blood. Summary After the procedure, it is common to have a sore throat or nausea. It is also common to feel tired. Have a responsible adult stay with you for the time you are told. It is important to have someone help care for you until you are awake and alert. When you feel hungry, start by eating small amounts of foods that are soft and easy to digest (bland), such as toast. Gradually return to your regular diet. Drink enough fluid to keep your urine pale yellow. Return to your normal activities as told by your health care provider. Ask your health care provider what activities are safe for you. This information is not intended to replace advice given to you by your health care provider. Make sure you discuss any questions you have with your healthcare provider. Document Revised: 12/26/2019 Document Reviewed: 07/25/2019 Elsevier Patient Education  2022 [employer].

## 2020-10-26 ENCOUNTER — Other Ambulatory Visit: Payer: Self-pay | Admitting: Obstetrics & Gynecology

## 2020-10-27 ENCOUNTER — Telehealth: Payer: Self-pay | Admitting: *Deleted

## 2020-10-27 ENCOUNTER — Encounter (HOSPITAL_COMMUNITY)
Admission: RE | Admit: 2020-10-27 | Discharge: 2020-10-27 | Disposition: A | Discharge status: Home / Self Care | Payer: 59 | Source: Ambulatory Visit | Attending: Obstetrics & Gynecology | Admitting: Obstetrics & Gynecology

## 2020-10-27 ENCOUNTER — Other Ambulatory Visit: Payer: Self-pay

## 2020-10-27 ENCOUNTER — Other Ambulatory Visit (HOSPITAL_COMMUNITY): Payer: 59 | Attending: Obstetrics & Gynecology

## 2020-10-27 DIAGNOSIS — Z01812 Encounter for preprocedural laboratory examination: Secondary | ICD-10-CM | POA: Insufficient documentation

## 2020-10-27 LAB — URINALYSIS, ROUTINE W REFLEX MICROSCOPIC
Bilirubin Urine: NEGATIVE
Glucose, UA: NEGATIVE mg/dL
Ketones, ur: NEGATIVE mg/dL
Nitrite: NEGATIVE
Protein, ur: NEGATIVE mg/dL
Specific Gravity, Urine: 1.019 (ref 1.005–1.030)
pH: 5 (ref 5.0–8.0)

## 2020-10-27 LAB — COMPREHENSIVE METABOLIC PANEL
ALT: 42 U/L (ref 0–44)
AST: 29 U/L (ref 15–41)
Albumin: 3.6 g/dL (ref 3.5–5.0)
Alkaline Phosphatase: 62 U/L (ref 38–126)
Anion gap: 9 (ref 5–15)
BUN: 7 mg/dL (ref 6–20)
CO2: 19 mmol/L — ABNORMAL LOW (ref 22–32)
Calcium: 8.6 mg/dL — ABNORMAL LOW (ref 8.9–10.3)
Chloride: 110 mmol/L (ref 98–111)
Creatinine, Ser: 0.72 mg/dL (ref 0.44–1.00)
GFR, Estimated: >60 mL/min (ref >60)
Glucose, Bld: 98 mg/dL (ref 70–99)
Potassium: 3.8 mmol/L (ref 3.5–5.1)
Sodium: 138 mmol/L (ref 135–145)
Total Bilirubin: 0.4 mg/dL (ref 0.3–1.2)
Total Protein: 7.6 g/dL (ref 6.5–8.1)

## 2020-10-27 LAB — RAPID HIV SCREEN (HIV 1/2 AB+AG)
HIV 1/2 Antibodies: NONREACTIVE
HIV-1 P24 Antigen - HIV24: NONREACTIVE

## 2020-10-27 LAB — CBC
HCT: 42.6 % (ref 36.0–46.0)
Hemoglobin: 13.9 g/dL (ref 12.0–15.0)
MCH: 28.7 pg (ref 26.0–34.0)
MCHC: 32.6 g/dL (ref 30.0–36.0)
MCV: 88 fL (ref 80.0–100.0)
Platelets: 321 10*3/uL (ref 150–400)
RBC: 4.84 MIL/uL (ref 3.87–5.11)
RDW: 13.3 % (ref 11.5–15.5)
WBC: 10.6 10*3/uL — ABNORMAL HIGH (ref 4.0–10.5)
nRBC: 0 % (ref 0.0–0.2)

## 2020-10-27 LAB — HCG, QUANTITATIVE, PREGNANCY: hCG, Beta Chain, Quant, S: <1 m[IU]/mL (ref <5)

## 2020-10-27 NOTE — H&P (Signed)
Preoperative History and Physical  Dana Mcpherson is a 30 y.o. [id number] with No LMP recorded. (Menstrual status: Oral contraceptives). admitted for a laparoscopic bilateral salpingectomy for sterilization, opts for salpingectomy due to ovarian cancer prophylaxis.    PMH:    Past Medical History:  Diagnosis Date   Allergy    Anxiety    Asthma    Autoimmune disorder ([place])    Complication of anesthesia    woke up during surgery & ithing after surgery   Family history of adverse reaction to anesthesia    "My mom has the same trouble with anesthesia".   GERD (gastroesophageal reflux disease)    Migraine headache    Nexplanon in place 10/05/2015   06/29/15 left arm   Polycystic ovarian syndrome    Pregnancy induced hypertension    Tachycardia    Urticaria    Vaginal Pap smear, abnormal     PSH:     Past Surgical History:  Procedure Laterality Date   BIOPSY  06/19/2019   Procedure: BIOPSY;  Surgeon: Daneil Dolin, MD;  [facility];  Service: Endoscopy;;   CHOLECYSTECTOMY  2010   COLONOSCOPY WITH PROPOFOL N/A 06/19/2019   Procedure: COLONOSCOPY WITH PROPOFOL;  Surgeon: Daneil Dolin, MD; 1 tubular adenoma, otherwise normal-appearing colonic mucosa, normal TI.  Stool sample for C. difficile negative.  Recommended repeat colonoscopy in 7 years.   ESOPHAGOGASTRODUODENOSCOPY     ESOPHAGOGASTRODUODENOSCOPY (EGD) WITH PROPOFOL N/A 04/08/2019   Procedure: ESOPHAGOGASTRODUODENOSCOPY (EGD) WITH PROPOFOL;  Surgeon: Daneil Dolin, MD; erosive reflux esophagitis s/p dilation, gastric mucosa congested diffusely with snakeskin appearance, antral erosions, congested eroded duodenal bulb, otherwise normal.   MALONEY DILATION N/A 04/08/2019   Procedure: Venia Minks DILATION;  Surgeon: Daneil Dolin, MD;  [facility];  Service: Endoscopy;  Laterality: N/A;   POLYPECTOMY  06/19/2019   Procedure: POLYPECTOMY;  Surgeon: Daneil Dolin, MD;  [facility]: [facility];  Service:  Endoscopy;;  cecal polyp    POb/GynH:      OB History     Gravida  3   Para  2   Term  2   Preterm      AB  1   Living  2      SAB  1   IAB      Ectopic      Multiple  0   Live Births  2        Obstetric Comments  IOL for pre-e         SH:   Social History   Tobacco Use   Smoking status: Former    Packs/day: 0.25    Years: 2.00    Pack years: 0.50    Types: Cigarettes    Quit date: 04/04/2010    Years since quitting: 10.5   Smokeless tobacco: Never  Vaping Use   Vaping Use: Never used  Substance Use Topics   Alcohol use: No   Drug use: No    FH:    Family History  Problem Relation Age of Onset   Cancer Paternal Grandmother        ovarian cancer   Other Mother        Ovarian Tumor   Hypertension Father    Asthma Son    ADD / ADHD Son    Asthma Son    Allergies Son    Colon cancer Other        late 25's   Diabetes Sister  Healthy Brother      Allergies:  Allergies  Allergen Reactions   Other Anaphylaxis and Other (See Comments)    Pt states that she is allergic to Axe/Tag body spray and Lysol.    General anesthesia - pt woke up several times, was scratching during surgery. Itching   Allergy to sun and bug bites.   Shellfish Allergy Anaphylaxis and Hives   Adhesive [Tape] Hives   Eggs Or Egg-Derived Products Itching and Nausea Only   Latex Itching    Medications:      No current facility-administered medications for this encounter.  Current Outpatient Medications:    esomeprazole (NEXIUM) 40 MG capsule, TALE 1 CAPSULE BY MOUTH TWICE DAILY WITH A MEAL. (Patient taking differently: Take 40 mg by mouth in the morning and at bedtime. TALE 1 CAPSULE BY MOUTH TWICE DAILY WITH A MEAL.), Disp: 60 capsule, Rfl: 5   Flaxseed, Linseed, (FLAXSEED OIL) 1200 MG CAPS, Take 1,200 mg by mouth 2 (two) times daily., Disp: , Rfl:    ivabradine (CORLANOR) 7.5 MG TABS tablet, Take 1 tablet (7.5 mg total) by mouth 2 (two) times daily with a  meal., Disp: 60 tablet, Rfl: 11   levonorgestrel-ethinyl estradiol (SEASONALE) 0.15-0.03 MG tablet, Take 1 tablet by mouth daily., Disp: 91 tablet, Rfl: 3   Multiple Vitamins-Minerals (ADULT ONE DAILY GUMMIES) CHEW, Chew 1 tablet by mouth in the morning and at bedtime., Disp: , Rfl:    topiramate (TOPAMAX) 25 MG capsule, Take 50 mg by mouth 2 (two) times daily., Disp: , Rfl:    albuterol (VENTOLIN HFA) 108 (90 Base) MCG/ACT inhaler, Inhale 1-2 puffs into the lungs every 6 (six) hours as needed., Disp: 18 g, Rfl: 2   cetirizine (ZYRTEC) 10 MG tablet, take two tablets by mouth up to twice daily., Disp: 120 tablet, Rfl: 5   EPINEPHrine (EPIPEN 2-PAK) 0.3 mg/0.3 mL IJ SOAJ injection, Inject 0.3 mg into the muscle once as needed for anaphylaxis., Disp: 2 each, Rfl: 1  Review of Systems:   Review of Systems  Constitutional: Negative for fever, chills, weight loss, malaise/fatigue and diaphoresis.  HENT: Negative for hearing loss, ear pain, nosebleeds, congestion, sore throat, neck pain, tinnitus and ear discharge.   Eyes: Negative for blurred vision, double vision, photophobia, pain, discharge and redness.  Respiratory: Negative for cough, hemoptysis, sputum production, shortness of breath, wheezing and stridor.   Cardiovascular: Negative for chest pain, palpitations, orthopnea, claudication, leg swelling and PND.  Gastrointestinal: Positive for abdominal pain. Negative for heartburn, nausea, vomiting, diarrhea, constipation, blood in stool and melena.  Genitourinary: Negative for dysuria, urgency, frequency, hematuria and flank pain.  Musculoskeletal: Negative for myalgias, back pain, joint pain and falls.  Skin: Negative for itching and rash.  Neurological: Negative for dizziness, tingling, tremors, sensory change, speech change, focal weakness, seizures, loss of consciousness, weakness and headaches.  Endo/Heme/Allergies: Negative for environmental allergies and polydipsia. Does not bruise/bleed  easily.  Psychiatric/Behavioral: Negative for depression, suicidal ideas, hallucinations, memory loss and substance abuse. The patient is not nervous/anxious and does not have insomnia.      PHYSICAL EXAM:  There were no vitals taken for this visit.    Vitals reviewed. Constitutional: She is oriented to person, place, and time. She appears well-developed and well-nourished.  HENT:  Head: Normocephalic and atraumatic.  Right Ear: External ear normal.  Left Ear: External ear normal.  Nose: Nose normal.  Mouth/Throat: Oropharynx is clear and moist.  Eyes: Conjunctivae and EOM are normal. Pupils are equal,  round, and reactive to light. Right eye exhibits no discharge. Left eye exhibits no discharge. No scleral icterus.  Neck: Normal range of motion. Neck supple. No tracheal deviation present. No thyromegaly present.  Cardiovascular: Normal rate, regular rhythm, normal heart sounds and intact distal pulses.  Exam reveals no gallop and no friction rub.   No murmur heard. Respiratory: Effort normal and breath sounds normal. No respiratory distress. She has no wheezes. She has no rales. She exhibits no tenderness.  GI: Soft. Bowel sounds are normal. She exhibits no distension and no mass. There is tenderness. There is no rebound and no guarding.  Genitourinary:       Vulva is normal without lesions Vagina is pink moist without discharge Cervix normal in appearance and pap is normal Uterus is normal size, contour, position, consistency, mobility, non-tender Adnexa is negative with normal sized ovaries by sonogram  Musculoskeletal: Normal range of motion. She exhibits no edema and no tenderness.  Neurological: She is alert and oriented to person, place, and time. She has normal reflexes. She displays normal reflexes. No cranial nerve deficit. She exhibits normal muscle tone. Coordination normal.  Skin: Skin is warm and dry. No rash noted. No erythema. No pallor.  Psychiatric: She has a normal  mood and affect. Her behavior is normal. Judgment and thought content normal.    Labs: Results for orders placed or performed during the hospital encounter of 10/27/20 (from the past 336 hour(s))  CBC   Collection Time: 10/27/20  7:59 AM  Result Value Ref Range   WBC 10.6 (H) 4.0 - 10.5 K/uL   RBC 4.84 3.87 - 5.11 MIL/uL   Hemoglobin 13.9 12.0 - 15.0 g/dL   HCT 42.6 36.0 - 46.0 %   MCV 88.0 80.0 - 100.0 fL   MCH 28.7 26.0 - 34.0 pg   MCHC 32.6 30.0 - 36.0 g/dL   RDW 13.3 11.5 - 15.5 %   Platelets 321 150 - 400 K/uL   nRBC 0.0 0.0 - 0.2 %  Comprehensive metabolic panel   Collection Time: 10/27/20  7:59 AM  Result Value Ref Range   Sodium 138 135 - 145 mmol/L   Potassium 3.8 3.5 - 5.1 mmol/L   Chloride 110 98 - 111 mmol/L   CO2 19 (L) 22 - 32 mmol/L   Glucose, Bld 98 70 - 99 mg/dL   BUN 7 6 - 20 mg/dL   Creatinine, Ser 0.72 0.44 - 1.00 mg/dL   Calcium 8.6 (L) 8.9 - 10.3 mg/dL   Total Protein 7.6 6.5 - 8.1 g/dL   Albumin 3.6 3.5 - 5.0 g/dL   AST 29 15 - 41 U/L   ALT 42 0 - 44 U/L   Alkaline Phosphatase 62 38 - 126 U/L   Total Bilirubin 0.4 0.3 - 1.2 mg/dL   GFR, Estimated >60 >60 mL/min   Anion gap 9 5 - 15  hCG, quantitative, pregnancy   Collection Time: 10/27/20  7:59 AM  Result Value Ref Range   hCG, Beta Chain, Quant, S <1 <5 mIU/mL  Rapid HIV screen (HIV 1/2 Ab+Ag)   Collection Time: 10/27/20  7:59 AM  Result Value Ref Range   HIV-1 P24 Antigen - HIV24 NON REACTIVE NON REACTIVE   HIV 1/2 Antibodies NON REACTIVE NON REACTIVE   Interpretation (HIV Ag Ab)      A non reactive test result means that HIV 1 or HIV 2 antibodies and HIV 1 p24 antigen were not detected in the specimen.  Urinalysis,  Routine w reflex microscopic Urine, Clean Catch   Collection Time: 10/27/20  8:01 AM  Result Value Ref Range   Color, Urine YELLOW YELLOW   APPearance CLOUDY (A) CLEAR   Specific Gravity, Urine 1.019 1.005 - 1.030   pH 5.0 5.0 - 8.0   Glucose, UA NEGATIVE NEGATIVE mg/dL    Hgb urine dipstick SMALL (A) NEGATIVE   Bilirubin Urine NEGATIVE NEGATIVE   Ketones, ur NEGATIVE NEGATIVE mg/dL   Protein, ur NEGATIVE NEGATIVE mg/dL   Nitrite NEGATIVE NEGATIVE   Leukocytes,Ua LARGE (A) NEGATIVE   RBC / HPF 6-10 0 - 5 RBC/hpf   WBC, UA 21-50 0 - 5 WBC/hpf   Bacteria, UA MANY (A) NONE SEEN   Squamous Epithelial / LPF 6-10 0 - 5   WBC Clumps PRESENT    Mucus PRESENT     EKG: Orders placed or performed during the hospital encounter of 08/11/20   ED EKG   ED EKG    Imaging Studies: No results found.    Assessment: Multiparous female desires permanent sterilization, opts for bilateral salpingectomy   Plan: Laparoscopic bilateral salpingectomy for permanent sterilization  Florian Buff 10/27/2020 5:28 PM

## 2020-10-27 NOTE — Telephone Encounter (Signed)
-----   Message from Valentina Shaggy, MD sent at 10/20/2020  5:43 AM EDT ----- NEW start Xolair for CIU. 300mg  monthly. Sample given last Friday.

## 2020-10-27 NOTE — Telephone Encounter (Signed)
Spoke to patient and advised approval, copay card and submit to [facility] for Xolair. She already has appt for next injection in [facility]

## 2020-10-28 ENCOUNTER — Encounter (HOSPITAL_COMMUNITY)
Admission: RE | Disposition: A | Discharge status: Home / Self Care | Payer: Self-pay | Source: Home / Self Care | Attending: Obstetrics & Gynecology

## 2020-10-28 ENCOUNTER — Ambulatory Visit (HOSPITAL_COMMUNITY): Payer: 59 | Admitting: Anesthesiology

## 2020-10-28 ENCOUNTER — Encounter (HOSPITAL_COMMUNITY): Payer: Self-pay | Admitting: Obstetrics & Gynecology

## 2020-10-28 ENCOUNTER — Ambulatory Visit (HOSPITAL_COMMUNITY)
Admission: RE | Admit: 2020-10-28 | Discharge: 2020-10-28 | Disposition: A | Discharge status: Home / Self Care | Payer: 59 | Attending: Obstetrics & Gynecology | Admitting: Obstetrics & Gynecology

## 2020-10-28 DIAGNOSIS — Z888 Allergy status to other drugs, medicaments and biological substances status: Secondary | ICD-10-CM | POA: Insufficient documentation

## 2020-10-28 DIAGNOSIS — Z793 Long term (current) use of hormonal contraceptives: Secondary | ICD-10-CM | POA: Insufficient documentation

## 2020-10-28 DIAGNOSIS — Z91013 Allergy to seafood: Secondary | ICD-10-CM | POA: Diagnosis not present

## 2020-10-28 DIAGNOSIS — Z91012 Allergy to eggs: Secondary | ICD-10-CM | POA: Diagnosis not present

## 2020-10-28 DIAGNOSIS — Z87891 Personal history of nicotine dependence: Secondary | ICD-10-CM | POA: Diagnosis not present

## 2020-10-28 DIAGNOSIS — Z302 Encounter for sterilization: Secondary | ICD-10-CM | POA: Diagnosis present

## 2020-10-28 DIAGNOSIS — Z79899 Other long term (current) drug therapy: Secondary | ICD-10-CM | POA: Insufficient documentation

## 2020-10-28 DIAGNOSIS — Z8041 Family history of malignant neoplasm of ovary: Secondary | ICD-10-CM | POA: Insufficient documentation

## 2020-10-28 DIAGNOSIS — Z87892 Personal history of anaphylaxis: Secondary | ICD-10-CM | POA: Insufficient documentation

## 2020-10-28 HISTORY — PX: LAPAROSCOPIC BILATERAL SALPINGECTOMY: SHX5889

## 2020-10-28 SURGERY — SALPINGECTOMY, BILATERAL, LAPAROSCOPIC
Anesthesia: General | Site: Abdomen | Laterality: Bilateral

## 2020-10-28 MED ORDER — LIDOCAINE HCL (PF) 2 % IJ SOLN
INTRAMUSCULAR | Status: AC
  Filled 2020-10-28: qty 5

## 2020-10-28 MED ORDER — MEPERIDINE HCL 50 MG/ML IJ SOLN: 6.2500 mg | INTRAMUSCULAR | Status: DC | PRN

## 2020-10-28 MED ORDER — LIDOCAINE HCL (CARDIAC) PF 100 MG/5ML IV SOSY
PREFILLED_SYRINGE | INTRAVENOUS | Status: DC | PRN
  Administered 2020-10-28: 80 mg via INTRATRACHEAL

## 2020-10-28 MED ORDER — DIPHENHYDRAMINE HCL 50 MG/ML IJ SOLN
25.0000 mg | Freq: Once | INTRAMUSCULAR | Status: AC
  Administered 2020-10-28: 25 mg via INTRAVENOUS

## 2020-10-28 MED ORDER — CEFAZOLIN SODIUM-DEXTROSE 2-4 GM/100ML-% IV SOLN
2.0000 g | INTRAVENOUS | Status: AC
  Administered 2020-10-28: 2 g via INTRAVENOUS
  Filled 2020-10-28: qty 100

## 2020-10-28 MED ORDER — FENTANYL CITRATE (PF) 250 MCG/5ML IJ SOLN
INTRAMUSCULAR | Status: AC
  Filled 2020-10-28: qty 5

## 2020-10-28 MED ORDER — LACTATED RINGERS IV SOLN: INTRAVENOUS | Status: DC

## 2020-10-28 MED ORDER — ORAL CARE MOUTH RINSE: 15.0000 mL | Freq: Once | OROMUCOSAL | Status: AC

## 2020-10-28 MED ORDER — SODIUM CHLORIDE 0.9 % IR SOLN
Status: DC | PRN
  Administered 2020-10-28: 1000 mL

## 2020-10-28 MED ORDER — ONDANSETRON HCL 4 MG/2ML IJ SOLN
INTRAMUSCULAR | Status: DC | PRN
  Administered 2020-10-28: 4 mg via INTRAVENOUS

## 2020-10-28 MED ORDER — DEXAMETHASONE SODIUM PHOSPHATE 10 MG/ML IJ SOLN
INTRAMUSCULAR | Status: AC
  Filled 2020-10-28: qty 1

## 2020-10-28 MED ORDER — FENTANYL CITRATE (PF) 100 MCG/2ML IJ SOLN
INTRAMUSCULAR | Status: DC | PRN
  Administered 2020-10-28: 50 ug via INTRAVENOUS
  Administered 2020-10-28: 100 ug via INTRAVENOUS
  Administered 2020-10-28 (×2): 50 ug via INTRAVENOUS

## 2020-10-28 MED ORDER — DIPHENHYDRAMINE HCL 50 MG/ML IJ SOLN
INTRAMUSCULAR | Status: DC | PRN
  Administered 2020-10-28: 25 mg via INTRAVENOUS

## 2020-10-28 MED ORDER — BUPIVACAINE LIPOSOME 1.3 % IJ SUSP
INTRAMUSCULAR | Status: AC
  Filled 2020-10-28: qty 20

## 2020-10-28 MED ORDER — ONDANSETRON 8 MG PO TBDP: 8.0000 mg | ORAL_TABLET | Freq: Three times a day (TID) | ORAL | 0 refills | Status: DC | PRN

## 2020-10-28 MED ORDER — BUPIVACAINE LIPOSOME 1.3 % IJ SUSP
20.0000 mL | Freq: Once | INTRAMUSCULAR | Status: DC
  Filled 2020-10-28: qty 20

## 2020-10-28 MED ORDER — MIDAZOLAM HCL 2 MG/2ML IJ SOLN
INTRAMUSCULAR | Status: AC
  Filled 2020-10-28: qty 2

## 2020-10-28 MED ORDER — HYDROCODONE-ACETAMINOPHEN 5-325 MG PO TABS: 1.0000 | ORAL_TABLET | Freq: Four times a day (QID) | ORAL | 0 refills | Status: DC | PRN

## 2020-10-28 MED ORDER — PROPOFOL 10 MG/ML IV BOLUS
INTRAVENOUS | Status: AC
  Filled 2020-10-28: qty 40

## 2020-10-28 MED ORDER — CHLORHEXIDINE GLUCONATE 0.12 % MT SOLN
15.0000 mL | Freq: Once | OROMUCOSAL | Status: AC
  Administered 2020-10-28: 15 mL via OROMUCOSAL
  Filled 2020-10-28: qty 15

## 2020-10-28 MED ORDER — SUGAMMADEX SODIUM 500 MG/5ML IV SOLN
INTRAVENOUS | Status: DC | PRN
  Administered 2020-10-28: 250 mg via INTRAVENOUS

## 2020-10-28 MED ORDER — KETOROLAC TROMETHAMINE 10 MG PO TABS: 10.0000 mg | ORAL_TABLET | Freq: Three times a day (TID) | ORAL | 0 refills | Status: DC | PRN

## 2020-10-28 MED ORDER — DEXAMETHASONE SODIUM PHOSPHATE 10 MG/ML IJ SOLN
INTRAMUSCULAR | Status: DC | PRN
  Administered 2020-10-28: 10 mg via INTRAVENOUS

## 2020-10-28 MED ORDER — POVIDONE-IODINE 10 % EX SWAB: 2.0000 "application " | Freq: Once | CUTANEOUS | Status: DC

## 2020-10-28 MED ORDER — KETOROLAC TROMETHAMINE 30 MG/ML IJ SOLN
30.0000 mg | Freq: Once | INTRAMUSCULAR | Status: AC
  Administered 2020-10-28: 30 mg via INTRAVENOUS
  Filled 2020-10-28: qty 1

## 2020-10-28 MED ORDER — PROPOFOL 10 MG/ML IV BOLUS
INTRAVENOUS | Status: DC | PRN
  Administered 2020-10-28: 250 mg via INTRAVENOUS

## 2020-10-28 MED ORDER — ONDANSETRON HCL 4 MG/2ML IJ SOLN: 4.0000 mg | Freq: Once | INTRAMUSCULAR | Status: DC | PRN

## 2020-10-28 MED ORDER — MIDAZOLAM HCL 2 MG/2ML IJ SOLN
INTRAMUSCULAR | Status: DC | PRN
  Administered 2020-10-28: 2 mg via INTRAVENOUS

## 2020-10-28 MED ORDER — SUCCINYLCHOLINE CHLORIDE 200 MG/10ML IV SOSY
PREFILLED_SYRINGE | INTRAVENOUS | Status: DC | PRN
  Administered 2020-10-28: 160 mg via INTRAVENOUS

## 2020-10-28 MED ORDER — ONDANSETRON HCL 4 MG/2ML IJ SOLN
INTRAMUSCULAR | Status: AC
  Filled 2020-10-28: qty 2

## 2020-10-28 MED ORDER — SUCCINYLCHOLINE CHLORIDE 200 MG/10ML IV SOSY
PREFILLED_SYRINGE | INTRAVENOUS | Status: AC
  Filled 2020-10-28: qty 10

## 2020-10-28 MED ORDER — ROCURONIUM BROMIDE 10 MG/ML (PF) SYRINGE
PREFILLED_SYRINGE | INTRAVENOUS | Status: AC
  Filled 2020-10-28: qty 10

## 2020-10-28 MED ORDER — DIPHENHYDRAMINE HCL 50 MG/ML IJ SOLN
INTRAMUSCULAR | Status: AC
  Filled 2020-10-28: qty 1

## 2020-10-28 MED ORDER — FENTANYL CITRATE (PF) 100 MCG/2ML IJ SOLN: 25.0000 ug | INTRAMUSCULAR | Status: DC | PRN

## 2020-10-28 MED ORDER — ROCURONIUM BROMIDE 10 MG/ML (PF) SYRINGE
PREFILLED_SYRINGE | INTRAVENOUS | Status: DC | PRN
  Administered 2020-10-28: 40 mg via INTRAVENOUS

## 2020-10-28 MED ORDER — BUPIVACAINE LIPOSOME 1.3 % IJ SUSP
INTRAMUSCULAR | Status: DC | PRN
  Administered 2020-10-28: 20 mL

## 2020-10-28 SURGICAL SUPPLY — 36 items
ADH SKN CLS APL DERMABOND .7 (GAUZE/BANDAGES/DRESSINGS) ×1
BAG HAMPER (MISCELLANEOUS) ×2 IMPLANT
BLADE SURG SZ11 CARB STEEL (BLADE) ×2 IMPLANT
CLOTH BEACON ORANGE TIMEOUT ST (SAFETY) ×2 IMPLANT
COVER LIGHT HANDLE STERIS (MISCELLANEOUS) ×4 IMPLANT
DERMABOND ADVANCED (GAUZE/BANDAGES/DRESSINGS) ×1
DERMABOND ADVANCED .7 DNX12 (GAUZE/BANDAGES/DRESSINGS) ×1 IMPLANT
ELECT REM PT RETURN 9FT ADLT (ELECTROSURGICAL) ×2
ELECTRODE REM PT RTRN 9FT ADLT (ELECTROSURGICAL) ×1 IMPLANT
GAUZE 4X4 16PLY ~~LOC~~+RFID DBL (SPONGE) ×2 IMPLANT
GLOVE SRG 8 PF TXTR STRL LF DI (GLOVE) ×1 IMPLANT
GLOVE SURG UNDER POLY LF SZ7 (GLOVE) ×6 IMPLANT
GLOVE SURG UNDER POLY LF SZ8 (GLOVE) ×2
GOWN STRL REUS W/TWL LRG LVL3 (GOWN DISPOSABLE) ×2 IMPLANT
GOWN STRL REUS W/TWL XL LVL3 (GOWN DISPOSABLE) ×2 IMPLANT
INST SET LAPROSCOPIC GYN AP (KITS) ×2 IMPLANT
KIT TURNOVER CYSTO (KITS) ×2 IMPLANT
NEEDLE HYPO 18GX1.5 BLUNT FILL (NEEDLE) ×2 IMPLANT
NEEDLE HYPO 21X1.5 SAFETY (NEEDLE) ×2 IMPLANT
NEEDLE INSUFFLATION 14GA 120MM (NEEDLE) ×2 IMPLANT
PACK PERI GYN (CUSTOM PROCEDURE TRAY) ×2 IMPLANT
PAD ARMBOARD 7.5X6 YLW CONV (MISCELLANEOUS) ×2 IMPLANT
SET BASIN LINEN APH (SET/KITS/TRAYS/PACK) ×2 IMPLANT
SET TUBE SMOKE EVAC HIGH FLOW (TUBING) ×2 IMPLANT
SHEARS HARMONIC ACE PLUS 36CM (ENDOMECHANICALS) ×2 IMPLANT
SLEEVE ENDOPATH XCEL 5M (ENDOMECHANICALS) ×2 IMPLANT
SOL ANTI FOG 6CC (MISCELLANEOUS) ×1 IMPLANT
SOLUTION ANTI FOG 6CC (MISCELLANEOUS) ×1
SPONGE T-LAP 4X18 ~~LOC~~+RFID (SPONGE) ×2 IMPLANT
SUT VICRYL 0 UR6 27IN ABS (SUTURE) ×2 IMPLANT
SUT VICRYL AB 3-0 FS1 BRD 27IN (SUTURE) ×4 IMPLANT
SYR 10ML LL (SYRINGE) ×2 IMPLANT
SYR 20ML LL LF (SYRINGE) ×4 IMPLANT
TROCAR ENDO BLADELESS 11MM (ENDOMECHANICALS) ×2 IMPLANT
TROCAR XCEL NON-BLD 5MMX100MML (ENDOMECHANICALS) ×2 IMPLANT
WARMER LAPAROSCOPE (MISCELLANEOUS) ×2 IMPLANT

## 2020-10-28 NOTE — Transfer of Care (Signed)
Immediate Anesthesia Transfer of Care Note  Patient: Dana Mcpherson  Procedure(s) Performed: LAPAROSCOPIC BILATERAL SALPINGECTOMY (Bilateral: Vagina )  Patient Location: PACU  Anesthesia Type:General  Level of Consciousness: awake and alert   Airway & Oxygen Therapy: Patient Spontanous Breathing and Patient connected to nasal cannula oxygen  Post-op Assessment: Report given to RN and Post -op Vital signs reviewed and stable  Post vital signs: Reviewed and stable  Last Vitals:  Vitals Value Taken Time  BP 146/78   Temp 98.9   Pulse 72   Resp    SpO2 100     Last Pain:  Vitals:   10/28/20 0645  TempSrc: Oral  PainSc: 0-No pain      Patients Stated Pain Goal: 5 (73/71/06 2694)  Complications: No notable events documented.

## 2020-10-28 NOTE — Anesthesia Preprocedure Evaluation (Signed)
Anesthesia Evaluation  Patient identified by MRN, date of birth, ID band Patient awake    Reviewed: Allergy & Precautions, NPO status , Patient's Chart, lab work & pertinent test results  History of Anesthesia Complications (+) Family history of anesthesia reaction and history of anesthetic complications  Airway Mallampati: II  TM Distance: >3 FB Neck ROM: Full    Dental  (+) Dental Advisory Given, Teeth Intact   Pulmonary shortness of breath, asthma , former smoker,    Pulmonary exam normal breath sounds clear to auscultation       Cardiovascular Exercise Tolerance: Good hypertension, Pt. on medications Normal cardiovascular exam Rhythm:Regular Rate:Normal     Neuro/Psych  Headaches, Anxiety    GI/Hepatic Neg liver ROS, GERD  Medicated and Controlled,  Endo/Other  negative endocrine ROS  Renal/GU negative Renal ROS     Musculoskeletal  (+) Arthritis  (upper back pain),   Abdominal   Peds  Hematology   Anesthesia Other Findings Autoimmune disorder Anaphylactic reaction to food   Reproductive/Obstetrics negative OB ROS                            Anesthesia Physical Anesthesia Plan  ASA: 2  Anesthesia Plan: General   Post-op Pain Management:    Induction: Intravenous  PONV Risk Score and Plan: 4 or greater and Ondansetron, Dexamethasone, Midazolam and Diphenhydramine  Airway Management Planned: Oral ETT  Additional Equipment:   Intra-op Plan:   Post-operative Plan: Extubation in OR  Informed Consent: I have reviewed the patients History and Physical, chart, labs and discussed the procedure including the risks, benefits and alternatives for the proposed anesthesia with the patient or authorized representative who has indicated his/her understanding and acceptance.     Dental advisory given  Plan Discussed with: CRNA and Surgeon  Anesthesia Plan Comments:          Anesthesia Quick Evaluation

## 2020-10-28 NOTE — Op Note (Signed)
Preoperative Diagnosis:  1.  Multiparous female desires permanent sterilization                                          2.  Elects to have bilateral salpingectomy for ovarian cancer prophylaxis  Postoperative Diagnosis:  Same as above  Procedure:  Laparoscopic Bilateral Salpingectomy for the purpose of permanent sterilization  Surgeon:  Jacelyn Grip MD  Anaesthesia: general  Findings:  Patient had normal pelvic anatomy and no intraperitoneal abnormalities.  Description of Operation:  Patient was taken to the OR and placed into supine position where she underwent general anaesthesia.   She was placed in the dorsal lithotomy position and prepped and draped in the usual sterile fashion.   An incision was made in the umbilicus and dissection taken down to the rectus fascia. A Veres needle was used to insufflate the periotneal cavity. An 11 mm non bladed video laparoscope trocar was then placed under direct visualization without difficulty.   The above noted findings were observed.   Two additional 5 mm non bladed trocars were placed in the right and left lower quadrants under direct visualization without difficulty.   The Harmonic scalpel was employed and a salpingectomy of both the right and left tubes was performed.   The tubes were removed from the peritoneal cavity and sent to pathology.   There was good hemostasis bilaterally.   The fascia, peritoneum and subcutaneous tissue were closed using 0 vicryl.   All 3 skin incisions were closed using 3-0 vicryl in a subcuticular fashion.  Exparel 266 mg 20 cc was injected in the 3 incisional/trocar sites.  The patient was awakened from anaesthesia and taken to the PACU with all counts being correct x 3.   The patient received  2 gram of ancef andToradol 30 mg IV preoperatively.   EBL minimal  Florian Buff 10/28/2020 8:35 AM

## 2020-10-28 NOTE — Anesthesia Postprocedure Evaluation (Signed)
Anesthesia Post Note  Patient: Dana Mcpherson  Procedure(s) Performed: LAPAROSCOPIC BILATERAL SALPINGECTOMY (Bilateral: Abdomen)  Patient location during evaluation: PACU Anesthesia Type: General Level of consciousness: awake and alert and oriented Pain management: pain level controlled Vital Signs Assessment: post-procedure vital signs reviewed and stable Respiratory status: spontaneous breathing and respiratory function stable Cardiovascular status: blood pressure returned to baseline and stable Postop Assessment: no apparent nausea or vomiting Anesthetic complications: no Comments: Patient still c/o itching but better than immediate postop,  received total of 50 mg of diphenhydramine    No notable events documented.   Last Vitals:  Vitals:   10/28/20 0930 10/28/20 0953  BP: 134/78 132/73  Pulse: 76 76  Resp: 12 16  Temp:  36.6 C  SpO2: 100% 100%    Last Pain:  Vitals:   10/28/20 0953  TempSrc: Oral  PainSc: 1                  Dana Mcpherson

## 2020-10-28 NOTE — Anesthesia Procedure Notes (Signed)
Procedure Name: Intubation Date/Time: 10/28/2020 7:42 AM Performed by: Karna Dupes, CRNA Pre-anesthesia Checklist: Patient identified, Emergency Drugs available, Suction available and Patient being monitored Patient Re-evaluated:Patient Re-evaluated prior to induction Oxygen Delivery Method: Circle system utilized Preoxygenation: Pre-oxygenation with 100% oxygen Induction Type: IV induction Ventilation: Mask ventilation without difficulty Laryngoscope Size: Mac and 3 Grade View: Grade I Tube type: Oral Tube size: 7.0 mm Number of attempts: 1 Airway Equipment and Method: Stylet Placement Confirmation: ETT inserted through vocal cords under direct vision, positive ETCO2 and breath sounds checked- equal and bilateral Secured at: 21 cm Tube secured with: Tape Dental Injury: Teeth and Oropharynx as per pre-operative assessment

## 2020-10-28 NOTE — Interval H&P Note (Signed)
History and Physical Interval Note:  10/28/2020 7:17 AM  Dana Mcpherson  has presented today for surgery, with the diagnosis of Desire Permanent Sterilization.  The various methods of treatment have been discussed with the patient and family. After consideration of risks, benefits and other options for treatment, the patient has consented to  Procedure(s): LAPAROSCOPIC BILATERAL SALPINGECTOMY (Bilateral) as a surgical intervention.  The patient's history has been reviewed, patient examined, no change in status, stable for surgery.  I have reviewed the patient's chart and labs.  Questions were answered to the patient's satisfaction.     Florian Buff

## 2020-10-28 NOTE — Telephone Encounter (Signed)
Thanks much!  Khamila Bassinger, MD [facility]  

## 2020-10-29 ENCOUNTER — Encounter (HOSPITAL_COMMUNITY): Payer: Self-pay | Admitting: Obstetrics & Gynecology

## 2020-10-29 LAB — SURGICAL PATHOLOGY

## 2020-10-30 ENCOUNTER — Encounter: Payer: Self-pay | Admitting: Obstetrics & Gynecology

## 2020-11-10 ENCOUNTER — Encounter: Payer: Self-pay | Admitting: Obstetrics & Gynecology

## 2020-11-10 ENCOUNTER — Ambulatory Visit (INDEPENDENT_AMBULATORY_CARE_PROVIDER_SITE_OTHER): Payer: 59 | Admitting: Obstetrics & Gynecology

## 2020-11-10 ENCOUNTER — Other Ambulatory Visit: Payer: Self-pay

## 2020-11-10 VITALS — BP 127/83 | HR 78 | Ht 66.0 in | Wt 242.0 lb

## 2020-11-10 DIAGNOSIS — Z9889 Other specified postprocedural states: Secondary | ICD-10-CM

## 2020-11-10 NOTE — Progress Notes (Signed)
  HPI: Patient returns for routine postoperative follow-up having undergone laparoscopic bilateral salpingectomy 10/28/20 .  The patient's immediate postoperative recovery has been unremarkable. Since hospital discharge the patient reports no rpbolems.   Current Outpatient Medications: cetirizine (ZYRTEC) 10 MG tablet, take two tablets by mouth up to twice daily., Disp: 120 tablet, Rfl: 5 EPINEPHrine (EPIPEN 2-PAK) 0.3 mg/0.3 mL IJ SOAJ injection, Inject 0.3 mg into the muscle once as needed for anaphylaxis., Disp: 2 each, Rfl: 1 esomeprazole (NEXIUM) 40 MG capsule, TALE 1 CAPSULE BY MOUTH TWICE DAILY WITH A MEAL. (Patient taking differently: Take 40 mg by mouth in the morning and at bedtime. TALE 1 CAPSULE BY MOUTH TWICE DAILY WITH A MEAL.), Disp: 60 capsule, Rfl: 5 Flaxseed, Linseed, (FLAXSEED OIL) 1200 MG CAPS, Take 1,200 mg by mouth 2 (two) times daily., Disp: , Rfl:  ivabradine (CORLANOR) 7.5 MG TABS tablet, Take 1 tablet (7.5 mg total) by mouth 2 (two) times daily with a meal., Disp: 60 tablet, Rfl: 11 levonorgestrel-ethinyl estradiol (SEASONALE) 0.15-0.03 MG tablet, Take 1 tablet by mouth daily., Disp: 91 tablet, Rfl: 3 Multiple Vitamins-Minerals (ADULT ONE DAILY GUMMIES) CHEW, Chew 1 tablet by mouth in the morning and at bedtime., Disp: , Rfl:  topiramate (TOPAMAX) 25 MG capsule, Take 50 mg by mouth 2 (two) times daily., Disp: , Rfl:  albuterol (VENTOLIN HFA) 108 (90 Base) MCG/ACT inhaler, Inhale 1-2 puffs into the lungs every 6 (six) hours as needed. (Patient not taking: Reported on 11/10/2020), Disp: 18 g, Rfl: 2  No current facility-administered medications for this visit.    Blood pressure 127/83, pulse 78, height 5\' 6"  (1.676 m), weight 242 lb (109.8 kg).  Physical Exam: 3 incisions look good No bruising  Diagnostic Tests:   Pathology: benign  Impression: .    ICD-10-CM   1. S/P laparoscopic bilateral salpingectomy 10/28/20  Z98.890        Plan: prn    Follow  up: prn   Florian Buff, MD

## 2020-11-13 ENCOUNTER — Ambulatory Visit (INDEPENDENT_AMBULATORY_CARE_PROVIDER_SITE_OTHER): Payer: 59

## 2020-11-13 ENCOUNTER — Other Ambulatory Visit: Payer: Self-pay

## 2020-11-13 DIAGNOSIS — L501 Idiopathic urticaria: Secondary | ICD-10-CM | POA: Diagnosis not present

## 2020-11-13 MED ORDER — OMALIZUMAB 150 MG ~~LOC~~ SOLR
300.0000 mg | SUBCUTANEOUS | Status: DC
  Administered 2020-11-13: 300 mg via SUBCUTANEOUS

## 2020-11-13 NOTE — Progress Notes (Signed)
Immunotherapy   Patient Details  Name: Dana Mcpherson MRN: [id number] Date of Birth: December 14, 1990  11/13/2020  Dana Mcpherson started injections for  Xolair, she waited in the office today for 30 minutes without any reactions. Following schedule: for Xolair Frequency:every 28 days Epi-Pen:Epi-Pen Available   Consent signed and patient instructions given.   Isabel Caprice 11/13/2020, 3:58 PM

## 2020-11-13 NOTE — Addendum Note (Signed)
Addended by: Isabel Caprice on: 11/13/2020 04:00 PM   Modules accepted: Orders

## 2020-11-17 ENCOUNTER — Telehealth: Payer: 59 | Admitting: Obstetrics & Gynecology

## 2020-11-18 ENCOUNTER — Encounter: Payer: Self-pay | Admitting: Physical Medicine and Rehabilitation

## 2020-11-18 ENCOUNTER — Ambulatory Visit (INDEPENDENT_AMBULATORY_CARE_PROVIDER_SITE_OTHER): Payer: 59 | Admitting: Physical Medicine and Rehabilitation

## 2020-11-18 DIAGNOSIS — R202 Paresthesia of skin: Secondary | ICD-10-CM

## 2020-11-18 DIAGNOSIS — R531 Weakness: Secondary | ICD-10-CM

## 2020-11-18 NOTE — Progress Notes (Signed)
Swelling in arms. Difficulty holding onto things and gripping. Symptoms are worse on right.  Right hand dominant No lotion today per patient

## 2020-11-20 NOTE — Progress Notes (Signed)
Dana N Schipani - 30 y.o. female MRN [id number]  Date of birth: June 29, 1990  Office Visit Note: Visit Date: 11/18/2020 PCP: [facility] Referred by: [facility]  Subjective: Chief Complaint  Patient presents with   Right Hand - Edema   Left Hand - Edema   HPI:  Dana Mcpherson is a 30 y.o. female who comes in today at the request of Dr. Vernelle Emerald for electrodiagnostic study of the Bilateral upper extremities.  Patient is Right hand dominant.  She reports chronic worsening swelling in both arms sometimes right more than left.  She reports difficulty gripping and holding objects with weakness.  She reports some paresthesias which are nondermatomal.  Some paresthesias in the feet as well.  No real complaints of radicular type pain.  No prior electrodiagnostic studies.  Patient is not diabetic.     ROS Otherwise per HPI.  Assessment & Plan: Visit Diagnoses:    ICD-10-CM   1. Paresthesia of skin  R20.2 NCV with EMG (electromyography)    2. Weakness  R53.1       Plan: Impression: Essentially NORMAL electrodiagnostic study of both upper limbs.  There is no significant electrodiagnostic evidence of nerve entrapment, brachial plexopathy, cervical radiculopathy or generalized peripheral neuropathy.    As you know, purely sensory or demyelinating radiculopathies and chemical radiculitis may not be detected with this particular electrodiagnostic study. **This electrodiagnostic study cannot rule out small fiber polyneuropathy and dysesthesias from central pain syndromes such as stroke or central pain sensitization syndromes such as fibromyalgia.  Myotomal referral pain from trigger points is also not excluded.  Recommendations: 1.  Follow-up with referring physician. 2.  Continue current management of symptoms.  Meds & Orders: No orders of the defined types were placed in this encounter.   Orders Placed This Encounter  Procedures   NCV with EMG  (electromyography)    Follow-up: Return for Vernelle Emerald, MD.   Procedures: No procedures performed  EMG & NCV Findings: All nerve conduction studies (as indicated in the following tables) were within normal limits.  Left vs. Right side comparison data for the ulnar motor nerve indicates abnormal L-R amplitude difference (47.3 %).  All remaining left vs. right side differences were within normal limits.    All examined muscles (as indicated in the following table) showed no evidence of electrical instability.    Impression: Essentially NORMAL electrodiagnostic study of both upper limbs.  There is no significant electrodiagnostic evidence of nerve entrapment, brachial plexopathy, cervical radiculopathy or generalized peripheral neuropathy.    As you know, purely sensory or demyelinating radiculopathies and chemical radiculitis may not be detected with this particular electrodiagnostic study. **This electrodiagnostic study cannot rule out small fiber polyneuropathy and dysesthesias from central pain syndromes such as stroke or central pain sensitization syndromes such as fibromyalgia.  Myotomal referral pain from trigger points is also not excluded.  Recommendations: 1.  Follow-up with referring physician. 2.  Continue current management of symptoms.  ___________________________ Laurence Spates FAAPMR Board Certified, American Board of Physical Medicine and Rehabilitation    Nerve Conduction Studies Anti Sensory Summary Table   Stim Site NR Peak (ms) Norm Peak (ms) P-T Amp (V) Norm P-T Amp Site1 Site2 Delta-P (ms) Dist (cm) Vel (m/s) Norm Vel (m/s)  Left Median Acr Palm Anti Sensory (2nd Digit)  31C  Wrist    2.9 <3.6 49.8 >10 Wrist Palm 1.2 0.0    Palm    1.7 <2.0 49.8  Right Median Acr Palm Anti Sensory (2nd Digit)  29.5C  Wrist    3.1 <3.6 44.2 >10 Wrist Palm 1.4 0.0    Palm    1.7 <2.0 45.9         Left Radial Anti Sensory (Base 1st Digit)  30.3C  Wrist    1.9 <3.1  49.8  Wrist Base 1st Digit 1.9 0.0    Right Radial Anti Sensory (Base 1st Digit)  29.7C  Wrist    2.1 <3.1 28.3  Wrist Base 1st Digit 2.1 0.0    Left Ulnar Anti Sensory (5th Digit)  31.3C  Wrist    3.1 <3.7 19.3 >15.0 Wrist 5th Digit 3.1 14.0 45 >38  Right Ulnar Anti Sensory (5th Digit)  29.6C  Wrist    3.1 <3.7 18.1 >15.0 Wrist 5th Digit 3.1 14.0 45 >38   Motor Summary Table   Stim Site NR Onset (ms) Norm Onset (ms) O-P Amp (mV) Norm O-P Amp Site1 Site2 Delta-0 (ms) Dist (cm) Vel (m/s) Norm Vel (m/s)  Left Median Motor (Abd Poll Brev)  30.8C  Wrist    3.0 <4.2 10.1 >5 Elbow Wrist 3.6 20.0 56 >50  Elbow    6.6  10.0         Right Median Motor (Abd Poll Brev)  29.6C  Wrist    3.0 <4.2 8.0 >5 Elbow Wrist 3.9 20.0 51 >50  Elbow    6.9  6.7         Left Ulnar Motor (Abd Dig Min)  31.1C  Wrist    2.5 <4.2 12.9 >3 B Elbow Wrist 3.0 18.5 62 >53  B Elbow    5.5  13.2  A Elbow B Elbow 1.1 10.0 91 >53  A Elbow    6.6  12.1         Right Ulnar Motor (Abd Dig Min)  30.2C  Wrist    2.9 <4.2 6.8 >3 B Elbow Wrist 2.9 20.0 69 >53  B Elbow    5.8  9.8  A Elbow B Elbow 1.3 11.0 85 >53  A Elbow    7.1  9.8          EMG   Side Muscle Nerve Root Ins Act Fibs Psw Amp Dur Poly Recrt Int Fraser Din Comment  Right 1stDorInt Ulnar C8-T1 Nml Nml Nml Nml Nml 0 Nml Nml   Right Abd Poll Brev Median C8-T1 Nml Nml Nml Nml Nml 0 Nml Nml   Right ExtDigCom   Nml Nml Nml Nml Nml 0 Nml Nml   Right Triceps Radial C6-7-8 Nml Nml Nml Nml Nml 0 Nml Nml   Right Deltoid Axillary C5-6 Nml Nml Nml Nml Nml 0 Nml Nml     Nerve Conduction Studies Anti Sensory Left/Right Comparison   Stim Site L Lat (ms) R Lat (ms) L-R Lat (ms) L Amp (V) R Amp (V) L-R Amp (%) Site1 Site2 L Vel (m/s) R Vel (m/s) L-R Vel (m/s)  Median Acr Palm Anti Sensory (2nd Digit)  31C  Wrist 2.9 3.1 0.2 49.8 44.2 11.2 Wrist Palm     Palm 1.7 1.7 0.0 49.8 45.9 7.8       Radial Anti Sensory (Base 1st Digit)  30.3C  Wrist 1.9 2.1 0.2 49.8 28.3 43.2  Wrist Base 1st Digit     Ulnar Anti Sensory (5th Digit)  31.3C  Wrist 3.1 3.1 0.0 19.3 18.1 6.2 Wrist 5th Digit 45 45 0   Motor Left/Right Comparison   Stim Site  L Lat (ms) R Lat (ms) L-R Lat (ms) L Amp (mV) R Amp (mV) L-R Amp (%) Site1 Site2 L Vel (m/s) R Vel (m/s) L-R Vel (m/s)  Median Motor (Abd Poll Brev)  30.8C  Wrist 3.0 3.0 0.0 10.1 8.0 20.8 Elbow Wrist 56 51 5  Elbow 6.6 6.9 0.3 10.0 6.7 33.0       Ulnar Motor (Abd Dig Min)  31.1C  Wrist 2.5 2.9 0.4 12.9 6.8 *47.3 B Elbow Wrist 62 69 7  B Elbow 5.5 5.8 0.3 13.2 9.8 25.8 A Elbow B Elbow 91 85 6  A Elbow 6.6 7.1 0.5 12.1 9.8 19.0          Waveforms:                     Clinical History: No specialty comments available.     Objective:  VS:  HT:    WT:   BMI:     BP:   HR: bpm  TEMP: ( )  RESP:  Physical Exam Musculoskeletal:        General: No swelling, tenderness or deformity.     Comments: Inspection reveals no atrophy of the bilateral APB or FDI or hand intrinsics. There is no swelling, color changes, allodynia or dystrophic changes. There is 5 out of 5 strength in the bilateral wrist extension, finger abduction and long finger flexion. There is intact sensation to light touch in all dermatomal and peripheral nerve distributions.  There is a negative Phalen's test bilaterally. There is a negative Hoffmann's test bilaterally.  Skin:    General: Skin is warm and dry.     Findings: No erythema or rash.  Neurological:     General: No focal deficit present.     Mental Status: She is alert and oriented to person, place, and time.     Motor: No weakness or abnormal muscle tone.     Coordination: Coordination normal.  Psychiatric:        Mood and Affect: Mood normal.        Behavior: Behavior normal.     Imaging: No results found.

## 2020-11-20 NOTE — Procedures (Signed)
EMG & NCV Findings: All nerve conduction studies (as indicated in the following tables) were within normal limits.  Left vs. Right side comparison data for the ulnar motor nerve indicates abnormal L-R amplitude difference (47.3 %).  All remaining left vs. right side differences were within normal limits.    All examined muscles (as indicated in the following table) showed no evidence of electrical instability.    Impression: Essentially NORMAL electrodiagnostic study of both upper limbs.  There is no significant electrodiagnostic evidence of nerve entrapment, brachial plexopathy, cervical radiculopathy or generalized peripheral neuropathy.    As you know, purely sensory or demyelinating radiculopathies and chemical radiculitis may not be detected with this particular electrodiagnostic study. **This electrodiagnostic study cannot rule out small fiber polyneuropathy and dysesthesias from central pain syndromes such as stroke or central pain sensitization syndromes such as fibromyalgia.  Myotomal referral pain from trigger points is also not excluded.  Recommendations: 1.  Follow-up with referring physician. 2.  Continue current management of symptoms.  ___________________________ Laurence Spates FAAPMR Board Certified, American Board of Physical Medicine and Rehabilitation    Nerve Conduction Studies Anti Sensory Summary Table   Stim Site NR Peak (ms) Norm Peak (ms) P-T Amp (V) Norm P-T Amp Site1 Site2 Delta-P (ms) Dist (cm) Vel (m/s) Norm Vel (m/s)  Left Median Acr Palm Anti Sensory (2nd Digit)  31C  Wrist    2.9 <3.6 49.8 >10 Wrist Palm 1.2 0.0    Palm    1.7 <2.0 49.8         Right Median Acr Palm Anti Sensory (2nd Digit)  29.5C  Wrist    3.1 <3.6 44.2 >10 Wrist Palm 1.4 0.0    Palm    1.7 <2.0 45.9         Left Radial Anti Sensory (Base 1st Digit)  30.3C  Wrist    1.9 <3.1 49.8  Wrist Base 1st Digit 1.9 0.0    Right Radial Anti Sensory (Base 1st Digit)  29.7C  Wrist    2.1 <3.1  28.3  Wrist Base 1st Digit 2.1 0.0    Left Ulnar Anti Sensory (5th Digit)  31.3C  Wrist    3.1 <3.7 19.3 >15.0 Wrist 5th Digit 3.1 14.0 45 >38  Right Ulnar Anti Sensory (5th Digit)  29.6C  Wrist    3.1 <3.7 18.1 >15.0 Wrist 5th Digit 3.1 14.0 45 >38   Motor Summary Table   Stim Site NR Onset (ms) Norm Onset (ms) O-P Amp (mV) Norm O-P Amp Site1 Site2 Delta-0 (ms) Dist (cm) Vel (m/s) Norm Vel (m/s)  Left Median Motor (Abd Poll Brev)  30.8C  Wrist    3.0 <4.2 10.1 >5 Elbow Wrist 3.6 20.0 56 >50  Elbow    6.6  10.0         Right Median Motor (Abd Poll Brev)  29.6C  Wrist    3.0 <4.2 8.0 >5 Elbow Wrist 3.9 20.0 51 >50  Elbow    6.9  6.7         Left Ulnar Motor (Abd Dig Min)  31.1C  Wrist    2.5 <4.2 12.9 >3 B Elbow Wrist 3.0 18.5 62 >53  B Elbow    5.5  13.2  A Elbow B Elbow 1.1 10.0 91 >53  A Elbow    6.6  12.1         Right Ulnar Motor (Abd Dig Min)  30.2C  Wrist    2.9 <4.2 6.8 >3 B Elbow Wrist  2.9 20.0 69 >53  B Elbow    5.8  9.8  A Elbow B Elbow 1.3 11.0 85 >53  A Elbow    7.1  9.8          EMG   Side Muscle Nerve Root Ins Act Fibs Psw Amp Dur Poly Recrt Int Fraser Din Comment  Right 1stDorInt Ulnar C8-T1 Nml Nml Nml Nml Nml 0 Nml Nml   Right Abd Poll Brev Median C8-T1 Nml Nml Nml Nml Nml 0 Nml Nml   Right ExtDigCom   Nml Nml Nml Nml Nml 0 Nml Nml   Right Triceps Radial C6-7-8 Nml Nml Nml Nml Nml 0 Nml Nml   Right Deltoid Axillary C5-6 Nml Nml Nml Nml Nml 0 Nml Nml     Nerve Conduction Studies Anti Sensory Left/Right Comparison   Stim Site L Lat (ms) R Lat (ms) L-R Lat (ms) L Amp (V) R Amp (V) L-R Amp (%) Site1 Site2 L Vel (m/s) R Vel (m/s) L-R Vel (m/s)  Median Acr Palm Anti Sensory (2nd Digit)  31C  Wrist 2.9 3.1 0.2 49.8 44.2 11.2 Wrist Palm     Palm 1.7 1.7 0.0 49.8 45.9 7.8       Radial Anti Sensory (Base 1st Digit)  30.3C  Wrist 1.9 2.1 0.2 49.8 28.3 43.2 Wrist Base 1st Digit     Ulnar Anti Sensory (5th Digit)  31.3C  Wrist 3.1 3.1 0.0 19.3 18.1 6.2 Wrist 5th  Digit 45 45 0   Motor Left/Right Comparison   Stim Site L Lat (ms) R Lat (ms) L-R Lat (ms) L Amp (mV) R Amp (mV) L-R Amp (%) Site1 Site2 L Vel (m/s) R Vel (m/s) L-R Vel (m/s)  Median Motor (Abd Poll Brev)  30.8C  Wrist 3.0 3.0 0.0 10.1 8.0 20.8 Elbow Wrist 56 51 5  Elbow 6.6 6.9 0.3 10.0 6.7 33.0       Ulnar Motor (Abd Dig Min)  31.1C  Wrist 2.5 2.9 0.4 12.9 6.8 *47.3 B Elbow Wrist 62 69 7  B Elbow 5.5 5.8 0.3 13.2 9.8 25.8 A Elbow B Elbow 91 85 6  A Elbow 6.6 7.1 0.5 12.1 9.8 19.0          Waveforms:

## 2020-11-23 ENCOUNTER — Telehealth: Payer: 59 | Admitting: Obstetrics & Gynecology

## 2020-11-30 ENCOUNTER — Telehealth: Payer: Self-pay

## 2020-11-30 NOTE — Telephone Encounter (Signed)
Patient called to see why she had a follow up visit scheduled for 12/02/20. I let the patient know it is her 6 week follow up. Patient states at the last visit Dr Ernst Bowler stated she did not need to come back for 3-6 Months. She is currently on Xolair and due back 12/11/2020. Dr Ernst Bowler is this okay to move her appt out ?   Thanks

## 2020-12-01 NOTE — Telephone Encounter (Signed)
Sure lets go ahead and do that.  Salvatore Marvel, MD [facility] [place] [facility] [place]

## 2020-12-02 ENCOUNTER — Ambulatory Visit: Payer: 59 | Admitting: Family

## 2020-12-03 ENCOUNTER — Other Ambulatory Visit: Payer: Self-pay | Admitting: Student

## 2020-12-11 ENCOUNTER — Ambulatory Visit (INDEPENDENT_AMBULATORY_CARE_PROVIDER_SITE_OTHER): Payer: 59

## 2020-12-11 ENCOUNTER — Other Ambulatory Visit: Payer: Self-pay

## 2020-12-11 DIAGNOSIS — L501 Idiopathic urticaria: Secondary | ICD-10-CM | POA: Diagnosis not present

## 2020-12-11 MED ORDER — OMALIZUMAB 150 MG/ML ~~LOC~~ SOSY
300.0000 mg | PREFILLED_SYRINGE | SUBCUTANEOUS | Status: DC
  Administered 2020-12-11 – 2021-06-02 (×6): 300 mg via SUBCUTANEOUS

## 2021-01-08 ENCOUNTER — Ambulatory Visit (INDEPENDENT_AMBULATORY_CARE_PROVIDER_SITE_OTHER): Payer: 59 | Admitting: *Deleted

## 2021-01-08 ENCOUNTER — Other Ambulatory Visit: Payer: Self-pay

## 2021-01-08 DIAGNOSIS — L501 Idiopathic urticaria: Secondary | ICD-10-CM

## 2021-01-12 ENCOUNTER — Ambulatory Visit: Payer: 59 | Admitting: Gastroenterology

## 2021-01-20 ENCOUNTER — Ambulatory Visit: Payer: 59 | Admitting: Gastroenterology

## 2021-01-20 ENCOUNTER — Encounter: Payer: Self-pay | Admitting: Internal Medicine

## 2021-02-03 ENCOUNTER — Ambulatory Visit: Payer: 59

## 2021-02-03 ENCOUNTER — Ambulatory Visit
Admission: EM | Admit: 2021-02-03 | Discharge: 2021-02-03 | Disposition: A | Discharge status: Home / Self Care | Payer: 59 | Attending: Family Medicine | Admitting: Family Medicine

## 2021-02-03 ENCOUNTER — Other Ambulatory Visit: Payer: Self-pay

## 2021-02-03 DIAGNOSIS — J029 Acute pharyngitis, unspecified: Secondary | ICD-10-CM | POA: Diagnosis not present

## 2021-02-03 DIAGNOSIS — R197 Diarrhea, unspecified: Secondary | ICD-10-CM

## 2021-02-03 DIAGNOSIS — R112 Nausea with vomiting, unspecified: Secondary | ICD-10-CM

## 2021-02-03 LAB — POCT RAPID STREP A (OFFICE): Rapid Strep A Screen: NEGATIVE

## 2021-02-03 MED ORDER — PROMETHAZINE HCL 25 MG PO TABS: 25.0000 mg | ORAL_TABLET | Freq: Four times a day (QID) | ORAL | 0 refills | Status: DC | PRN

## 2021-02-03 MED ORDER — LIDOCAINE VISCOUS HCL 2 % MT SOLN: 10.0000 mL | OROMUCOSAL | 0 refills | Status: DC | PRN

## 2021-02-03 NOTE — ED Provider Notes (Signed)
[facility]    CSN: [id number] Arrival date & time: 02/03/21  1024      History   Chief Complaint Chief Complaint  Patient presents with   Diarrhea   Nausea    HPI Dana Mcpherson is a 30 y.o. female.   Patient presenting today with 2-day history of nausea vomiting diarrhea and abdominal cramping and now 1 day history of severe sore, swollen throat.  She denies known fever, chills, chest pain, shortness of breath, rashes.  States that she has 1 son right now with a 24-hour stomach bug and another with strep throat.  She thinks she may have gotten both of their illnesses.  So far taking Phenergan that she had leftover from a previous illness with mild temporary relief of her nausea and vomiting.   Past Medical History:  Diagnosis Date   Allergy    Anxiety    Asthma    Autoimmune disorder ([place])    Complication of anesthesia    woke up during surgery & ithing after surgery   Family history of adverse reaction to anesthesia    "My mom has the same trouble with anesthesia".   GERD (gastroesophageal reflux disease)    Migraine headache    Nexplanon in place 10/05/2015   06/29/15 left arm   Polycystic ovarian syndrome    Pregnancy induced hypertension    Tachycardia    Urticaria    Vaginal Pap smear, abnormal     Patient Active Problem List   Diagnosis Date Noted   Encounter for sterilization    Positive ANA (antinuclear antibody) 08/31/2020   Weakness of both hands 08/31/2020   Upper back pain 08/31/2020   Mild intermittent asthma, uncomplicated 67/59/1638   Perennial allergic rhinitis 06/12/2020   Anaphylactic shock due to adverse food reaction 06/12/2020   Chronic urticaria 06/12/2020   Early satiety 03/11/2020   Abdominal pain 05/24/2019   Abdominal bloating 02/20/2019   Elevated LFTs 02/20/2019   Gastroesophageal reflux disease 02/20/2019   Dysphagia 02/20/2019   Diarrhea 02/20/2019   Atypical pneumonia 02/19/2016   Pulmonary infiltrates  02/18/2016   Hemorrhoid 06/29/2015   Hidradenitis suppurativa 06/23/2015   Shortness of breath 11/04/2014   Tachycardia 10/07/2014   ASCUS with positive high risk HPV 05/28/2014   PCOS (polycystic ovarian syndrome) 03/03/2014   Hyperinsulinemia 03/03/2014   Morbid obesity ([place]) 05/23/2013   DUB (dysfunctional uterine bleeding) 05/23/2013    Past Surgical History:  Procedure Laterality Date   BIOPSY  06/19/2019   Procedure: BIOPSY;  Surgeon: Daneil Dolin, MD;  [facility]: [facility];  Service: Endoscopy;;   CHOLECYSTECTOMY  2010   COLONOSCOPY WITH PROPOFOL N/A 06/19/2019   Procedure: COLONOSCOPY WITH PROPOFOL;  Surgeon: Daneil Dolin, MD; 1 tubular adenoma, otherwise normal-appearing colonic mucosa, normal TI.  Stool sample for C. difficile negative.  Recommended repeat colonoscopy in 7 years.   ESOPHAGOGASTRODUODENOSCOPY     ESOPHAGOGASTRODUODENOSCOPY (EGD) WITH PROPOFOL N/A 04/08/2019   Procedure: ESOPHAGOGASTRODUODENOSCOPY (EGD) WITH PROPOFOL;  Surgeon: Daneil Dolin, MD; erosive reflux esophagitis s/p dilation, gastric mucosa congested diffusely with snakeskin appearance, antral erosions, congested eroded duodenal bulb, otherwise normal.   LAPAROSCOPIC BILATERAL SALPINGECTOMY Bilateral 10/28/2020   Procedure: LAPAROSCOPIC BILATERAL SALPINGECTOMY;  Surgeon: Florian Buff, MD;  Location: [facility];  Service: Gynecology;  Laterality: Bilateral;   MALONEY DILATION N/A 04/08/2019   Procedure: Venia Minks DILATION;  Surgeon: Daneil Dolin, MD;  Location: [facility];  Service: Endoscopy;  Laterality: N/A;   POLYPECTOMY  06/19/2019   Procedure: POLYPECTOMY;  Surgeon: Daneil Dolin, MD;  [facility]: [facility];  Service: Endoscopy;;  cecal polyp    OB History     Gravida  3   Para  2   Term  2   Preterm      AB  1   Living  2      SAB  1   IAB      Ectopic      Multiple  0   Live Births  2        Obstetric Comments  IOL for pre-e          Home  Medications    Prior to Admission medications   Medication Sig Start Date End Date Taking? Authorizing Provider  lidocaine (XYLOCAINE) 2 % solution Use as directed 10 mLs in the mouth or throat as needed for mouth pain. 02/03/21  Yes Volney American, PA-C  promethazine (PHENERGAN) 25 MG tablet Take 1 tablet (25 mg total) by mouth every 6 (six) hours as needed for nausea or vomiting. 02/03/21  Yes Volney American, PA-C  albuterol (VENTOLIN HFA) 108 (90 Base) MCG/ACT inhaler Inhale 1-2 puffs into the lungs every 6 (six) hours as needed. Patient not taking: No sig reported 10/16/20   Valentina Shaggy, MD  cetirizine (ZYRTEC) 10 MG tablet take two tablets by mouth up to twice daily. 10/16/20   Valentina Shaggy, MD  CORLANOR 7.5 MG TABS tablet TAKE 1 TABLET BY MOUTH 2 TIMES DAILY WITH A MEAL. 12/03/20   Verta Ellen., NP  EPINEPHrine (EPIPEN 2-PAK) 0.3 mg/0.3 mL IJ SOAJ injection Inject 0.3 mg into the muscle once as needed for anaphylaxis. 10/16/20   Valentina Shaggy, MD  esomeprazole (NEXIUM) 40 MG capsule TALE 1 CAPSULE BY MOUTH TWICE DAILY WITH A MEAL. Patient taking differently: Take 40 mg by mouth in the morning and at bedtime. TALE 1 CAPSULE BY MOUTH TWICE DAILY WITH A MEAL. 09/15/20   Annitta Needs, NP  Flaxseed, Linseed, (FLAXSEED OIL) 1200 MG CAPS Take 1,200 mg by mouth 2 (two) times daily.    [provider]  levonorgestrel-ethinyl estradiol (SEASONALE) 0.15-0.03 MG tablet Take 1 tablet by mouth daily. 08/03/20   Roma Schanz, CNM  Multiple Vitamins-Minerals (ADULT ONE DAILY GUMMIES) CHEW Chew 1 tablet by mouth in the morning and at bedtime.    [provider]  topiramate (TOPAMAX) 25 MG capsule Take 50 mg by mouth 2 (two) times daily.    [provider]    Family History Family History  Problem Relation Age of Onset   Cancer Paternal Grandmother        ovarian cancer   Other Mother        Ovarian Tumor   Hypertension  Father    Asthma Son    ADD / ADHD Son    Asthma Son    Allergies Son    Colon cancer Other        late 78's   Diabetes Sister    Healthy Brother     Social History Social History   Tobacco Use   Smoking status: Former    Packs/day: 0.25    Years: 2.00    Pack years: 0.50    Types: Cigarettes    Quit date: 04/04/2010    Years since quitting: 10.8   Smokeless tobacco: Never  Vaping Use   Vaping Use: Never used  Substance Use Topics  Alcohol use: No   Drug use: No     Allergies   Other, Shellfish allergy, Adhesive [tape], Eggs or egg-derived products, and Latex   Review of Systems Review of Systems Per HPI  Physical Exam Triage Vital Signs ED Triage Vitals  Enc Vitals Group     BP 02/03/21 1143 122/89     Pulse Rate 02/03/21 1143 99     Resp 02/03/21 1143 18     Temp 02/03/21 1143 98.5 F (36.9 C)     Temp Source 02/03/21 1143 Oral     SpO2 02/03/21 1143 98 %     Weight --      Height --      Head Circumference --      Peak Flow --      Pain Score 02/03/21 1146 3     Pain Loc --      Pain Edu? --      Excl. in [place]? --    No data found.  Updated Vital Signs BP 122/89 (BP Location: Right Arm)   Pulse 99   Temp 98.5 F (36.9 C) (Oral)   Resp 18   SpO2 98%   Visual Acuity Right Eye Distance:   Left Eye Distance:   Bilateral Distance:    Right Eye Near:   Left Eye Near:    Bilateral Near:     Physical Exam Vitals and nursing note reviewed.  Constitutional:      Appearance: Normal appearance. She is not ill-appearing.  HENT:     Head: Atraumatic.     Right Ear: Tympanic membrane normal.     Left Ear: Tympanic membrane normal.     Nose: Nose normal.     Mouth/Throat:     Mouth: Mucous membranes are moist.     Pharynx: Oropharynx is clear. Posterior oropharyngeal erythema present. No oropharyngeal exudate.     Comments: Uvula midline, oral airway patent Eyes:     Extraocular Movements: Extraocular movements intact.      Conjunctiva/sclera: Conjunctivae normal.  Cardiovascular:     Rate and Rhythm: Normal rate and regular rhythm.     Heart sounds: Normal heart sounds.  Pulmonary:     Effort: Pulmonary effort is normal. No respiratory distress.     Breath sounds: Normal breath sounds. No wheezing or rales.  Abdominal:     General: Bowel sounds are normal. There is no distension.     Palpations: Abdomen is soft. There is no mass.     Tenderness: There is abdominal tenderness. There is no right CVA tenderness, left CVA tenderness, guarding or rebound.     Comments: Mild generalized tenderness to palpation diffusely across abdomen  Musculoskeletal:        General: Normal range of motion.     Cervical back: Normal range of motion and neck supple.  Skin:    General: Skin is warm and dry.  Neurological:     Mental Status: She is alert and oriented to person, place, and time.     Motor: No weakness.     Gait: Gait normal.  Psychiatric:        Mood and Affect: Mood normal.        Thought Content: Thought content normal.        Judgment: Judgment normal.   UC Treatments / Results  Labs (all labs ordered are listed, but only abnormal results are displayed) Labs Reviewed  CULTURE, GROUP A STREP ([place])  COVID-19, FLU A+B NAA  POCT  RAPID STREP A (OFFICE)   EKG  Radiology No results found.  Procedures Procedures (including critical care time)  Medications Ordered in UC Medications - No data to display  Initial Impression / Assessment and Plan / UC Course  I have reviewed the triage vital signs and the nursing notes.  Pertinent labs & imaging results that were available during my care of the patient were reviewed by me and considered in my medical decision making (see chart for details).     Vitals and exam overall reassuring today, rapid strep negative.  Throat culture and COVID, flu testing pending.  We will treat with Phenergan, viscous lidocaine for symptomatic improvement while awaiting  results.  Discussed continued over-the-counter medications and supportive home care.  Return for acutely worsening symptoms  Final Clinical Impressions(s) / UC Diagnoses   Final diagnoses:  Sore throat  Nausea vomiting and diarrhea   Discharge Instructions   None    ED Prescriptions     Medication Sig Dispense Auth. Provider   lidocaine (XYLOCAINE) 2 % solution Use as directed 10 mLs in the mouth or throat as needed for mouth pain. 100 mL Volney American, PA-C   promethazine (PHENERGAN) 25 MG tablet Take 1 tablet (25 mg total) by mouth every 6 (six) hours as needed for nausea or vomiting. 20 tablet Volney American, [place]      PDMP not reviewed this encounter.   Volney American, [place] 02/04/21 1235

## 2021-02-03 NOTE — ED Triage Notes (Signed)
Two day h/o not feeling well with an onset last night of emesis, nausea and diarrhea. Emesis x3. Denies cough and congestion. Has been taking phenergan with relief.  Children are sick with similar sxs.

## 2021-02-04 LAB — COVID-19, FLU A+B NAA
Influenza A, NAA: NOT DETECTED
Influenza B, NAA: NOT DETECTED
SARS-CoV-2, NAA: NOT DETECTED

## 2021-02-06 LAB — CULTURE, GROUP A STREP (THRC)

## 2021-02-22 ENCOUNTER — Telehealth: Payer: Self-pay

## 2021-02-22 NOTE — Telephone Encounter (Signed)
Called and left a voicemail asking for patient to return call to discuss.  °

## 2021-02-22 NOTE — Telephone Encounter (Signed)
Patient called to reschedule her Xolair appointment that she missed on 02/03/21 due to her having strep throat.   Patient states she is broken out in hives & her primary care has been treating her with Prednisone the last 2 weeks. Patient is wondering what else she should do?  She is scheduled for 11/2 to get her next Xolair.

## 2021-02-22 NOTE — Telephone Encounter (Signed)
Dr. Ernst Bowler please advise:  Patient called to reschedule her [place] appointment that she missed on 02/03/21 due to her having strep throat.    Patient states she is broken out in hives & her primary care has been treating her with Prednisone the last 2 weeks. Patient is wondering what else she should do?   She is scheduled for 11/2 to get her next Xolair.   She stated she just started prednisone and they gave her some cream (Triamcinolone) three times a day which hasn't worked it just spread all over her body, taking benadryl twice a day, using anti itch cream and it's covered her entire body.

## 2021-02-23 NOTE — Telephone Encounter (Signed)
I will make sure she is using Zyrtec 2 tablets twice daily.  We can add on hydroxyzine 25 mg to 50 mg at night to help with itching.  We can extend the prednisone, but I do not know much she is taking at this point.  If this is becoming the norm, we can try increasing her Xolair to every 2 weeks.  Salvatore Marvel, MD [facility] [place]

## 2021-02-23 NOTE — Telephone Encounter (Signed)
Patient was calling back because she has not heard [phone]

## 2021-02-23 NOTE — Telephone Encounter (Signed)
She's taking two tablets a day of prednisone, but she has to be careful how much she takes because it causes her blood pressure to elevate, she took two hydroxyzine last night and it didn't work. She hasn't slept in two nights. The itching is terrible. She has tried everything she could think of to stop it and nothing works.   Dr. Ernst Bowler please call patient   [place] [phone]

## 2021-02-24 ENCOUNTER — Ambulatory Visit: Payer: 59

## 2021-02-24 ENCOUNTER — Other Ambulatory Visit: Payer: Self-pay

## 2021-02-24 ENCOUNTER — Ambulatory Visit (INDEPENDENT_AMBULATORY_CARE_PROVIDER_SITE_OTHER): Payer: 59 | Admitting: Family

## 2021-02-24 ENCOUNTER — Encounter: Payer: Self-pay | Admitting: Family

## 2021-02-24 VITALS — BP 140/82 | HR 62 | Temp 97.9°F | Resp 16 | Ht 65.5 in | Wt 246.0 lb

## 2021-02-24 DIAGNOSIS — L501 Idiopathic urticaria: Secondary | ICD-10-CM | POA: Diagnosis not present

## 2021-02-24 DIAGNOSIS — R21 Rash and other nonspecific skin eruption: Secondary | ICD-10-CM

## 2021-02-24 DIAGNOSIS — J4520 Mild intermittent asthma, uncomplicated: Secondary | ICD-10-CM | POA: Diagnosis not present

## 2021-02-24 DIAGNOSIS — J3089 Other allergic rhinitis: Secondary | ICD-10-CM

## 2021-02-24 DIAGNOSIS — T7800XD Anaphylactic reaction due to unspecified food, subsequent encounter: Secondary | ICD-10-CM

## 2021-02-24 NOTE — Telephone Encounter (Signed)
Let's add her as a televisit.   Salvatore Marvel, MD [facility] [place]

## 2021-02-24 NOTE — Progress Notes (Signed)
[place], [place] [address] Dept: [phone]  FOLLOW UP NOTE  Patient ID: Dana Mcpherson, female    DOB: [birth year]  Age: 30 y.o. MRN: [id number] Date of Office Visit: 02/24/2021  Assessment  Chief Complaint: Follow-up (Patient in today for a follow up she is covered in a rash and is not sure what she is allergic to. The rash has been there for 3 weeks and night time is worse. Mostly on her trunk and legs. She can not take steroids due to tachycardia.)  HPI Dana Mcpherson 30 year old female who presents today for an acute visit of itching.  She was last seen on October 16, 2020 by Dr. Ernst Bowler for mild intermittent asthma, perennial allergic rhinitis, anaphylactic shock due to food, chronic urticaria, joint pains in hands wrists in the setting of elevated inflammatory markers-currently sees rheumatology, and tachycardia exacerbated by recent steroid course for sinusitis.  Since her last office visit she has had laparoscopic bilateral salpingectomy for sterilization on October 28, 2020.  She reports that for past 3 weeks she has had itching with a rash.  The rash started on her breast and spread to her thighs, legs, abdomen, and now back.  She went to her primary care physician's office and was told that it was contact dermatitis.  She was given a cream that she does not know the name of and instructed to come back if it does not get any better.  Prior to going to her primary care physician's office she tried a yeast cream, anti-itch cream, hydrocortisone cream and 2 Benadryl at night.  This Monday she went back to her primary care physician where they instructed her again that it was contact dermatitis and they added on prednisone 20 mg twice a day for 7 days she thinks, hydroxyzine 1 to 2 tablets at night, and triamcinolone 0.1%. She is also taking Zyrtec 10 mg 2 tablets twice a day and Xolair injections every 4 weeks. Since starting this regimen she has not noticed a change in  her itching/rash.  She mentions that she cannot take a lot of prednisone due to her heart condition.  She has not slept in 3 weeks and her itching is worse at night because she gets hot and has polycystic ovarian syndrome.  She denies anyone else in her household has this rash, she has not tried any new products, there has been no change in her diets, and no recent bug bites.  She also denies fever and joint pain.  She mentions that a week or 2 before the rash started she did have strep throat.  After looking in Epic she did go to urgent care on October 12 and her group A strep culture was negative.  She was given lidocaine solution and Phenergan as needed.  She denies any bruising with the rashes, but mentions that she did have 1 bruise at one point time, but that could have been from scratching. .  Prior to getting her Xolair injection today while in the office her last Xolair injection was on January 08, 2021.  She reports that she missed Xolair injections due to sickness in the family.    Mild intermittent asthma is reported as moderately controlled with albuterol as needed.  She reports shortness of breath and tightness in her chest when she is on prednisone and with her heart medications.  She denies coughing, wheezing, and nocturnal awakenings due to breathing problems.  She reports that she has had the  symptoms of shortness of breath and tightness in her chest in the past with prednisone and her heart medications.  Since her last office visit she has not required any trips to the emergency room or urgent care due to breathing problems.  She has been on 1 round of steroids and this is the steroids that she is on now for itching.  She only uses her albuterol if she is sick and reports that she does not want to use her albuterol a lot because it has a steroid in it.  Instructed her that albuterol does not have steroid in it.  Perennial allergic rhinitis is reported as moderately controlled with Zyrtec 10  mg 2 tablets twice a day and sometimes she will use her son's fluticasone nasal spray.  She reports nasal congestion since the rash and itching has occurred.  She denies rhinorrhea and postnasal drip.  She has had maybe once sinus infections since we last saw her.  She continues to avoid seafood and egg without any accidental ingestion or use of her EpiPen.  She reports that her EpiPen is up-to-date.   Drug Allergies:  Allergies  Allergen Reactions   Other Anaphylaxis and Other (See Comments)    Pt states that she is allergic to Axe/Tag body spray and Lysol.    General anesthesia - pt woke up several times, was scratching during surgery. Itching   Allergy to sun and bug bites.   Shellfish Allergy Anaphylaxis and Hives   Adhesive [Tape] Hives   Eggs Or Egg-Derived Products Itching and Nausea Only   Latex Itching    Review of Systems: Review of Systems  Constitutional:  Negative for chills and fever.  HENT:  Positive for congestion.        Reports nasal congestion and denies rhinorrhea and postnasal drip  Eyes:        Denies itchy watery eyes  Respiratory:  Positive for shortness of breath. Negative for cough and wheezing.        Reports shortness of breath and tightness in her chest when on her heart medication and prednisone.  She reports that she is had this happen in the past  Cardiovascular:  Positive for palpitations. Negative for chest pain.       Reports that she will have palpitations when taking prednisone and on her heart medication  Gastrointestinal:        Reports that she is currently taking Nexium for heartburn or reflux symptoms  Genitourinary:  Negative for frequency.  Skin:  Positive for itching and rash.  Neurological:  Positive for headaches.       Reports migraines for which she takes Topamax  Endo/Heme/Allergies:  Positive for environmental allergies.    Physical Exam: BP 140/82   Pulse 62   Temp 97.9 F (36.6 C) (Temporal)   Resp 16   Ht 5' 5.5"  (1.664 m)   Wt 246 lb (111.6 kg)   SpO2 96%   BMI 40.31 kg/m    Physical Exam Constitutional:      Appearance: Normal appearance.  HENT:     Head: Normocephalic and atraumatic.     Comments: Pharynx normal, eyes normal, ears normal, nose: Bilateral lower turbinates mildly edematous and slightly erythematous with no drainage noted    Right Ear: Tympanic membrane, ear canal and external ear normal.     Left Ear: Tympanic membrane, ear canal and external ear normal.     Mouth/Throat:     Mouth: Mucous membranes are moist.  Pharynx: Oropharynx is clear.  Eyes:     Conjunctiva/sclera: Conjunctivae normal.  Cardiovascular:     Rate and Rhythm: Regular rhythm.     Pulses: Normal pulses.     Heart sounds: Normal heart sounds.  Pulmonary:     Effort: Pulmonary effort is normal.     Breath sounds: Normal breath sounds.     Comments: Lungs clear to auscultation Musculoskeletal:     Cervical back: Neck supple.  Skin:    General: Skin is warm.     Comments: Fine papular slightly erythematous rash noted on bilateral lower forearms and popliteal fossa.  Neurological:     Mental Status: She is alert and oriented to person, place, and time.  Psychiatric:        Mood and Affect: Mood normal.        Behavior: Behavior normal.        Thought Content: Thought content normal.        Judgment: Judgment normal.    Diagnostics: FVC 2.79 L, FEV1 2.39 L (73%).  Predicted FVC 3.92 L, predicted FEV1 3.28 L.  Spirometry indicates possible mild restriction.  Spirometry is consistent with previous spirometry  Assessment and Plan: 1. Rash and nonspecific skin eruption   2. Idiopathic urticaria   3. Mild intermittent asthma, uncomplicated   4. Perennial allergic rhinitis   5. Anaphylactic shock due to food, subsequent encounter     No orders of the defined types were placed in this encounter.   Patient Instructions  1. Mild intermittent asthma, uncomplicated - Continue with albuterol 4  puffs every every 4-6 hours as needed for cough, wheeze, tightness in chest, or shortness of breath. (This is not a steroid)  2. Chronic rhinitis (dust mites, cockroach) - Continue with your antihistamines.  3. Anaphylactic shock due to food (seafood, egg) - Avoid seafood and eff. In case of an allergic reaction, give Benadryl 4 teaspoonfuls every 4 hours, and if life-threatening symptoms occur, inject with EpiPen 0.3 mg.   4. Chronic urticaria with elevated ANA and elevated inflammatory markers - Continue with suppressive dosing of antihistamines: Zyrtec two tablets up to twice daily -  Continue Xolair every 4 weeks ( Xolair injection given today) -   5. Rash not responding to steroids -Continue prednisone as prescribed by your primary care physician -Continue triamcinolone 1% twice a day as needed to red itchy areas as prescribed by your primary care physician.  Do not use on face, neck, groin, or armpit region -Continue hydroxyzine 25 mg to 50 mg at night as prescribed by your primary care physician.  Caution as this can make you drowsy -Try stopping your Zyrtec 10 mg 2 tablets twice a day and in its place start Allegra 2 tablets twice a day -We will refer you to dermatology to get a biopsy since you are not responding to steroids, Xolair, and antihistamines.   Please let us know if this treatment plan is not working well for you Schedule a follow-up appointment in 2 months or sooner if needed       Return in about 2 months (around 04/26/2021), or if symptoms worsen or fail to improve.    Thank you for the opportunity to care for this patient.  Please do not hesitate to contact me with questions.  Althea Charon, FNP [facility] [place]

## 2021-02-24 NOTE — Addendum Note (Signed)
Addended by: Tommas Olp B on: 02/24/2021 05:04 PM   Modules accepted: Orders

## 2021-02-24 NOTE — Patient Instructions (Addendum)
1. Mild intermittent asthma, uncomplicated - Continue with albuterol 4 puffs every every 4-6 hours as needed for cough, wheeze, tightness in chest, or shortness of breath. (This is not a steroid)  2. Chronic rhinitis (dust mites, cockroach) - Continue with your antihistamines.  3. Anaphylactic shock due to food (seafood, egg) - Avoid seafood and eff. In case of an allergic reaction, give Benadryl 4 teaspoonfuls every 4 hours, and if life-threatening symptoms occur, inject with EpiPen 0.3 mg.   4. Chronic urticaria with elevated ANA and elevated inflammatory markers - Continue with suppressive dosing of antihistamines: Zyrtec two tablets up to twice daily -  Continue Xolair every 4 weeks ( Xolair injection given today) -   5. Rash not responding to steroids -Continue prednisone as prescribed by your primary care physician -Continue triamcinolone 1% twice a day as needed to red itchy areas as prescribed by your primary care physician.  Do not use on face, neck, groin, or armpit region -Continue hydroxyzine 25 mg to 50 mg at night as prescribed by your primary care physician.  Caution as this can make you drowsy -Try stopping your Zyrtec 10 mg 2 tablets twice a day and in its place start Allegra 2 tablets twice a day -We will refer you to dermatology to get a biopsy since you are not responding to steroids, Xolair, and antihistamines.   Please let us know if this treatment plan is not working well for you Schedule a follow-up appointment in 2 months or sooner if needed

## 2021-03-01 ENCOUNTER — Encounter: Payer: Self-pay | Admitting: Internal Medicine

## 2021-03-01 ENCOUNTER — Other Ambulatory Visit: Payer: Self-pay

## 2021-03-01 ENCOUNTER — Ambulatory Visit (INDEPENDENT_AMBULATORY_CARE_PROVIDER_SITE_OTHER): Payer: 59 | Admitting: Internal Medicine

## 2021-03-01 VITALS — BP 161/77 | HR 80 | Resp 12 | Ht 66.5 in | Wt 248.4 lb

## 2021-03-01 DIAGNOSIS — L508 Other urticaria: Secondary | ICD-10-CM | POA: Diagnosis not present

## 2021-03-01 DIAGNOSIS — R768 Other specified abnormal immunological findings in serum: Secondary | ICD-10-CM

## 2021-03-01 DIAGNOSIS — R21 Rash and other nonspecific skin eruption: Secondary | ICD-10-CM | POA: Insufficient documentation

## 2021-03-01 NOTE — Progress Notes (Signed)
Office Visit Note  Patient: Dana Mcpherson             Date of Birth: 04/02/1991           MRN: [id number]             PCP: [facility] Referring: [facility] Visit Date: 03/01/2021   Subjective:   History of Present Illness: Dana N Sanpedro is a 30 y.o. female here for follow up with ongoing join pains, hand numbness, urticaria, and hydradenitis previous workup did not indicate specific positive serology or clinical findings. Symptoms are new since about 1 month ago developed itchy red rashes starting around her breast subsequently spreading to involve her thighs legs abdomen and back and then upper extremities.  There were no significant medical events except a visit to the ED with sore throat throat about 2 weeks group A strep culture in the urgent care was negative.  This was initially suspected to be contact dermatitis and primary care clinic was treated with a topical cream. This did not improve significantly.  She was subsequently prescribed a steroid treatment but also did not have much response to taking prednisone 40 mg/day hydroxyzine and topical triamcinolone.  She saw allergy clinic where is noted she had missed some Xolair injection September but otherwise continued on this.  She was switched from cetirizine to loratadine antihistamine treatment.  Otherwise she has not experienced any other exacerbation of asthma or her typical allergic rhinitis symptoms.  The skin rashes were not very responsive to hydroxyzine or the topical triamcinolone so is no longer taking these.  She does not have a known other allergic exposures she cannot recall changes to detergents chemicals food allergy exposures or any environmental changes.  Previous HPI 09/22/20 Dana N Paradiso is a 30 y.o. female here for follow up for ongoing neck and bilateral upper back and hand pain with positive ANA antibodies and increased inflammatory markers.  Since the last visit additional antibody  panel testing was negative she continues to have the same degree of symptoms.  Besides pain she does continue to notice intermittent numbness and sometimes dropping her difficulty gripping tightly with her hands.  She feels cystic and hidradenitis skin disease is currently equal or better than usual for her at present.  Urticarial rashes remain intermittent not acting up today.    Review of Systems  Musculoskeletal:  Positive for joint pain and joint pain.  Skin:  Positive for rash.  Neurological:  Positive for numbness and parasthesias.  Hematological:  Negative for swollen glands.   PMFS History:  Patient Active Problem List   Diagnosis Date Noted   Rash and other nonspecific skin eruption 03/01/2021   Encounter for sterilization    Positive ANA (antinuclear antibody) 08/31/2020   Weakness of both hands 08/31/2020   Upper back pain 08/31/2020   Mild intermittent asthma, uncomplicated 50/06/7046   Perennial allergic rhinitis 06/12/2020   Anaphylactic shock due to adverse food reaction 06/12/2020   Chronic urticaria 06/12/2020   Early satiety 03/11/2020   Abdominal pain 05/24/2019   Abdominal bloating 02/20/2019   Elevated LFTs 02/20/2019   Gastroesophageal reflux disease 02/20/2019   Dysphagia 02/20/2019   Diarrhea 02/20/2019   Atypical pneumonia 02/19/2016   Pulmonary infiltrates 02/18/2016   Hemorrhoid 06/29/2015   Hidradenitis suppurativa 06/23/2015   Shortness of breath 11/04/2014   Tachycardia 10/07/2014   ASCUS with positive high risk HPV 05/28/2014   PCOS (polycystic ovarian syndrome) 03/03/2014   Hyperinsulinemia 03/03/2014  Morbid obesity ([place]) 05/23/2013   DUB (dysfunctional uterine bleeding) 05/23/2013    Past Medical History:  Diagnosis Date   Allergy    Anxiety    Asthma    Autoimmune disorder ([place])    Complication of anesthesia    woke up during surgery & ithing after surgery   Family history of adverse reaction to anesthesia    "My mom has the same  trouble with anesthesia".   GERD (gastroesophageal reflux disease)    Migraine headache    Nexplanon in place 10/05/2015   06/29/15 left arm   Polycystic ovarian syndrome    Pregnancy induced hypertension    Tachycardia    Urticaria    Vaginal Pap smear, abnormal     Family History  Problem Relation Age of Onset   Cancer Paternal Grandmother        ovarian cancer   Other Mother        Ovarian Tumor   Hypertension Father    Asthma Son    ADD / ADHD Son    Asthma Son    Allergies Son    Colon cancer Other        late 42's   Diabetes Sister    Healthy Brother    Past Surgical History:  Procedure Laterality Date   BIOPSY  06/19/2019   Procedure: BIOPSY;  Surgeon: Daneil Dolin, MD;  [facility];  Service: Endoscopy;;   CHOLECYSTECTOMY  2010   COLONOSCOPY WITH PROPOFOL N/A 06/19/2019   Procedure: COLONOSCOPY WITH PROPOFOL;  Surgeon: Daneil Dolin, MD; 1 tubular adenoma, otherwise normal-appearing colonic mucosa, normal TI.  Stool sample for C. difficile negative.  Recommended repeat colonoscopy in 7 years.   ESOPHAGOGASTRODUODENOSCOPY     ESOPHAGOGASTRODUODENOSCOPY (EGD) WITH PROPOFOL N/A 04/08/2019   Procedure: ESOPHAGOGASTRODUODENOSCOPY (EGD) WITH PROPOFOL;  Surgeon: Daneil Dolin, MD; erosive reflux esophagitis s/p dilation, gastric mucosa congested diffusely with snakeskin appearance, antral erosions, congested eroded duodenal bulb, otherwise normal.   LAPAROSCOPIC BILATERAL SALPINGECTOMY Bilateral 10/28/2020   Procedure: LAPAROSCOPIC BILATERAL SALPINGECTOMY;  Surgeon: Florian Buff, MD;  Location: [facility];  Service: Gynecology;  Laterality: Bilateral;   MALONEY DILATION N/A 04/08/2019   Procedure: Venia Minks DILATION;  Surgeon: Daneil Dolin, MD;  Location: [facility];  Service: Endoscopy;  Laterality: N/A;   POLYPECTOMY  06/19/2019   Procedure: POLYPECTOMY;  Surgeon: Daneil Dolin, MD;  [facility];  Service: Endoscopy;;  cecal polyp   Social  History   Social History Narrative   Not on file   Immunization History  Administered Date(s) Administered   HPV Quadrivalent 06/22/2011   Influenza-Unspecified 03/01/2006   Td 07/25/2002     Objective: Vital Signs: BP (!) 161/77 (BP Location: Left Arm, Patient Position: Sitting)   Pulse 80   Resp 12   Ht 5' 6.5" (1.689 m)   Wt 248 lb 6.4 oz (112.7 kg)   BMI 39.49 kg/m    Physical Exam Constitutional:      Appearance: She is obese.  Eyes:     Conjunctiva/sclera: Conjunctivae normal.  Cardiovascular:     Rate and Rhythm: Normal rate and regular rhythm.  Pulmonary:     Effort: Pulmonary effort is normal.     Breath sounds: Normal breath sounds.  Skin:    General: Skin is warm and dry.     Comments: Raised, skin colored papules on right forearm flexor surface and on right flank along bottom of bra line, nontender, no erythema or  induration  Neurological:     Mental Status: She is alert.  Psychiatric:        Mood and Affect: Mood normal.     Musculoskeletal Exam:  Elbows full ROM no tenderness or swelling Wrists full ROM no tenderness or swelling Fingers full ROM no tenderness or swelling Knees full ROM no tenderness or swelling  Investigation: No additional findings.  Imaging: No results found.  Recent Labs: Lab Results  Component Value Date   WBC 10.6 (H) 10/27/2020   HGB 13.9 10/27/2020   PLT 321 10/27/2020   NA 138 10/27/2020   K 3.8 10/27/2020   CL 110 10/27/2020   CO2 19 (L) 10/27/2020   GLUCOSE 98 10/27/2020   BUN 7 10/27/2020   CREATININE 0.72 10/27/2020   BILITOT 0.4 10/27/2020   ALKPHOS 62 10/27/2020   AST 29 10/27/2020   ALT 42 10/27/2020   PROT 7.6 10/27/2020   ALBUMIN 3.6 10/27/2020   CALCIUM 8.6 (L) 10/27/2020   GFRAA >60 05/19/2019    Speciality Comments: No specialty comments available.  Procedures:  No procedures performed Allergies: Other, Shellfish allergy, Adhesive [tape], Eggs or egg-derived products, and Latex    Assessment / Plan:     Visit Diagnoses: Rash and other nonspecific skin eruption - Plan: C3 and C4, C-reactive protein, CBC with Differential/Platelet  Skin rash does not look particular for any specific systemic connective tissue disease.  She describes it initially as erythematous papule type rash on the trunk but this is not present today. Alternatively could be some type of response after a proceeding viral URI syndrome described prior to onset, with negative strep culture. We will recheck previously abnormal serology including complements and CRP and CBC today.  Ruling out hypocomplementemia or thrombocytopenia in particular.      Orders: Orders Placed This Encounter  Procedures   C3 and C4   C-reactive protein   CBC with Differential/Platelet    No orders of the defined types were placed in this encounter.    Follow-Up Instructions: No follow-ups on file.   Collier Salina, MD  Note - This record has been created using [employer].  Chart creation errors have been sought, but may not always  have been located. Such creation errors do not reflect on  the standard of medical care.

## 2021-03-02 ENCOUNTER — Telehealth: Payer: Self-pay

## 2021-03-02 LAB — CBC WITH DIFFERENTIAL/PLATELET
Absolute Monocytes: 632 cells/uL (ref 200–950)
Basophils Absolute: 51 cells/uL (ref 0–200)
Basophils Relative: 0.5 %
Eosinophils Absolute: 347 cells/uL (ref 15–500)
Eosinophils Relative: 3.4 %
HCT: 44 % (ref 35.0–45.0)
Hemoglobin: 14.2 g/dL (ref 11.7–15.5)
Lymphs Abs: 2683 cells/uL (ref 850–3900)
MCH: 27.5 pg (ref 27.0–33.0)
MCHC: 32.3 g/dL (ref 32.0–36.0)
MCV: 85.3 fL (ref 80.0–100.0)
MPV: 10 fL (ref 7.5–12.5)
Monocytes Relative: 6.2 %
Neutro Abs: 6487 cells/uL (ref 1500–7800)
Neutrophils Relative %: 63.6 %
Platelets: 318 10*3/uL (ref 140–400)
RBC: 5.16 10*6/uL — ABNORMAL HIGH (ref 3.80–5.10)
RDW: 13.2 % (ref 11.0–15.0)
Total Lymphocyte: 26.3 %
WBC: 10.2 10*3/uL (ref 3.8–10.8)

## 2021-03-02 LAB — C3 AND C4
C3 Complement: 194 mg/dL — ABNORMAL HIGH (ref 83–193)
C4 Complement: 24 mg/dL (ref 15–57)

## 2021-03-02 LAB — C-REACTIVE PROTEIN: CRP: 45.8 mg/L — ABNORMAL HIGH (ref <8.0)

## 2021-03-02 NOTE — Telephone Encounter (Signed)
Dana Mcpherson,  Can you assist in this referral ?   Thanks

## 2021-03-02 NOTE — Telephone Encounter (Signed)
Patient has been referred to:  Dr. [facility] [address] [place] [address]  [phone]  Referral and records have been faxed.  They will reach out to patient to schedule.  I did call patient and inform her of the referral and encouraged patient to call if she had not heard anything by the end of the week.

## 2021-03-02 NOTE — Telephone Encounter (Signed)
-----   Message from Althea Charon, [place] sent at 02/24/2021 12:25 PM EDT ----- Refer to dermatology ( she prefers a dermatologist in [place] if there is one, but is willing to travel if not) due to rash not responding to steroids. Possible biopsy.

## 2021-03-10 ENCOUNTER — Ambulatory Visit: Payer: 59 | Admitting: Allergy & Immunology

## 2021-03-11 ENCOUNTER — Other Ambulatory Visit: Payer: Self-pay | Admitting: Family Medicine

## 2021-03-11 ENCOUNTER — Other Ambulatory Visit: Payer: Self-pay | Admitting: Gastroenterology

## 2021-03-11 DIAGNOSIS — R1010 Upper abdominal pain, unspecified: Secondary | ICD-10-CM

## 2021-03-11 DIAGNOSIS — K219 Gastro-esophageal reflux disease without esophagitis: Secondary | ICD-10-CM

## 2021-03-24 ENCOUNTER — Ambulatory Visit: Payer: 59

## 2021-03-26 ENCOUNTER — Other Ambulatory Visit: Payer: Self-pay

## 2021-03-26 ENCOUNTER — Ambulatory Visit (INDEPENDENT_AMBULATORY_CARE_PROVIDER_SITE_OTHER): Payer: 59

## 2021-03-26 DIAGNOSIS — L501 Idiopathic urticaria: Secondary | ICD-10-CM | POA: Diagnosis not present

## 2021-04-13 ENCOUNTER — Other Ambulatory Visit: Payer: Self-pay | Admitting: Allergy & Immunology

## 2021-04-14 ENCOUNTER — Other Ambulatory Visit: Payer: Self-pay | Admitting: Cardiology

## 2021-04-14 ENCOUNTER — Telehealth: Payer: Self-pay | Admitting: Student

## 2021-04-14 NOTE — Telephone Encounter (Signed)
Sent to provider for authorization to refill.

## 2021-04-14 NOTE — Telephone Encounter (Signed)
°*  STAT* If patient is at the pharmacy, call can be transferred to refill team.   1. Which medications need to be refilled? (please list name of each medication and dose if known) CORLANOR 7.5 MG TABS tablet  2. Which pharmacy/location (including street and city if local pharmacy) is medication to be sent to? [place], [address]  3. Do they need a 30 day or 90 day supply? 90  Patient is out of the medication

## 2021-04-30 ENCOUNTER — Ambulatory Visit: Payer: 59

## 2021-05-03 ENCOUNTER — Encounter: Payer: Self-pay | Admitting: Gastroenterology

## 2021-05-05 ENCOUNTER — Ambulatory Visit (INDEPENDENT_AMBULATORY_CARE_PROVIDER_SITE_OTHER): Payer: 59 | Admitting: Allergy & Immunology

## 2021-05-05 ENCOUNTER — Other Ambulatory Visit: Payer: Self-pay

## 2021-05-05 VITALS — BP 134/88 | HR 81 | Temp 97.3°F | Resp 16 | Ht 66.5 in | Wt 252.0 lb

## 2021-05-05 DIAGNOSIS — J4520 Mild intermittent asthma, uncomplicated: Secondary | ICD-10-CM | POA: Diagnosis not present

## 2021-05-05 DIAGNOSIS — T7800XD Anaphylactic reaction due to unspecified food, subsequent encounter: Secondary | ICD-10-CM

## 2021-05-05 DIAGNOSIS — J3089 Other allergic rhinitis: Secondary | ICD-10-CM

## 2021-05-05 DIAGNOSIS — L501 Idiopathic urticaria: Secondary | ICD-10-CM | POA: Diagnosis not present

## 2021-05-05 MED ORDER — LEVALBUTEROL TARTRATE 45 MCG/ACT IN AERO: 2.0000 | INHALATION_SPRAY | RESPIRATORY_TRACT | 2 refills | Status: DC | PRN

## 2021-05-05 NOTE — Progress Notes (Signed)
FOLLOW UP  Date of Service/Encounter:  05/07/21   Assessment:   Mild intermittent asthma, uncomplicated   Chronic rhinitis   Anaphylactic shock due to food   Chronic urticaria - interested in initiating Xolair   Joint pains in hands/wrist - follows with Dr. Vernelle Emerald   Tachycardia - exacerbated with albuterol use   Full vaccinated with Moderna (NEEDS booster)   Overall, Dana seems to be doing fairly well.  She is on the Xolair which she reports is helping.  She is continuing on the suppressive doses of antihistamines.  She has not really interested in increasing her Xolair.  Initially, we thought it was not working, but now that she has had some doses that she has missed, she realizes that it has been beneficial for her.  Her asthma seems to be under good control today.  She has having some shortness of breath episodes at work with the slightest bit of physical activity, which is relieved with the use of albuterol.  We are going to try adding an ICS/LABA to see if this is of any benefit.  Plan/Recommendations:    1. Mild intermittent asthma, uncomplicated - Lung testing looked a little worse and you are having those episodes of wheezing with physical activity. - Let's add on AirDuo 113/62mcg one puff twice daily. - Stop the albuterol and start Xopenex instead 2 puffs every 4-6 hours as needed.   2. Chronic rhinitis (dust mites, cockroach) - Continue with your antihistamines.  3. 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  - In case of an allergic reaction, give Benadryl 4 teaspoonfuls every 4 hours, and if life-threatening symptoms occur, inject with EpiPen 0.3 mg.  4. Chronic urticaria with elevated ANA and elevated inflammatory markers - Continue with suppressive dosing of antihistamines: Zyrtec two tablets up to twice daily - Continue Xolair every 4 weeks. - I think we can hold off on revery 2 week Xolair for now.   5. Return in  about 2 months (around 07/03/2021).     Subjective:   Dana Mcpherson is a 31 y.o. female presenting today for follow up of  Chief Complaint  Patient presents with   Asthma    2 mth f/u - pretty good   Urticaria    2 mth f/u - good    Dana Mcpherson has a history of the following: Patient Active Problem List   Diagnosis Date Noted   Rash and other nonspecific skin eruption 03/01/2021   Encounter for sterilization    Positive ANA (antinuclear antibody) 08/31/2020   Weakness of both hands 08/31/2020   Upper back pain 08/31/2020   Mild intermittent asthma, uncomplicated 78/93/8101   Perennial allergic rhinitis 06/12/2020   Anaphylactic shock due to adverse food reaction 06/12/2020   Chronic urticaria 06/12/2020   Early satiety 03/11/2020   Abdominal pain 05/24/2019   Abdominal bloating 02/20/2019   Elevated LFTs 02/20/2019   Gastroesophageal reflux disease 02/20/2019   Dysphagia 02/20/2019   Diarrhea 02/20/2019   Atypical pneumonia 02/19/2016   Pulmonary infiltrates 02/18/2016   Hemorrhoid 06/29/2015   Hidradenitis suppurativa 06/23/2015   Shortness of breath 11/04/2014   Tachycardia 10/07/2014   ASCUS with positive high risk HPV 05/28/2014   PCOS (polycystic ovarian syndrome) 03/03/2014   Hyperinsulinemia 03/03/2014   Morbid obesity ([place]) 05/23/2013   DUB (dysfunctional uterine bleeding) 05/23/2013    History obtained from: chart review and patient.  Dana is a 31 y.o. female presenting for  a follow up visit.  He was last seen in November 2022.  At that time, we increased her Xolair to every 2 weeks.  However, she also had a different rash which we did not feel was consistent with hives.  We continue with suppressive antihistamines.  We continued her on prednisone as well as continuing with triamcinolone.  We also continue with hydroxyzine.  We put her on Allegra 2 tablets twice daily and lieu of Zyrtec 2 tablets twice daily. We did refer her to see Dr. Nevada Crane, a  dermatologist in [place].   Her asthma was under good control with albuterol as needed.  For her rhinitis, she was continued on her antihistamines.  Avoidance of seafood and egg was recommended.  She was continued on Xolair every 4 weeks for her urticaria.  Since the last visit, she has done well.   Asthma/Respiratory Symptom History: Asthma is well controlled. She has done well from the most part but she is having some issues pushing wheelchairs up ramps. She has noticed that when she gets out of her bed at night she hears a wheeze at the end of her breath.   Allergic Rhinitis Symptom History: Her allergic rhinitis symptoms are under good control with the antihistamine which she mostly takes for her urticaria.  She has not required any antibiotics.  Food Allergy Symptom History: She did break out with tuna. She did need to take Benadryl to improve the symptoms. She is having some issues with red meats.    Skin Symptom History: Rash improved. She went to see Dermatology. Rash had improved so no biopsy was recommended. It was recommended that change to [facility] Sensitive bar soap. She remains on the Xolair and it is definitely helping. She ended up missing a dose because her kiddos were sick. She was one month late getting it and she could definitely tell that her symptoms had worsened. She thinks that she is good with only getting it monthly.  She is in the special needs class and it is recommended that she wear a mask, but she is not because of the breakouts.   She did go see Dr. Benjamine Mola and her workup was normal. She continues to have inflammation but there is no reason for it that can be determined.   Otherwise, there have been no changes to her past medical history, surgical history, family history, or social history.    Review of Systems  Constitutional: Negative.  Negative for chills, fever, malaise/fatigue and weight loss.  HENT: Negative.  Negative for congestion, ear discharge, ear pain  and sinus pain.   Eyes:  Negative for pain, discharge and redness.  Respiratory:  Negative for cough, sputum production, shortness of breath and wheezing.   Cardiovascular: Negative.  Negative for chest pain and palpitations.  Gastrointestinal:  Negative for abdominal pain, diarrhea, heartburn, nausea and vomiting.  Skin: Negative.  Negative for itching and rash.  Neurological:  Negative for dizziness and headaches.  Endo/Heme/Allergies:  Negative for environmental allergies. Does not bruise/bleed easily.      Objective:   Blood pressure 134/88, pulse 81, temperature (!) 97.3 F (36.3 C), resp. rate 16, height 5' 6.5" (1.689 m), weight 252 lb (114.3 kg), SpO2 100 %. Body mass index is 40.06 kg/m.   Physical Exam:  Physical Exam Constitutional:      Appearance: She is well-developed.     Comments: Talkative.  Well-appearing.  HENT:     Head: Normocephalic and atraumatic.     Right Ear:  Tympanic membrane, ear canal and external ear normal.     Left Ear: Tympanic membrane, ear canal and external ear normal.     Nose: Mucosal edema and rhinorrhea present. No nasal deformity or septal deviation.     Right Turbinates: Enlarged, swollen and pale.     Left Turbinates: Enlarged, swollen and pale.     Right Sinus: No maxillary sinus tenderness or frontal sinus tenderness.     Left Sinus: No maxillary sinus tenderness or frontal sinus tenderness.     Mouth/Throat:     Mouth: Mucous membranes are not pale and not dry.     Pharynx: Uvula midline.  Eyes:     General: Lids are normal. No allergic shiner.       Right eye: No discharge.        Left eye: No discharge.     Conjunctiva/sclera: Conjunctivae normal.     Right eye: Right conjunctiva is not injected. No chemosis.    Left eye: Left conjunctiva is not injected. No chemosis.    Pupils: Pupils are equal, round, and reactive to light.  Cardiovascular:     Rate and Rhythm: Normal rate and regular rhythm.     Heart sounds: Normal  heart sounds.  Pulmonary:     Effort: Pulmonary effort is normal. No tachypnea, accessory muscle usage or respiratory distress.     Breath sounds: Normal breath sounds. No wheezing, rhonchi or rales.  Chest:     Chest wall: No tenderness.  Lymphadenopathy:     Cervical: No cervical adenopathy.  Skin:    General: Skin is warm.     Capillary Refill: Capillary refill takes less than 2 seconds.     Coloration: Skin is not pale.     Findings: No abrasion, erythema, petechiae or rash. Rash is not papular, urticarial or vesicular.     Comments: Excoriations over her bilateral arms.  Neurological:     Mental Status: She is alert.  Psychiatric:        Behavior: Behavior is cooperative.     Diagnostic studies:    Spirometry: results abnormal (FEV1: 2.04/60%, FVC: 2.80/69%, FEV1/FVC: 73%).    Spirometry consistent with possible restrictive disease.   Allergy Studies: none        Salvatore Marvel, MD  [facility]

## 2021-05-05 NOTE — Patient Instructions (Addendum)
1. Mild intermittent asthma, uncomplicated - Lung testing looked a little worse and you are having those episodes of wheezing with physical activity. - Let's add on AirDuo 113/32mcg one puff twice daily. - Stop the albuterol and start Xopenex instead 2 puffs every 4-6 hours as needed.   2. Chronic rhinitis (dust mites, cockroach) - Continue with your antihistamines.  3. 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  - In case of an allergic reaction, give Benadryl 4 teaspoonfuls every 4 hours, and if life-threatening symptoms occur, inject with EpiPen 0.3 mg.  4. Chronic urticaria with elevated ANA and elevated inflammatory markers - Continue with suppressive dosing of antihistamines: Zyrtec two tablets up to twice daily - Continue Xolair every 4 weeks. - I think we can hold off on revery 2 week Xolair for now.   5. Return in about 2 months (around 07/03/2021).    Please inform us of any Emergency Department visits, hospitalizations, or changes in symptoms. Call us before going to the ED for breathing or allergy symptoms since we might be able to fit you in for a sick visit. Feel free to contact us anytime with any questions, problems, or concerns.  It was a pleasure to see you again today!  Websites that have reliable patient information: 1. American Academy of Asthma, Allergy, and Immunology: www.aaaai.org 2. Food Allergy Research and Education (FARE): foodallergy.org 3. Mothers of Asthmatics: http://www.asthmacommunitynetwork.org 4. American College of Allergy, Asthma, and Immunology: www.acaai.org   COVID-19 Vaccine Information can be found at: ShippingScam.co.uk For questions related to vaccine distribution or appointments, please email [email or web address] or call [phone].   We realize that you might be concerned about having an allergic reaction to the COVID19 vaccines. To help with that  concern, WE ARE OFFERING THE COVID19 VACCINES IN OUR OFFICE! Ask the front desk for dates!     Like [place] on [employer] [place] and Instagram for our latest updates!      A healthy democracy works best when [employer] participate! Make sure you are registered to vote! If you have moved or changed any of your contact information, you will need to get this updated before voting!  In some cases, you MAY be able to register to vote online: [email or web address]

## 2021-05-07 ENCOUNTER — Encounter: Payer: Self-pay | Admitting: Allergy & Immunology

## 2021-06-01 ENCOUNTER — Encounter: Payer: Self-pay | Admitting: Orthopedic Surgery

## 2021-06-02 ENCOUNTER — Other Ambulatory Visit: Payer: Self-pay

## 2021-06-02 ENCOUNTER — Ambulatory Visit (INDEPENDENT_AMBULATORY_CARE_PROVIDER_SITE_OTHER): Payer: 59

## 2021-06-02 DIAGNOSIS — L501 Idiopathic urticaria: Secondary | ICD-10-CM

## 2021-06-07 ENCOUNTER — Ambulatory Visit: Payer: 59 | Admitting: Gastroenterology

## 2021-06-13 ENCOUNTER — Other Ambulatory Visit: Payer: Self-pay | Admitting: Student

## 2021-06-18 ENCOUNTER — Encounter: Payer: Self-pay | Admitting: Orthopedic Surgery

## 2021-06-18 ENCOUNTER — Other Ambulatory Visit: Payer: Self-pay

## 2021-06-18 ENCOUNTER — Ambulatory Visit (INDEPENDENT_AMBULATORY_CARE_PROVIDER_SITE_OTHER): Payer: 59 | Admitting: Orthopedic Surgery

## 2021-06-18 ENCOUNTER — Ambulatory Visit: Payer: Medicaid Other

## 2021-06-18 VITALS — BP 133/88 | HR 78 | Ht 66.5 in | Wt 250.6 lb

## 2021-06-18 DIAGNOSIS — G8929 Other chronic pain: Secondary | ICD-10-CM

## 2021-06-18 DIAGNOSIS — M25511 Pain in right shoulder: Secondary | ICD-10-CM | POA: Diagnosis not present

## 2021-06-18 NOTE — Progress Notes (Signed)
New Patient Visit  Assessment: Dana Mcpherson is a 31 y.o. female with the following: 1. Chronic right shoulder pain   Plan: Atraumatic onset of pain, lasting for more than a year.  She has good range of motion, with some discomfort on strength testing.  Pain primarily in the posterior aspect of the shoulder.  Mild rating pains distally.  Occasional numbness tingling into her fingers.  I think she would benefit from physical therapy at this point.  If she continues to have issues, we may have to consider evaluating the cervical spine.  Voltaren gel, as she does not tolerate oral NSAIDs.  All questions were answered, and she is amenable to this plan.   Follow-up: Return in about 2 months (around 08/16/2021).  Subjective:  Chief Complaint  Patient presents with   New Patient (Initial Visit)   Shoulder Pain    RT shld// painful for longer than a year pain is getting worse    History of Present Illness: Dana N Odenthal is a 31 y.o. female who has been referred to clinic today by Clemmie Krill, PA-C for evaluation of right shoulder pain.  She states she has had pain in the posterior superior aspect of the right shoulder for the past year.  She states it could have been longer than 1 year.  Atraumatic onset.  Pain is progressively worsening.  She has never injured her right shoulder before.  She occasionally takes Tylenol or ibuprofen, with some improvement in her symptoms.  However, she has had some GI issues.  She has tried heat and ice as well.  She has not had an injection.  She has not worked with physical therapy.   Review of Systems: No fevers or chills Occasional numbness and tingling No chest pain No shortness of breath No bowel or bladder dysfunction No GI distress No headaches   Medical History:  Past Medical History:  Diagnosis Date   Allergy    Anxiety    Asthma    Autoimmune disorder ([place])    Complication of anesthesia    woke up during surgery & ithing after  surgery   Family history of adverse reaction to anesthesia    "My mom has the same trouble with anesthesia".   GERD (gastroesophageal reflux disease)    Migraine headache    Nexplanon in place 10/05/2015   06/29/15 left arm   Polycystic ovarian syndrome    Pregnancy induced hypertension    Tachycardia    Urticaria    Vaginal Pap smear, abnormal     Past Surgical History:  Procedure Laterality Date   BIOPSY  06/19/2019   Procedure: BIOPSY;  Surgeon: Daneil Dolin, MD;  Location: [facility];  Service: Endoscopy;;   CHOLECYSTECTOMY  2010   COLONOSCOPY WITH PROPOFOL N/A 06/19/2019   Procedure: COLONOSCOPY WITH PROPOFOL;  Surgeon: Daneil Dolin, MD; 1 tubular adenoma, otherwise normal-appearing colonic mucosa, normal TI.  Stool sample for C. difficile negative.  Recommended repeat colonoscopy in 7 years.   ESOPHAGOGASTRODUODENOSCOPY     ESOPHAGOGASTRODUODENOSCOPY (EGD) WITH PROPOFOL N/A 04/08/2019   Procedure: ESOPHAGOGASTRODUODENOSCOPY (EGD) WITH PROPOFOL;  Surgeon: Daneil Dolin, MD; erosive reflux esophagitis s/p dilation, gastric mucosa congested diffusely with snakeskin appearance, antral erosions, congested eroded duodenal bulb, otherwise normal.   LAPAROSCOPIC BILATERAL SALPINGECTOMY Bilateral 10/28/2020   Procedure: LAPAROSCOPIC BILATERAL SALPINGECTOMY;  Surgeon: Florian Buff, MD;  Location: [facility];  Service: Gynecology;  Laterality: Bilateral;   MALONEY DILATION N/A 04/08/2019   Procedure: MALONEY DILATION;  Surgeon: Daneil Dolin, MD;  Location: [facility];  Service: Endoscopy;  Laterality: N/A;   POLYPECTOMY  06/19/2019   Procedure: POLYPECTOMY;  Surgeon: Daneil Dolin, MD;  [facility]: [facility];  Service: Endoscopy;;  cecal polyp    Family History  Problem Relation Age of Onset   Cancer Paternal Grandmother        ovarian cancer   Other Mother        Ovarian Tumor   Hypertension Father    Asthma Son    ADD / ADHD Son    Asthma Son    Allergies Son     Colon cancer Other        late 75's   Diabetes Sister    Healthy Brother    Social History   Tobacco Use   Smoking status: Former    Packs/day: 0.25    Years: 2.00    Pack years: 0.50    Types: Cigarettes    Quit date: 04/04/2010    Years since quitting: 11.2   Smokeless tobacco: Never  Vaping Use   Vaping Use: Never used  Substance Use Topics   Alcohol use: No   Drug use: No    Allergies  Allergen Reactions   Other Anaphylaxis and Other (See Comments)    Pt states that she is allergic to Axe/Tag body spray and Lysol.    General anesthesia - pt woke up several times, was scratching during surgery. Itching   Allergy to sun and bug bites.   Shellfish Allergy Anaphylaxis and Hives   Adhesive [Tape] Hives   Eggs Or Egg-Derived Products Itching and Nausea Only   Latex Itching    Current Meds  Medication Sig   albuterol (VENTOLIN HFA) 108 (90 Base) MCG/ACT inhaler Inhale 1-2 puffs into the lungs every 6 (six) hours as needed.   cetirizine (ZYRTEC) 10 MG tablet TAKE 2 TABLETS BY MOUTH TWICE A DAY.   CORLANOR 7.5 MG TABS tablet TAKE 1 TABLET BY MOUTH 2 TIMES DAILY WITH A MEAL.   EPINEPHrine (EPIPEN 2-PAK) 0.3 mg/0.3 mL IJ SOAJ injection Inject 0.3 mg into the muscle once as needed for anaphylaxis.   esomeprazole (NEXIUM) 40 MG capsule TALE 1 CAPSULE BY MOUTH TWICE DAILY WITH A MEAL.   Flaxseed, Linseed, (FLAXSEED OIL) 1200 MG CAPS Take 1,200 mg by mouth 2 (two) times daily.   levalbuterol (XOPENEX HFA) 45 MCG/ACT inhaler Inhale 2 puffs into the lungs every 4 (four) hours as needed for wheezing.   levonorgestrel-ethinyl estradiol (SEASONALE) 0.15-0.03 MG tablet Take 1 tablet by mouth daily.   Multiple Vitamins-Minerals (ADULT ONE DAILY GUMMIES) CHEW Chew 1 tablet by mouth in the morning and at bedtime.   promethazine (PHENERGAN) 25 MG tablet Take 1 tablet (25 mg total) by mouth every 6 (six) hours as needed for nausea or vomiting.   topiramate (TOPAMAX) 25 MG capsule Take 50  mg by mouth 2 (two) times daily.   XOLAIR 150 MG/ML prefilled syringe Inject into the skin.   Current Facility-Administered Medications for the 06/18/21 encounter (Office Visit) with Mordecai Rasmussen, MD  Medication   omalizumab Arvid Right) prefilled syringe 300 mg    Objective: BP 133/88    Pulse 78    Ht 5' 6.5" (1.689 m)    Wt 250 lb 9.6 oz (113.7 kg)    BMI 39.84 kg/m   Physical Exam:  General: Alert and oriented. and No acute distress. Gait: Normal gait.  Right shoulder without deformity.  No  atrophy is appreciated.  Tenderness to palpation of the superior aspect of the shoulder, mild tenderness to palpation within the trapezius.  She has near full range of motion, with some discomfort.  Strength is good, but she notes pain in the shoulder with strength testing.  Negative belly press.  IMAGING: I personally ordered and reviewed the following images  X-ray of the right shoulder was obtained in clinic today.  No acute injuries are noted.  Glenohumeral joint space is well-maintained.  AC joint without degenerative changes.  No evidence of proximal humeral migration.  Impression: Normal right shoulder x-ray   New Medications:  No orders of the defined types were placed in this encounter.     Mordecai Rasmussen, MD  06/18/2021 10:07 PM

## 2021-06-18 NOTE — Patient Instructions (Signed)
Voltaren gel, available over the counter   Referral for PT  Follow up in 2 months

## 2021-06-29 NOTE — Progress Notes (Signed)
Primary Care Physician:  [facility]ologist:  Garfield Cornea, MD  Chief Complaint  Patient presents with   Follow-up    States her acid reflux does act up sometimes. Also needs phenergan refilled.     HPI:  Dana Mcpherson is a 31 y.o. female here for follow-up.  Last seen in November 2021 for GERD, dysphagia, upper abdominal pain, diarrhea, rectal bleeding, elevated LFTs.  She reports doing okay. Her reflux is better on Nexium 31m BID. Still has some breakthrough symptoms on occasions. Sometimes feels nauseated, can happen with reflux but at other times too. No vomiting. May take a zofran during the day and then phenergan at night (takes 1/2 one). Occurring about once per month. Has not been able to track to anything she is eating. Cut out sodas years ago. Stays away from tomatoes. Brocolli/cauliflower upsets stomach. Breaks out in hives with most fish. After the allergy test, whole body hives, coated in cream for itching and almost required Epi. Now carries epi-pen. "They don't know what I'm actually allergic too due to the allergy test reacting so strongly. Then they did blood test on to foods I know I have problem with". Now on allergy shots. Has been helping with hives/rash. BMs are ok. Intermittent diarrhea chronically. Just lives with it. Bentyl caused constipation and did not help with abdominal cramping. No melena, brbpr.   Saw Rheumatologist for positive ANA + and elevated CRP. Per patient advised they did not see autoimmune process going on after further labs. She continues to have intermittent swelling of her hands and upper stomach. No LE edema. States she wants to lose weight but due to her heart problems and PCOS she feels like she can't. She does not tolerate exercise due to tachycardia. She tried low carb diet but she states her heart doctor did not want her on it due to meat/fat content. She is going to see her PCP to discuss weight loss options. She  is not sure why her CRP remains elevated. She said she is at risk of frequent UTIs due to abnormal urethra. Previously advised by urology to have chronic antibiotics but does not have problems now. Rarely has bad UTI symptoms. Also has hidradenitis suppurativa. Sees GYN every 12 months due to family history of ovarian cancer.     Previous work up:  EGD 04/08/19 with erosive reflux esophagitis s/p dilation, stomach with abnormal congestion diffusely with snakeskin appearance, antral erosions, patent pylorus, congested eroded duodenal bulb, otherwise normal.    No specimens collected.    Colonoscopy February 2021 with normal appearing TI, one tubular adenoma in cecum, segmental biopsies benign, stool sample submitted for C. Diff negative. Suspect rectal bleeding was trivial anorectal bleeding.  Due for repeat colonoscopy in 2028.   LFTs had been mildly elevated in 2010, normalized in 2012, but ALT became mildly elevated in July 2020 with persistent elevation. Extensive serologic evaluation has been completed including negative hepatitis A, B, and C, immune to Hep B, no immunity to hepatitis A, iron panel WNL, PT/INR normal, immunoglobulins, ANA, AMA, ASMA within normal limits.  No evidence of alpha-1 antitrypsin deficiency.  Ceruloplasmin elevated.  Follow-up 24-hour urine copper within normal limits. Abdominal ultrasound November 2020 unremarkable.  Prior ultrasound in January 2019 suggestive of hepatic steatosis. Last labs in 10/2020 with ALT at 42 normal and  AST and alk phos wnl.   Celiac screen was negative.  H. pylori serologies negative.  CRP elevated at 43.5, sed rate elevated at  53, alpha gal panel negative. Patient never had fecal calprotectin or H. pylori testing completed. CRP chronically elevated over the past year, most recently 45.8.  Gastric emptying study December 2021: Normal  CT abdomen pelvis with contrast January 2021: Status postcholecystectomy, no acute findings.  Has been seen  by Dr. Ernst Bowler, positive ANA, normal serum tryptase, shellfish panel shows slightly elevated to crab and shrimp, advised to avoid if she has reacted in the past but could be false positives.  Environmental allergy to dust mites and cockroach.  Negative alpha gal.  Current Outpatient Medications  Medication Sig Dispense Refill   albuterol (VENTOLIN HFA) 108 (90 Base) MCG/ACT inhaler Inhale 1-2 puffs into the lungs every 6 (six) hours as needed. 18 g 2   cetirizine (ZYRTEC) 10 MG tablet TAKE 2 TABLETS BY MOUTH TWICE A DAY. 120 tablet 5   CORLANOR 7.5 MG TABS tablet TAKE 1 TABLET BY MOUTH 2 TIMES DAILY WITH A MEAL. 60 tablet 0   EPINEPHrine (EPIPEN 2-PAK) 0.3 mg/0.3 mL IJ SOAJ injection Inject 0.3 mg into the muscle once as needed for anaphylaxis. 2 each 1   esomeprazole (NEXIUM) 40 MG capsule TALE 1 CAPSULE BY MOUTH TWICE DAILY WITH A MEAL. 60 capsule 2   Flaxseed, Linseed, (FLAXSEED OIL) 1200 MG CAPS Take 1,200 mg by mouth 2 (two) times daily.     levalbuterol (XOPENEX HFA) 45 MCG/ACT inhaler Inhale 2 puffs into the lungs every 4 (four) hours as needed for wheezing. 1 each 2   levonorgestrel-ethinyl estradiol (SEASONALE) 0.15-0.03 MG tablet Take 1 tablet by mouth daily. 91 tablet 3   promethazine (PHENERGAN) 25 MG tablet Take 1 tablet (25 mg total) by mouth every 6 (six) hours as needed for nausea or vomiting. 20 tablet 0   topiramate (TOPAMAX) 25 MG capsule Take 50 mg by mouth 2 (two) times daily.     XOLAIR 150 MG/ML prefilled syringe Inject into the skin.     No current facility-administered medications for this visit.    Allergies as of 06/30/2021 - Review Complete 06/30/2021  Allergen Reaction Noted   Other Anaphylaxis and Other (See Comments) 08/30/2012   Shellfish allergy Anaphylaxis and Hives 05/20/2013   Adhesive [tape] Hives 10/10/2014   Eggs or egg-derived products Itching and Nausea Only 07/05/2012   Latex Itching 11/04/2014     ROS:  General: Negative for anorexia, weight  loss, fever, chills, fatigue, weakness. Eyes: Negative for vision changes.  ENT: Negative for hoarseness, difficulty swallowing , nasal congestion. CV: Negative for chest pain, angina, palpitations, dyspnea on exertion, peripheral edema.  Respiratory: Negative for dyspnea at rest, dyspnea on exertion, cough, sputum, wheezing.  GI: See history of present illness. GU:  Negative for dysuria, hematuria, urinary incontinence, urinary frequency, nocturnal urination.  MS: Negative for joint pain, low back pain.  Derm: Negative for rash or itching. Intermittent hives Neuro: Negative for weakness, abnormal sensation, seizure, frequent headaches, memory loss, confusion.  Psych: Negative for anxiety, depression, suicidal ideation, hallucinations.  Endo: Negative for unusual weight change.  Heme: Negative for bruising or bleeding. Allergy: Negative for rash or hives.    Physical Examination:  BP (!) 142/70 (BP Location: Right Arm, Patient Position: Sitting, Cuff Size: Large)    Pulse 65    Temp (!) 97.3 F (36.3 C) (Temporal)    Ht 5' 6.5" (1.689 m)    Wt 251 lb 3.2 oz (113.9 kg)    SpO2 97%    BMI 39.94 kg/m    General: Well-nourished, well-developed  in no acute distress. Obese, mostly carries in upper half. Head: Normocephalic, atraumatic.   Eyes: Conjunctiva pink, no icterus. Mouth: masked Neck: Supple without thyromegaly, masses, or lymphadenopathy.  Lungs: Clear to auscultation bilaterally.  Heart: Regular rate and rhythm, no murmurs rubs or gallops.  Abdomen: Bowel sounds are normal,   nondistended, no hepatosplenomegaly or masses, no abdominal bruits or    hernia , no rebound or guarding.  Mild epig tenderness Rectal: not performed Extremities: No lower extremity edema. No clubbing or deformities.  Neuro: Alert and oriented x 4 , grossly normal neurologically.  Skin: Warm and dry, no rash or jaundice.   Psych: Alert and cooperative, normal mood and affect.  Labs: Lab Results   Component Value Date   CREATININE 0.72 10/27/2020   BUN 7 10/27/2020   NA 138 10/27/2020   K 3.8 10/27/2020   CL 110 10/27/2020   CO2 19 (L) 10/27/2020   Lab Results  Component Value Date   ALT 42 10/27/2020   AST 29 10/27/2020   ALKPHOS 62 10/27/2020   BILITOT 0.4 10/27/2020   Lab Results  Component Value Date   WBC 10.2 03/01/2021   HGB 14.2 03/01/2021   HCT 44.0 03/01/2021   MCV 85.3 03/01/2021   PLT 318 03/01/2021   Lab Results  Component Value Date   CRP 45.8 (H) 03/01/2021     Imaging Studies: DG Shoulder Right  Result Date: 06/23/2021 X-ray of the right shoulder was obtained in clinic today.  No acute injuries are noted.  Glenohumeral joint space is well-maintained.  AC joint without degenerative changes.  No evidence of proximal humeral migration.   Impression: Normal right shoulder x-ray    Assessment:  GERD/mild epigastric tenderness: controlled most of the time, currently on Nexium 4m BID. About once per month she has significant flare often associated with nausea, requires antiemetic. She prefers to have medication on hand in case. Reinforced antireflux measures. Weight loss would go a long way in helping her manage her reflux disease. She feels defeated due to her inability to exercise with her tachycardic issues. Educated her on ability to manage weight with dietary intake alone. She feels like she has no direction on what she should be eating. Tried low carb diet but cardiology advised against due to typical high fat content. She would benefit from nutrition assistance or from weight loss center.   Abnormal LFTs: work up as outlined. Likely fatty liver. Last LFTs in 10/2020 were normal. Should have periodic check at least once per year. She has upcoming labs with PCP, recommend having LFTs done at that time.    Plan:  Continue Nexium 471mtwice daily before a meal.  Consider [facility] for weight loss. Discuss with PCP or let me know if  you want referral. Use phenergan sparingly for nausea.  Continue to have LFTs checked once per year at least.  She anticipates labs with PCP in the near future.  If she does not have them done would like me to order we will be glad to do that. Consider follow-up with PCP for elevated CRP. No GI source identified. Return to the office in 1 year or call sooner if needed.

## 2021-06-30 ENCOUNTER — Ambulatory Visit: Payer: 59

## 2021-06-30 ENCOUNTER — Ambulatory Visit (INDEPENDENT_AMBULATORY_CARE_PROVIDER_SITE_OTHER): Payer: 59 | Admitting: Gastroenterology

## 2021-06-30 ENCOUNTER — Encounter: Payer: Self-pay | Admitting: Gastroenterology

## 2021-06-30 ENCOUNTER — Other Ambulatory Visit: Payer: Self-pay

## 2021-06-30 VITALS — BP 142/70 | HR 65 | Temp 97.3°F | Ht 66.5 in | Wt 251.2 lb

## 2021-06-30 DIAGNOSIS — R1010 Upper abdominal pain, unspecified: Secondary | ICD-10-CM | POA: Diagnosis not present

## 2021-06-30 DIAGNOSIS — R7989 Other specified abnormal findings of blood chemistry: Secondary | ICD-10-CM | POA: Diagnosis not present

## 2021-06-30 DIAGNOSIS — K2100 Gastro-esophageal reflux disease with esophagitis, without bleeding: Secondary | ICD-10-CM | POA: Diagnosis not present

## 2021-06-30 DIAGNOSIS — K219 Gastro-esophageal reflux disease without esophagitis: Secondary | ICD-10-CM

## 2021-06-30 MED ORDER — PROMETHAZINE HCL 25 MG PO TABS: ORAL_TABLET | ORAL | 0 refills | Status: DC

## 2021-06-30 MED ORDER — ESOMEPRAZOLE MAGNESIUM 40 MG PO CPDR: 40.0000 mg | DELAYED_RELEASE_CAPSULE | Freq: Two times a day (BID) | ORAL | 11 refills | Status: DC

## 2021-06-30 NOTE — Patient Instructions (Addendum)
Continue Nexium '40mg'$  twice daily before breakfast and evening meal.  ?Use phenergan 1/2 to 1 tablet every 6 hours as needed for nausea. Use sparingly.  ?Follow up with PCP regarding desire for help with weight loss. [facility] in [place] may be a good option for you.  ?Continue to have your liver labs monitored at least once per year. If you do not have labs with your PCP in upcoming future, please let me know and we can update them. ?Follow up with PCP regarding elevated C-reactive protein (inflammatory marker).  ?Return to the office in one year or call sooner if needed.  ?

## 2021-07-05 ENCOUNTER — Ambulatory Visit (HOSPITAL_COMMUNITY): Payer: 59 | Attending: Orthopedic Surgery | Admitting: Occupational Therapy

## 2021-07-05 DIAGNOSIS — R29898 Other symptoms and signs involving the musculoskeletal system: Secondary | ICD-10-CM | POA: Insufficient documentation

## 2021-07-05 DIAGNOSIS — M25511 Pain in right shoulder: Secondary | ICD-10-CM | POA: Insufficient documentation

## 2021-07-05 DIAGNOSIS — G8929 Other chronic pain: Secondary | ICD-10-CM | POA: Insufficient documentation

## 2021-07-14 ENCOUNTER — Other Ambulatory Visit: Payer: Self-pay

## 2021-07-14 ENCOUNTER — Ambulatory Visit (HOSPITAL_COMMUNITY): Payer: 59 | Admitting: Occupational Therapy

## 2021-07-14 ENCOUNTER — Other Ambulatory Visit: Payer: Self-pay | Admitting: Student

## 2021-07-14 DIAGNOSIS — M25511 Pain in right shoulder: Secondary | ICD-10-CM | POA: Diagnosis present

## 2021-07-14 DIAGNOSIS — G8929 Other chronic pain: Secondary | ICD-10-CM | POA: Diagnosis present

## 2021-07-14 DIAGNOSIS — R29898 Other symptoms and signs involving the musculoskeletal system: Secondary | ICD-10-CM

## 2021-07-14 NOTE — Patient Instructions (Signed)
?  1) Flexion Wall Stretch    Face wall, place affected handon wall in front of you. Slide hand up the wall  and lean body in towards the wall. Hold for 10 seconds. Repeat 3-5 times. 1-2 times/day.     2) Towel Stretch with Internal Rotation      Gently pull up (or to the side) your affected arm  behind your back with the assist of a towel. Hold 10 seconds, repeat 3-5 times. 1-2 times/day.             3) Corner Stretch    Stand at a corner of a wall, place your arms on the walls with elbows bent. Lean into the corner until a stretch is felt along the front of your chest and/or shoulders. Hold for 10 seconds. Repeat 3-5X, 1-2 times/day.    4) Posterior Capsule Stretch    Bring the involved arm across chest. Grasp elbow and pull toward chest until you feel a stretch in the back of the upper arm and shoulder. Hold 10 seconds. Repeat 3-5X. Complete 1-2 times/day.     5) External Rotation Stretch:     Place your affected hand on the wall with the elbow bent and gently turn your body the opposite direction until a stretch is felt. Hold 10 seconds, repeat 3-5X. Complete 1-2 times/day.      

## 2021-07-14 NOTE — Therapy (Signed)
?OUTPATIENT OCCUPATIONAL THERAPY ORTHO EVALUATION ? ?Patient Name: Dana Mcpherson ?MRN: [id number] ?DOB:08-12-1990, 31 y.o., female ?Today's Date: 07/14/2021 ? ?PCP: Curlene Labrum, MD ?REFERRING PROVIDER: Mordecai Rasmussen, MD ? ? OT End of Session - 07/14/21 1200   ? ? Visit Number 1   ? Number of Visits 4   ? Date for OT Re-Evaluation 09/12/21 (auth date extended past 4 weeks due to difficulty scheduling appts)  ? Authorization Type 1) [employer], $30 copay 2) [place] Medicaid   ? Authorization Time Period Medicaid: no auth required for pt's over 2 years old; 6 visits per year   ? Authorization - Visit Number 0   ? Authorization - Number of Visits 27   ? OT Start Time 0901   ? OT Stop Time 1275   ? OT Time Calculation (min) 34 min   ? Activity Tolerance Patient tolerated treatment well   ? Behavior During Therapy [facility] for tasks assessed/performed   ? ?  ?  ? ?  ? ? ?Past Medical History:  ?Diagnosis Date  ? Allergy   ? Anxiety   ? Asthma   ? Autoimmune disorder ([place])   ? Complication of anesthesia   ? woke up during surgery & ithing after surgery  ? Family history of adverse reaction to anesthesia   ? "My mom has the same trouble with anesthesia".  ? GERD (gastroesophageal reflux disease)   ? Migraine headache   ? Nexplanon in place 10/05/2015  ? 06/29/15 left arm  ? Polycystic ovarian syndrome   ? Pregnancy induced hypertension   ? Tachycardia   ? Urticaria   ? Vaginal Pap smear, abnormal   ? ?Past Surgical History:  ?Procedure Laterality Date  ? BIOPSY  06/19/2019  ? Procedure: BIOPSY;  Surgeon: Daneil Dolin, MD;  [facility]: [facility];  Service: Endoscopy;;  ? CHOLECYSTECTOMY  2010  ? COLONOSCOPY WITH PROPOFOL N/A 06/19/2019  ? Procedure: COLONOSCOPY WITH PROPOFOL;  Surgeon: Daneil Dolin, MD; 1 tubular adenoma, otherwise normal-appearing colonic mucosa, normal TI.  Stool sample for C. difficile negative.  Recommended repeat colonoscopy in 7 years.  ? ESOPHAGOGASTRODUODENOSCOPY    ? ESOPHAGOGASTRODUODENOSCOPY  (EGD) WITH PROPOFOL N/A 04/08/2019  ? Procedure: ESOPHAGOGASTRODUODENOSCOPY (EGD) WITH PROPOFOL;  Surgeon: Daneil Dolin, MD; erosive reflux esophagitis s/p dilation, gastric mucosa congested diffusely with snakeskin appearance, antral erosions, congested eroded duodenal bulb, otherwise normal.  ? LAPAROSCOPIC BILATERAL SALPINGECTOMY Bilateral 10/28/2020  ? Procedure: LAPAROSCOPIC BILATERAL SALPINGECTOMY;  Surgeon: Florian Buff, MD;  Location: [facility];  Service: Gynecology;  Laterality: Bilateral;  ? MALONEY DILATION N/A 04/08/2019  ? Procedure: MALONEY DILATION;  Surgeon: Daneil Dolin, MD;  Location: [facility];  Service: Endoscopy;  Laterality: N/A;  ? POLYPECTOMY  06/19/2019  ? Procedure: POLYPECTOMY;  Surgeon: Daneil Dolin, MD;  [facility]: [facility];  Service: Endoscopy;;  cecal polyp  ? ?Patient Active Problem List  ? Diagnosis Date Noted  ? Rash and other nonspecific skin eruption 03/01/2021  ? Encounter for sterilization   ? Positive ANA (antinuclear antibody) 08/31/2020  ? Weakness of both hands 08/31/2020  ? Upper back pain 08/31/2020  ? Mild intermittent asthma, uncomplicated 17/00/1749  ? Perennial allergic rhinitis 06/12/2020  ? Anaphylactic shock due to adverse food reaction 06/12/2020  ? Chronic urticaria 06/12/2020  ? Early satiety 03/11/2020  ? Abdominal pain 05/24/2019  ? Abdominal bloating 02/20/2019  ? Elevated LFTs 02/20/2019  ? Gastroesophageal reflux disease 02/20/2019  ? Dysphagia 02/20/2019  ?  Diarrhea 02/20/2019  ? Atypical pneumonia 02/19/2016  ? Pulmonary infiltrates 02/18/2016  ? Hemorrhoid 06/29/2015  ? Hidradenitis suppurativa 06/23/2015  ? Shortness of breath 11/04/2014  ? Tachycardia 10/07/2014  ? ASCUS with positive high risk HPV 05/28/2014  ? PCOS (polycystic ovarian syndrome) 03/03/2014  ? Hyperinsulinemia 03/03/2014  ? Morbid obesity ([place]) 05/23/2013  ? DUB (dysfunctional uterine bleeding) 05/23/2013  ? ? ?ONSET DATE: 04/2020 ? ?REFERRING DIAG: right shoulder  pain ? ?THERAPY DIAG:  ?Chronic right shoulder pain ? ?Other symptoms and signs involving the musculoskeletal system ? ?SUBJECTIVE:  ? ?SUBJECTIVE STATEMENT: ?S: They said the x-ray looked beautiful.  ?Pt accompanied by: self ? ?PERTINENT HISTORY: Pt is a 31 y/o female who has had right shoulder pain for over one year. X-ray was completed with no abnormalities. Pt also with potential cervical neck issues as she is dropping items and has poor grip strength, sensation changes.  ? ?PRECAUTIONS: None ? ?WEIGHT BEARING RESTRICTIONS No ? ?PAIN:  ?Are you having pain? Yes: NPRS scale: 4/10 ?Pain location: right shoulder ?Pain description: aching, throbbing ?Aggravating factors: lifting items ?Relieving factors: heat, ice ? ?FALLS: Has patient fallen in last 6 months? No, Number of falls: 0 ? ? ?PLOF: Independent ? ?PATIENT GOALS To have less pain and be able to use her arm more.  ? ?OBJECTIVE:  ? ?HAND DOMINANCE: Right ? ?ADLs: ?Overall ADLs: Pt is having difficulty with opening jars, reaching back and overhead are difficult, dressing, bathing, putting shoes on, peri-hygiene. 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  ? ? ?FUNCTIONAL OUTCOME MEASURES: ?FOTO: 65/100 ? ?UE ROM    ? ?Active ROM Right ?07/14/2021 Left ?07/14/2021  ?Shoulder flexion 156   ?Shoulder abduction 140   ?Shoulder internal rotation 90   ?Shoulder external rotation 68   ?(Blank rows = not tested) ? ? ? ?UE MMT:    ? ?MMT Right ?07/14/2021 Left ?07/14/2021  ?Shoulder flexion 4/5   ?Shoulder abduction 5/5   ?Shoulder internal rotation 4/5   ?Shoulder external rotation 4/5   ?(Blank rows = not tested) ? ?HAND FUNCTION: ?Grip strength: Right: 28 lbs; Left: 40 lbs ? ? ?EDEMA: Pt reports chronic edema in bilateral hands-going on for years ? ?COGNITION: ?Overall cognitive status: Within functional limits for tasks assessed ? ? ? ?PATIENT EDUCATION: ?Education details:  shoulder stretches ?Person educated: Patient ?Education method: Explanation, Demonstration, and Handouts ?Education comprehension: verbalized understanding and returned demonstration ? ? ?HOME EXERCISE PROGRAM: ?3/22: shoulder stretches ? ?GOALS: ?Goals reviewed with patient? Yes ? ?SHORT TERM GOALS: Target date: 08/11/2021 ? ?Pt will decrease pain in RUE to 3/10 or less to improve ability to reach behind back for dressing and peri-care.  ?Goal status: INITIAL ? ?2.  Pt will be provided with and educated on HEP to improve mobility of RUE required for use as dominant during ADL completion.  ? ?Goal status: INITIAL ? ?3.  Pt will decrease RUE fascial restrictions to min amounts to improve mobility required for functional reaching tasks.  ? ?Goal status: INITIAL ? ?4.  Pt will increase RUE A/ROM to [facility] to improve ability to reach overhead and behind back when bathing and dressing.  ? ?Goal status: INITIAL ? ?5.  Pt will increase RUE strength to 4+/5 or greater to improve ability to perform lifting tasks at work.  ? ?Goal status: INITIAL ? ?6.  Pt will increase right grip strength by 10# or  greater to improve ability to maintain her hold on items and to open jars using the right hand.  ? ?Goal status: INITIAL ? ? ? ?ASSESSMENT: ? ?CLINICAL IMPRESSION: ?Patient is a 31 y.o. female who was seen today for occupational therapy evaluation for right shoulder pain. Pt reports x-rays are normal, has not had an MRI, pain has been ongoing for approximately 1 year. Pt with pain limiting participation in ADLs and work tasks, has pain with lifting tasks such as those required at work.   ? ?PERFORMANCE DEFICITS in functional skills including ADLs, IADLs, ROM, strength, pain, fascial restrictions, muscle spasms, endurance, and UE functional use ? ?IMPAIRMENTS are limiting patient from ADLs, IADLs, rest and sleep, work, and leisure.  ? ?COMORBIDITIES may have co-morbidities  that affects occupational performance. Patient will benefit  from skilled OT to address above impairments and improve overall function. ? ?MODIFICATION OR ASSISTANCE TO COMPLETE EVALUATION: No modification of tasks or assist necessary to complete an evaluation. ? ?OT OCCU

## 2021-07-16 ENCOUNTER — Ambulatory Visit: Payer: 59 | Admitting: Allergy & Immunology

## 2021-07-16 ENCOUNTER — Ambulatory Visit: Payer: 59

## 2021-07-22 ENCOUNTER — Ambulatory Visit (HOSPITAL_COMMUNITY): Payer: 59

## 2021-07-30 ENCOUNTER — Encounter (HOSPITAL_COMMUNITY): Payer: Self-pay | Admitting: Occupational Therapy

## 2021-07-30 ENCOUNTER — Ambulatory Visit (HOSPITAL_COMMUNITY): Payer: 59 | Attending: Orthopedic Surgery | Admitting: Occupational Therapy

## 2021-07-30 DIAGNOSIS — R29898 Other symptoms and signs involving the musculoskeletal system: Secondary | ICD-10-CM | POA: Diagnosis present

## 2021-07-30 DIAGNOSIS — M25511 Pain in right shoulder: Secondary | ICD-10-CM | POA: Diagnosis present

## 2021-07-30 DIAGNOSIS — G8929 Other chronic pain: Secondary | ICD-10-CM | POA: Insufficient documentation

## 2021-07-30 NOTE — Therapy (Signed)
?OUTPATIENT OCCUPATIONAL THERAPY TREATMENT NOTE ? ? ?Patient Name: Dana Mcpherson ?MRN: [id number] ?DOB:03/29/1991, 31 y.o., female ?Today's Date: 07/30/2021 ? ?PCP: Curlene Labrum, MD ?REFERRING PROVIDER: Dr. Larena Glassman ? ?END OF SESSION:  ? OT End of Session - 07/30/21 1510   ? ? Visit Number 2   ? Number of Visits 4   ? Date for OT Re-Evaluation 09/12/21   ? Authorization Type 1) [employer], $30 copay 2) [place] Medicaid   ? Authorization Time Period Medicaid: no auth required for pt's over 63 years old; 5 visits per year   ? Authorization - Visit Number 1   ? Authorization - Number of Visits 27   ? OT Start Time 1430   ? OT Stop Time 1513   ? OT Time Calculation (min) 43 min   ? Activity Tolerance Patient tolerated treatment well   ? Behavior During Therapy [facility] for tasks assessed/performed   ? ?  ?  ? ?  ? ? ?Past Medical History:  ?Diagnosis Date  ? Allergy   ? Anxiety   ? Asthma   ? Autoimmune disorder ([place])   ? Complication of anesthesia   ? woke up during surgery & ithing after surgery  ? Family history of adverse reaction to anesthesia   ? "My mom has the same trouble with anesthesia".  ? GERD (gastroesophageal reflux disease)   ? Migraine headache   ? Nexplanon in place 10/05/2015  ? 06/29/15 left arm  ? Polycystic ovarian syndrome   ? Pregnancy induced hypertension   ? Tachycardia   ? Urticaria   ? Vaginal Pap smear, abnormal   ? ?Past Surgical History:  ?Procedure Laterality Date  ? BIOPSY  06/19/2019  ? Procedure: BIOPSY;  Surgeon: Daneil Dolin, MD;  [facility];  Service: Endoscopy;;  ? CHOLECYSTECTOMY  2010  ? COLONOSCOPY WITH PROPOFOL N/A 06/19/2019  ? Procedure: COLONOSCOPY WITH PROPOFOL;  Surgeon: Daneil Dolin, MD; 1 tubular adenoma, otherwise normal-appearing colonic mucosa, normal TI.  Stool sample for C. difficile negative.  Recommended repeat colonoscopy in 7 years.  ? ESOPHAGOGASTRODUODENOSCOPY    ? ESOPHAGOGASTRODUODENOSCOPY (EGD) WITH PROPOFOL N/A 04/08/2019  ? Procedure:  ESOPHAGOGASTRODUODENOSCOPY (EGD) WITH PROPOFOL;  Surgeon: Daneil Dolin, MD; erosive reflux esophagitis s/p dilation, gastric mucosa congested diffusely with snakeskin appearance, antral erosions, congested eroded duodenal bulb, otherwise normal.  ? LAPAROSCOPIC BILATERAL SALPINGECTOMY Bilateral 10/28/2020  ? Procedure: LAPAROSCOPIC BILATERAL SALPINGECTOMY;  Surgeon: Florian Buff, MD;  Location: [facility];  Service: Gynecology;  Laterality: Bilateral;  ? MALONEY DILATION N/A 04/08/2019  ? Procedure: MALONEY DILATION;  Surgeon: Daneil Dolin, MD;  Location[facility];  Service: Endoscopy;  Laterality: N/A;  ? POLYPECTOMY  06/19/2019  ? Procedure: POLYPECTOMY;  Surgeon: Daneil Dolin, MD;  Location[facility];  Service: Endoscopy;;  cecal polyp  ? ?Patient Active Problem List  ? Diagnosis Date Noted  ? Rash and other nonspecific skin eruption 03/01/2021  ? Encounter for sterilization   ? Positive ANA (antinuclear antibody) 08/31/2020  ? Weakness of both hands 08/31/2020  ? Upper back pain 08/31/2020  ? Mild intermittent asthma, uncomplicated 36/14/4315  ? Perennial allergic rhinitis 06/12/2020  ? Anaphylactic shock due to adverse food reaction 06/12/2020  ? Chronic urticaria 06/12/2020  ? Early satiety 03/11/2020  ? Abdominal pain 05/24/2019  ? Abdominal bloating 02/20/2019  ? Elevated LFTs 02/20/2019  ? Gastroesophageal reflux disease 02/20/2019  ? Dysphagia 02/20/2019  ? Diarrhea 02/20/2019  ?  Atypical pneumonia 02/19/2016  ? Pulmonary infiltrates 02/18/2016  ? Hemorrhoid 06/29/2015  ? Hidradenitis suppurativa 06/23/2015  ? Shortness of breath 11/04/2014  ? Tachycardia 10/07/2014  ? ASCUS with positive high risk HPV 05/28/2014  ? PCOS (polycystic ovarian syndrome) 03/03/2014  ? Hyperinsulinemia 03/03/2014  ? Morbid obesity ([place]) 05/23/2013  ? DUB (dysfunctional uterine bleeding) 05/23/2013  ? ? ?ONSET DATE: 04/2020 ? ?REFERRING DIAG: right shoulder pain ? ?THERAPY DIAG:  ?Chronic right shoulder  pain ? ?Other symptoms and signs involving the musculoskeletal system ? ? ?PERTINENT HISTORY: Pt is a 32 y/o female who has had right shoulder pain for over one year. X-ray was completed with no abnormalities. Pt also with potential cervical neck issues as she is dropping items and has poor grip strength, sensation changes.  ? ?PRECAUTIONS: None ? ?SUBJECTIVE: S: When I start doing things with it it gets painful.  ? ?PAIN:  ?Are you having pain? Yes: NPRS scale: 5/10 ?Pain location: right shoulder ?Pain description: aching, sore ?Aggravating factors: increased use ?Relieving factors: rest ? ? ? ? ?OBJECTIVE:  ? ?UE ROM    ?  ?Active ROM Right ?07/14/2021 Left ?07/14/2021  ?Shoulder flexion 156    ?Shoulder abduction 140    ?Shoulder internal rotation 90    ?Shoulder external rotation 68    ?(Blank rows = not tested) ?  ?  ?  ?UE MMT:    ?  ?MMT Right ?07/14/2021 Left ?07/14/2021  ?Shoulder flexion 4/5    ?Shoulder abduction 5/5    ?Shoulder internal rotation 4/5    ?Shoulder external rotation 4/5    ?(Blank rows = not tested) ?  ?HAND FUNCTION: ?Grip strength: Right: 28 lbs; Left: 40 lbs ? ? ?TODAY'S TREATMENT: ?07/30/2021 ?-Myofascial release: completed separately from therapeutic exercises. Myofascial release to right upper arm, anterior shoulder, and trapezius regions to decrease pain and fascial restrictions, increase joint ROM.  ?-Shoulder strengthening: 2#, protraction, flexion, er/IR, horizontal abduction, abduction, 10X each ?-Theraband strengthening: blue band, flexion, protraction, abduction, horizontal abduction, er/IR, 10X each ?-Ball on wall: 1' flexion, 1' abduction, green ball ?  -UBE: level 2, 3' forward 3' reverse, pace: 6.5 ? ? ?PATIENT EDUCATION: ?Education details: blue theraband strengthening ?Person educated: Patient ?Education method: Explanation, Demonstration, and Handouts ?Education comprehension: verbalized understanding and returned demonstration ?  ?  ?HOME EXERCISE PROGRAM: ?3/22: shoulder  stretches; 4/7: blue theraband strengthening ? ? ?SHORT TERM GOALS: Target date: 08/11/2021 ?  ?Pt will decrease pain in RUE to 3/10 or less to improve ability to reach behind back for dressing and peri-care.  ?Goal status: Ongoing ?  ?2.  Pt will be provided with and educated on HEP to improve mobility of RUE required for use as dominant during ADL completion.  ?  ?Goal status: Ongoing ?  ?3.  Pt will decrease RUE fascial restrictions to min amounts to improve mobility required for functional reaching tasks.  ?  ?Goal status: Ongoing ?  ?4.  Pt will increase RUE A/ROM to [facility] to improve ability to reach overhead and behind back when bathing and dressing.  ?  ?Goal status: Ongoing ?  ?5.  Pt will increase RUE strength to 4+/5 or greater to improve ability to perform lifting tasks at work.  ?  ?Goal status: Ongoing ?  ?6.  Pt will increase right grip strength by 10# or greater to improve ability to maintain her hold on items and to open jars using the right hand.  ?  ?Goal status: Ongoing ? ? ? ? ? ?   ?  OT Assessment and Plan  ?Clinical Impression Statement A: Pt reports she did her HEP but then got sick and has not completed since. Continues to have pain with movement, resolves with rest. Initiated myofascial release to address fascial restrictions. No P/ROM needed as pt demonstrates ROM WNL. Pt completing RUE strengtheing in supine using 2# weight, then transitioned to resistance exercises completing blue theraband strengthening. Also added ball on wall and UBE. Verbal cuing for form and technique.  ?Occupational performance deficits (Please refer to evaluation for details): ADL's;IADL's;Work;Leisure  ?Pt will benefit from skilled therapeutic intervention in order to improve on the following performance deficits Body Structure / Function / Physical Skills  ?Body Structure / Function / Physical Skills ADL;Endurance;Muscle spasms;UE functional use;Fascial restriction;Pain;ROM;IADL;Strength  ?Plan P: Continue with  shoulder and scapular strengthening and stability, add x to v arms, overhead lacing  ? ? ?Guadelupe Sabin, OTR/L  ?[phone] ?07/30/2021, 3:13 PM ? ?  ? ?  ?

## 2021-07-30 NOTE — Patient Instructions (Signed)

## 2021-08-02 ENCOUNTER — Encounter: Payer: Self-pay | Admitting: Family Medicine

## 2021-08-02 ENCOUNTER — Ambulatory Visit: Payer: 59 | Admitting: Family Medicine

## 2021-08-02 ENCOUNTER — Ambulatory Visit (INDEPENDENT_AMBULATORY_CARE_PROVIDER_SITE_OTHER): Payer: 59

## 2021-08-02 VITALS — BP 100/80 | HR 75 | Temp 98.5°F | Resp 16 | Ht 66.5 in | Wt 247.8 lb

## 2021-08-02 DIAGNOSIS — J3089 Other allergic rhinitis: Secondary | ICD-10-CM | POA: Diagnosis not present

## 2021-08-02 DIAGNOSIS — J4520 Mild intermittent asthma, uncomplicated: Secondary | ICD-10-CM | POA: Diagnosis not present

## 2021-08-02 DIAGNOSIS — B999 Unspecified infectious disease: Secondary | ICD-10-CM | POA: Diagnosis not present

## 2021-08-02 DIAGNOSIS — L501 Idiopathic urticaria: Secondary | ICD-10-CM | POA: Diagnosis not present

## 2021-08-02 DIAGNOSIS — T7800XD Anaphylactic reaction due to unspecified food, subsequent encounter: Secondary | ICD-10-CM

## 2021-08-02 MED ORDER — MONTELUKAST SODIUM 10 MG PO TABS: 10.0000 mg | ORAL_TABLET | Freq: Every day | ORAL | 5 refills | Status: DC

## 2021-08-02 MED ORDER — OMALIZUMAB 150 MG/ML ~~LOC~~ SOSY
300.0000 mg | PREFILLED_SYRINGE | SUBCUTANEOUS | Status: DC
  Administered 2021-08-02 – 2022-01-31 (×6): 300 mg via SUBCUTANEOUS

## 2021-08-02 MED ORDER — FLUTICASONE PROPIONATE HFA 110 MCG/ACT IN AERO: 2.0000 | INHALATION_SPRAY | Freq: Two times a day (BID) | RESPIRATORY_TRACT | 5 refills | Status: DC

## 2021-08-02 NOTE — Progress Notes (Signed)
? ?[address], [place] [address] ?Dept: [phone] ? ?FOLLOW UP NOTE ? ?Patient ID: Dana Mcpherson, female    DOB: [birth year]  Age: 31 y.o. MRN: [id number] ?Date of Office Visit: 08/02/2021 ? ?Assessment  ?Chief Complaint: Urticaria (Says she is well. No issues. Still on Xolair) and Asthma (Says she had a slight flare ups when she was sick. ) ? ?HPI ?Dana Mcpherson is a 31 year old female who presents to the clinic for follow-up visit.  She was last seen in this clinic on 05/05/2021 by Dr. Ernst Bowler for evaluation of asthma, allergic rhinitis, chronic urticaria, and food allergy.  At today's visit, she reports her asthma has been moderately well controlled with wheeze and cough occurring only with illness.  She reports that she experienced tachycardia while using AirDuo 113 and has since stopped using that medication.  She continues albuterol infrequently with reports of heart racing and shakiness after albuterol use.  Allergic rhinitis is reported as moderately well controlled with nasal congestion, clear rhinorrhea, sneezing, and postnasal drainage.  She continues cetirizine 10 mg once a day and is not currently using nasal saline rinses.  She reports that she has had several sinus infections this year for which she required antibiotics for resolution of symptoms.  She reports that in previous years she generally requires about 3 antibiotics a year for resolution of sinus infections.  Chronic urticaria is reported as well controlled with no breakouts since her last visit to this clinic.  She continues Xolair injections once every 4 weeks with no larger local reactions.  She reports a significant decrease in her symptoms of chronic urticaria while continuing on Xolair.  She continues to avoid fish, shellfish, and egg with no accidental ingestion or EpiPen use since her last visit to this clinic  She does tolerate eggs baked into products such as cookies and cakes without adverse reaction.  Her  current medications are listed in the chart. ? ? ?Drug Allergies:  ?Allergies  ?Allergen Reactions  ? Other Anaphylaxis and Other (See Comments)  ?  Pt states that she is allergic to Axe/Tag body spray and Lysol.   ? ?General anesthesia - pt woke up several times, was scratching during surgery. Itching  ? ?Allergy to sun and bug bites.  ? Shellfish Allergy Anaphylaxis and Hives  ? Adhesive [Tape] Hives  ? Eggs Or Egg-Derived Products Itching and Nausea Only  ? Latex Itching  ? ? ?Physical Exam: ?BP 100/80   Pulse 75   Temp 98.5 ?F (36.9 ?C) (Temporal)   Resp 16   Ht 5' 6.5" (1.689 m)   Wt 247 lb 12.8 oz (112.4 kg)   SpO2 98%   BMI 39.40 kg/m?   ? ?Physical Exam ?Vitals reviewed.  ?Constitutional:   ?   Appearance: Normal appearance.  ?HENT:  ?   Head: Normocephalic and atraumatic.  ?   Right Ear: Tympanic membrane normal.  ?   Left Ear: Tympanic membrane normal.  ?   Nose:  ?   Comments: Bilateral naris edematous and pale with clear nasal drainage noted.  Pharynx normal.  Ears normal.  Eyes normal. ?   Mouth/Throat:  ?   Pharynx: Oropharynx is clear.  ?Eyes:  ?   Conjunctiva/sclera: Conjunctivae normal.  ?Cardiovascular:  ?   Rate and Rhythm: Normal rate and regular rhythm.  ?   Heart sounds: Normal heart sounds. No murmur heard. ?Pulmonary:  ?   Effort: Pulmonary effort is normal.  ?  Breath sounds: Normal breath sounds.  ?   Comments: Lungs clear to auscultation ?Musculoskeletal:     ?   General: Normal range of motion.  ?   Cervical back: Normal range of motion and neck supple.  ?Skin: ?   General: Skin is warm and dry.  ?Neurological:  ?   Mental Status: She is alert and oriented to person, place, and time.  ?Psychiatric:     ?   Mood and Affect: Mood normal.     ?   Behavior: Behavior normal.     ?   Thought Content: Thought content normal.     ?   Judgment: Judgment normal.  ? ? ?Diagnostics: ?FVC 2.85, FEV1 2.32.  Predicted FVC 4.05, predicted FEV1 3.38.  Spirometry indicates possible restriction.   This is consistent with previous spirometry readings ? ?Assessment and Plan: ?1. Recurrent infections   ?2. Mild intermittent asthma without complication   ?3. Perennial allergic rhinitis   ?4. Idiopathic urticaria   ?5. Anaphylactic shock due to food, subsequent encounter   ? ? ?Meds ordered this encounter  ?Medications  ? montelukast (SINGULAIR) 10 MG tablet  ?  Sig: Take 1 tablet (10 mg total) by mouth at bedtime.  ?  Dispense:  30 tablet  ?  Refill:  5  ? fluticasone (FLOVENT HFA) 110 MCG/ACT inhaler  ?  Sig: Inhale 2 puffs into the lungs 2 (two) times daily.  ?  Dispense:  1 each  ?  Refill:  5  ?  Please hold. Patient will call when needed. Thank you  ? ? ?Patient Instructions  ?Asthma ?Begin Singulair (montelukast) 10 mg once a day to prevent cough or wheeze. Patient cautioned that rarely some children/adults can experience behavioral changes after beginning montelukast. These side effects are rare, however, if you notice any change, notify the clinic and discontinue montelukast. ?Continue levalbuterol 2 puffs once every 4-6 hours as needed for cough or wheeze ?You may use levalbuterol 2 puffs 5 to 15 minutes before activity to decrease cough or wheeze ?For asthma flare, begin Flovent 110-2 puffs twice a day with a spacer for 2 weeks or until cough and wheeze free.  Call the clinic if you need to begin this medication. ? ?Allergic rhinitis ?Continue allergen avoidance measures directed toward dust mite and cockroach as listed below ?Begin montelukast as listed above to help control allergy symptoms ?Continue cetirizine 10 mg once a day as needed for runny nose or itch ?Consider saline nasal rinses as needed for nasal symptoms. Use this before any medicated nasal sprays for best result ? ?Hives (urticaria) ?Use the least amount of medications while remaining hive free ?Cetirizine (Zyrtec) '10mg'$  twice a day and famotidine (Pepcid) 20 mg twice a day. If no symptoms for 7-14 days then decrease to? ?Cetirizine  (Zyrtec) '10mg'$  twice a day and famotidine (Pepcid) 20 mg once a day.  If no symptoms for 7-14 days then decrease to? ?Cetirizine (Zyrtec) '10mg'$  twice a day.  If no symptoms for 7-14 days then decrease to? ?Cetirizine (Zyrtec) '10mg'$  once a day. ? ?May use Benadryl (diphenhydramine) as needed for breakthrough hives ?      If symptoms return, then step up dosage ?Keep a detailed symptom journal including foods eaten, contact with allergens, medications taken, weather changes.  ? ?Food allergy ?Continue to avoid fish, shellfish, and egg.  In case of an allergic reaction, take Benadryl 50 mg every 4 hours, and if life-threatening symptoms occur, inject with EpiPen 0.3 mg. ? ?  Recurrent infections ?Keep track of infections and antibiotics used ?A lab order has been placed to screen your immune system.  We will call you when the result becomes available ? ?Call the clinic if this treatment plan is not working well for you ? ?Follow up in 2 months or sooner if needed. ? ? ?Return in about 4 months (around 12/02/2021), or if symptoms worsen or fail to improve. ?  ? ?Thank you for the opportunity to care for this patient.  Please do not hesitate to contact me with questions. ? ?Gareth Morgan, FNP ?[facility] ? ? ? ? ? ?

## 2021-08-02 NOTE — Patient Instructions (Addendum)
Asthma ?Begin Singulair (montelukast) 10 mg once a day to prevent cough or wheeze. Patient cautioned that rarely some children/adults can experience behavioral changes after beginning montelukast. These side effects are rare, however, if you notice any change, notify the clinic and discontinue montelukast. ?Continue levalbuterol 2 puffs once every 4-6 hours as needed for cough or wheeze ?You may use levalbuterol 2 puffs 5 to 15 minutes before activity to decrease cough or wheeze ?For asthma flare, begin Flovent 110-2 puffs twice a day with a spacer for 2 weeks or until cough and wheeze free.  Call the clinic if you need to begin this medication. ? ?Allergic rhinitis ?Continue allergen avoidance measures directed toward dust mite and cockroach as listed below ?Begin montelukast as listed above to help control allergy symptoms ?Continue cetirizine 10 mg once a day as needed for runny nose or itch ?Consider saline nasal rinses as needed for nasal symptoms. Use this before any medicated nasal sprays for best result ? ?Hives (urticaria) ?Use the least amount of medications while remaining hive free ?Cetirizine (Zyrtec) '10mg'$  twice a day and famotidine (Pepcid) 20 mg twice a day. If no symptoms for 7-14 days then decrease to? ?Cetirizine (Zyrtec) '10mg'$  twice a day and famotidine (Pepcid) 20 mg once a day.  If no symptoms for 7-14 days then decrease to? ?Cetirizine (Zyrtec) '10mg'$  twice a day.  If no symptoms for 7-14 days then decrease to? ?Cetirizine (Zyrtec) '10mg'$  once a day. ? ?May use Benadryl (diphenhydramine) as needed for breakthrough hives ?      If symptoms return, then step up dosage ?Keep a detailed symptom journal including foods eaten, contact with allergens, medications taken, weather changes.  ? ?Food allergy ?Continue to avoid fish, shellfish, and egg.  In case of an allergic reaction, take Benadryl 50 mg every 4 hours, and if life-threatening symptoms occur, inject with EpiPen 0.3 mg. ? ?Recurrent  infections ?Keep track of infections and antibiotics used ?A lab order has been placed to screen your immune system.  We will call you when the result becomes available ? ?Call the clinic if this treatment plan is not working well for you ? ?Follow up in 4 months or sooner if needed. ? ?Control of Dust Mite Allergen ?Dust mites play a major role in allergic asthma and rhinitis. They occur in environments with high humidity wherever human skin is found. Dust mites absorb humidity from the atmosphere (ie, they do not drink) and feed on organic matter (including shed human and animal skin). Dust mites are a microscopic type of insect that you cannot see with the naked eye. High levels of dust mites have been detected from mattresses, pillows, carpets, upholstered furniture, bed covers, clothes, soft toys and any woven material. The principal allergen of the dust mite is found in its feces. A gram of dust may contain 1,000 mites and 250,000 fecal particles. Mite antigen is easily measured in the air during house cleaning activities. Dust mites do not bite and do not cause harm to humans, other than by triggering allergies/asthma. ? ?Ways to decrease your exposure to dust mites in your home: ? ?1. Encase mattresses, box springs and pillows with a mite-impermeable barrier or cover ? ?2. Wash sheets, blankets and drapes weekly in hot water (130? F) with detergent and dry them in a dryer on the hot setting. ? ?3. Have the room cleaned frequently with a vacuum cleaner and a damp dust-mop. For carpeting or rugs, vacuuming with a vacuum cleaner equipped with a  high-efficiency particulate air (HEPA) filter. The dust mite allergic individual should not be in a room which is being cleaned and should wait 1 hour after cleaning before going into the room. ? ?4. Do not sleep on upholstered furniture (eg, couches). ? ?5. If possible removing carpeting, upholstered furniture and drapery from the home is ideal. Horizontal blinds should  be eliminated in the rooms where the person spends the most time (bedroom, study, television room). Washable vinyl, roller-type shades are optimal. ? ?6. Remove all non-washable stuffed toys from the bedroom. Wash stuffed toys weekly like sheets and blankets above. ? ?7. Reduce indoor humidity to less than 50%. Inexpensive humidity monitors can be purchased at most hardware stores. Do not use a humidifier as can make the problem worse and are not recommended. ? ?Control of Cockroach Allergen ?Cockroach allergen has been identified as an important cause of acute attacks of asthma, especially in urban settings.  There are fifty-five species of cockroach that exist in the [place], however only three, the [place], [place] species produce allergen that can affect patients with Asthma.  Allergens can be obtained from fecal particles, egg casings and secretions from cockroaches. ?   ?Remove food sources. ?Reduce access to water. ?Seal access and entry points. ?Spray runways with 0.5-1% Diazinon or Chlorpyrifos ?Blow boric acid power under stoves and refrigerator. ?Place bait stations (hydramethylnon) at feeding sites. ? ?

## 2021-08-04 ENCOUNTER — Ambulatory Visit (HOSPITAL_COMMUNITY): Payer: 59

## 2021-08-04 ENCOUNTER — Encounter (HOSPITAL_COMMUNITY): Payer: Self-pay

## 2021-08-04 DIAGNOSIS — M25511 Pain in right shoulder: Secondary | ICD-10-CM | POA: Diagnosis not present

## 2021-08-04 DIAGNOSIS — R29898 Other symptoms and signs involving the musculoskeletal system: Secondary | ICD-10-CM

## 2021-08-04 DIAGNOSIS — G8929 Other chronic pain: Secondary | ICD-10-CM

## 2021-08-04 NOTE — Therapy (Signed)
?OUTPATIENT OCCUPATIONAL THERAPY TREATMENT NOTE ? ? ?Patient Name: Dana Mcpherson ?MRN: [id number] ?DOB:04/26/1990, 31 y.o., female ?Today's Date: 08/04/2021 ? ?PCP: Curlene Labrum, MD ?REFERRING PROVIDER: Dr. Larena Glassman ? ?END OF SESSION:  ? OT End of Session - 08/04/21 1713   ? ? Visit Number 3   ? Number of Visits 4   ? Date for OT Re-Evaluation 09/12/21   ? Authorization Type 1) UHC, $30 copay 2) [place] Medicaid   ? Authorization Time Period Medicaid: no auth required for pt's over 53 years old; 35 visits per year   ? Authorization - Visit Number 2   ? Authorization - Number of Visits 27   ? OT Start Time 0626   ? OT Stop Time 1515   ? OT Time Calculation (min) 42 min   ? Activity Tolerance Patient tolerated treatment well   ? Behavior During Therapy [facility] for tasks assessed/performed   ? ?  ?  ? ?  ? ? ? ?Past Medical History:  ?Diagnosis Date  ? Allergy   ? Anxiety   ? Asthma   ? Autoimmune disorder ([place])   ? Complication of anesthesia   ? woke up during surgery & ithing after surgery  ? Family history of adverse reaction to anesthesia   ? "My mom has the same trouble with anesthesia".  ? GERD (gastroesophageal reflux disease)   ? Migraine headache   ? Nexplanon in place 10/05/2015  ? 06/29/15 left arm  ? Polycystic ovarian syndrome   ? Pregnancy induced hypertension   ? Tachycardia   ? Urticaria   ? Vaginal Pap smear, abnormal   ? ?Past Surgical History:  ?Procedure Laterality Date  ? BIOPSY  06/19/2019  ? Procedure: BIOPSY;  Surgeon: Daneil Dolin, MD;  [facility];  Service: Endoscopy;;  ? CHOLECYSTECTOMY  2010  ? COLONOSCOPY WITH PROPOFOL N/A 06/19/2019  ? Procedure: COLONOSCOPY WITH PROPOFOL;  Surgeon: Daneil Dolin, MD; 1 tubular adenoma, otherwise normal-appearing colonic mucosa, normal TI.  Stool sample for C. difficile negative.  Recommended repeat colonoscopy in 7 years.  ? ESOPHAGOGASTRODUODENOSCOPY    ? ESOPHAGOGASTRODUODENOSCOPY (EGD) WITH PROPOFOL N/A 04/08/2019  ? Procedure:  ESOPHAGOGASTRODUODENOSCOPY (EGD) WITH PROPOFOL;  Surgeon: Daneil Dolin, MD; erosive reflux esophagitis s/p dilation, gastric mucosa congested diffusely with snakeskin appearance, antral erosions, congested eroded duodenal bulb, otherwise normal.  ? LAPAROSCOPIC BILATERAL SALPINGECTOMY Bilateral 10/28/2020  ? Procedure: LAPAROSCOPIC BILATERAL SALPINGECTOMY;  Surgeon: Florian Buff, MD;  Location: [facility];  Service: Gynecology;  Laterality: Bilateral;  ? MALONEY DILATION N/A 04/08/2019  ? Procedure: MALONEY DILATION;  Surgeon: Daneil Dolin, MD;  [facility];  Service: Endoscopy;  Laterality: N/A;  ? POLYPECTOMY  06/19/2019  ? Procedure: POLYPECTOMY;  Surgeon: Daneil Dolin, MD;  [facility];  Service: Endoscopy;;  cecal polyp  ? ?Patient Active Problem List  ? Diagnosis Date Noted  ? Idiopathic urticaria 08/02/2021  ? Recurrent infections 08/02/2021  ? Rash and other nonspecific skin eruption 03/01/2021  ? Encounter for sterilization   ? Positive ANA (antinuclear antibody) 08/31/2020  ? Weakness of both hands 08/31/2020  ? Upper back pain 08/31/2020  ? Mild intermittent asthma without complication 94/85/4627  ? Perennial allergic rhinitis 06/12/2020  ? Anaphylactic shock due to adverse food reaction 06/12/2020  ? Chronic urticaria 06/12/2020  ? Early satiety 03/11/2020  ? Abdominal pain 05/24/2019  ? Abdominal bloating 02/20/2019  ? Elevated LFTs 02/20/2019  ? Gastroesophageal reflux  disease 02/20/2019  ? Dysphagia 02/20/2019  ? Diarrhea 02/20/2019  ? Atypical pneumonia 02/19/2016  ? Pulmonary infiltrates 02/18/2016  ? Hemorrhoid 06/29/2015  ? Hidradenitis suppurativa 06/23/2015  ? Shortness of breath 11/04/2014  ? Tachycardia 10/07/2014  ? ASCUS with positive high risk HPV 05/28/2014  ? PCOS (polycystic ovarian syndrome) 03/03/2014  ? Hyperinsulinemia 03/03/2014  ? Morbid obesity ([place]) 05/23/2013  ? DUB (dysfunctional uterine bleeding) 05/23/2013  ? ? ?ONSET DATE: 04/2020 ? ?REFERRING  DIAG: right shoulder pain ? ?THERAPY DIAG:  ?Other symptoms and signs involving the musculoskeletal system ? ?Chronic right shoulder pain ? ? ?PERTINENT HISTORY: Pt is a 31 y/o female who has had right shoulder pain for over one year. X-ray was completed with no abnormalities. Pt also with potential cervical neck issues as she is dropping items and has poor grip strength, sensation changes.  ? ?PRECAUTIONS: None ? ?SUBJECTIVE: S: Was really painful after last session and the pain was 8/10. It just now came down. ? ?PAIN:  ?Are you having pain? Yes: NPRS scale: 4/10 ?Pain location: right shoulder ?Pain description: sore ?Aggravating factors: increased use ?Relieving factors: rest ? ? ? ? ?OBJECTIVE:  ? ?UE ROM    ?  ?Active ROM Right ?07/14/2021  ?Shoulder flexion 156  ?Shoulder abduction 140  ?Shoulder internal rotation 90  ?Shoulder external rotation 68  ?(Blank rows = not tested) ?  ?  ?  ?UE MMT:    ?  ?MMT Right ?07/14/2021  ?Shoulder flexion 4/5  ?Shoulder abduction 5/5  ?Shoulder internal rotation 4/5  ?Shoulder external rotation 4/5  ?(Blank rows = not tested) ?  ?HAND FUNCTION: ?Grip strength: Right: 28 lbs; Left: 40 lbs ? ? ?TODAY'S TREATMENT: ?08/04/21 ?-Myofascial release: completed separately from therapeutic exercises. Myofascial release to right upper arm, anterior shoulder, trapezius, and cervical  regions to decrease pain and fascial restrictions, increase joint ROM.  ?- Nerve gliding: medial, ulnar, and radial completed ?-Strengthening: green band, scapular, 12X, serratus anterior punch. ? ? ?07/30/2021 ?-Myofascial release: completed separately from therapeutic exercises. Myofascial release to right upper arm, anterior shoulder, and trapezius regions to decrease pain and fascial restrictions, increase joint ROM.  ?-Shoulder strengthening: 2#, protraction, flexion, er/IR, horizontal abduction, abduction, 10X each ?-Theraband strengthening: blue band, flexion, protraction, abduction, horizontal abduction,  er/IR, 10X each ?-Ball on wall: 1' flexion, 1' abduction, green ball ?  -UBE: level 2, 3' forward 3' reverse, pace: 6.5 ? ? ?PATIENT EDUCATION: ?Education details: ?Person educated:  ?IT sales professional:  ?Education comprehension:  ?  ?  ?HOME EXERCISE PROGRAM: ?3/22: shoulder stretches; 4/7: blue theraband strengthening ? ? ?SHORT TERM GOALS: Target date: 08/11/2021 ?  ?Pt will decrease pain in RUE to 3/10 or less to improve ability to reach behind back for dressing and peri-care.  ?Goal status: Ongoing ?  ?2.  Pt will be provided with and educated on HEP to improve mobility of RUE required for use as dominant during ADL completion.  ?  ?Goal status: Ongoing ?  ?3.  Pt will decrease RUE fascial restrictions to min amounts to improve mobility required for functional reaching tasks.  ?  ?Goal status: Ongoing ?  ?4.  Pt will increase RUE A/ROM to [facility] to improve ability to reach overhead and behind back when bathing and dressing.  ?  ?Goal status: Ongoing ?  ?5.  Pt will increase RUE strength to 4+/5 or greater to improve ability to perform lifting tasks at work.  ?  ?Goal status: Ongoing ?  ?6.  Pt will increase right grip strength by 10# or greater to improve ability to maintain her hold on items and to open jars using the right hand.  ?  ?Goal status: Ongoing ? ? ? ? ? ?   ?OT Assessment and Plan  ?Clinical Impression Statement A:Manual techniques focused on fascial restrictions in right upper arm, upper trapezius, and cervical region. Pressure modified during manual techniques due to increased pain felt after last session. Trialed nerve glides  for medial, ulnar, and radial nerve. All nerve glides did not seem to provide any significant amount of relief. Verbal cues and visual demonstration provided during session for form and technique.   ?Occupational performance deficits (Please refer to evaluation for details): ADL's;IADL's;Work;Leisure  ?Pt will benefit from skilled therapeutic intervention in order to improve on  the following performance deficits Body Structure / Function / Physical Skills  ?Body Structure / Function / Physical Skills ADL;Endurance;Muscle spasms;UE functional use;Fascial restriction;Pain;ROM;IAD

## 2021-08-10 ENCOUNTER — Ambulatory Visit (HOSPITAL_COMMUNITY): Payer: 59 | Admitting: Occupational Therapy

## 2021-08-10 ENCOUNTER — Encounter (HOSPITAL_COMMUNITY): Payer: Self-pay | Admitting: Occupational Therapy

## 2021-08-10 DIAGNOSIS — M25511 Pain in right shoulder: Secondary | ICD-10-CM | POA: Diagnosis not present

## 2021-08-10 DIAGNOSIS — G8929 Other chronic pain: Secondary | ICD-10-CM

## 2021-08-10 DIAGNOSIS — R29898 Other symptoms and signs involving the musculoskeletal system: Secondary | ICD-10-CM

## 2021-08-10 NOTE — Therapy (Signed)
?OUTPATIENT OCCUPATIONAL THERAPY REASSESSMENT, TREATMENT NOTE ?DISCHARGE SUMMARY  ? ? ?Patient Name: Dana Mcpherson ?MRN: [id number] ?DOB:17-Jun-1990, 31 y.o., female ?Today's Date: 08/10/2021 ? ?PCP: Curlene Labrum, MD ?REFERRING PROVIDER: Dr. Larena Glassman ? ?END OF SESSION:  ? OT End of Session - 08/10/21 1551   ? ? Visit Number 4   ? Number of Visits 4   ? Date for OT Re-Evaluation 09/12/21   ? Authorization Type 1) UHC, $30 copay 2) [place] Medicaid   ? Authorization Time Period Medicaid: no auth required for pt's over 68 years old; 34 visits per year   ? Authorization - Visit Number 3   ? Authorization - Number of Visits 27   ? OT Start Time 1520   ? OT Stop Time 8182   ? OT Time Calculation (min) 34 min   ? Activity Tolerance Patient tolerated treatment well   ? Behavior During Therapy [facility] for tasks assessed/performed   ? ?  ?  ? ?  ? ? ? ? ?Past Medical History:  ?Diagnosis Date  ? Allergy   ? Anxiety   ? Asthma   ? Autoimmune disorder ([place])   ? Complication of anesthesia   ? woke up during surgery & ithing after surgery  ? Family history of adverse reaction to anesthesia   ? "My mom has the same trouble with anesthesia".  ? GERD (gastroesophageal reflux disease)   ? Migraine headache   ? Nexplanon in place 10/05/2015  ? 06/29/15 left arm  ? Polycystic ovarian syndrome   ? Pregnancy induced hypertension   ? Tachycardia   ? Urticaria   ? Vaginal Pap smear, abnormal   ? ?Past Surgical History:  ?Procedure Laterality Date  ? BIOPSY  06/19/2019  ? Procedure: BIOPSY;  Surgeon: Daneil Dolin, MD;  [facility];  Service: Endoscopy;;  ? CHOLECYSTECTOMY  2010  ? COLONOSCOPY WITH PROPOFOL N/A 06/19/2019  ? Procedure: COLONOSCOPY WITH PROPOFOL;  Surgeon: Daneil Dolin, MD; 1 tubular adenoma, otherwise normal-appearing colonic mucosa, normal TI.  Stool sample for C. difficile negative.  Recommended repeat colonoscopy in 7 years.  ? ESOPHAGOGASTRODUODENOSCOPY    ? ESOPHAGOGASTRODUODENOSCOPY (EGD) WITH  PROPOFOL N/A 04/08/2019  ? Procedure: ESOPHAGOGASTRODUODENOSCOPY (EGD) WITH PROPOFOL;  Surgeon: Daneil Dolin, MD; erosive reflux esophagitis s/p dilation, gastric mucosa congested diffusely with snakeskin appearance, antral erosions, congested eroded duodenal bulb, otherwise normal.  ? LAPAROSCOPIC BILATERAL SALPINGECTOMY Bilateral 10/28/2020  ? Procedure: LAPAROSCOPIC BILATERAL SALPINGECTOMY;  Surgeon: Florian Buff, MD;  Location: [facility];  Service: Gynecology;  Laterality: Bilateral;  ? MALONEY DILATION N/A 04/08/2019  ? Procedure: MALONEY DILATION;  Surgeon: Daneil Dolin, MD;  [facility];  Service: Endoscopy;  Laterality: N/A;  ? POLYPECTOMY  06/19/2019  ? Procedure: POLYPECTOMY;  Surgeon: Daneil Dolin, MD;  [facility];  Service: Endoscopy;;  cecal polyp  ? ?Patient Active Problem List  ? Diagnosis Date Noted  ? Idiopathic urticaria 08/02/2021  ? Recurrent infections 08/02/2021  ? Rash and other nonspecific skin eruption 03/01/2021  ? Encounter for sterilization   ? Positive ANA (antinuclear antibody) 08/31/2020  ? Weakness of both hands 08/31/2020  ? Upper back pain 08/31/2020  ? Mild intermittent asthma without complication 99/37/1696  ? Perennial allergic rhinitis 06/12/2020  ? Anaphylactic shock due to adverse food reaction 06/12/2020  ? Chronic urticaria 06/12/2020  ? Early satiety 03/11/2020  ? Abdominal pain 05/24/2019  ? Abdominal bloating 02/20/2019  ? Elevated LFTs  02/20/2019  ? Gastroesophageal reflux disease 02/20/2019  ? Dysphagia 02/20/2019  ? Diarrhea 02/20/2019  ? Atypical pneumonia 02/19/2016  ? Pulmonary infiltrates 02/18/2016  ? Hemorrhoid 06/29/2015  ? Hidradenitis suppurativa 06/23/2015  ? Shortness of breath 11/04/2014  ? Tachycardia 10/07/2014  ? ASCUS with positive high risk HPV 05/28/2014  ? PCOS (polycystic ovarian syndrome) 03/03/2014  ? Hyperinsulinemia 03/03/2014  ? Morbid obesity ([place]) 05/23/2013  ? DUB (dysfunctional uterine bleeding) 05/23/2013   ? ? ?ONSET DATE: 04/2020 ? ?REFERRING DIAG: right shoulder pain ? ?THERAPY DIAG:  ?Other symptoms and signs involving the musculoskeletal system ? ?Chronic right shoulder pain ? ? ?PERTINENT HISTORY: Pt is a 31 y/o female who has had right shoulder pain for over one year. X-ray was completed with no abnormalities. Pt also with potential cervical neck issues as she is dropping items and has poor grip strength, sensation changes.  ? ?PRECAUTIONS: None ? ?SUBJECTIVE: S: I am still dropping things.  ? ?PAIN:  ?Are you having pain? Yes: NPRS scale: 0/10 ?Pain location:   ?Pain description:   ?Aggravating factors:   ?Relieving factors:   ? ? ? ? ?OBJECTIVE:  ? ? FOTO: 63/100 (65/100 previous) ? ?UE ROM    ?  ?Active ROM Right ?07/14/2021  ?08/10/21  ?Shoulder flexion 156 168  ?Shoulder abduction 140 155  ?Shoulder internal rotation 90 90  ?Shoulder external rotation 68 68  ?(Blank rows = not tested) ?  ?  ?  ?UE MMT:    ?  ?MMT Right ?07/14/2021  ?08/10/21  ?Shoulder flexion 4/5 4+/5  ?Shoulder abduction 5/5 4/5  ?Shoulder internal rotation 4/5 4+/5  ?Shoulder external rotation 4/5 4/5  ?(Blank rows = not tested) ?  ?HAND FUNCTION: ?Grip strength: Right: 28 lbs; Left: 40 lbs ?Grip strength: Right: 43 lbs; Left: 45 lbs ? ? ?TODAY'S TREATMENT: ?08/10/21 ?-Shoulder strengthening: 2#, flexion, protraction, abduction, horizontal abduction, er/IR, 10X each ?-X to V arms, 10X, 2# ?-Ball on wall: 1' flexion, 1' abduction ? ? ? ?08/04/21 ?-Myofascial release: completed separately from therapeutic exercises. Myofascial release to right upper arm, anterior shoulder, trapezius, and cervical  regions to decrease pain and fascial restrictions, increase joint ROM.  ?- Nerve gliding: medial, ulnar, and radial completed ?-Strengthening: green band, scapular, 12X, serratus anterior punch. ? ? ?07/30/2021 ?-Myofascial release: completed separately from therapeutic exercises. Myofascial release to right upper arm, anterior shoulder, and trapezius  regions to decrease pain and fascial restrictions, increase joint ROM.  ?-Shoulder strengthening: 2#, protraction, flexion, er/IR, horizontal abduction, abduction, 10X each ?-Theraband strengthening: blue band, flexion, protraction, abduction, horizontal abduction, er/IR, 10X each ?-Ball on wall: 1' flexion, 1' abduction, green ball ?  -UBE: level 2, 3' forward 3' reverse, pace: 6.5 ? ? ?PATIENT EDUCATION: ?Education details: educated on HEP to continue ?Person educated: patient ?Education method: explanation, demonstration ?Education comprehension: verbalized understanding, returned demonstration ?  ?  ?HOME EXERCISE PROGRAM: ?3/22: shoulder stretches; 4/7: blue theraband strengthening ? ? ?SHORT TERM GOALS: Target date: 08/11/2021 ?  ?Pt will decrease pain in RUE to 3/10 or less to improve ability to reach behind back for dressing and peri-care.  ?Goal status: Achieved ?  ?2.  Pt will be provided with and educated on HEP to improve mobility of RUE required for use as dominant during ADL completion.  ?  ?Goal status: Achieved ?  ?3.  Pt will decrease RUE fascial restrictions to min amounts to improve mobility required for functional reaching tasks.  ?  ?Goal status: Achieved ?  ?  4.  Pt will increase RUE A/ROM to [facility] to improve ability to reach overhead and behind back when bathing and dressing.  ?  ?Goal status: Achieved ?  ?5.  Pt will increase RUE strength to 4+/5 or greater to improve ability to perform lifting tasks at work.  ?  ?Goal status: Partially Met ?  ?6.  Pt will increase right grip strength by 10# or greater to improve ability to maintain her hold on items and to open jars using the right hand.  ?  ?Goal status: Achieved ? ? ? ? ? ?   ?OT Assessment and Plan  ?Clinical Impression Statement A: Reassessment completed this session, pt has met all goals, reports improvement in ability to reach behind her back and perform dressing/peri-tasks. Pt continues have difficulty with dropping items, weight does not  matter, nerve involvement suspected. Continued with strengthening, using dumbbells versus theraband today. Added X to V arms and completed ball on wall. Verbal cues and visual demonstration provided during se

## 2021-08-13 ENCOUNTER — Other Ambulatory Visit: Payer: Self-pay | Admitting: Student

## 2021-08-13 ENCOUNTER — Encounter: Payer: Self-pay | Admitting: Orthopedic Surgery

## 2021-08-13 ENCOUNTER — Ambulatory Visit: Payer: 59

## 2021-08-13 ENCOUNTER — Ambulatory Visit (INDEPENDENT_AMBULATORY_CARE_PROVIDER_SITE_OTHER): Payer: 59 | Admitting: Orthopedic Surgery

## 2021-08-13 VITALS — Ht 66.5 in | Wt 247.0 lb

## 2021-08-13 DIAGNOSIS — M792 Neuralgia and neuritis, unspecified: Secondary | ICD-10-CM

## 2021-08-14 ENCOUNTER — Encounter: Payer: Self-pay | Admitting: Orthopedic Surgery

## 2021-08-14 NOTE — Progress Notes (Signed)
Return patient Visit ? ?Assessment: ?Dana Mcpherson is a 31 y.o. female with the following: ?1. Chronic right shoulder pain; anterior shoulder, possible proximal biceps tendinitis ?2.  Cervical radiculopathy; numbness tingling, weakness in her hand ? ? ?Plan: ?Dana has worked with physical therapy for her right shoulder.  She is noted limited improvement in her symptoms overall.  She continues to have some pain in the anterior aspect of the right shoulder, and this is exacerbated by certain motions.  More concerning, or the chronic symptoms she is having in her right hand.  She does note some occasional numbness and tingling in all fingers.  She also notes occasional weakness in the hand.  She reports that she has been dropping things.  Radiographs today are without acute injury.  She has loss of the normal cervical curvature, without additional findings.  Based on the constellation of her symptoms, and limited improvement with physical therapy, we discussed obtaining an MRI.  She is in agreement with this plan.  She is aware that this may not demonstrate any identifiable pathology, and if not, we will pursue further imaging for the right shoulder.  Follow-up once the cervical spine MRI is complete. ? ? ?Follow-up: ?Return for After MRI. ? ?Subjective: ? ?Chief Complaint  ?Patient presents with  ? Shoulder Pain  ?  Rt shoulder pain  ? ? ?History of Present Illness: ?Dana N Nosal is a 31 y.o. female who returns to clinic today for repeat evaluation of her right shoulder.  She was seen in clinic a few months ago, complaining of right shoulder pain.  She was evaluated by physical therapy, and has been working diligently on her exercises.  She continues to have pain in the anterior aspect of the right shoulder.  In addition, she continues to have numbness and tingling in her right hand, as well as weakness.  She reports she is dropping things.  She states physical therapy thinks that she has something going on  within her neck.  No specific injury.  Her neck does not hurt.  She denies issues with her balance. ? ? ?Review of Systems: ?No fevers or chills ?Occasional numbness and tingling ?No chest pain ?No shortness of breath ?No bowel or bladder dysfunction ?No GI distress ?No headaches ? ? ?Objective: ?Ht 5' 6.5" (1.689 m)   Wt 247 lb (112 kg)   BMI 39.27 kg/m?  ? ?Physical Exam: ? ?General: Alert and oriented. and No acute distress. ?Gait: Normal gait. ? ?Right shoulder without deformity.  No atrophy is appreciated.  Tenderness palpation over the anterior shoulder, and the bicipital groove.  Positive O'Brien's.  Mildly positive speeds.  Negative Yergason's.  Grip strength is 5/5.  Mild discomfort with strength testing of the shoulder.  Normal cervical range of motion. ? ?IMAGING: ?I personally ordered and reviewed the following images ? ?X-rays of the cervical spine were obtained in clinic today.  No acute injuries are noted.  Loss of normal curvature of the cervical vertebrae.  No anterolisthesis.  Well-maintained disc heights. ? ?Impression: Cervical spine x-ray without natural curvature. ? ?New Medications:  ?No orders of the defined types were placed in this encounter. ? ? ? ? ?Mordecai Rasmussen, MD ? ?08/14/2021 ?1:28 PM ? ? ?

## 2021-08-17 ENCOUNTER — Encounter (HOSPITAL_COMMUNITY): Payer: 59 | Admitting: Occupational Therapy

## 2021-08-27 ENCOUNTER — Telehealth: Payer: Self-pay | Admitting: Student

## 2021-08-27 MED ORDER — IVABRADINE HCL 7.5 MG PO TABS: 7.5000 mg | ORAL_TABLET | Freq: Two times a day (BID) | ORAL | 0 refills | Status: DC

## 2021-08-27 NOTE — Telephone Encounter (Signed)
?*  STAT* If patient is at the pharmacy, call can be transferred to refill team. ? ? ?1. Which medications need to be refilled? (please list name of each medication and dose if known)  ?CORLANOR 7.5 MG TABS tablet ? ?2. Which pharmacy/location (including street and city if local pharmacy) is medication to be sent to? ?[place], [place] ? ?3. Do they need a 30 day or 90 day supply? 90 with refills ? ?Patient is scheduled to see Dr. Gasper Sells 10/22/21. Patient states she only has one pill left  ?

## 2021-08-27 NOTE — Telephone Encounter (Signed)
Completed.

## 2021-08-30 ENCOUNTER — Ambulatory Visit: Payer: 59

## 2021-09-01 ENCOUNTER — Ambulatory Visit: Payer: 59

## 2021-09-03 ENCOUNTER — Ambulatory Visit (HOSPITAL_COMMUNITY)
Admission: RE | Admit: 2021-09-03 | Discharge: 2021-09-03 | Disposition: A | Discharge status: Home / Self Care | Payer: 59 | Source: Ambulatory Visit | Attending: Orthopedic Surgery | Admitting: Orthopedic Surgery

## 2021-09-03 DIAGNOSIS — M792 Neuralgia and neuritis, unspecified: Secondary | ICD-10-CM | POA: Diagnosis present

## 2021-09-03 IMAGING — MR MR CERVICAL SPINE W/O CM
5 series · 39 of 48 positions shown · non-contrast
Comparison: Radiograph from [DATE].

CLINICAL DATA: Initial evaluation for cervical radiculopathy, loss
of grip in bilateral hands.

EXAM:
MRI CERVICAL SPINE WITHOUT CONTRAST
TECHNIQUE: Multiplanar, multisequence MR imaging of the cervical spine was
performed. No intravenous contrast was administered.

[Series 5: T2 · sagittal · 3.0mm · 0.69mm/px · 7 of 15 slices shown (1 of 2)]
[im 1/15]
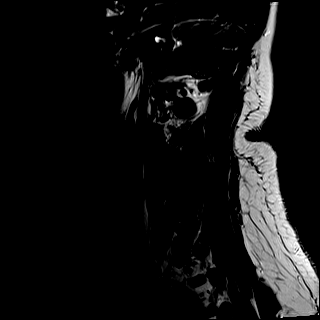
[im 3/15]
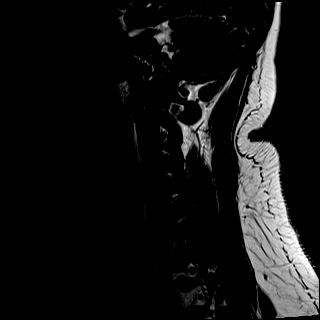
[im 5/15]
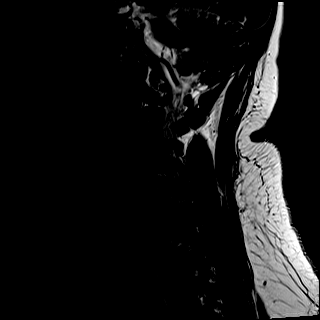
[im 8/15]
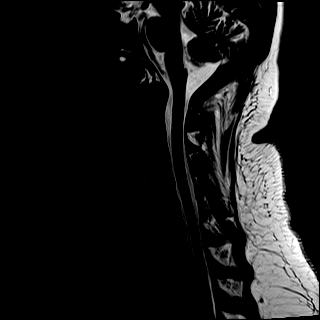
[im 10/15]
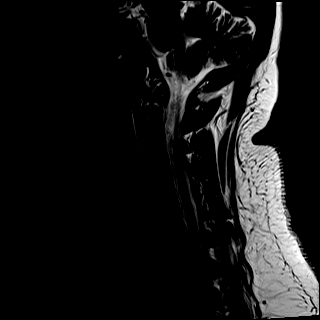
[im 12/15]
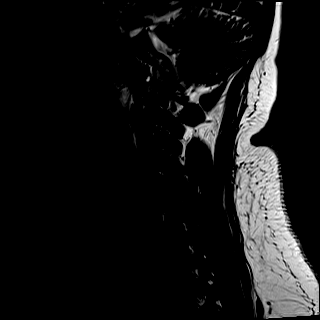
[im 15/15]
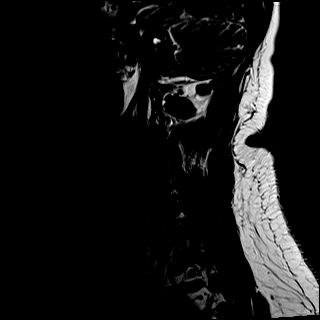

[Series 6: T1 · sagittal · 3.0mm · 0.86mm/px · 7 of 15 slices shown]
[im 1/15]
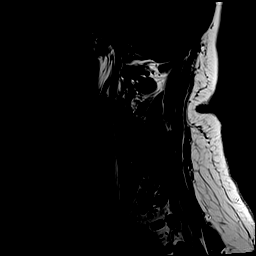
[im 3/15]
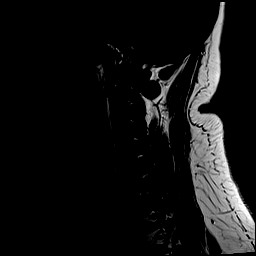
[im 5/15]
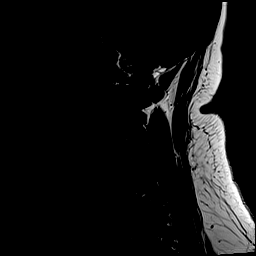
[im 8/15]
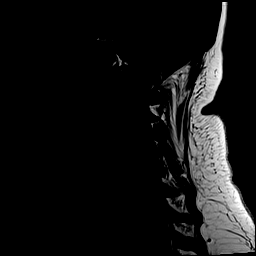
[im 10/15]
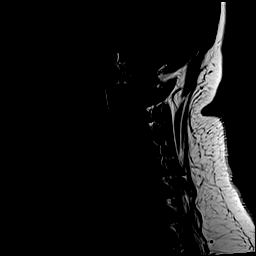
[im 12/15]
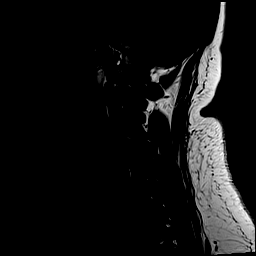
[im 15/15]
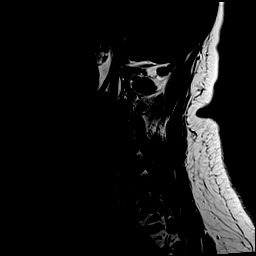

[Series 7: STIR · sagittal · 3.0mm · 0.69mm/px · 6 of 15 slices shown]
[im 1/15]
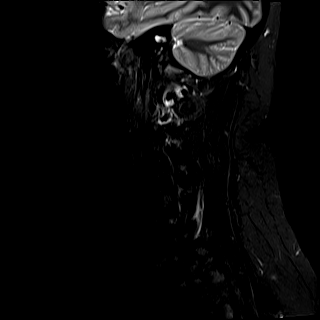
[im 3/15]
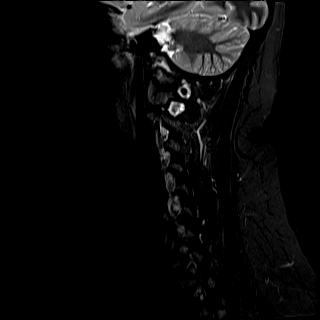
[im 6/15]
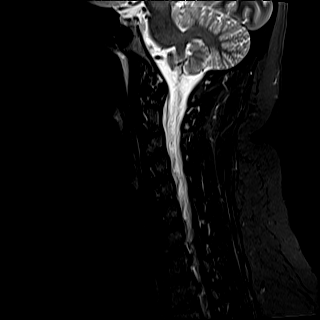
[im 9/15]
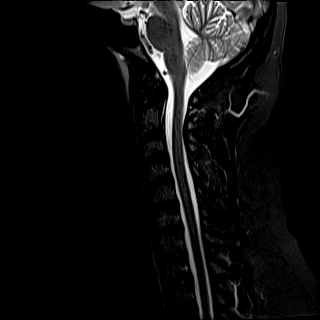
[im 12/15]
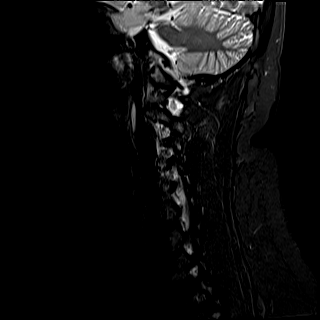
[im 15/15]
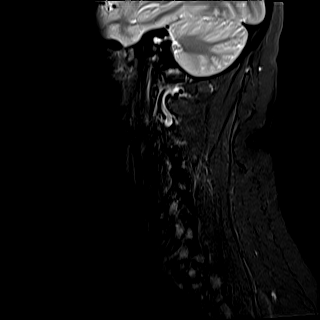

[Series 8: T2 · axial · 3.0mm · 0.70mm/px · z∈[-21,+88]mm · 11 of 33 slices shown (2 of 2)]
[im 1/33]
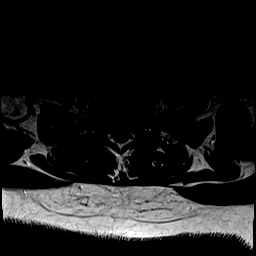
[im 3/33]
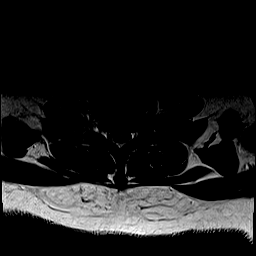
[im 5/33]
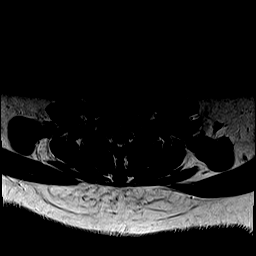
[im 8/33]
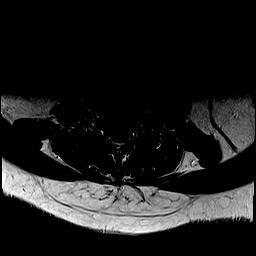
[im 10/33]
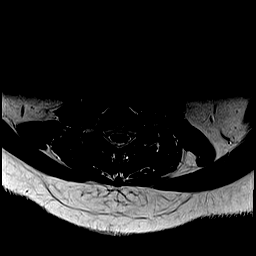
[im 13/33]
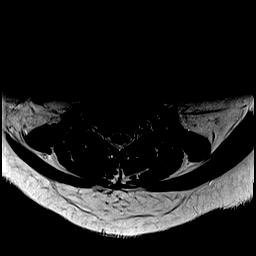
[im 15/33]
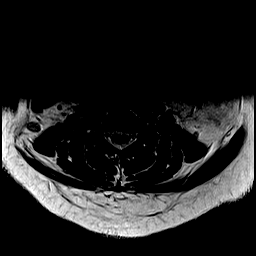
[im 18/33]
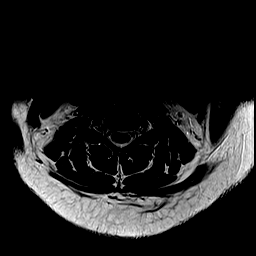
[im 23/33]
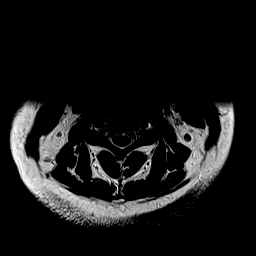
[im 28/33]
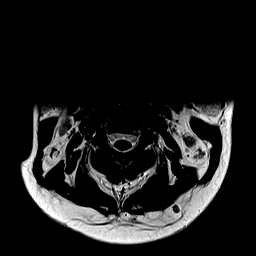
[im 33/33]
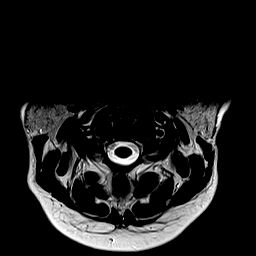

[Series 9: GRE · axial · 3.0mm · 0.35mm/px · z∈[-21,+88]mm · 8 of 33 slices shown]
[im 1/33]
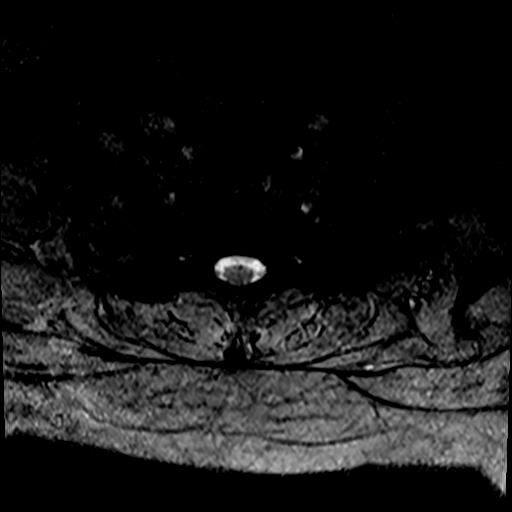
[im 5/33]
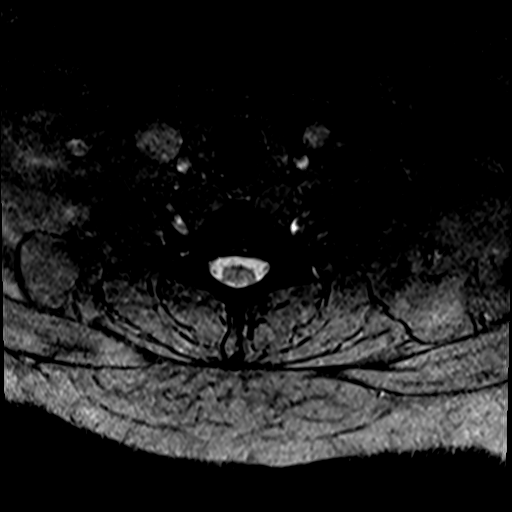
[im 10/33]
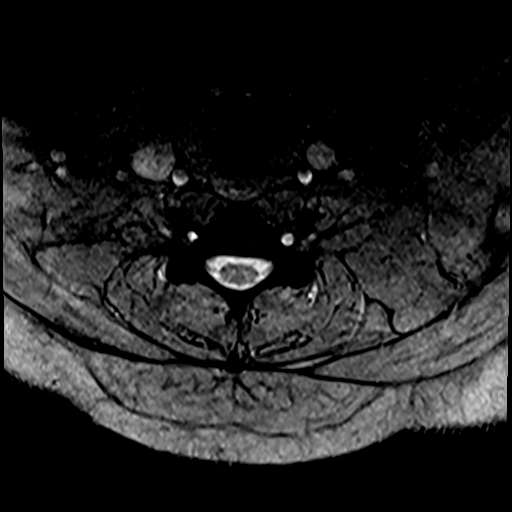
[im 15/33]
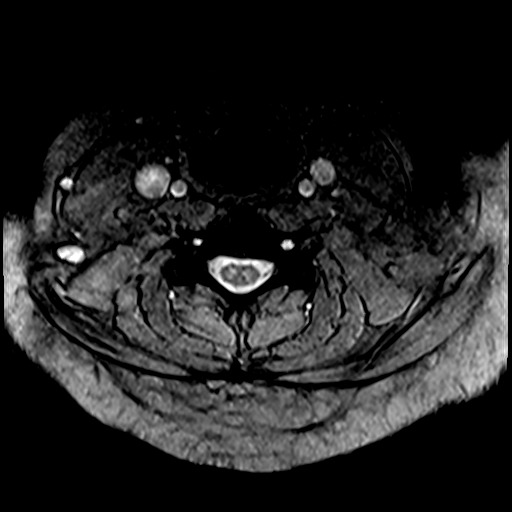
[im 18/33]
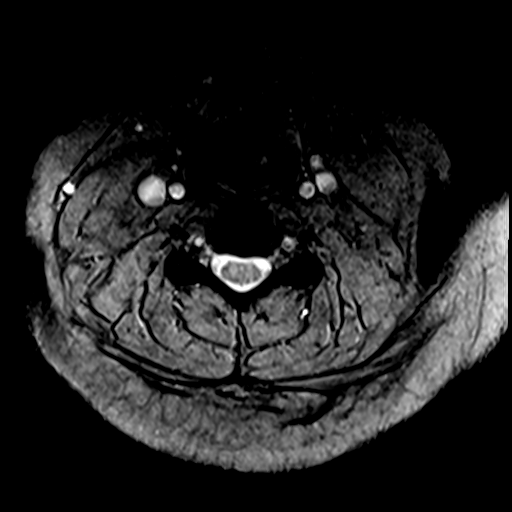
[im 23/33]
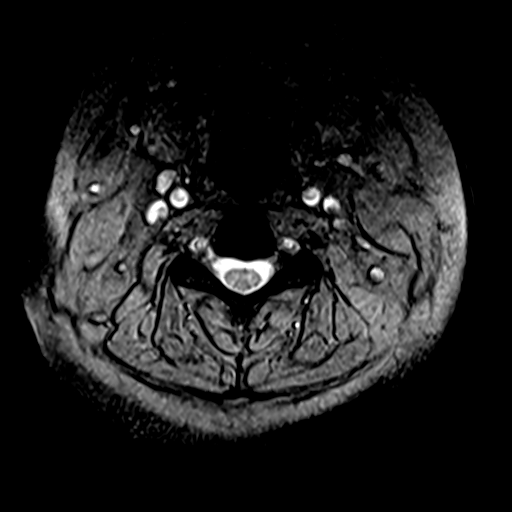
[im 28/33]
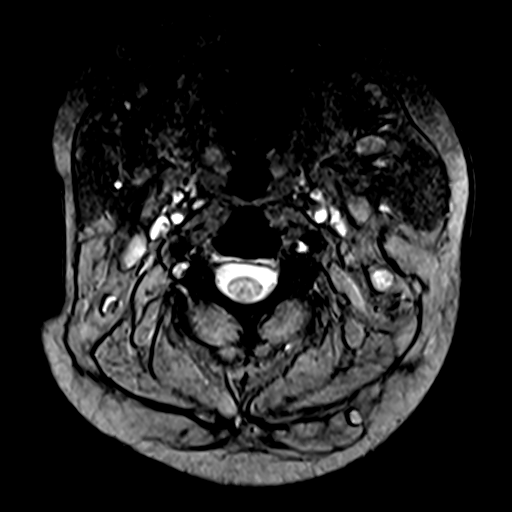
[im 33/33]
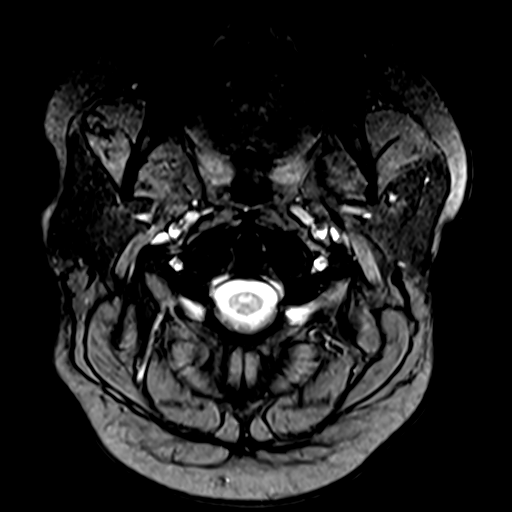

[39 of 48 positions shown; findings below may reference images not displayed]

FINDINGS: Alignment: Straightening of the normal cervical lordosis. No
listhesis.

Vertebrae: Vertebral body height maintained without acute or chronic
fracture. Bone marrow signal intensity diffusely decreased on T1
weighted sequence, nonspecific, but most commonly related to anemia,
smoking or obesity. No discrete or worrisome osseous lesions or
abnormal marrow edema.

Cord: Normal signal morphology.

Posterior Fossa, vertebral arteries, paraspinal tissues: Visualized
brain and posterior fossa within normal limits. Craniocervical
junction normal. Paraspinous and prevertebral soft tissues within
normal limits. Normal intravascular flow voids seen within the
vertebral arteries bilaterally.

Disc levels:

C2-C3: Unremarkable.

C3-C4: Tiny central disc protrusion minimally indents the ventral
thecal sac. No canal or foraminal stenosis.

C4-C5:  Unremarkable.

C5-C6:  Minimal annular disc bulge.  No canal or foraminal stenosis.

C6-C7: Mild disc bulge with endplate and uncovertebral spurring. No
spinal stenosis. Foramina remain patent.

C7-T1:  Unremarkable.
IMPRESSION: 1. Mild degenerative disc disease at C3-4, C5-6, and C6-7 without
significant stenosis or neural impingement.
2. Otherwise unremarkable MRI of the cervical spine.

## 2021-09-08 ENCOUNTER — Ambulatory Visit (INDEPENDENT_AMBULATORY_CARE_PROVIDER_SITE_OTHER): Payer: 59

## 2021-09-08 DIAGNOSIS — L501 Idiopathic urticaria: Secondary | ICD-10-CM | POA: Diagnosis not present

## 2021-09-14 ENCOUNTER — Emergency Department (HOSPITAL_COMMUNITY): Payer: 59

## 2021-09-14 ENCOUNTER — Encounter (HOSPITAL_COMMUNITY): Payer: Self-pay | Admitting: Emergency Medicine

## 2021-09-14 ENCOUNTER — Emergency Department (HOSPITAL_COMMUNITY)
Admission: EM | Admit: 2021-09-14 | Discharge: 2021-09-14 | Disposition: A | Discharge status: Home / Self Care | Payer: 59 | Attending: Emergency Medicine | Admitting: Emergency Medicine

## 2021-09-14 ENCOUNTER — Other Ambulatory Visit: Payer: Self-pay

## 2021-09-14 DIAGNOSIS — R55 Syncope and collapse: Secondary | ICD-10-CM | POA: Insufficient documentation

## 2021-09-14 DIAGNOSIS — Z91040 Latex allergy status: Secondary | ICD-10-CM | POA: Insufficient documentation

## 2021-09-14 DIAGNOSIS — G43809 Other migraine, not intractable, without status migrainosus: Secondary | ICD-10-CM

## 2021-09-14 DIAGNOSIS — Z7951 Long term (current) use of inhaled steroids: Secondary | ICD-10-CM | POA: Diagnosis not present

## 2021-09-14 DIAGNOSIS — R531 Weakness: Secondary | ICD-10-CM | POA: Insufficient documentation

## 2021-09-14 DIAGNOSIS — R471 Dysarthria and anarthria: Secondary | ICD-10-CM | POA: Insufficient documentation

## 2021-09-14 DIAGNOSIS — R200 Anesthesia of skin: Secondary | ICD-10-CM

## 2021-09-14 DIAGNOSIS — R4781 Slurred speech: Secondary | ICD-10-CM | POA: Insufficient documentation

## 2021-09-14 DIAGNOSIS — R079 Chest pain, unspecified: Secondary | ICD-10-CM | POA: Insufficient documentation

## 2021-09-14 DIAGNOSIS — J45909 Unspecified asthma, uncomplicated: Secondary | ICD-10-CM | POA: Diagnosis not present

## 2021-09-14 DIAGNOSIS — Z20822 Contact with and (suspected) exposure to covid-19: Secondary | ICD-10-CM | POA: Insufficient documentation

## 2021-09-14 DIAGNOSIS — G43009 Migraine without aura, not intractable, without status migrainosus: Secondary | ICD-10-CM | POA: Diagnosis not present

## 2021-09-14 DIAGNOSIS — R42 Dizziness and giddiness: Secondary | ICD-10-CM | POA: Insufficient documentation

## 2021-09-14 LAB — CBC WITH DIFFERENTIAL/PLATELET
Abs Immature Granulocytes: 0.06 10*3/uL (ref 0.00–0.07)
Basophils Absolute: 0.1 10*3/uL (ref 0.0–0.1)
Basophils Relative: 1 %
Eosinophils Absolute: 0.1 10*3/uL (ref 0.0–0.5)
Eosinophils Relative: 1 %
HCT: 42.9 % (ref 36.0–46.0)
Hemoglobin: 13.8 g/dL (ref 12.0–15.0)
Immature Granulocytes: 0 %
Lymphocytes Relative: 21 %
Lymphs Abs: 3 10*3/uL (ref 0.7–4.0)
MCH: 28.3 pg (ref 26.0–34.0)
MCHC: 32.2 g/dL (ref 30.0–36.0)
MCV: 88.1 fL (ref 80.0–100.0)
Monocytes Absolute: 0.9 10*3/uL (ref 0.1–1.0)
Monocytes Relative: 6 %
Neutro Abs: 10 10*3/uL — ABNORMAL HIGH (ref 1.7–7.7)
Neutrophils Relative %: 71 %
Platelets: 356 10*3/uL (ref 150–400)
RBC: 4.87 MIL/uL (ref 3.87–5.11)
RDW: 13.8 % (ref 11.5–15.5)
WBC: 14.1 10*3/uL — ABNORMAL HIGH (ref 4.0–10.5)
nRBC: 0 % (ref 0.0–0.2)

## 2021-09-14 LAB — COMPREHENSIVE METABOLIC PANEL
ALT: 57 U/L — ABNORMAL HIGH (ref 0–44)
AST: 33 U/L (ref 15–41)
Albumin: 4 g/dL (ref 3.5–5.0)
Alkaline Phosphatase: 83 U/L (ref 38–126)
Anion gap: 8 (ref 5–15)
BUN: 9 mg/dL (ref 6–20)
CO2: 21 mmol/L — ABNORMAL LOW (ref 22–32)
Calcium: 9 mg/dL (ref 8.9–10.3)
Chloride: 109 mmol/L (ref 98–111)
Creatinine, Ser: 0.91 mg/dL (ref 0.44–1.00)
GFR, Estimated: >60 mL/min (ref >60)
Glucose, Bld: 118 mg/dL — ABNORMAL HIGH (ref 70–99)
Potassium: 3.5 mmol/L (ref 3.5–5.1)
Sodium: 138 mmol/L (ref 135–145)
Total Bilirubin: 0.2 mg/dL — ABNORMAL LOW (ref 0.3–1.2)
Total Protein: 8.1 g/dL (ref 6.5–8.1)

## 2021-09-14 LAB — RAPID URINE DRUG SCREEN, HOSP PERFORMED
Amphetamines: NOT DETECTED
Barbiturates: NOT DETECTED
Benzodiazepines: NOT DETECTED
Cocaine: NOT DETECTED
Opiates: NOT DETECTED
Tetrahydrocannabinol: NOT DETECTED

## 2021-09-14 LAB — URINALYSIS, ROUTINE W REFLEX MICROSCOPIC
Bacteria, UA: NONE SEEN
Bilirubin Urine: NEGATIVE
Glucose, UA: NEGATIVE mg/dL
Ketones, ur: NEGATIVE mg/dL
Leukocytes,Ua: NEGATIVE
Nitrite: NEGATIVE
Protein, ur: NEGATIVE mg/dL
Specific Gravity, Urine: 1.009 (ref 1.005–1.030)
pH: 7 (ref 5.0–8.0)

## 2021-09-14 LAB — RESP PANEL BY RT-PCR (FLU A&B, COVID) ARPGX2
Influenza A by PCR: NEGATIVE
Influenza B by PCR: NEGATIVE
SARS Coronavirus 2 by RT PCR: NEGATIVE

## 2021-09-14 LAB — PROTIME-INR
INR: 0.9 (ref 0.8–1.2)
Prothrombin Time: 11.9 seconds (ref 11.4–15.2)

## 2021-09-14 LAB — CBG MONITORING, ED: Glucose-Capillary: 232 mg/dL — ABNORMAL HIGH (ref 70–99)

## 2021-09-14 LAB — PREGNANCY, URINE: Preg Test, Ur: NEGATIVE

## 2021-09-14 LAB — ETHANOL: Alcohol, Ethyl (B): <10 mg/dL (ref <10)

## 2021-09-14 LAB — APTT: aPTT: 29 seconds (ref 24–36)

## 2021-09-14 IMAGING — CT CT HEAD CODE STROKE
3 of 4 series · 16 of 47 positions shown, 19 images · non-contrast
Comparison: Head CT [DATE].

CLINICAL DATA: Code stroke. Neuro deficit, acute, stroke suspected.
Syncopal episode.



[Series 2: head w o · axial · 0.40mm/px · z∈[-14,+112]mm · 10 of 31 slices shown, 13 images]
[im 3/31  brain]
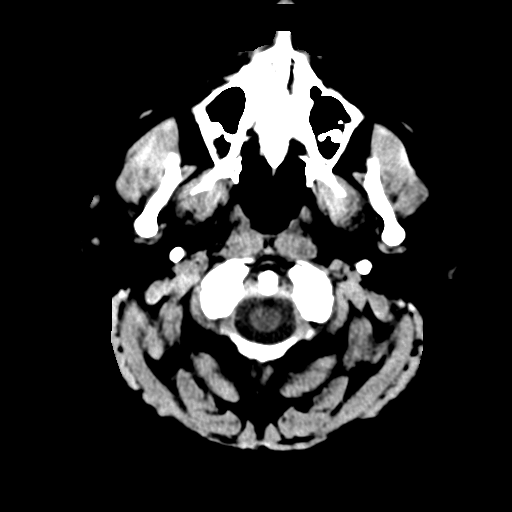
[im 3/31  bone]
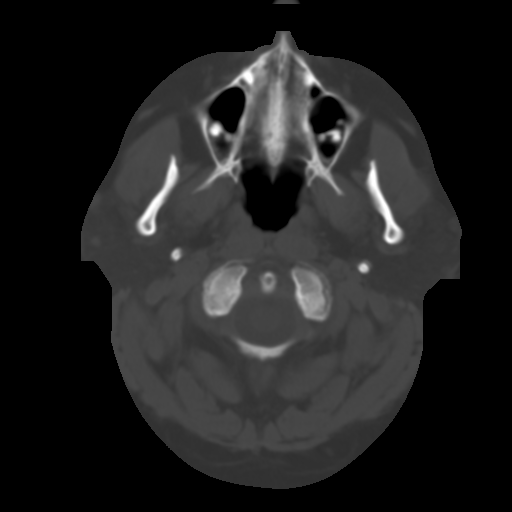
[im 5/31  brain]
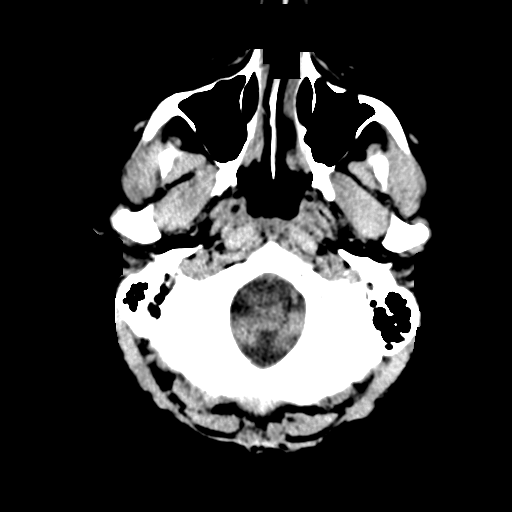
[im 9/31  brain]
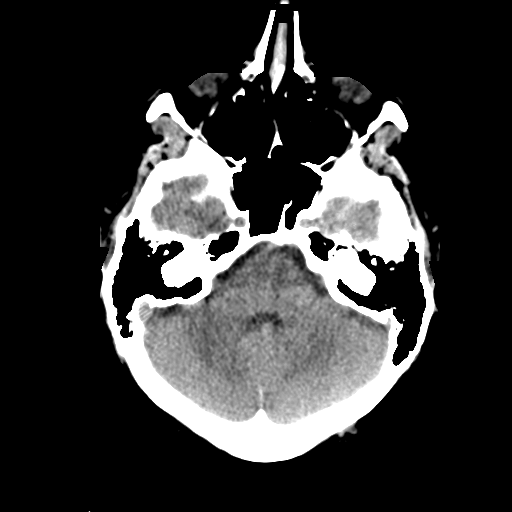
[im 11/31  brain]
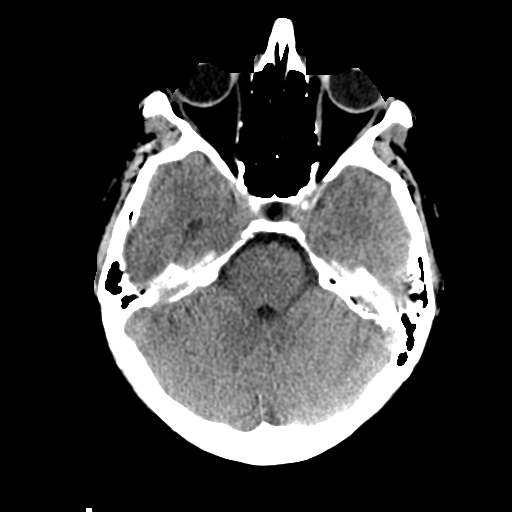
[im 13/31  brain]
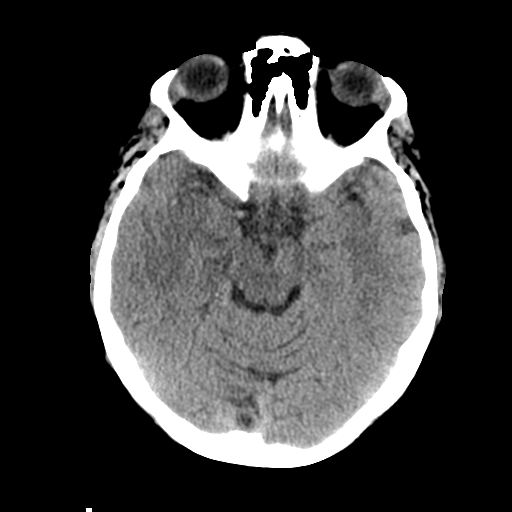
[im 13/31  bone]
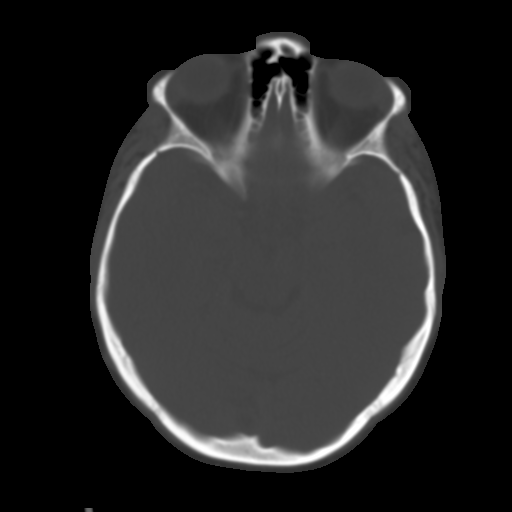
[im 18/31  brain]
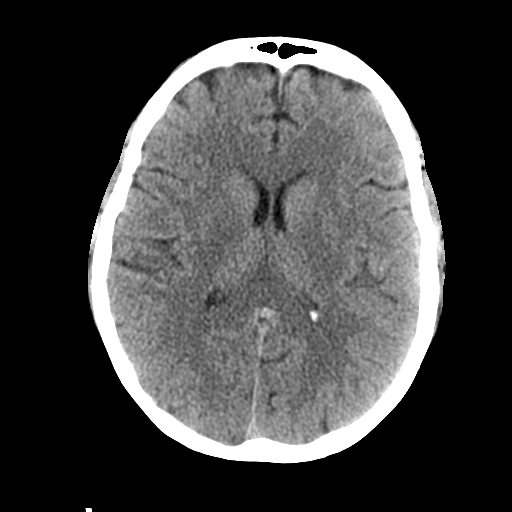
[im 20/31  brain]
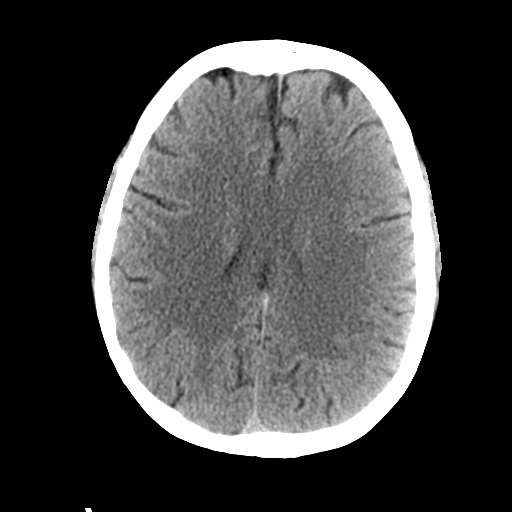
[im 22/31  brain]
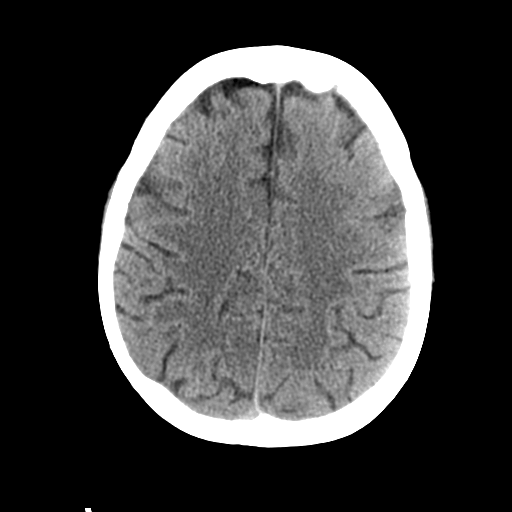
[im 26/31  brain]
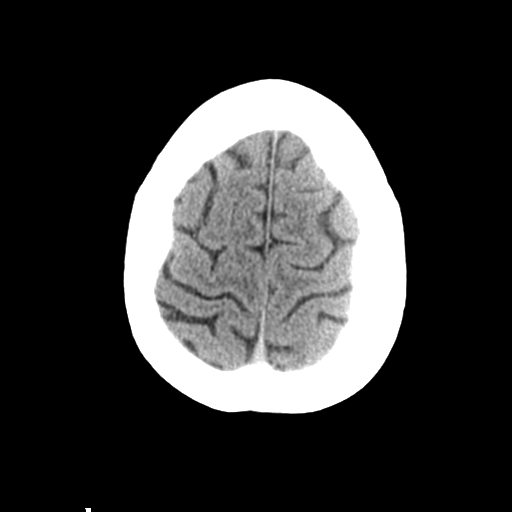
[im 26/31  bone]
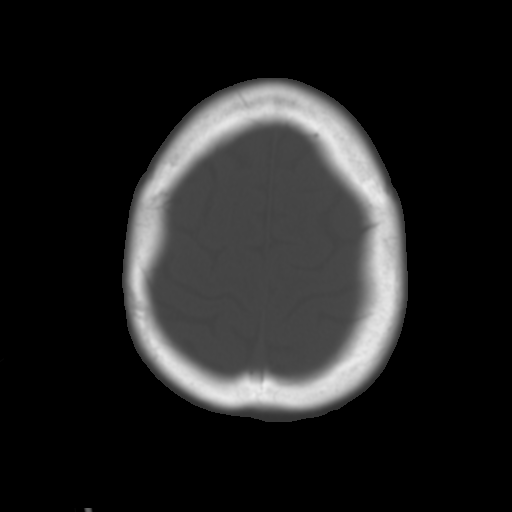
[im 28/31  brain]
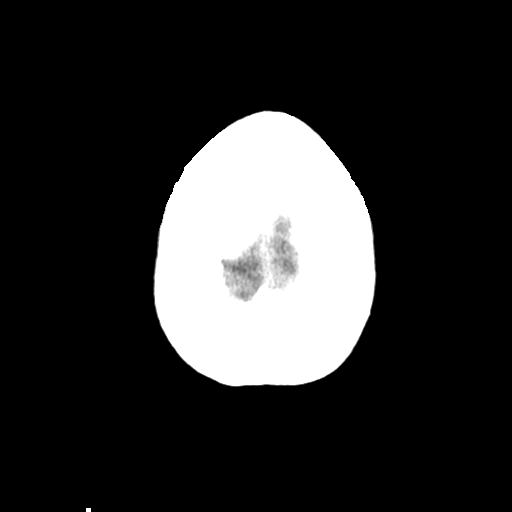

[Series 4: coronal soft · coronal · 0.32mm/px · 3 of 67 slices shown]
[im 23/67  brain]
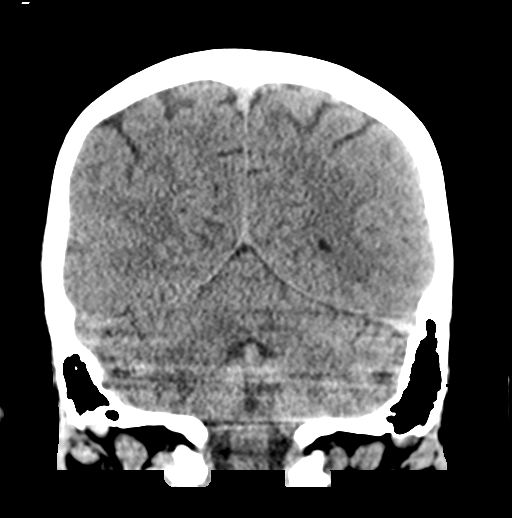
[im 30/67  brain]
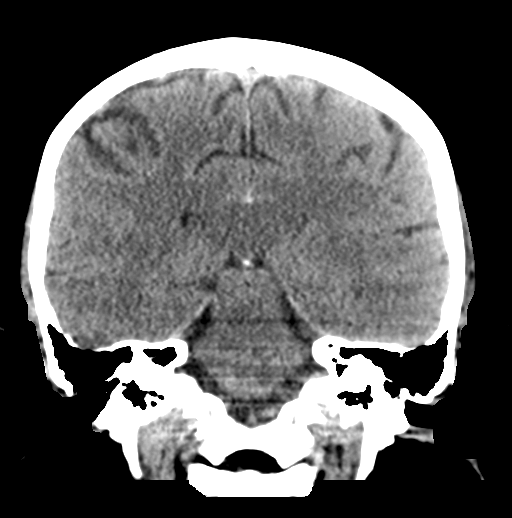
[im 37/67  brain]
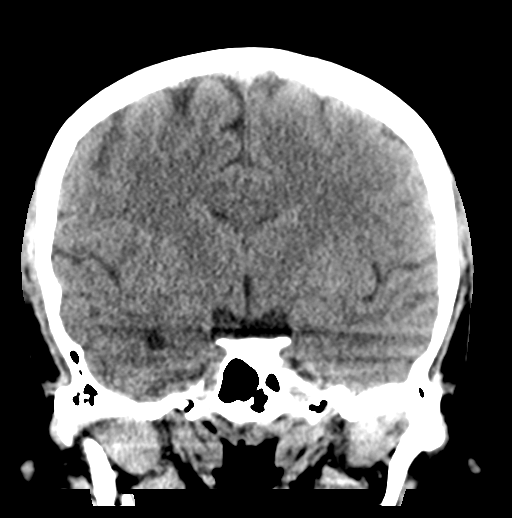

[Series 5: sagittal soft · sagittal · 0.35mm/px · 3 of 57 slices shown]
[im 19/57  brain]
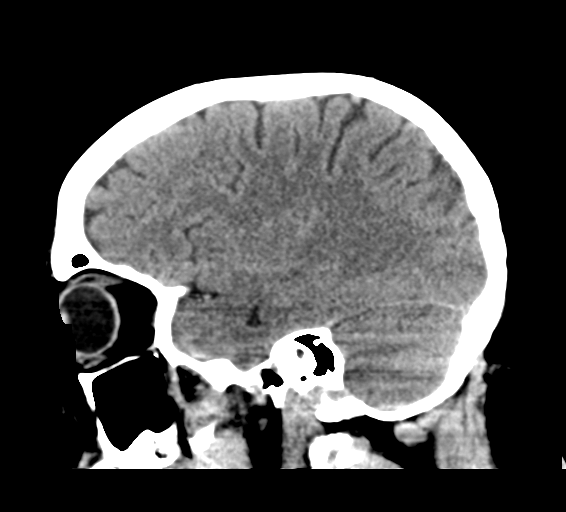
[im 29/57  brain]
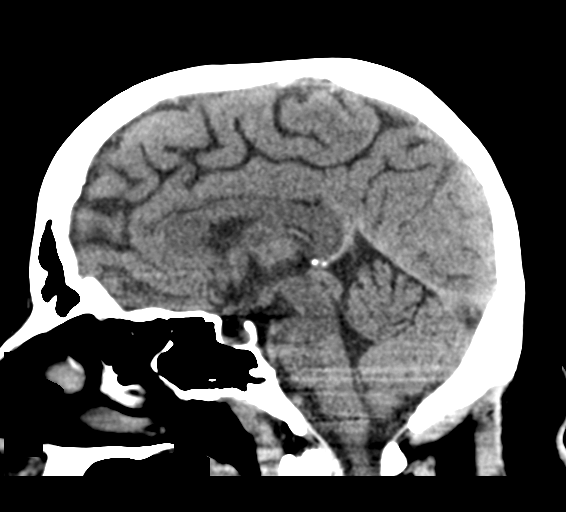
[im 38/57  brain]
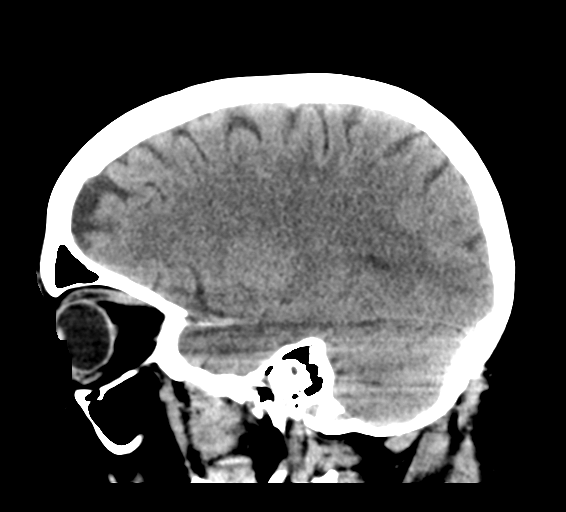

[16 of 47 positions shown; findings below may reference images not displayed]

FINDINGS: Brain:

Cerebral volume is normal.

There is no acute intracranial hemorrhage.

No demarcated cortical infarct.

No extra-axial fluid collection.

No evidence of an intracranial mass.

No midline shift.

Vascular: No hyperdense vessel.

Skull: No fracture or aggressive osseous lesion.

Sinuses/Orbits: No mass or acute finding within the imaged orbits.
No significant paranasal sinus disease.

ASPECTS ([place] Stroke Program Early CT Score)

- Ganglionic level infarction (caudate, lentiform nuclei, internal
capsule, insula, M1-M3 cortex): 7

- Supraganglionic infarction (M4-M6 cortex): 3

Total score (0-10 with 10 being normal): 10

These results were called by telephone at the time of interpretation
on [DATE] at [DATE] to provider Dr. NYA, who verbally
acknowledged these results.
IMPRESSION: No evidence of acute intracranial abnormality. ASPECTS is 10.

## 2021-09-14 IMAGING — MR MR HEAD W/O CM
8 of 9 series · 32 of 48 positions shown · non-contrast
Comparison: CT head from the same day.

CLINICAL DATA: Neuro deficit, acute, stroke suspected; Carotid
artery stenosis



[Series 5: DWI · axial · 4.0mm · 0.88mm/px · z∈[-91,+46]mm · 3 of 36 slices shown (1 of 6)]
[im 1/36]
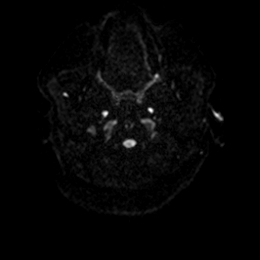
[im 18/36]
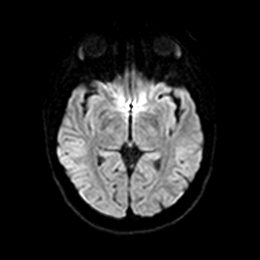
[im 36/36]
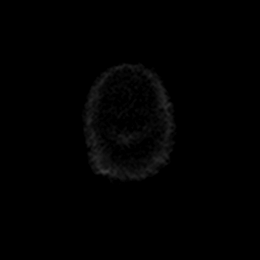

[Series 5: DWI · axial · 4.0mm · 0.88mm/px · z∈[-91,+46]mm · 3 of 36 slices shown (2 of 6)]
[im 1/36]
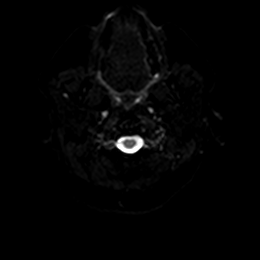
[im 18/36]
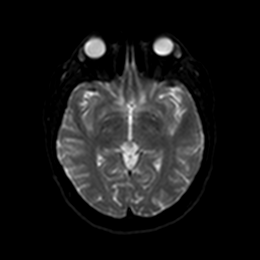
[im 36/36]
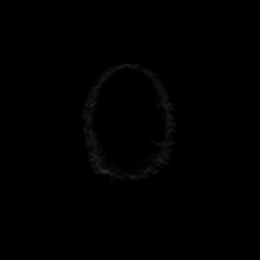

[Series 6: DWI · axial · 4.0mm · 0.88mm/px · z∈[-91,+46]mm · 3 of 36 slices shown (3 of 6)]
[im 1/36]
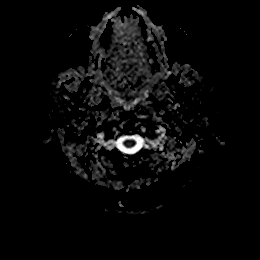
[im 18/36]
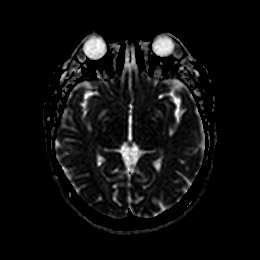
[im 36/36]
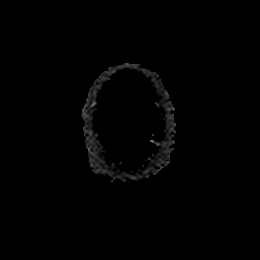

[Series 7: DWI · coronal · 5.0mm · 0.88mm/px · 2 of 28 slices shown (4 of 6)]
[im 1/28]
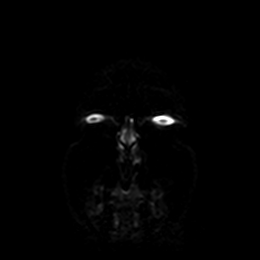
[im 28/28]
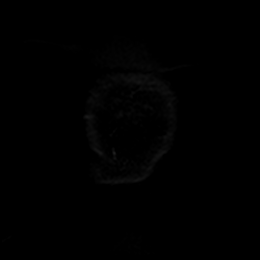

[Series 7: DWI · coronal · 5.0mm · 0.88mm/px · 2 of 28 slices shown (5 of 6)]
[im 1/28]
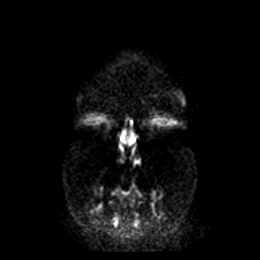
[im 28/28]
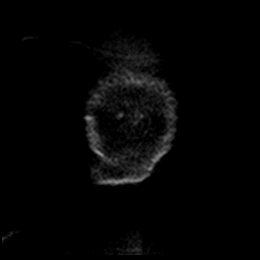

[Series 8: DWI · coronal · 5.0mm · 0.88mm/px · 2 of 28 slices shown (6 of 6)]
[im 1/28]
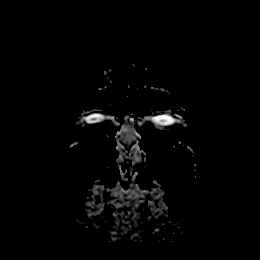
[im 28/28]
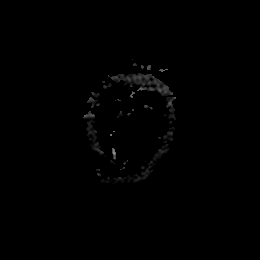

[Series 9: T1 · sagittal · 5.0mm · 0.94mm/px · 1 of 21 slices shown]
[im 1/21]
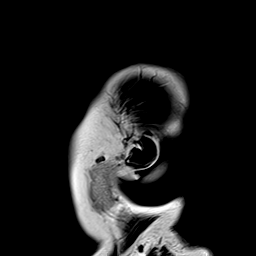

[Series 20: tof_fl3d_tra_iso · axial · 0.9mm · 0.86mm/px · z∈[-263,-55]mm · 16 of 232 slices shown]
[im 1/232]
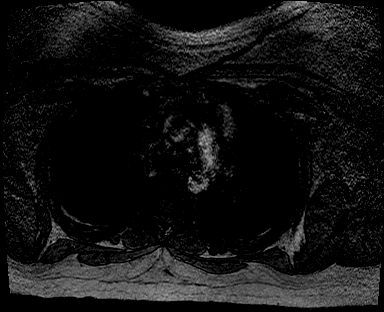
[im 16/232]
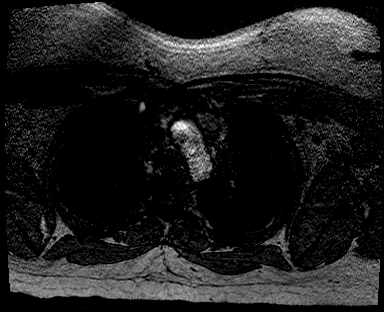
[im 31/232]
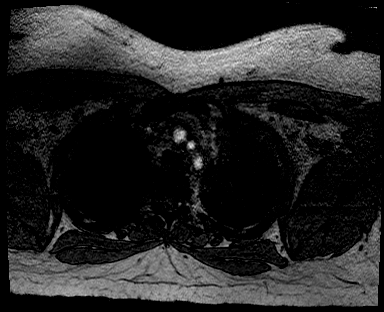
[im 47/232]
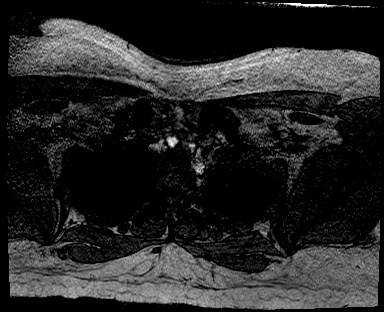
[im 62/232]
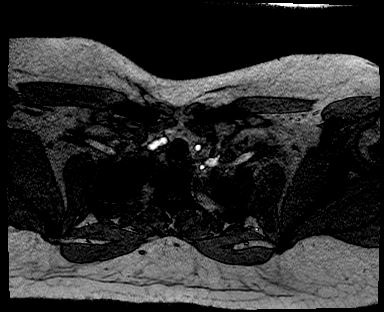
[im 78/232]
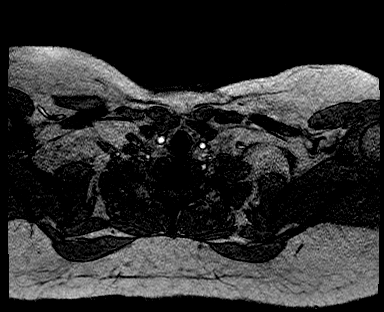
[im 93/232]
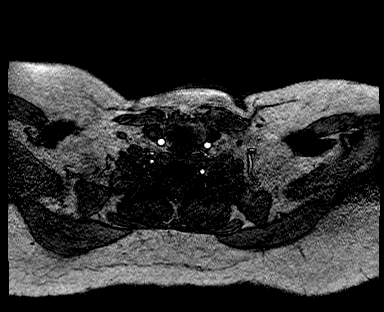
[im 108/232]
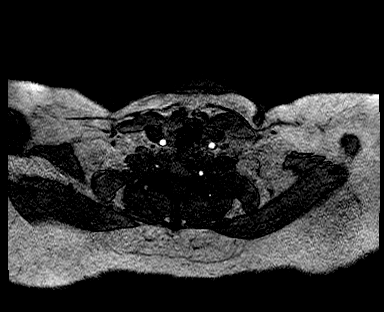
[im 124/232]
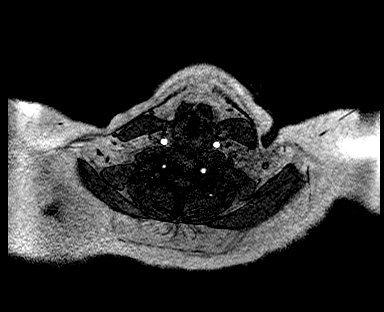
[im 139/232]
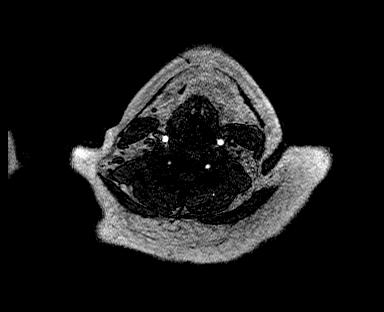
[im 155/232]
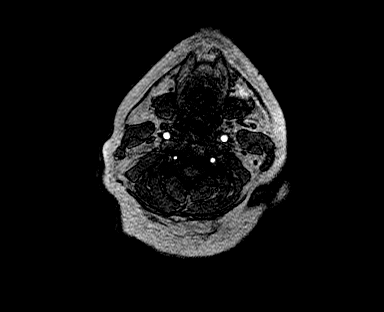
[im 170/232]
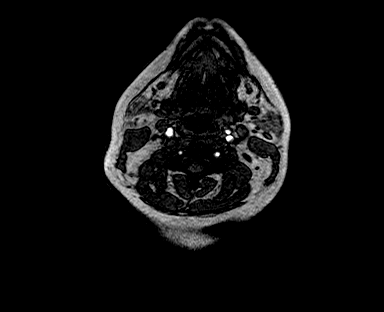
[im 185/232]
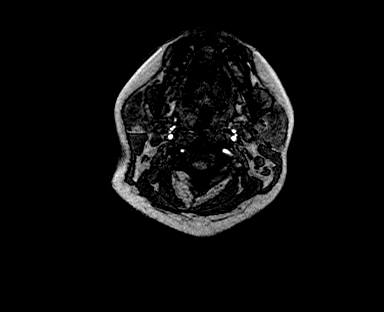
[im 201/232]
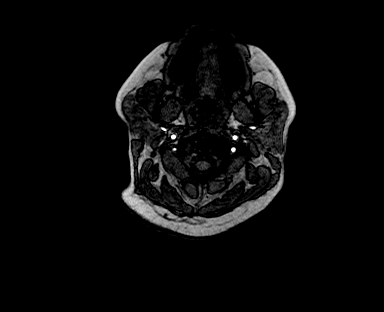
[im 216/232]
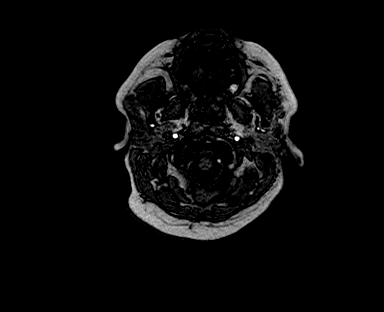
[im 232/232]
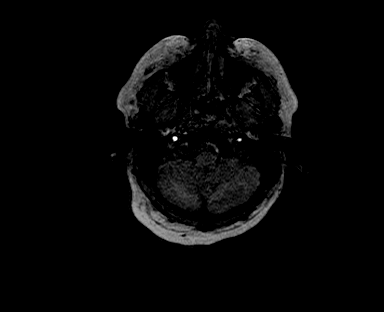

[32 of 48 positions shown; findings below may reference images not displayed]

FINDINGS: Limited exam.  Study was terminated early.

MRI HEAD FINDINGS

Limited assessment due to early termination of the exam. On the
DWI/ADC sequences, no evidence of acute infarct. No T2/FLAIR
sequence was performed, limiting assessment of the brain/white
matter. No obvious mass lesion, large hemorrhage, midline shift, or
hydrocephalus.

No focal marrow replacing lesion on limited assessment. Sinuses
appear largely clear. Limited assessment orbits without obvious
acute abnormality.

MRA HEAD FINDINGS

Motion limited MRA. The intracranial ICAs, MCAs, and ACAs are patent
without visible high-grade stenosis. The intradural vertebral
arteries, basilar artery and bilateral posterior cerebral arteries
are patent without visible high-grade stenosis.

MRA NECK FINDINGS

The common carotid arteries, internal carotid arteries are patent
without significant (greater than 50%) stenosis. The vertebral
arteries are patent without visible significant (greater than 50%)
stenosis. Left dominant vertebral artery.
IMPRESSION: Limited study with the patient terminating the exam early and the
MRA is limited by motion. Within these limitations, no evidence of
acute infarct, emergent large vessel occlusion, or proximal
high-grade stenosis.

## 2021-09-14 MED ORDER — ACETAMINOPHEN 325 MG PO TABS
650.0000 mg | ORAL_TABLET | Freq: Once | ORAL | Status: AC
  Administered 2021-09-14: 650 mg via ORAL
  Filled 2021-09-14: qty 2

## 2021-09-14 MED ORDER — MECLIZINE HCL 12.5 MG PO TABS
25.0000 mg | ORAL_TABLET | Freq: Once | ORAL | Status: AC
  Administered 2021-09-14: 25 mg via ORAL
  Filled 2021-09-14: qty 2

## 2021-09-14 MED ORDER — PROCHLORPERAZINE EDISYLATE 10 MG/2ML IJ SOLN
10.0000 mg | Freq: Once | INTRAMUSCULAR | Status: AC
  Administered 2021-09-14: 10 mg via INTRAVENOUS
  Filled 2021-09-14: qty 2

## 2021-09-14 MED ORDER — KETOROLAC TROMETHAMINE 30 MG/ML IJ SOLN
15.0000 mg | Freq: Once | INTRAMUSCULAR | Status: AC
  Administered 2021-09-14: 15 mg via INTRAVENOUS
  Filled 2021-09-14: qty 1

## 2021-09-14 MED ORDER — DIPHENHYDRAMINE HCL 50 MG/ML IJ SOLN
12.5000 mg | Freq: Once | INTRAMUSCULAR | Status: AC
  Administered 2021-09-14: 12.5 mg via INTRAVENOUS
  Filled 2021-09-14: qty 1

## 2021-09-14 NOTE — ED Notes (Signed)
Pt transported to MRI 

## 2021-09-14 NOTE — ED Provider Notes (Signed)
[facility] EMERGENCY DEPARTMENT Provider Note   CSN: [id number] Arrival date & time: 09/14/21  1519     History  Chief Complaint  Patient presents with   Loss of Consciousness    Martin[place] N Privitera is a 31 y.o. female with a history including GERD, asthma, PCOS with hyperinsulinemia presenting for evaluation of a syncopal event followed by left-sided weakness and intermittent slurred speech.  She was at work this morning around 7 AM when she started to have generalized weakness, lightheadedness and had a brief syncopal event after arriving here in our waiting room.  She was eased to the floor by her husband and did not hit her head.  He reports her eyes closed for a few seconds, once the triage nurse was present, she was awake and responding.   She states that she has a weak and numb feeling in her left arm which has been persistent since this morning's event.  She also reports left-sided chest pain, she denies shortness of breath.  She has had intermittent episodes of difficulty finding her words in association with garbled speech.  Denies prior history of similar symptoms.  She has had no treatment prior to arrival.  The history is provided by the patient and the spouse.      Home Medications Prior to Admission medications   Medication Sig Start Date End Date Taking? Authorizing Provider  albuterol (VENTOLIN HFA) 108 (90 Base) MCG/ACT inhaler Inhale 1-2 puffs into the lungs every 6 (six) hours as needed. 10/16/20   Valentina Shaggy, MD  cetirizine (ZYRTEC) 10 MG tablet TAKE 2 TABLETS BY MOUTH TWICE A DAY. 04/13/21   Valentina Shaggy, MD  EPINEPHrine (EPIPEN 2-PAK) 0.3 mg/0.3 mL IJ SOAJ injection Inject 0.3 mg into the muscle once as needed for anaphylaxis. 10/16/20   Valentina Shaggy, MD  esomeprazole (NEXIUM) 40 MG capsule Take 1 capsule (40 mg total) by mouth 2 (two) times daily before a meal. 06/30/21   Mahala Menghini, PA-C  Flaxseed, Linseed, (FLAXSEED OIL) 1200 MG CAPS  Take 1,200 mg by mouth 2 (two) times daily.    [provider]  fluticasone (FLOVENT HFA) 110 MCG/ACT inhaler Inhale 2 puffs into the lungs 2 (two) times daily. 08/02/21   Ambs, Kathrine Cords, FNP  ivabradine (CORLANOR) 7.5 MG TABS tablet Take 1 tablet (7.5 mg total) by mouth 2 (two) times daily with a meal. 08/27/21   Chandrasekhar, Terisa Starr, MD  levalbuterol (XOPENEX HFA) 45 MCG/ACT inhaler Inhale 2 puffs into the lungs every 4 (four) hours as needed for wheezing. 05/05/21   Valentina Shaggy, MD  levonorgestrel-ethinyl estradiol (SEASONALE) 0.15-0.03 MG tablet Take 1 tablet by mouth daily. 08/03/20   Roma Schanz, CNM  montelukast (SINGULAIR) 10 MG tablet Take 1 tablet (10 mg total) by mouth at bedtime. 08/02/21   Ambs, Kathrine Cords, FNP  promethazine (PHENERGAN) 25 MG tablet Take 1/2 to 1 tablet every six hours as needed for nausea or vomiting. Use sparingly. 06/30/21   Mahala Menghini, PA-C  topiramate (TOPAMAX) 25 MG capsule Take 50 mg by mouth 2 (two) times daily.    [provider]  Arvid Right 150 MG/ML prefilled syringe Inject into the skin. 01/25/21   [provider]      Allergies    Other, Shellfish allergy, Adhesive [tape], Eggs or egg-derived products, and Latex    Review of Systems   Review of Systems  Constitutional:  Negative for chills and fever.  HENT:  Negative  for congestion and sore throat.   Eyes: Negative.   Respiratory:  Negative for chest tightness and shortness of breath.   Cardiovascular:  Negative for chest pain.  Gastrointestinal:  Negative for abdominal pain, nausea and vomiting.  Genitourinary: Negative.   Musculoskeletal:  Negative for arthralgias, joint swelling and neck pain.  Skin: Negative.  Negative for rash and wound.  Neurological:  Positive for syncope, speech difficulty and weakness. Negative for dizziness, light-headedness, numbness and headaches.  Psychiatric/Behavioral: Negative.     Physical Exam Updated Vital Signs BP 128/73    Pulse 83   Temp 98.7 F (37.1 C) (Oral)   Resp 15   Ht '5\' 5"'$  (8.295 m)   Wt 112 kg   SpO2 98%   BMI 41.09 kg/m  Physical Exam Vitals and nursing note reviewed.  Constitutional:      Appearance: She is well-developed.     Comments: Tearful.  HENT:     Head: Normocephalic and atraumatic.  Eyes:     Extraocular Movements: Extraocular movements intact.     Conjunctiva/sclera: Conjunctivae normal.     Pupils: Pupils are equal, round, and reactive to light.  Cardiovascular:     Rate and Rhythm: Normal rate and regular rhythm.     Heart sounds: Normal heart sounds.  Pulmonary:     Effort: Pulmonary effort is normal.     Breath sounds: Normal breath sounds. No wheezing.  Abdominal:     General: Bowel sounds are normal.     Palpations: Abdomen is soft.     Tenderness: There is no abdominal tenderness. There is no guarding.  Musculoskeletal:        General: Normal range of motion.     Cervical back: Normal range of motion.  Skin:    General: Skin is warm and dry.  Neurological:     Mental Status: She is alert and oriented to person, place, and time.     Cranial Nerves: Dysarthria present. No cranial nerve deficit or facial asymmetry.     Deep Tendon Reflexes:     Reflex Scores:      Bicep reflexes are 2+ on the right side and 2+ on the left side.    Comments: Initially patient was speaking in full sentences without deficit, intermittently through the interview she would become tearful and have garbled speech which would clear after 15 or 20 seconds.  This occurred twice during my interview.  She can briefly raise both arms, but does not hold long enough to check for true pronator drift.  Grip strength is 4 out of 5 on the right, 3 out of 5 left.  Decreased sensation to fine touch left ulnar and radial forearm.  No foot drop.  Full sensation in lower extremities.    ED Results / Procedures / Treatments   Labs (all labs ordered are listed, but only abnormal results are  displayed) Labs Reviewed  COMPREHENSIVE METABOLIC PANEL - Abnormal; Notable for the following components:      Result Value   CO2 21 (*)    Glucose, Bld 118 (*)    ALT 57 (*)    Total Bilirubin 0.2 (*)    All other components within normal limits  URINALYSIS, ROUTINE W REFLEX MICROSCOPIC - Abnormal; Notable for the following components:   Color, Urine STRAW (*)    Hgb urine dipstick SMALL (*)    All other components within normal limits  CBC WITH DIFFERENTIAL/PLATELET - Abnormal; Notable for the following components:   WBC  14.1 (*)    Neutro Abs 10.0 (*)    All other components within normal limits  CBG MONITORING, ED - Abnormal; Notable for the following components:   Glucose-Capillary 232 (*)    All other components within normal limits  RESP PANEL BY RT-PCR (FLU A&B, COVID) ARPGX2  ETHANOL  PROTIME-INR  APTT  RAPID URINE DRUG SCREEN, HOSP PERFORMED  PREGNANCY, URINE  CBC  DIFFERENTIAL  I-STAT CHEM 8, ED    EKG EKG Interpretation  Date/Time:  Tuesday Sep 14 2021 17:15:18 EDT Ventricular Rate:  91 PR Interval:  165 QRS Duration: 92 QT Interval:  386 QTC Calculation: 475 R Axis:   47 Text Interpretation: Sinus rhythm Low voltage, precordial leads Since last tracing of earlier today Rate slower Otherwise no significant change Confirmed by Daleen Bo [phone]) on 09/14/2021 5:21:17 PM  Radiology MR ANGIO HEAD WO CONTRAST  Result Date: 09/14/2021 CLINICAL DATA:  Neuro deficit, acute, stroke suspected; Carotid artery stenosis EXAM: MRI HEAD WITHOUT CONTRAST MRA HEAD WITHOUT CONTRAST MRA NECK WITHOUT CONTRAST TECHNIQUE: Multiplanar, multiecho pulse sequences of the brain and surrounding structures were obtained without intravenous contrast. Angiographic images of the Circle of Willis were obtained using MRA technique without intravenous contrast. Angiographic images of the neck were obtained using MRA technique without intravenous contrast. Carotid stenosis measurements  (when applicable) are obtained utilizing NASCET criteria, using the distal internal carotid diameter as the denominator. COMPARISON:  CT head from the same day. FINDINGS: Limited exam.  Study was terminated early. MRI HEAD FINDINGS Limited assessment due to early termination of the exam. On the DWI/ADC sequences, no evidence of acute infarct. No T2/FLAIR sequence was performed, limiting assessment of the brain/white matter. No obvious mass lesion, large hemorrhage, midline shift, or hydrocephalus. No focal marrow replacing lesion on limited assessment. Sinuses appear largely clear. Limited assessment orbits without obvious acute abnormality. MRA HEAD FINDINGS Motion limited MRA. The intracranial ICAs, MCAs, and ACAs are patent without visible high-grade stenosis. The intradural vertebral arteries, basilar artery and bilateral posterior cerebral arteries are patent without visible high-grade stenosis. MRA NECK FINDINGS The common carotid arteries, internal carotid arteries are patent without significant (greater than 50%) stenosis. The vertebral arteries are patent without visible significant (greater than 50%) stenosis. Left dominant vertebral artery. IMPRESSION: Limited study with the patient terminating the exam early and the MRA is limited by motion. Within these limitations, no evidence of acute infarct, emergent large vessel occlusion, or proximal high-grade stenosis. Electronically Signed   By: Margaretha Sheffield M.D.   On: 09/14/2021 18:26   MR ANGIO NECK WO CONTRAST  Result Date: 09/14/2021 CLINICAL DATA:  Neuro deficit, acute, stroke suspected; Carotid artery stenosis EXAM: MRI HEAD WITHOUT CONTRAST MRA HEAD WITHOUT CONTRAST MRA NECK WITHOUT CONTRAST TECHNIQUE: Multiplanar, multiecho pulse sequences of the brain and surrounding structures were obtained without intravenous contrast. Angiographic images of the Circle of Willis were obtained using MRA technique without intravenous contrast. Angiographic  images of the neck were obtained using MRA technique without intravenous contrast. Carotid stenosis measurements (when applicable) are obtained utilizing NASCET criteria, using the distal internal carotid diameter as the denominator. COMPARISON:  CT head from the same day. FINDINGS: Limited exam.  Study was terminated early. MRI HEAD FINDINGS Limited assessment due to early termination of the exam. On the DWI/ADC sequences, no evidence of acute infarct. No T2/FLAIR sequence was performed, limiting assessment of the brain/white matter. No obvious mass lesion, large hemorrhage, midline shift, or hydrocephalus. No focal marrow replacing lesion on limited  assessment. Sinuses appear largely clear. Limited assessment orbits without obvious acute abnormality. MRA HEAD FINDINGS Motion limited MRA. The intracranial ICAs, MCAs, and ACAs are patent without visible high-grade stenosis. The intradural vertebral arteries, basilar artery and bilateral posterior cerebral arteries are patent without visible high-grade stenosis. MRA NECK FINDINGS The common carotid arteries, internal carotid arteries are patent without significant (greater than 50%) stenosis. The vertebral arteries are patent without visible significant (greater than 50%) stenosis. Left dominant vertebral artery. IMPRESSION: Limited study with the patient terminating the exam early and the MRA is limited by motion. Within these limitations, no evidence of acute infarct, emergent large vessel occlusion, or proximal high-grade stenosis. Electronically Signed   By: Margaretha Sheffield M.D.   On: 09/14/2021 18:26   MR BRAIN WO CONTRAST  Result Date: 09/14/2021 CLINICAL DATA:  Neuro deficit, acute, stroke suspected; Carotid artery stenosis EXAM: MRI HEAD WITHOUT CONTRAST MRA HEAD WITHOUT CONTRAST MRA NECK WITHOUT CONTRAST TECHNIQUE: Multiplanar, multiecho pulse sequences of the brain and surrounding structures were obtained without intravenous contrast. Angiographic  images of the Circle of Willis were obtained using MRA technique without intravenous contrast. Angiographic images of the neck were obtained using MRA technique without intravenous contrast. Carotid stenosis measurements (when applicable) are obtained utilizing NASCET criteria, using the distal internal carotid diameter as the denominator. COMPARISON:  CT head from the same day. FINDINGS: Limited exam.  Study was terminated early. MRI HEAD FINDINGS Limited assessment due to early termination of the exam. On the DWI/ADC sequences, no evidence of acute infarct. No T2/FLAIR sequence was performed, limiting assessment of the brain/white matter. No obvious mass lesion, large hemorrhage, midline shift, or hydrocephalus. No focal marrow replacing lesion on limited assessment. Sinuses appear largely clear. Limited assessment orbits without obvious acute abnormality. MRA HEAD FINDINGS Motion limited MRA. The intracranial ICAs, MCAs, and ACAs are patent without visible high-grade stenosis. The intradural vertebral arteries, basilar artery and bilateral posterior cerebral arteries are patent without visible high-grade stenosis. MRA NECK FINDINGS The common carotid arteries, internal carotid arteries are patent without significant (greater than 50%) stenosis. The vertebral arteries are patent without visible significant (greater than 50%) stenosis. Left dominant vertebral artery. IMPRESSION: Limited study with the patient terminating the exam early and the MRA is limited by motion. Within these limitations, no evidence of acute infarct, emergent large vessel occlusion, or proximal high-grade stenosis. Electronically Signed   By: Margaretha Sheffield M.D.   On: 09/14/2021 18:26   CT HEAD CODE STROKE WO CONTRAST  Result Date: 09/14/2021 CLINICAL DATA:  Code stroke. Neuro deficit, acute, stroke suspected. Syncopal episode. EXAM: CT HEAD WITHOUT CONTRAST TECHNIQUE: Contiguous axial images were obtained from the base of the skull  through the vertex without intravenous contrast. RADIATION DOSE REDUCTION: This exam was performed according to the departmental dose-optimization program which includes automated exposure control, adjustment of the mA and/or kV according to patient size and/or use of iterative reconstruction technique. COMPARISON:  Head CT 06/01/2015. FINDINGS: Brain: Cerebral volume is normal. There is no acute intracranial hemorrhage. No demarcated cortical infarct. No extra-axial fluid collection. No evidence of an intracranial mass. No midline shift. Vascular: No hyperdense vessel. Skull: No fracture or aggressive osseous lesion. Sinuses/Orbits: No mass or acute finding within the imaged orbits. No significant paranasal sinus disease. ASPECTS [facility] Stroke Program Early CT Score) - Ganglionic level infarction (caudate, lentiform nuclei, internal capsule, insula, M1-M3 cortex): 7 - Supraganglionic infarction (M4-M6 cortex): 3 Total score (0-10 with 10 being normal): 10 These results were called by  telephone at the time of interpretation on 09/14/2021 at 4:10 pm to provider Dr. Eulis Foster, who verbally acknowledged these results. IMPRESSION: No evidence of acute intracranial abnormality. ASPECTS is 10. Electronically Signed   By: Kellie Simmering D.O.   On: 09/14/2021 16:10    Procedures Procedures    Medications Ordered in ED Medications  meclizine (ANTIVERT) tablet 25 mg (has no administration in time range)  acetaminophen (TYLENOL) tablet 650 mg (has no administration in time range)  ketorolac (TORADOL) 30 MG/ML injection 15 mg (15 mg Intravenous Given 09/14/21 1709)  diphenhydrAMINE (BENADRYL) injection 12.5 mg (12.5 mg Intravenous Given 09/14/21 1709)  prochlorperazine (COMPAZINE) injection 10 mg (10 mg Intravenous Given 09/14/21 1709)    ED Course/ Medical Decision Making/ A&P                           Medical Decision Making Pt with syncopal event preceded by left sided weakness and intermittent slurred speech.   Code stoke initiated with Dr. Livia Snellen evaluation per teleneurology.  Suspects complicated migraine but full stroke work up initiated.  After MRI (incomplete studies unfortunately), she felt improved in the sense that she no longer had any left-sided weakness or difficulty speaking.  She did have continued mild headache and also describes continued dizziness which she states now feels like prior episodes of vertigo.  She has a room spinning sensation when she turns her head.  She has been on meclizine in the past.  She was ordered a dose of meclizine here.  Pending repeat consult with neurology given incomplete MRI testing.  Dr. Eulis Foster assumes care of patient at this time.  Amount and/or Complexity of Data Reviewed Labs: ordered. Radiology: ordered.  Risk OTC drugs. Prescription drug management.           Final Clinical Impression(s) / ED Diagnoses Final diagnoses:  Dizziness  Syncope, unspecified syncope type    Rx / DC Orders ED Discharge Orders     None         Landis Martins 09/14/21 1946    Daleen Bo, MD 09/15/21 [phone]

## 2021-09-14 NOTE — ED Provider Notes (Signed)
  Face-to-face evaluation   History: Patient presents for evaluation of pain in right arm weakness left arm, difficulty talking which started at 7 AM this morning.  She had apparently been awake prior to that, when the symptoms started  Physical exam: Obese alert cooperative.  No distinct dysarthria.  No clear evidence for aphasia.  Likely effort related weakness of the left arm.  No facial asymmetry.  No neglect.  MDM: Evaluation for  Chief Complaint  Patient presents with   Loss of Consciousness     -Initial discussion with the teleneurology service, intake person.  Support code stroke for now, until additional information is gathered.  3:45 PM  8:40 PM-she states her headache is almost gone and she has mild residual headache and vertigo which she has previously had.  She has no other complaints and wishes to go home.  I discussed the case with the neuro hospitalist, Dr. Dwyane Luo.  She looked at the images and is comfortable with the patient being discharged with symptomatic care.    Medical screening examination/treatment/procedure(s) were conducted as a shared visit with non-physician practitioner(s) and myself.  I personally evaluated the patient during the encounter    Daleen Bo, MD 09/15/21 [phone]

## 2021-09-14 NOTE — Progress Notes (Signed)
1538- Stroke cart activation for code stroke. 1545- Pt taken for CT 1552- Dr Quinn Axe on cart for neuro evaluation 1600- Pt returned from [place].

## 2021-09-14 NOTE — ED Notes (Signed)
Eulis Foster, MD at bedside discussing plan of care

## 2021-09-14 NOTE — ED Notes (Signed)
Pt ambulated to BR without problems

## 2021-09-14 NOTE — ED Notes (Signed)
Beeped and called out CODE STROKE '@1536'$ .

## 2021-09-14 NOTE — Consult Note (Signed)
NEUROLOGY TELECONSULTATION NOTE   Date of service: Sep 14, 2021 Patient Name: Dana Mcpherson MRN:  [id number] DOB:  June 16, 1990 Reason for consult: stroke code  Requesting Provider: Dr. Tonita Phoenix Consult Participants: myself, patient, bedside RN, telestroke RN Location of the provider: [facility] Location of the patient: [facility]  This consult was provided via telemedicine with 2-way video and audio communication. The patient/family was informed that care would be provided in this way and agreed to receive care in this manner.   _ _ _   _ __   _ __ _ _  __ __   _ __   __ _  History of Present Illness   31 yo woman with pmhx asthma, migraine, PCOS on whom stroke code activated in ED for L sided numbness and difficulty following neurologic exam. LKW 0700, then started feeling LH, generalized weakness, feeling "off." She may have had a syncopal episode earlier today, unclear from hx. She had significant speech delay when answering questions but answered orientation questions appropriately. She required extensive coaching during the exam for motor, but with coaching she had no drift in any extremity. She has a headache, which is currently 5/10 in severity, and peaked at 6/10. Head CT unremarkable (personal review). NIHSS = 3 for mild dysarthria, mildly impaired naming, and L sensory deficit. She does have a history of migraines but states this one feels different bc she is dizzy (LH > room spinning).    BP 182/97. Afebrile.    TNK not administered 2/2 presentation outside the window and nonfocal exam.   ROS   Per HPI; all other systems reviewed and are negative  Past History   The following was personally reviewed:  Past Medical History:  Diagnosis Date   Allergy    Anxiety    Asthma    Autoimmune disorder ([place])    Complication of anesthesia    woke up during surgery & ithing after surgery   Family history of adverse reaction to anesthesia    "My mom has the same trouble with anesthesia".    GERD (gastroesophageal reflux disease)    Migraine headache    Nexplanon in place 10/05/2015   06/29/15 left arm   Polycystic ovarian syndrome    Pregnancy induced hypertension    Tachycardia    Urticaria    Vaginal Pap smear, abnormal    Past Surgical History:  Procedure Laterality Date   BIOPSY  06/19/2019   Procedure: BIOPSY;  Surgeon: Daneil Dolin, MD;  [facility];  Service: Endoscopy;;   CHOLECYSTECTOMY  2010   COLONOSCOPY WITH PROPOFOL N/A 06/19/2019   Procedure: COLONOSCOPY WITH PROPOFOL;  Surgeon: Daneil Dolin, MD; 1 tubular adenoma, otherwise normal-appearing colonic mucosa, normal TI.  Stool sample for C. difficile negative.  Recommended repeat colonoscopy in 7 years.   ESOPHAGOGASTRODUODENOSCOPY     ESOPHAGOGASTRODUODENOSCOPY (EGD) WITH PROPOFOL N/A 04/08/2019   Procedure: ESOPHAGOGASTRODUODENOSCOPY (EGD) WITH PROPOFOL;  Surgeon: Daneil Dolin, MD; erosive reflux esophagitis s/p dilation, gastric mucosa congested diffusely with snakeskin appearance, antral erosions, congested eroded duodenal bulb, otherwise normal.   LAPAROSCOPIC BILATERAL SALPINGECTOMY Bilateral 10/28/2020   Procedure: LAPAROSCOPIC BILATERAL SALPINGECTOMY;  Surgeon: Florian Buff, MD;  Location: [facility];  Service: Gynecology;  Laterality: Bilateral;   MALONEY DILATION N/A 04/08/2019   Procedure: Venia Minks DILATION;  Surgeon: Daneil Dolin, MD;  Location: [facility];  Service: Endoscopy;  Laterality: N/A;   POLYPECTOMY  06/19/2019   Procedure: POLYPECTOMY;  Surgeon: Daneil Dolin,  MD;  [facility];  Service: Endoscopy;;  cecal polyp   Family History  Problem Relation Age of Onset   Cancer Paternal Grandmother        ovarian cancer   Other Mother        Ovarian Tumor   Hypertension Father    Asthma Son    ADD / ADHD Son    Asthma Son    Allergies Son    Colon cancer Other        late 46's   Diabetes Sister    Healthy Brother    Social History   Socioeconomic  History   Marital status: Married    Spouse name: Not on file   Number of children: Not on file   Years of education: Not on file   Highest education level: Not on file  Occupational History   Not on file  Tobacco Use   Smoking status: Former    Packs/day: 0.25    Years: 2.00    Pack years: 0.50    Types: Cigarettes    Quit date: 04/04/2010    Years since quitting: 11.4   Smokeless tobacco: Never  Vaping Use   Vaping Use: Never used  Substance and Sexual Activity   Alcohol use: No   Drug use: No   Sexual activity: Yes    Birth control/protection: Pill, Surgical  Other Topics Concern   Not on file  Social History Narrative   Not on file   Social Determinants of Health   Financial Resource Strain: Not on file  Food Insecurity: Not on file  Transportation Needs: Not on file  Physical Activity: Not on file  Stress: Not on file  Social Connections: Not on file   Allergies  Allergen Reactions   Other Anaphylaxis and Other (See Comments)    Pt states that she is allergic to Axe/Tag body spray and Lysol.    General anesthesia - pt woke up several times, was scratching during surgery. Itching   Allergy to sun and bug bites.   Shellfish Allergy Anaphylaxis and Hives   Adhesive [Tape] Hives   Eggs Or Egg-Derived Products Itching and Nausea Only   Latex Itching    Medications   (Not in a hospital admission)     Current Facility-Administered Medications:    omalizumab Arvid Right) prefilled syringe 300 mg, 300 mg, Subcutaneous, Q28 days, Ambs, Anne M, FNP, 300 mg at 09/08/21 1504  Current Outpatient Medications:    albuterol (VENTOLIN HFA) 108 (90 Base) MCG/ACT inhaler, Inhale 1-2 puffs into the lungs every 6 (six) hours as needed., Disp: 18 g, Rfl: 2   cetirizine (ZYRTEC) 10 MG tablet, TAKE 2 TABLETS BY MOUTH TWICE A DAY., Disp: 120 tablet, Rfl: 5   EPINEPHrine (EPIPEN 2-PAK) 0.3 mg/0.3 mL IJ SOAJ injection, Inject 0.3 mg into the muscle once as needed for  anaphylaxis., Disp: 2 each, Rfl: 1   esomeprazole (NEXIUM) 40 MG capsule, Take 1 capsule (40 mg total) by mouth 2 (two) times daily before a meal., Disp: 60 capsule, Rfl: 11   Flaxseed, Linseed, (FLAXSEED OIL) 1200 MG CAPS, Take 1,200 mg by mouth 2 (two) times daily., Disp: , Rfl:    fluticasone (FLOVENT HFA) 110 MCG/ACT inhaler, Inhale 2 puffs into the lungs 2 (two) times daily., Disp: 1 each, Rfl: 5   ivabradine (CORLANOR) 7.5 MG TABS tablet, Take 1 tablet (7.5 mg total) by mouth 2 (two) times daily with a meal., Disp: 60 tablet, Rfl: 0  levalbuterol (XOPENEX HFA) 45 MCG/ACT inhaler, Inhale 2 puffs into the lungs every 4 (four) hours as needed for wheezing., Disp: 1 each, Rfl: 2   levonorgestrel-ethinyl estradiol (SEASONALE) 0.15-0.03 MG tablet, Take 1 tablet by mouth daily., Disp: 91 tablet, Rfl: 3   montelukast (SINGULAIR) 10 MG tablet, Take 1 tablet (10 mg total) by mouth at bedtime., Disp: 30 tablet, Rfl: 5   promethazine (PHENERGAN) 25 MG tablet, Take 1/2 to 1 tablet every six hours as needed for nausea or vomiting. Use sparingly., Disp: 20 tablet, Rfl: 0   topiramate (TOPAMAX) 25 MG capsule, Take 50 mg by mouth 2 (two) times daily., Disp: , Rfl:    XOLAIR 150 MG/ML prefilled syringe, Inject into the skin., Disp: , Rfl:   Vitals   Vitals:   09/14/21 1530 09/14/21 1531 09/14/21 1620  BP: (!) 182/97    Pulse: 96    Resp: 14    Temp:   98.3 F (36.8 C)  TempSrc:   Oral  SpO2: 100%    Weight:  112 kg   Height:  '5\' 5"'$  (1.651 m)      Body mass index is 41.09 kg/m.  Physical Exam   Exam performed over telemedicine with 2-way video and audio communication and with assistance of bedside RN  Physical Exam Gen: A&O x4, NAD Resp: normal WOB CV: extremities appear well-perfused  Neuro: *MS: A&O x4. Follows simple commands *Speech: mild dysarthria, mildly impaired naming *CN: PERRL 49m, EOMI, VFF by confrontation, sensation intact, smile symmetric, hearing intact to voice *Motor:    Normal bulk.  No tremor, rigidity or bradykinesia. No pronator drift. All extremities appear full-strength and symmetric. *Sensory: L sided sensory deficit, No double-simultaneous extinction.  *Coordination:  Finger-to-nose, heel-to-shin, rapid alternating motions were intact. *Reflexes:  UTA 2/2 tele-exam *Gait: deferred  NIHSS = 3 for dysarthria, mild aphasia, and sensory deficit   Premorbid mRS = 0   Labs   CBC: No results for input(s): WBC, NEUTROABS, HGB, HCT, MCV, PLT in the last 168 hours.  Basic Metabolic Panel:  Lab Results  Component Value Date   NA 138 10/27/2020   K 3.8 10/27/2020   CO2 19 (L) 10/27/2020   GLUCOSE 98 10/27/2020   BUN 7 10/27/2020   CREATININE 0.72 10/27/2020   CALCIUM 8.6 (L) 10/27/2020   GFRNONAA >60 10/27/2020   GFRAA >60 05/19/2019   Lipid Panel: No results found for: LDLCALC HgbA1c:  Lab Results  Component Value Date   HGBA1C 4.9 03/03/2014   Urine Drug Screen:     Component Value Date/Time   LABOPIA NONE DETECTED 11/04/2014 2215   COCAINSCRNUR NONE DETECTED 11/04/2014 2215   COCAINSCRNUR NEG 04/22/2014 1117   LABBENZ NONE DETECTED 11/04/2014 2215   LABBENZ NEG 04/22/2014 1117   AMPHETMU NONE DETECTED 11/04/2014 2215   THCU NONE DETECTED 11/04/2014 2215   LABBARB NONE DETECTED 11/04/2014 2215    Alcohol Level No results found for: [place]  Impression   31yo woman with pmhx asthma, migraine, PCOS on whom stroke code activated in ED for L sided numbness and difficulty following neurologic exam. She reports LH since LKW at 0700 today, and headache 5/10. Exam notable for mild dysarthria, mildly impaired naming, and L sided sensory deficit. I suspect her deficits are 2/2 atypical migraine. Recommend MRI brain and MRA H&N; if negative and sx improve after migraine cocktail, no further neurologic workup indicated as inpatient.   Recommendations   - Migraine cocktail - MRI brain wo contrast -  MRA H&N - Syncope workup per ED. - If  MRI/MRA is negative and sx improve with migraine cocktail, no further inpatient neurologic workup indicated. Neurology available prn for questions. ______________________________________________________________________   Thank you for the opportunity to take part in the care of this patient. If you have any further questions, please contact the neurology consultation attending.  Signed,  Su Monks, MD [facility] [phone]  If 7pm- 7am, please page neurology on call as listed in [place].

## 2021-09-14 NOTE — ED Notes (Signed)
Pt passed stroke swallow screen  

## 2021-09-14 NOTE — ED Triage Notes (Signed)
Pt was in line in waiting room and had syncopal episode, taken to room and pt having trouble with neuro assessment.

## 2021-09-14 NOTE — Discharge Instructions (Signed)
We do not think that you have had a stroke.  Continue your current treatment for migraines.  Return here or see your doctor as needed for problems.

## 2021-09-15 NOTE — Progress Notes (Signed)
CODE STROKE 1538 call time 1539 beeper 1600 exam started 1601 exam ended 1601 images sent to [facility] exam completed in [facility]

## 2021-09-22 NOTE — Progress Notes (Unsigned)
Referring:  Lanelle Bal, PA-C [place],  [place] [address]  PCP: Curlene Labrum, MD  Neurology was asked to evaluate Dana Mcpherson, a 31 year old female for a chief complaint of headaches.  Our recommendations of care will be communicated by shared medical record.    CC:  headaches  History provided from self, husband  HPI:  Medical co-morbidities: asthma, migraine, PCOS  The patient presents for evaluation of headaches, dizziness, and numbness. On 09/14/21 she woke up and her head felt "funny" and she felt generally weak. Took 6 ibuprofen which did not help her headache. Went to the hospital for evaluation, and while there she developed developed weakness and numbness in her left arm and leg. States at one point she was shaking all over. She was conscious during the episode of shaking and states people kept asking her if she was cold.  In the ED she lost consciousness for a couple of seconds. Was not confused when she woke back up. Felt like the room was spinning and she felt nauseated. MRI/MRA was motion degraded, but appeared unremarkable. Weakness and numbness lasted for a couple of hours. Notes that she was under a lot of stress at the time. 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 She had not eaten and had not gotten much sleep.  Since her hospitalization she continues to have brain fog, nausea, vertigo, and a "funny feeling" in her head. Slipped and fell this morning.  Has a history of migraines and takes Topamax 50 mg BID. This does not cause side effects. She does not take anything for rescue.  Headache History: Onset: Sep 14, 2021 Triggers: stress Aura: none Location: forehead Quality/Description: pressure Associated Symptoms:  Photophobia: yes  Phonophobia: no  Nausea: yes Other symptoms: vertigo Worse with activity?: yes Duration of headaches: constant   Headache days per month: 30 Headache free days per  month: 0  Current Treatment: Abortive ibuprofen  Preventative Topamax 50 mg BID  Prior Therapies                                 Topamax 50 mg BID   LABS: CBC    Component Value Date/Time   WBC 14.1 (H) 09/14/2021 1631   RBC 4.87 09/14/2021 1631   HGB 13.8 09/14/2021 1631   HGB 12.2 09/09/2014 0902   HCT 42.9 09/14/2021 1631   HCT 35.3 09/09/2014 0902   PLT 356 09/14/2021 1631   PLT 371 09/09/2014 0902   MCV 88.1 09/14/2021 1631   MCV 87 09/09/2014 0902   MCH 28.3 09/14/2021 1631   MCHC 32.2 09/14/2021 1631   RDW 13.8 09/14/2021 1631   RDW 14.4 09/09/2014 0902   LYMPHSABS 3.0 09/14/2021 1631   MONOABS 0.9 09/14/2021 1631   EOSABS 0.1 09/14/2021 1631   BASOSABS 0.1 09/14/2021 1631      Latest Ref Rng & Units 09/14/2021    4:31 PM 10/27/2020    7:59 AM 08/11/2020   10:59 AM  CMP  Glucose 70 - 99 mg/dL 118   98   84    BUN 6 - 20 mg/dL '9   7   10    '$ Creatinine 0.44 - 1.00 mg/dL 0.91   0.72   0.81    Sodium 135 - 145 mmol/L 138   138   138    Potassium 3.5 -  5.1 mmol/L 3.5   3.8   4.2    Chloride 98 - 111 mmol/L 109   110   106    CO2 22 - 32 mmol/L '21   19   24    '$ Calcium 8.9 - 10.3 mg/dL 9.0   8.6   9.0    Total Protein 6.5 - 8.1 g/dL 8.1   7.6     Total Bilirubin 0.3 - 1.2 mg/dL 0.2   0.4     Alkaline Phos 38 - 126 U/L 83   62     AST 15 - 41 U/L 33   29     ALT 0 - 44 U/L 57   42        IMAGING:  MRI/MRA brain 09/14/21: Limited study with the patient terminating the exam early and the MRA is limited by motion. Within these limitations, no evidence of acute infarct, emergent large vessel occlusion, or proximal high-grade stenosis.  Imaging independently reviewed on September 23, 2021   Current Outpatient Medications on File Prior to Visit  Medication Sig Dispense Refill   albuterol (VENTOLIN HFA) 108 (90 Base) MCG/ACT inhaler Inhale 1-2 puffs into the lungs every 6 (six) hours as needed. 18 g 2   cetirizine (ZYRTEC) 10 MG tablet TAKE 2 TABLETS BY MOUTH TWICE  A DAY. 120 tablet 5   EPINEPHrine (EPIPEN 2-PAK) 0.3 mg/0.3 mL IJ SOAJ injection Inject 0.3 mg into the muscle once as needed for anaphylaxis. 2 each 1   esomeprazole (NEXIUM) 40 MG capsule Take 1 capsule (40 mg total) by mouth 2 (two) times daily before a meal. 60 capsule 11   Flaxseed, Linseed, (FLAXSEED OIL) 1200 MG CAPS Take 1,200 mg by mouth 2 (two) times daily.     fluticasone (FLOVENT HFA) 110 MCG/ACT inhaler Inhale 2 puffs into the lungs 2 (two) times daily. 1 each 5   ivabradine (CORLANOR) 7.5 MG TABS tablet Take 1 tablet (7.5 mg total) by mouth 2 (two) times daily with a meal. 60 tablet 0   levalbuterol (XOPENEX HFA) 45 MCG/ACT inhaler Inhale 2 puffs into the lungs every 4 (four) hours as needed for wheezing. 1 each 2   levonorgestrel-ethinyl estradiol (SEASONALE) 0.15-0.03 MG tablet Take 1 tablet by mouth daily. 91 tablet 3   montelukast (SINGULAIR) 10 MG tablet Take 1 tablet (10 mg total) by mouth at bedtime. 30 tablet 5   promethazine (PHENERGAN) 25 MG tablet Take 1/2 to 1 tablet every six hours as needed for nausea or vomiting. Use sparingly. 20 tablet 0   XOLAIR 150 MG/ML prefilled syringe Inject into the skin.     Current Facility-Administered Medications on File Prior to Visit  Medication Dose Route Frequency Provider Last Rate Last Admin   omalizumab Arvid Right) prefilled syringe 300 mg  300 mg Subcutaneous Q28 days Dara Hoyer, FNP   300 mg at 09/08/21 1504     Allergies: Allergies  Allergen Reactions   Other Anaphylaxis and Other (See Comments)    Pt states that she is allergic to Axe/Tag body spray and Lysol.    General anesthesia - pt woke up several times, was scratching during surgery. Itching   Allergy to sun and bug bites.   Shellfish Allergy Anaphylaxis and Hives   Adhesive [Tape] Hives   Eggs Or Egg-Derived Products Itching and Nausea Only   Latex Itching    Family History: Migraine or other headaches in the family:  mother, sister, grandmother Aneurysms  in a first degree relative:  no Brain tumors in the family:  no Other neurological illness in the family:   no  Past Medical History: Past Medical History:  Diagnosis Date   Allergy    Anxiety    Asthma    Autoimmune disorder ([place])    Complication of anesthesia    woke up during surgery & ithing after surgery   Family history of adverse reaction to anesthesia    "My mom has the same trouble with anesthesia".   GERD (gastroesophageal reflux disease)    Migraine headache    Nexplanon in place 10/05/2015   06/29/15 left arm   Polycystic ovarian syndrome    Pregnancy induced hypertension    Tachycardia    Urticaria    Vaginal Pap smear, abnormal     Past Surgical History Past Surgical History:  Procedure Laterality Date   BIOPSY  06/19/2019   Procedure: BIOPSY;  Surgeon: Daneil Dolin, MD;  [facility];  Service: Endoscopy;;   CHOLECYSTECTOMY  2010   COLONOSCOPY WITH PROPOFOL N/A 06/19/2019   Procedure: COLONOSCOPY WITH PROPOFOL;  Surgeon: Daneil Dolin, MD; 1 tubular adenoma, otherwise normal-appearing colonic mucosa, normal TI.  Stool sample for C. difficile negative.  Recommended repeat colonoscopy in 7 years.   ESOPHAGOGASTRODUODENOSCOPY     ESOPHAGOGASTRODUODENOSCOPY (EGD) WITH PROPOFOL N/A 04/08/2019   Procedure: ESOPHAGOGASTRODUODENOSCOPY (EGD) WITH PROPOFOL;  Surgeon: Daneil Dolin, MD; erosive reflux esophagitis s/p dilation, gastric mucosa congested diffusely with snakeskin appearance, antral erosions, congested eroded duodenal bulb, otherwise normal.   LAPAROSCOPIC BILATERAL SALPINGECTOMY Bilateral 10/28/2020   Procedure: LAPAROSCOPIC BILATERAL SALPINGECTOMY;  Surgeon: Florian Buff, MD;  Location: [facility];  Service: Gynecology;  Laterality: Bilateral;   MALONEY DILATION N/A 04/08/2019   Procedure: Venia Minks DILATION;  Surgeon: Daneil Dolin, MD;  [facility];  Service: Endoscopy;  Laterality: N/A;   POLYPECTOMY  06/19/2019   Procedure:  POLYPECTOMY;  Surgeon: Daneil Dolin, MD;  [facility];  Service: Endoscopy;;  cecal polyp    Social History: Social History   Tobacco Use   Smoking status: Former    Packs/day: 0.25    Years: 2.00    Pack years: 0.50    Types: Cigarettes    Quit date: 04/04/2010    Years since quitting: 11.4   Smokeless tobacco: Never  Vaping Use   Vaping Use: Never used  Substance Use Topics   Alcohol use: No   Drug use: No    ROS: Negative for fevers, chills. Positive for headaches, weakness, numbness. All other systems reviewed and negative unless stated otherwise in HPI.   Physical Exam:   Vital Signs: BP 129/83   Pulse 85   Ht 5' 6.5" (1.689 m)   Wt 247 lb (112 kg)   BMI 39.27 kg/m  GENERAL: well appearing,in no acute distress,alert SKIN:  Color, texture, turgor normal. No rashes or lesions HEAD:  Normocephalic/atraumatic. CV:  RRR RESP: Normal respiratory effort MSK: +tenderness to palpation over bilateral temples, occiput, neck, and shoulders  NEUROLOGICAL: Mental Status: Alert, oriented to person, place and time,Follows commands Cranial Nerves: PERRL, visual fields intact to confrontation, extraocular movements intact, facial sensation intact, no facial droop or ptosis, hearing grossly intact, no dysarthria Motor: muscle strength 5/5 both upper and lower extremities Reflexes: 2+ throughout Sensation: diminished sensation to light touch LLE Coordination: Slowed finger-to- nose-finger bilaterally without dysmetria Gait: normal-based   IMPRESSION: 31 year old female with a history of asthma, migraine, and PCOS who presents for evaluation  of episode of headache, vertigo, and left sided numbness/weakness. MRI/MRA brain were unremarkable. Her presentation is most consistent with migraine with aura, with current status migrainosus. She would prefer to avoid Toradol or steroids if possible. Will prescribe 5 day course of muscle relaxer to help break her headache  cycle, and increase Topamax to 50/75. Triptans are contraindicated with hemiplegic migraine. Will start Nurtec for rescue.   PLAN: -Robaxin 500 mg up to 3 times a day x5 days -Preventive: Increase Topamax to 50 mg in AM and 75 mg in PM -Rescue: Start Nurtec 75 mg PRN   I spent a total of 44 minutes chart reviewing and counseling the patient. Headache education was done. Discussed treatment options including preventive and acute medications. Discussed medication side effects, adverse reactions and drug interactions. Written educational materials and patient instructions outlining all of the above were given.  Follow-up: 5 months   Genia Harold, MD 09/23/2021   10:08 AM

## 2021-09-23 ENCOUNTER — Encounter: Payer: Self-pay | Admitting: Psychiatry

## 2021-09-23 ENCOUNTER — Ambulatory Visit: Payer: 59 | Admitting: Psychiatry

## 2021-09-23 VITALS — BP 129/83 | HR 85 | Ht 66.5 in | Wt 247.0 lb

## 2021-09-23 DIAGNOSIS — G43401 Hemiplegic migraine, not intractable, with status migrainosus: Secondary | ICD-10-CM | POA: Diagnosis not present

## 2021-09-23 MED ORDER — TOPIRAMATE 25 MG PO CPSP: ORAL_CAPSULE | ORAL | 6 refills | Status: DC

## 2021-09-23 MED ORDER — METHOCARBAMOL 500 MG PO TABS: 500.0000 mg | ORAL_TABLET | Freq: Three times a day (TID) | ORAL | 0 refills | Status: DC | PRN

## 2021-09-23 MED ORDER — NURTEC 75 MG PO TBDP: 75.0000 mg | ORAL_TABLET | ORAL | 6 refills | Status: DC | PRN

## 2021-09-23 NOTE — Patient Instructions (Addendum)
Increase Topamax to 50 mg (2 pills) in the morning and 75 mg (3 pills) at bedtime Take robaxin (muscle relaxer) up to 3 times a day for 5 days Start Nurtec 75 mg as needed for migraines. Take one pill at the onset of migraine. Max dose 1 pill in 24 hours.

## 2021-09-26 ENCOUNTER — Other Ambulatory Visit: Payer: Self-pay | Admitting: Women's Health

## 2021-09-26 ENCOUNTER — Other Ambulatory Visit: Payer: Self-pay | Admitting: Internal Medicine

## 2021-09-27 ENCOUNTER — Telehealth: Payer: Self-pay | Admitting: Women's Health

## 2021-09-27 ENCOUNTER — Telehealth: Payer: Self-pay | Admitting: Internal Medicine

## 2021-09-27 ENCOUNTER — Other Ambulatory Visit: Payer: Self-pay | Admitting: *Deleted

## 2021-09-27 MED ORDER — IVABRADINE HCL 7.5 MG PO TABS: 7.5000 mg | ORAL_TABLET | Freq: Two times a day (BID) | ORAL | 1 refills | Status: DC

## 2021-09-27 NOTE — Telephone Encounter (Signed)
Patient called to check status of birth control refill. Please advise.

## 2021-09-27 NOTE — Telephone Encounter (Signed)
*  STAT* If patient is at the pharmacy, call can be transferred to refill team.   1. Which medications need to be refilled? (please list name of each medication and dose if known)   ivabradine (CORLANOR) 7.5 MG TABS tablet   2. Which pharmacy/location (including street and city if local pharmacy) is medication to be sent to? [place], [address]  3. Do they need a 30 day or 90 day supply?  30 day   Pt is completely out of medication. Pt has appt scheduled for 10/22/21. Please advise

## 2021-09-27 NOTE — Telephone Encounter (Signed)
Outpatient Medication Detail   Disp Refills Start End   ivabradine (CORLANOR) 7.5 MG TABS tablet 60 tablet 1 09/27/2021    Sig - Route: Take 1 tablet (7.5 mg total) by mouth 2 (two) times daily with a meal. - Oral   Sent to pharmacy as: ivabradine (CORLANOR) 7.5 MG Tab tablet   E-Prescribing Status: Receipt confirmed by pharmacy (09/27/2021 11:58 AM EDT)    Pharmacy  [place], [place]

## 2021-09-27 NOTE — Telephone Encounter (Signed)
Completed.

## 2021-10-11 ENCOUNTER — Ambulatory Visit: Payer: 59

## 2021-10-11 ENCOUNTER — Telehealth: Payer: Self-pay | Admitting: Family Medicine

## 2021-10-11 MED ORDER — CETIRIZINE HCL 10 MG PO TABS: 10.0000 mg | ORAL_TABLET | Freq: Two times a day (BID) | ORAL | 2 refills | Status: DC

## 2021-10-11 NOTE — Telephone Encounter (Signed)
Patient called requesting refill for her cetirizine. Was told by pharmacy it needed to be approved by the provider.   [facility]

## 2021-10-11 NOTE — Telephone Encounter (Signed)
I have sent over her refill to provided pharmacy. Called and informed that the refill has been approved. Patient verbalized understanding.

## 2021-10-11 NOTE — Telephone Encounter (Signed)
Okay to refill cetirizine please

## 2021-10-18 ENCOUNTER — Ambulatory Visit (INDEPENDENT_AMBULATORY_CARE_PROVIDER_SITE_OTHER): Payer: 59

## 2021-10-18 DIAGNOSIS — L501 Idiopathic urticaria: Secondary | ICD-10-CM | POA: Diagnosis not present

## 2021-10-20 NOTE — Progress Notes (Signed)
Cardiology Office Note:    Date:  10/22/2021   ID:  Dana Mcpherson, DOB [birth year], MRN [id number]  PCP:  Rosine Door   [facility] Providers Cardiologist:  None     Referring MD: Curlene Labrum, MD   CC: inappropriate sinus tachycardia Consulted for the evaluation of re-establishing care at the behest of Dr. Pleas Koch  History of Present Illness:    Dana Mcpherson is a 31 y.o. female with a hx of PCOS. Hx of inappropriate sinus tachycardia since 2016 seen by Dr. Raliegh Ip in the past, on Corlanor that has done well.  Patient notes that she is doing ok now.   She had inappropriate sinus tachycardia with pregnancy that did not resolve after pregnancy. Negative inflammatory work up, normal LV function, no evidence of ischemia.  Has strong family history of inappropriate sinus tachycardia. Had hypotension with BB. Did not try CCB that she remembers. There are no interval hospital/ED visit.    No chest pain or pressure.  No SOB/DOE and no PND/Orthopnea.  No weight gain or leg swelling.  No palpitations unless she forget to take her medications or if she gets steroids (needed for chronic hives, HR does ok with steroid injections), worse with her rescue inhaler. No syncope.  Past Medical History:  Diagnosis Date   Allergy    Anxiety    Asthma    Autoimmune disorder ([place])    Complication of anesthesia    woke up during surgery & ithing after surgery   Family history of adverse reaction to anesthesia    "My mom has the same trouble with anesthesia".   GERD (gastroesophageal reflux disease)    Migraine headache    Nexplanon in place 10/05/2015   06/29/15 left arm   Polycystic ovarian syndrome    Pregnancy induced hypertension    Tachycardia    Urticaria    Vaginal Pap smear, abnormal     Past Surgical History:  Procedure Laterality Date   BIOPSY  06/19/2019   Procedure: BIOPSY;  Surgeon: Daneil Dolin, MD;  [facility];  Service: Endoscopy;;    CHOLECYSTECTOMY  2010   COLONOSCOPY WITH PROPOFOL N/A 06/19/2019   Procedure: COLONOSCOPY WITH PROPOFOL;  Surgeon: Daneil Dolin, MD; 1 tubular adenoma, otherwise normal-appearing colonic mucosa, normal TI.  Stool sample for C. difficile negative.  Recommended repeat colonoscopy in 7 years.   ESOPHAGOGASTRODUODENOSCOPY     ESOPHAGOGASTRODUODENOSCOPY (EGD) WITH PROPOFOL N/A 04/08/2019   Procedure: ESOPHAGOGASTRODUODENOSCOPY (EGD) WITH PROPOFOL;  Surgeon: Daneil Dolin, MD; erosive reflux esophagitis s/p dilation, gastric mucosa congested diffusely with snakeskin appearance, antral erosions, congested eroded duodenal bulb, otherwise normal.   LAPAROSCOPIC BILATERAL SALPINGECTOMY Bilateral 10/28/2020   Procedure: LAPAROSCOPIC BILATERAL SALPINGECTOMY;  Surgeon: Florian Buff, MD;  Location: [facility] ORS;  Service: Gynecology;  Laterality: Bilateral;   MALONEY DILATION N/A 04/08/2019   Procedure: Venia Minks DILATION;  Surgeon: Daneil Dolin, MD;  Location: [facility];  Service: Endoscopy;  Laterality: N/A;   POLYPECTOMY  06/19/2019   Procedure: POLYPECTOMY;  Surgeon: Daneil Dolin, MD;  [facility]: [facility];  Service: Endoscopy;;  cecal polyp    Current Medications: Current Meds  Medication Sig   albuterol (VENTOLIN HFA) 108 (90 Base) MCG/ACT inhaler Inhale 1-2 puffs into the lungs every 6 (six) hours as needed.   cetirizine (ZYRTEC) 10 MG tablet Take 1 tablet (10 mg total) by mouth 2 (two) times daily. TAKE 2 TABLETS BY MOUTH TWICE A DAY.  EPINEPHrine (EPIPEN 2-PAK) 0.3 mg/0.3 mL IJ SOAJ injection Inject 0.3 mg into the muscle once as needed for anaphylaxis.   esomeprazole (NEXIUM) 40 MG capsule Take 1 capsule (40 mg total) by mouth 2 (two) times daily before a meal.   Flaxseed, Linseed, (FLAXSEED OIL) 1200 MG CAPS Take 1,200 mg by mouth 2 (two) times daily.   levonorgestrel-ethinyl estradiol (SEASONALE) 0.15-0.03 MG tablet TAKE ONE TABLET BY MOUTH ONCE DAILY.   methocarbamol (ROBAXIN) 500 MG  tablet Take 1 tablet (500 mg total) by mouth every 8 (eight) hours as needed for muscle spasms.   montelukast (SINGULAIR) 10 MG tablet Take 1 tablet (10 mg total) by mouth at bedtime.   promethazine (PHENERGAN) 25 MG tablet Take 1/2 to 1 tablet every six hours as needed for nausea or vomiting. Use sparingly.   Rimegepant Sulfate (NURTEC) 75 MG TBDP Take 75 mg by mouth as needed (for migraine). Max dose 1 pill in 24 hours   topiramate (TOPAMAX) 25 MG capsule Take 50 mg (2 pills) in the morning and 75 mg (3 pills) at bedtime   XOLAIR 150 MG/ML prefilled syringe Inject into the skin.   [DISCONTINUED] ivabradine (CORLANOR) 7.5 MG TABS tablet Take 1 tablet (7.5 mg total) by mouth 2 (two) times daily with a meal.   Current Facility-Administered Medications for the 10/22/21 encounter (Office Visit) with Werner Lean, MD  Medication   omalizumab Arvid Right) prefilled syringe 300 mg     Allergies:   Other, Shellfish allergy, Adhesive [tape], Eggs or egg-derived products, and Latex   Social History   Socioeconomic History   Marital status: Married    Spouse name: Not on file   Number of children: Not on file   Years of education: Not on file   Highest education level: Not on file  Occupational History   Not on file  Tobacco Use   Smoking status: Former    Packs/day: 0.25    Years: 2.00    Total pack years: 0.50    Types: Cigarettes    Quit date: 04/04/2010    Years since quitting: 11.5   Smokeless tobacco: Never  Vaping Use   Vaping Use: Never used  Substance and Sexual Activity   Alcohol use: No   Drug use: No   Sexual activity: Yes    Birth control/protection: Pill, Surgical  Other Topics Concern   Not on file  Social History Narrative   Right handed    Caffeine- none   Social Determinants of Health   Financial Resource Strain: Low Risk  (08/03/2020)   Overall Financial Resource Strain (CARDIA)    Difficulty of Paying Living Expenses: Not hard at all  Food  Insecurity: No Food Insecurity (08/03/2020)   Hunger Vital Sign    Worried About Running Out of Food in the Last Year: Never true    [place] in the Last Year: Never true  Transportation Needs: No Transportation Needs (08/03/2020)   PRAPARE - Hydrologist (Medical): No    Lack of Transportation (Non-Medical): No  Physical Activity: Unknown (08/03/2020)   Exercise Vital Sign    Days of Exercise per Week: Patient refused    Minutes of Exercise per Session: Patient refused  Stress: Stress Concern Present (08/03/2020)   [place]    Feeling of Stress : To some extent  Social Connections: Unknown (08/03/2020)   Social Connection and Isolation Panel [NHANES]  Frequency of Communication with Friends and Family: More than three times a week    Frequency of Social Gatherings with Friends and Family: Once a week    Attends Religious Services: Patient refused    Marine scientist or Organizations: Patient refused    Attends Archivist Meetings: Patient refused    Marital Status: Married     Family History: The patient's family history includes ADD / ADHD in her son; Allergies in her son; Asthma in her son and son; Cancer in her paternal grandmother; Colon cancer in an other family member; Diabetes in her sister; Healthy in her brother; Hypertension in her father; Other in her mother.  ROS:   Please see the history of present illness.    All other systems reviewed and are negative.  EKGs/Labs/Other Studies Reviewed:    The following studies were reviewed today:  Recent Labs: 09/14/2021: ALT 57; BUN 9; Creatinine, Ser 0.91; Hemoglobin 13.8; Platelets 356; Potassium 3.5; Sodium 138  Recent Lipid Panel No results found for: "CHOL", "TRIG", "HDL", "CHOLHDL", "VLDL", "LDLCALC", "LDLDIRECT"       Physical Exam:    VS:  BP 122/78   Pulse 75   Ht 5' 6.5" (1.689 m)   Wt 250 lb  (113.4 kg)   SpO2 99%   BMI 39.75 kg/m     Wt Readings from Last 3 Encounters:  10/22/21 250 lb (113.4 kg)  09/23/21 247 lb (112 kg)  09/14/21 246 lb 14.6 oz (112 kg)    Gen: No distress, Obese   Neck: No JVD Cardiac: No Rubs or Gallops, No murmur, RRR +2 radial pulses Respiratory: Clear to auscultation bilaterally, normal effort, normal  respiratory rate GI: Soft, nontender, non-distended  MS: No edema;  moves all extremities Integument: Skin feels warm Neuro:  At time of evaluation, alert and oriented to person/place/time/situation  Psych: Normal affect, patient feels well  ASSESSMENT:    1. Inappropriate sinus tachycardia   2. PCOS (polycystic ovarian syndrome)   3. Morbid obesity (HCC)    PLAN:    Inappropriate sinus tachycardia PCOS Obesity - we will continue her ivabradine 7.5 mg PO BID - she has not tolerated AV nodal agents due to hypotension - we reviewed dysautonomia exercises and discuss a training schedule, exercise, and the pros and cons of aquatic therapy - we will see her in two years unless to symptoms         Medication Adjustments/Labs and Tests Ordered: Current medicines are reviewed at length with the patient today.  Concerns regarding medicines are outlined above.  No orders of the defined types were placed in this encounter.  Meds ordered this encounter  Medications   ivabradine (CORLANOR) 7.5 MG TABS tablet    Sig: Take 1 tablet (7.5 mg total) by mouth 2 (two) times daily with a meal.    Dispense:  180 tablet    Refill:  3    Patient Instructions  Medication Instructions:  Your physician recommends that you continue on your current medications as directed. Please refer to the Current Medication list given to you today.  *If you need a refill on your cardiac medications before your next appointment, please call your pharmacy*   Lab Work: NONE   If you have labs (blood work) drawn today and your tests are completely normal, you will  receive your results only by: [place] (if you have MyChart) OR A paper copy in the mail If you have any lab test that is  abnormal or we need to change your treatment, we will call you to review the results.   Testing/Procedures: NONE    Follow-Up: At [facility], you and your health needs are our priority.  As part of our continuing mission to provide you with exceptional heart care, we have created designated Provider Care Teams.  These Care Teams include your primary Cardiologist (physician) and Advanced Practice Providers (APPs -  Physician Assistants and Nurse Practitioners) who all work together to provide you with the care you need, when you need it.  We recommend signing up for the patient portal called "MyChart".  Sign up information is provided on this After Visit Summary.  MyChart is used to connect with patients for Virtual Visits (Telemedicine).  Patients are able to view lab/test results, encounter notes, upcoming appointments, etc.  Non-urgent messages can be sent to your provider as well.   To learn more about what you can do with MyChart, go to [email or web address].    Your next appointment:   2 year(s)  The format for your next appointment:   In Person  Provider:   Dr. Gasper Sells      Other Instructions Thank you for choosing [facility]!    Important Information About Sugar         Signed, Werner Lean, MD  10/22/2021 1:57 PM    [facility]

## 2021-10-22 ENCOUNTER — Encounter: Payer: Self-pay | Admitting: Internal Medicine

## 2021-10-22 ENCOUNTER — Ambulatory Visit (INDEPENDENT_AMBULATORY_CARE_PROVIDER_SITE_OTHER): Payer: 59 | Admitting: Internal Medicine

## 2021-10-22 VITALS — BP 122/78 | HR 75 | Ht 66.5 in | Wt 250.0 lb

## 2021-10-22 DIAGNOSIS — R000 Tachycardia, unspecified: Secondary | ICD-10-CM | POA: Diagnosis not present

## 2021-10-22 DIAGNOSIS — J4520 Mild intermittent asthma, uncomplicated: Secondary | ICD-10-CM

## 2021-10-22 DIAGNOSIS — E282 Polycystic ovarian syndrome: Secondary | ICD-10-CM

## 2021-10-22 DIAGNOSIS — I4711 Inappropriate sinus tachycardia, so stated: Secondary | ICD-10-CM

## 2021-10-22 MED ORDER — IVABRADINE HCL 7.5 MG PO TABS: 7.5000 mg | ORAL_TABLET | Freq: Two times a day (BID) | ORAL | 3 refills | Status: DC

## 2021-10-22 NOTE — Patient Instructions (Signed)
Medication Instructions:  Your physician recommends that you continue on your current medications as directed. Please refer to the Current Medication list given to you today.  *If you need a refill on your cardiac medications before your next appointment, please call your pharmacy*   Lab Work: NONE   If you have labs (blood work) drawn today and your tests are completely normal, you will receive your results only by: [place] (if you have MyChart) OR A paper copy in the mail If you have any lab test that is abnormal or we need to change your treatment, we will call you to review the results.   Testing/Procedures: NONE    Follow-Up: At [facility], you and your health needs are our priority.  As part of our continuing mission to provide you with exceptional heart care, we have created designated Provider Care Teams.  These Care Teams include your primary Cardiologist (physician) and Advanced Practice Providers (APPs -  Physician Assistants and Nurse Practitioners) who all work together to provide you with the care you need, when you need it.  We recommend signing up for the patient portal called "MyChart".  Sign up information is provided on this After Visit Summary.  MyChart is used to connect with patients for Virtual Visits (Telemedicine).  Patients are able to view lab/test results, encounter notes, upcoming appointments, etc.  Non-urgent messages can be sent to your provider as well.   To learn more about what you can do with MyChart, go to [email or web address].    Your next appointment:   2 year(s)  The format for your next appointment:   In Person  Provider:   Dr. Gasper Sells      Other Instructions Thank you for choosing [facility]!    Important Information About Sugar

## 2021-11-01 ENCOUNTER — Other Ambulatory Visit: Payer: Self-pay | Admitting: Allergy & Immunology

## 2021-11-15 ENCOUNTER — Ambulatory Visit: Payer: 59

## 2021-11-19 ENCOUNTER — Ambulatory Visit: Payer: 59

## 2021-11-22 ENCOUNTER — Ambulatory Visit (INDEPENDENT_AMBULATORY_CARE_PROVIDER_SITE_OTHER): Payer: 59

## 2021-11-22 DIAGNOSIS — L501 Idiopathic urticaria: Secondary | ICD-10-CM

## 2021-12-10 ENCOUNTER — Ambulatory Visit: Payer: 59 | Admitting: Family Medicine

## 2021-12-12 ENCOUNTER — Other Ambulatory Visit: Payer: Self-pay | Admitting: Family Medicine

## 2021-12-20 ENCOUNTER — Ambulatory Visit: Payer: 59

## 2021-12-20 ENCOUNTER — Encounter: Payer: Self-pay | Admitting: Allergy

## 2021-12-20 ENCOUNTER — Ambulatory Visit (INDEPENDENT_AMBULATORY_CARE_PROVIDER_SITE_OTHER): Payer: 59 | Admitting: Allergy

## 2021-12-20 VITALS — BP 112/82 | HR 94 | Temp 99.3°F | Resp 20 | Ht 66.5 in | Wt 253.4 lb

## 2021-12-20 DIAGNOSIS — J3089 Other allergic rhinitis: Secondary | ICD-10-CM | POA: Diagnosis not present

## 2021-12-20 DIAGNOSIS — L501 Idiopathic urticaria: Secondary | ICD-10-CM | POA: Diagnosis not present

## 2021-12-20 DIAGNOSIS — B999 Unspecified infectious disease: Secondary | ICD-10-CM

## 2021-12-20 DIAGNOSIS — J4520 Mild intermittent asthma, uncomplicated: Secondary | ICD-10-CM | POA: Diagnosis not present

## 2021-12-20 DIAGNOSIS — T7800XD Anaphylactic reaction due to unspecified food, subsequent encounter: Secondary | ICD-10-CM

## 2021-12-20 DIAGNOSIS — L508 Other urticaria: Secondary | ICD-10-CM

## 2021-12-20 MED ORDER — CETIRIZINE HCL 10 MG PO TABS: 20.0000 mg | ORAL_TABLET | Freq: Two times a day (BID) | ORAL | 5 refills | Status: DC

## 2021-12-20 MED ORDER — EPINEPHRINE 0.3 MG/0.3ML IJ SOAJ: 0.3000 mg | Freq: Once | INTRAMUSCULAR | 1 refills | Status: DC | PRN

## 2021-12-20 NOTE — Patient Instructions (Addendum)
Asthma Lung function is improved today from previous visit Continue levalbuterol 2 puffs once every 4-6 hours as needed for cough or wheeze You may use levalbuterol 2 puffs 5 to 15 minutes before activity to decrease cough or wheeze For asthma flare, begin Flovent 110-2 puffs twice a day with a spacer for 2 weeks or until cough and wheeze free.  Call the clinic if you need to begin this medication.  Allergic rhinitis Continue allergen avoidance measures directed toward dust mite and cockroach Continue cetirizine Consider saline nasal rinses as needed for nasal symptoms. Use this before any medicated nasal sprays for best result  Hives (urticaria) Controlled with monthly Xolair and Cetirizine 2 tabs twice a day.  May use Benadryl (diphenhydramine) as needed for breakthrough hives If symptoms return, then step up dosage Keep a detailed symptom journal including foods eaten, contact with allergens, medications taken, weather changes.   Food allergy Continue to avoid fish, shellfish, and egg.  In case of an allergic reaction, take Benadryl 50 mg every 4 hours, and if life-threatening symptoms occur, inject with EpiPen 0.3 mg.  Recurrent infections Keep track of infections and antibiotics used If you have any infections or antibiotic needs in future have labs drawn.  Labs have been order from your April visit to screen your immune system.  Whenever you have these done we will get the results of how you system is functioning.  Follow up in 4 months or sooner if needed.

## 2021-12-20 NOTE — Progress Notes (Signed)
Follow-up Note  RE: Dana Mcpherson MRN: [id number] DOB: February 15, 1991 Date of Office Visit: 12/20/2021   History of present illness: Dana Mcpherson is a 31 y.o. female presenting today for follow-up of asthma, allergic rhinitis, urticaria, recurrent infections.   She was last seen in the office on 08/02/2021 by our nurse practitioner Ambs.  She states she was diagnosed with new medical issue that is migraines that "mimic strokes" that's caused by "stress".  She states she had a previous history of migraines however.  She is on migraine prevention medications including the nurtec and topamax.    For her hives she is on Xolair monthly injections.  She received her injection today.  She does states xolair is controlling her hives in general.  She states she has noted if she has missed a monthly injection hive do occur if not on schedule.   She states at this time she can breakout in hives if she eats something she isn't supposed to eat like egg.  She states she avoids fish and shellfish "no matter what".  She has access epipen device.  She does still take zyrtec 2 tabs twice a day.   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  She states she did not start the montelukast due to the side effects/warning labeling with it (she states she is a "really bad sleeper" and "may sleep about 4 hours a day") thus did not want to risk worsening that.  She has xopenex but has not needed to use.  She has not needed to initiate flovent as not had any illnesses.    She did have immunocompetence labs ordered at her visit however it appears these have not been done yet. She has not had any illness/infections since last visit or any antibiotic needs since last visit.    Review of systems in the past 4 weeks: Review of Systems  Constitutional: Negative.   HENT: Negative.    Eyes: Negative.   Respiratory: Negative.    Cardiovascular: Negative.    Gastrointestinal: Negative.   Musculoskeletal: Negative.   Skin: Negative.   Allergic/Immunologic: Negative.   Neurological: Negative.      All other systems negative unless noted above in HPI  Past medical/social/surgical/family history have been reviewed and are unchanged unless specifically indicated below.  No changes  Medication List: Current Outpatient Medications  Medication Sig Dispense Refill   albuterol (VENTOLIN HFA) 108 (90 Base) MCG/ACT inhaler Inhale 1-2 puffs into the lungs every 6 (six) hours as needed. 18 g 2   cetirizine (ZYRTEC) 10 MG tablet TAKE 2 TABLETS BY MOUTH TWICE A DAY. 30 tablet 0   EPINEPHrine (EPIPEN 2-PAK) 0.3 mg/0.3 mL IJ SOAJ injection Inject 0.3 mg into the muscle once as needed for anaphylaxis. 2 each 1   esomeprazole (NEXIUM) 40 MG capsule Take 1 capsule (40 mg total) by mouth 2 (two) times daily before a meal. 60 capsule 11   Flaxseed, Linseed, (FLAXSEED OIL) 1200 MG CAPS Take 1,200 mg by mouth 2 (two) times daily.     ivabradine (CORLANOR) 7.5 MG TABS tablet Take 1 tablet (7.5 mg total) by mouth 2 (two) times daily with a meal. 180 tablet 3   levalbuterol (XOPENEX HFA) 45 MCG/ACT inhaler Inhale 2 puffs into the lungs every 4 (four) hours as needed for wheezing. 1 each 2   levonorgestrel-ethinyl estradiol (SEASONALE) 0.15-0.03 MG tablet TAKE ONE TABLET  BY MOUTH ONCE DAILY. 91 tablet 0   methocarbamol (ROBAXIN) 500 MG tablet Take 1 tablet (500 mg total) by mouth every 8 (eight) hours as needed for muscle spasms. 15 tablet 0   promethazine (PHENERGAN) 25 MG tablet Take 1/2 to 1 tablet every six hours as needed for nausea or vomiting. Use sparingly. 20 tablet 0   Rimegepant Sulfate (NURTEC) 75 MG TBDP Take 75 mg by mouth as needed (for migraine). Max dose 1 pill in 24 hours 8 tablet 6   topiramate (TOPAMAX) 25 MG capsule Take 50 mg (2 pills) in the morning and 75 mg (3 pills) at bedtime 150 capsule 6   XOLAIR 150 MG/ML prefilled syringe INJECT '300MG'$   SUBCUTANEOUSLY EVERY 28 DAYS 2 mL 11   fluticasone (FLOVENT HFA) 110 MCG/ACT inhaler Inhale 2 puffs into the lungs 2 (two) times daily. (Patient not taking: Reported on 10/22/2021) 1 each 5   Current Facility-Administered Medications  Medication Dose Route Frequency Provider Last Rate Last Admin   omalizumab Arvid Right) prefilled syringe 300 mg  300 mg Subcutaneous Q28 days Dara Hoyer, FNP   300 mg at 11/22/21 1450     Known medication allergies: Allergies  Allergen Reactions   Other Anaphylaxis and Other (See Comments)    Pt states that she is allergic to Axe/Tag body spray and Lysol.    General anesthesia - pt woke up several times, was scratching during surgery. Itching   Allergy to sun and bug bites.   Shellfish Allergy Anaphylaxis and Hives   Adhesive [Tape] Hives   Eggs Or Egg-Derived Products Itching and Nausea Only   Latex Itching     Physical examination: Blood pressure 112/82, pulse 94, temperature 99.3 F (37.4 C), resp. rate 20, height 5' 6.5" (1.689 m), weight 253 lb 6 oz (114.9 kg), SpO2 96 %.  General: Alert, interactive, in no acute distress. HEENT: PERRLA, TMs pearly gray, turbinates non-edematous without discharge, post-pharynx non erythematous. Neck: Supple without lymphadenopathy. Lungs: Clear to auscultation without wheezing, rhonchi or rales. {no increased work of breathing. CV: Normal S1, S2 without murmurs. Abdomen: Nondistended, nontender. Skin: Warm and dry, without lesions or rashes. Extremities:  No clubbing, cyanosis or edema. Neuro:   Grossly intact.  Diagnositics/Labs:  Spirometry: FEV1: 2.49L 74%, FVC: 2.88L 71% predicted.  This is slightly improved study from previous.    Assessment and plan:   Asthma Lung function is improved today from previous visit Continue levalbuterol 2 puffs once every 4-6 hours as needed for cough or wheeze You may use levalbuterol 2 puffs 5 to 15 minutes before activity to decrease cough or wheeze For asthma  flare, begin Flovent 110-2 puffs twice a day with a spacer for 2 weeks or until cough and wheeze free.  Call the clinic if you need to begin this medication.  Allergic rhinitis Continue allergen avoidance measures directed toward dust mite and cockroach Continue cetirizine Consider saline nasal rinses as needed for nasal symptoms. Use this before any medicated nasal sprays for best result  Hives (urticaria) Controlled with monthly Xolair and Cetirizine 2 tabs twice a day.  May use Benadryl (diphenhydramine) as needed for breakthrough hives If symptoms return, then step up dosage Keep a detailed symptom journal including foods eaten, contact with allergens, medications taken, weather changes.   Food allergy Continue to avoid fish, shellfish, and egg.  In case of an allergic reaction, take Benadryl 50 mg every 4 hours, and if life-threatening symptoms occur, inject with EpiPen 0.3 mg.  Recurrent infections  Keep track of infections and antibiotics used If you have any infections or antibiotic needs in future have labs drawn.  Labs have been order from your April visit to screen your immune system.  Whenever you have these done we will get the results of how you system is functioning.  Follow up in 4 months or sooner if needed.  I appreciate the opportunity to take part in [place] care. Please do not hesitate to contact me with questions.  Sincerely,   Prudy Feeler, MD Allergy/Immunology [facility]

## 2022-01-03 ENCOUNTER — Encounter: Payer: Self-pay | Admitting: Obstetrics & Gynecology

## 2022-01-03 ENCOUNTER — Telehealth: Payer: Self-pay | Admitting: Obstetrics & Gynecology

## 2022-01-03 MED ORDER — LEVONORGEST-ETH ESTRAD 91-DAY 0.15-0.03 MG PO TABS: 1.0000 | ORAL_TABLET | Freq: Every day | ORAL | 0 refills | Status: DC

## 2022-01-03 NOTE — Addendum Note (Signed)
Addended by: Janece Canterbury on: 01/03/2022 09:54 AM   Modules accepted: Orders

## 2022-01-03 NOTE — Telephone Encounter (Signed)
Refill sent in

## 2022-01-03 NOTE — Telephone Encounter (Signed)
Patient will be running out of her birth control on Wednesday. She called today to make an appointment for her annual. I scheduled her for October 17th with you. She will need a refill to get her through til her visit here. Please advise.

## 2022-01-17 ENCOUNTER — Ambulatory Visit: Payer: 59

## 2022-01-24 ENCOUNTER — Ambulatory Visit: Payer: 59

## 2022-01-31 ENCOUNTER — Ambulatory Visit (INDEPENDENT_AMBULATORY_CARE_PROVIDER_SITE_OTHER): Payer: 59

## 2022-01-31 DIAGNOSIS — L501 Idiopathic urticaria: Secondary | ICD-10-CM

## 2022-01-31 NOTE — Progress Notes (Signed)
Immunotherapy   Patient Details  Name: Dana Mcpherson MRN: [id number] Date of Birth: Jan 11, 1991  01/31/2022  Dana Mcpherson started injections for  Xolair home administration. Following schedule: Every twenty eight days. Frequency:Every four weeks.  Epi-Pen: Yes Consent signed previously and patient instructions given on how to administer into the thigh and abdomen. Patient first demonstrated with the trainer then administered the Xolair herself on both sides of her abdomen. Patient waited in the lobby for a few minutes before having to leave to get her son. Patient left without having any issues.    Julius Bowels 01/31/2022, 2:53 PM

## 2022-02-08 ENCOUNTER — Ambulatory Visit: Payer: 59 | Admitting: Obstetrics & Gynecology

## 2022-02-28 ENCOUNTER — Other Ambulatory Visit (HOSPITAL_COMMUNITY)
Admission: RE | Admit: 2022-02-28 | Discharge: 2022-02-28 | Disposition: A | Discharge status: Home / Self Care | Payer: 59 | Source: Ambulatory Visit | Attending: Obstetrics & Gynecology | Admitting: Obstetrics & Gynecology

## 2022-02-28 ENCOUNTER — Ambulatory Visit (INDEPENDENT_AMBULATORY_CARE_PROVIDER_SITE_OTHER): Payer: 59 | Admitting: Obstetrics & Gynecology

## 2022-02-28 ENCOUNTER — Encounter: Payer: Self-pay | Admitting: Obstetrics & Gynecology

## 2022-02-28 VITALS — BP 128/84 | HR 105 | Ht 66.5 in | Wt 250.0 lb

## 2022-02-28 DIAGNOSIS — Z01419 Encounter for gynecological examination (general) (routine) without abnormal findings: Secondary | ICD-10-CM | POA: Diagnosis present

## 2022-02-28 DIAGNOSIS — Z0000 Encounter for general adult medical examination without abnormal findings: Secondary | ICD-10-CM | POA: Diagnosis present

## 2022-02-28 MED ORDER — LEVONORGEST-ETH ESTRAD 91-DAY 0.15-0.03 MG PO TABS: 1.0000 | ORAL_TABLET | Freq: Every day | ORAL | 3 refills | Status: DC

## 2022-02-28 NOTE — Addendum Note (Signed)
Addended by: Jesusita Oka on: 02/28/2022 04:07 PM   Modules accepted: Orders

## 2022-02-28 NOTE — Progress Notes (Signed)
Subjective:     Dana Mcpherson is a 31 y.o. female here for a routine exam.  No LMP recorded. (Menstrual status: Oral contraceptives). [id number] Birth Control Method:  seasonale Menstrual Calendar(currently): q3 months  Current complaints: none.   Current acute medical issues:  none   Recent Gynecologic History No LMP recorded. (Menstrual status: Oral contraceptives). Last Pap: 06/2019,  normal Last mammogram: ,    Past Medical History:  Diagnosis Date   Allergy    Anxiety    Asthma    Autoimmune disorder ([place])    Complication of anesthesia    woke up during surgery & ithing after surgery   Family history of adverse reaction to anesthesia    "My mom has the same trouble with anesthesia".   GERD (gastroesophageal reflux disease)    Migraine headache    Nexplanon in place 10/05/2015   06/29/15 left arm   Polycystic ovarian syndrome    Pregnancy induced hypertension    Tachycardia    Urticaria    Vaginal Pap smear, abnormal     Past Surgical History:  Procedure Laterality Date   BIOPSY  06/19/2019   Procedure: BIOPSY;  Surgeon: Daneil Dolin, MD;  [facility];  Service: Endoscopy;;   CHOLECYSTECTOMY  2010   COLONOSCOPY WITH PROPOFOL N/A 06/19/2019   Procedure: COLONOSCOPY WITH PROPOFOL;  Surgeon: Daneil Dolin, MD; 1 tubular adenoma, otherwise normal-appearing colonic mucosa, normal TI.  Stool sample for C. difficile negative.  Recommended repeat colonoscopy in 7 years.   ESOPHAGOGASTRODUODENOSCOPY     ESOPHAGOGASTRODUODENOSCOPY (EGD) WITH PROPOFOL N/A 04/08/2019   Procedure: ESOPHAGOGASTRODUODENOSCOPY (EGD) WITH PROPOFOL;  Surgeon: Daneil Dolin, MD; erosive reflux esophagitis s/p dilation, gastric mucosa congested diffusely with snakeskin appearance, antral erosions, congested eroded duodenal bulb, otherwise normal.   LAPAROSCOPIC BILATERAL SALPINGECTOMY Bilateral 10/28/2020   Procedure: LAPAROSCOPIC BILATERAL SALPINGECTOMY;  Surgeon: Florian Buff, MD;   Location: [facility];  Service: Gynecology;  Laterality: Bilateral;   MALONEY DILATION N/A 04/08/2019   Procedure: Venia Minks DILATION;  Surgeon: Daneil Dolin, MD;  [facility];  Service: Endoscopy;  Laterality: N/A;   POLYPECTOMY  06/19/2019   Procedure: POLYPECTOMY;  Surgeon: Daneil Dolin, MD;  [facility];  Service: Endoscopy;;  cecal polyp    OB History     Gravida  3   Para  2   Term  2   Preterm      AB  1   Living  2      SAB  1   IAB      Ectopic      Multiple  0   Live Births  2        Obstetric Comments  IOL for pre-e         Social History   Socioeconomic History   Marital status: Married    Spouse name: Not on file   Number of children: Not on file   Years of education: Not on file   Highest education level: Not on file  Occupational History   Not on file  Tobacco Use   Smoking status: Former    Packs/day: 0.25    Years: 2.00    Total pack years: 0.50    Types: Cigarettes    Quit date: 04/04/2010    Years since quitting: 11.9   Smokeless tobacco: Never  Vaping Use   Vaping Use: Never used  Substance and Sexual Activity   Alcohol use: No  Drug use: No   Sexual activity: Yes    Birth control/protection: Pill, Surgical  Other Topics Concern   Not on file  Social History Narrative   Right handed    Caffeine- none   Social Determinants of Health   Financial Resource Strain: Low Risk  (02/28/2022)   Overall Financial Resource Strain (CARDIA)    Difficulty of Paying Living Expenses: Not hard at all  Food Insecurity: No Food Insecurity (02/28/2022)   Hunger Vital Sign    Worried About Running Out of Food in the Last Year: Never true    Ran Out of Food in the Last Year: Never true  Transportation Needs: No Transportation Needs (02/28/2022)   PRAPARE - Hydrologist (Medical): No    Lack of Transportation (Non-Medical): No  Physical Activity: Insufficiently Active (02/28/2022)    Exercise Vital Sign    Days of Exercise per Week: 5 days    Minutes of Exercise per Session: 20 min  Stress: Stress Concern Present (02/28/2022)   [place]    Feeling of Stress : Rather much  Social Connections: Unknown (02/28/2022)   Social Connection and Isolation Panel [NHANES]    Frequency of Communication with Friends and Family: More than three times a week    Frequency of Social Gatherings with Friends and Family: Patient refused    Attends Religious Services: Patient refused    Marine scientist or Organizations: Patient refused    Attends Music therapist: Patient refused    Marital Status: Married    Family History  Problem Relation Age of Onset   Cancer Paternal Grandmother        ovarian cancer   Other Mother        Ovarian Tumor   Hypertension Father    Asthma Son    ADD / ADHD Son    Asthma Son    Allergies Son    Colon cancer Other        late 28's   Diabetes Sister    Healthy Brother      Current Outpatient Medications:    albuterol (VENTOLIN HFA) 108 (90 Base) MCG/ACT inhaler, Inhale 1-2 puffs into the lungs every 6 (six) hours as needed., Disp: 18 g, Rfl: 2   cetirizine (ZYRTEC) 10 MG tablet, Take 2 tablets (20 mg total) by mouth 2 (two) times daily., Disp: 120 tablet, Rfl: 5   EPINEPHrine (EPIPEN 2-PAK) 0.3 mg/0.3 mL IJ SOAJ injection, Inject 0.3 mg into the muscle once as needed for anaphylaxis., Disp: 2 each, Rfl: 1   esomeprazole (NEXIUM) 40 MG capsule, Take 1 capsule (40 mg total) by mouth 2 (two) times daily before a meal., Disp: 60 capsule, Rfl: 11   Flaxseed, Linseed, (FLAXSEED OIL) 1200 MG CAPS, Take 1,200 mg by mouth 2 (two) times daily., Disp: , Rfl:    ivabradine (CORLANOR) 7.5 MG TABS tablet, Take 1 tablet (7.5 mg total) by mouth 2 (two) times daily with a meal., Disp: 180 tablet, Rfl: 3   levalbuterol (XOPENEX HFA) 45 MCG/ACT inhaler, Inhale 2 puffs into the  lungs every 4 (four) hours as needed for wheezing., Disp: 1 each, Rfl: 2   promethazine (PHENERGAN) 25 MG tablet, Take 1/2 to 1 tablet every six hours as needed for nausea or vomiting. Use sparingly., Disp: 20 tablet, Rfl: 0   Rimegepant Sulfate (NURTEC) 75 MG TBDP, Take 75 mg by mouth as needed (for migraine). Max  dose 1 pill in 24 hours, Disp: 8 tablet, Rfl: 6   topiramate (TOPAMAX) 25 MG capsule, Take 50 mg (2 pills) in the morning and 75 mg (3 pills) at bedtime, Disp: 150 capsule, Rfl: 6   XOLAIR 150 MG/ML prefilled syringe, INJECT '300MG'$  SUBCUTANEOUSLY EVERY 28 DAYS, Disp: 2 mL, Rfl: 11   fluticasone (FLOVENT HFA) 110 MCG/ACT inhaler, Inhale 2 puffs into the lungs 2 (two) times daily. (Patient not taking: Reported on 10/22/2021), Disp: 1 each, Rfl: 5   levonorgestrel-ethinyl estradiol (SEASONALE) 0.15-0.03 MG tablet, Take 1 tablet by mouth daily., Disp: 91 tablet, Rfl: 3   methocarbamol (ROBAXIN) 500 MG tablet, Take 1 tablet (500 mg total) by mouth every 8 (eight) hours as needed for muscle spasms. (Patient not taking: Reported on 02/28/2022), Disp: 15 tablet, Rfl: 0  Current Facility-Administered Medications:    omalizumab Arvid Right) prefilled syringe 300 mg, 300 mg, Subcutaneous, Q28 days, Ambs, Anne M, FNP, 300 mg at 01/31/22 1434  Review of Systems  Review of Systems  Constitutional: Negative for fever, chills, weight loss, malaise/fatigue and diaphoresis.  HENT: Negative for hearing loss, ear pain, nosebleeds, congestion, sore throat, neck pain, tinnitus and ear discharge.   Eyes: Negative for blurred vision, double vision, photophobia, pain, discharge and redness.  Respiratory: Negative for cough, hemoptysis, sputum production, shortness of breath, wheezing and stridor.   Cardiovascular: Negative for chest pain, palpitations, orthopnea, claudication, leg swelling and PND.  Gastrointestinal: negative for abdominal pain. Negative for heartburn, nausea, vomiting, diarrhea, constipation, blood  in stool and melena.  Genitourinary: Negative for dysuria, urgency, frequency, hematuria and flank pain.  Musculoskeletal: Negative for myalgias, back pain, joint pain and falls.  Skin: Negative for itching and rash.  Neurological: Negative for dizziness, tingling, tremors, sensory change, speech change, focal weakness, seizures, loss of consciousness, weakness and headaches.  Endo/Heme/Allergies: Negative for environmental allergies and polydipsia. Does not bruise/bleed easily.  Psychiatric/Behavioral: Negative for depression, suicidal ideas, hallucinations, memory loss and substance abuse. The patient is not nervous/anxious and does not have insomnia.        Objective:  Blood pressure 128/84, pulse (!) 105, height 5' 6.5" (1.689 m), weight 250 lb (113.4 kg).   Physical Exam  Vitals reviewed. Constitutional: She is oriented to person, place, and time. She appears well-developed and well-nourished.  HENT:  Head: Normocephalic and atraumatic.        Right Ear: External ear normal.  Left Ear: External ear normal.  Nose: Nose normal.  Mouth/Throat: Oropharynx is clear and moist.  Eyes: Conjunctivae and EOM are normal. Pupils are equal, round, and reactive to light. Right eye exhibits no discharge. Left eye exhibits no discharge. No scleral icterus.  Neck: Normal range of motion. Neck supple. No tracheal deviation present. No thyromegaly present.  Cardiovascular: Normal rate, regular rhythm, normal heart sounds and intact distal pulses.  Exam reveals no gallop and no friction rub.   No murmur heard. Respiratory: Effort normal and breath sounds normal. No respiratory distress. She has no wheezes. She has no rales. She exhibits no tenderness.  GI: Soft. Bowel sounds are normal. She exhibits no distension and no mass. There is no tenderness. There is no rebound and no guarding.  Genitourinary:  Breasts no masses skin changes or nipple changes bilaterally      Vulva is normal without  lesions Vagina is pink moist without discharge Cervix normal in appearance and pap is done Uterus is normal size shape and contour Adnexa is negative with normal sized ovaries  Musculoskeletal: Normal range of motion. She exhibits no edema and no tenderness.  Neurological: She is alert and oriented to person, place, and time. She has normal reflexes. She displays normal reflexes. No cranial nerve deficit. She exhibits normal muscle tone. Coordination normal.  Skin: Skin is warm and dry. No rash noted. No erythema. No pallor.  Psychiatric: She has a normal mood and affect. Her behavior is normal. Judgment and thought content normal.       Medications Ordered at today's visit: Meds ordered this encounter  Medications   levonorgestrel-ethinyl estradiol (SEASONALE) 0.15-0.03 MG tablet    Sig: Take 1 tablet by mouth daily.    Dispense:  91 tablet    Refill:  3    Needs appt for physical prior to any further refills    Other orders placed at today's visit: No orders of the defined types were placed in this encounter.     Assessment:    Normal Gyn exam.    Plan:    Contraception: OCP (estrogen/progesterone). Follow up in: 3 years.     Return in about 3 years (around 02/28/2025) for yearly.

## 2022-03-02 NOTE — Progress Notes (Unsigned)
   CC:  headaches  Follow-up Visit  Last visit: 09/23/21  Brief HPI: 31 year old female with a history of asthma, PCOS, inappropriate sinus tachycardia who follows in clinic for hemiplegic migraine. MRI/MRA 09/14/21 was unremarkable.  At her last visit, Topamax was increased to 50/75. She was started on Nurtec for migraine rescue.  Interval History: Headaches have improved since her last visit. She continues to get paresthesias and brain fog 1-2 times per month. The increased dose of Topamax made her feel generally unwell so she went back down to 50 mg BID. Hasn't tried Nurtec yet as she hasn't had any severe headaches since her last visit. Has been taking ibuprofen as needed for her more mild headaches.  Current Headache Regimen: Preventative: Topamax 50/75 Abortive: Nurtec 75 mg PRN  Prior Therapies                                  Topamax 50 mg BID Metoprolol 12.5 mg TID - hypotension Nurtec 75 mg PRN  Physical Exam:   Vital Signs: BP (!) 157/78   Pulse 83   Ht 5' 6.5" (1.689 m)   Wt 250 lb (113.4 kg)   BMI 39.75 kg/m  GENERAL:  well appearing, in no acute distress, alert  SKIN:  Color, texture, turgor normal. No rashes or lesions HEAD:  Normocephalic/atraumatic. RESP: normal respiratory effort MSK:  No gross joint deformities.   NEUROLOGICAL: Mental Status: Alert, oriented to person, place and time, Follows commands, and Speech fluent and appropriate. Cranial Nerves: PERRL, face symmetric, no dysarthria, hearing grossly intact Motor: moves all extremities equally Gait: normal-based.  IMPRESSION: 31 year old female with a history of asthma, PCOS, and inappropriate sinus tachycardia who presents for follow up of hemiplegic migraines. She was unable to tolerate higher doses of Topamax. Would prefer to avoid TCAs as they can exacerbate arrhythmias including sinus tachycardia. Will add Cymbalta for migraine prevention. If she does well on Cymbalta may try to wean off of  Topamax in the future. Encouraged her to try Nurtec when she experiences her typical migraine aura.  PLAN: -Prevention: Start Cymbalta 20 mg daily. Continue Topamax 50 mg BID for now -Rescue: Continue Nurtec 75 mg PRN -Next steps: consider verapamil, CGRP for prevention   Follow-up: 3 months  I spent a total of 26 minutes on the date of the service. Headache education was done. Discussed treatment options including preventive and acute medications. Discussed medication side effects, adverse reactions and drug interactions. Written educational materials and patient instructions outlining all of the above were given.  Genia Harold, MD 03/03/22 3:23 PM

## 2022-03-03 ENCOUNTER — Encounter: Payer: Self-pay | Admitting: Psychiatry

## 2022-03-03 ENCOUNTER — Ambulatory Visit (INDEPENDENT_AMBULATORY_CARE_PROVIDER_SITE_OTHER): Payer: 59 | Admitting: Psychiatry

## 2022-03-03 VITALS — BP 157/78 | HR 83 | Ht 66.5 in | Wt 250.0 lb

## 2022-03-03 DIAGNOSIS — G43401 Hemiplegic migraine, not intractable, with status migrainosus: Secondary | ICD-10-CM

## 2022-03-03 LAB — CYTOLOGY - PAP
Comment: NEGATIVE
Comment: NEGATIVE
Comment: NEGATIVE
Diagnosis: NEGATIVE
HPV 16: NEGATIVE
HPV 18 / 45: NEGATIVE
High risk HPV: POSITIVE — AB

## 2022-03-03 MED ORDER — ONDANSETRON HCL 8 MG PO TABS: 8.0000 mg | ORAL_TABLET | Freq: Three times a day (TID) | ORAL | 5 refills | Status: DC | PRN

## 2022-03-03 MED ORDER — DULOXETINE HCL 20 MG PO CPEP: 20.0000 mg | ORAL_CAPSULE | Freq: Every day | ORAL | 6 refills | Status: DC

## 2022-03-21 ENCOUNTER — Telehealth: Payer: Self-pay

## 2022-03-21 NOTE — Telephone Encounter (Signed)
Called and left a voicemail asking for patient to return call to discuss.  °

## 2022-03-21 NOTE — Telephone Encounter (Signed)
Patient called stating she started having pain in her abdomen later on in the day after doing the injection. Patient states her abdomen was very sore for a few days. Patient is wondering should she try it somewhere else or is this normal? Patient is past due for her next one at this time.

## 2022-03-22 NOTE — Telephone Encounter (Signed)
Patient called and stated that she gave her injection to herself in the stomach with her Xolair and she stated a couple hours later she started experiencing stomach pain and having tenderness and pain at the injection site and had stomach pain for the next couple days. Patient is wondering if she should try again in the stomach or try a different area. She has been avoiding the injection and is a little past due.

## 2022-03-25 NOTE — Telephone Encounter (Signed)
Called and left a voicemail asking for patient to return call to discuss.  °

## 2022-03-29 ENCOUNTER — Encounter: Payer: Self-pay | Admitting: *Deleted

## 2022-03-29 NOTE — Telephone Encounter (Signed)
Patient called back so I informed patient of Dr. Nelva Bush and Ashleigh's message.  Patient stated she will try her thighs and if she has any issues she will give [place] a call back.

## 2022-03-29 NOTE — Telephone Encounter (Signed)
Attempted to call patient again and was unable to get through. MyChart message has been sent to patient.

## 2022-04-22 ENCOUNTER — Ambulatory Visit (INDEPENDENT_AMBULATORY_CARE_PROVIDER_SITE_OTHER): Payer: 59 | Admitting: Allergy & Immunology

## 2022-04-22 ENCOUNTER — Other Ambulatory Visit: Payer: Self-pay

## 2022-04-22 ENCOUNTER — Encounter: Payer: Self-pay | Admitting: Allergy & Immunology

## 2022-04-22 VITALS — BP 110/80 | HR 90 | Temp 97.7°F | Resp 18

## 2022-04-22 DIAGNOSIS — J4520 Mild intermittent asthma, uncomplicated: Secondary | ICD-10-CM | POA: Diagnosis not present

## 2022-04-22 DIAGNOSIS — T7800XD Anaphylactic reaction due to unspecified food, subsequent encounter: Secondary | ICD-10-CM

## 2022-04-22 DIAGNOSIS — B999 Unspecified infectious disease: Secondary | ICD-10-CM

## 2022-04-22 DIAGNOSIS — J3089 Other allergic rhinitis: Secondary | ICD-10-CM

## 2022-04-22 DIAGNOSIS — L501 Idiopathic urticaria: Secondary | ICD-10-CM

## 2022-04-22 MED ORDER — ALBUTEROL SULFATE HFA 108 (90 BASE) MCG/ACT IN AERS: 1.0000 | INHALATION_SPRAY | Freq: Four times a day (QID) | RESPIRATORY_TRACT | 1 refills | Status: AC | PRN

## 2022-04-22 MED ORDER — CETIRIZINE HCL 10 MG PO TABS: 20.0000 mg | ORAL_TABLET | Freq: Two times a day (BID) | ORAL | 5 refills | Status: DC

## 2022-04-22 NOTE — Progress Notes (Incomplete)
FOLLOW UP  Date of Service/Encounter:  04/22/22   Assessment:   Mild intermittent asthma, uncomplicated   Chronic rhinitis   Anaphylactic shock due to food   Chronic urticaria - interested in initiating Xolair   Joint pains in hands/wrist - follows with Dr. Vernelle Emerald   Tachycardia - exacerbated with albuterol use   Full vaccinated with Moderna  Plan/Recommendations:    Patient Instructions  Asthma - Lung function looks worse today but it is still normal. - I think that Xolair is helping with your asthma. - Continue with Xopenex (levalbuterol) or albuterol as needed.   2. Allergic rhinitis - Continue allergen avoidance measures directed toward dust mite and cockroach - Continue cetirizine '10mg'$  daily.  - Consider saline nasal rinses as needed for nasal symptoms. Use this before any medicated nasal sprays for best result  3. Hives (urticaria) - Controlled with monthly Xolair and Cetirizine 2 tabs twice a day.  - This is on auto shipment, so you should get it per Tammy. - It is in styrofoam and has ice packs, so it will be fine for several hours if it arrives when you are at work.  - Call us with any issues.  - May use Benadryl (diphenhydramine) as needed for breakthrough hives  4. Food allergy (seafood, egg) - Continue to avoid fish, shellfish, and egg.   - WHY DID YOU EAT EGG?!?! :-p  - In case of an allergic reaction, take Benadryl 50 mg every 4 hours, and if life-threatening symptoms occur, inject with EpiPen 0.3 mg.  5. Recurrent infections - Keep track of infections and antibiotics used - If you have any infections or antibiotic needs in future have labs drawn.  - You had immune labs ordered in April 2023 that you can still get drawn.   6. Return in about 6 months (around 10/22/2022).    Please inform us of any Emergency Department visits, hospitalizations, or changes in symptoms. Call us before going to the ED for breathing or allergy symptoms since  we might be able to fit you in for a sick visit. Feel free to contact us anytime with any questions, problems, or concerns.  It was a pleasure to see you again today!  Websites that have reliable patient information: 1. American Academy of Asthma, Allergy, and Immunology: www.aaaai.org 2. Food Allergy Research and Education (FARE): foodallergy.org 3. Mothers of Asthmatics: http://www.asthmacommunitynetwork.org 4. American College of Allergy, Asthma, and Immunology: www.acaai.org   COVID-19 Vaccine Information can be found at: ShippingScam.co.uk For questions related to vaccine distribution or appointments, please email [email or web address] or call [phone].   We realize that you might be concerned about having an allergic reaction to the COVID19 vaccines. To help with that concern, WE ARE OFFERING THE COVID19 VACCINES IN OUR OFFICE! Ask the front desk for dates!     "Like" [place] on Facebook and Instagram for our latest updates!      A healthy democracy works best when [employer] participate! Make sure you are registered to vote! If you have moved or changed any of your contact information, you will need to get this updated before voting!  In some cases, you MAY be able to register to vote online: [email or web address]            Subjective:   [place] N Sensabaugh is a 31 y.o. female presenting today for follow up of  Chief Complaint  Patient presents with  . Follow-up  . Urticaria    [place] N Protzman has a  history of the following: Patient Active Problem List   Diagnosis Date Noted  . Idiopathic urticaria 08/02/2021  . Recurrent infections 08/02/2021  . Rash and other nonspecific skin eruption 03/01/2021  . Encounter for sterilization   . Positive ANA (antinuclear antibody) 08/31/2020  . Weakness of both hands 08/31/2020  . Upper back pain 08/31/2020  . Mild intermittent asthma  without complication [id number]/82/7078  . Perennial allergic rhinitis 06/12/2020  . Anaphylactic shock due to adverse food reaction 06/12/2020  . Chronic urticaria 06/12/2020  . Early satiety 03/11/2020  . Abdominal pain 05/24/2019  . Abdominal bloating 02/20/2019  . Elevated LFTs 02/20/2019  . Gastroesophageal reflux disease 02/20/2019  . Dysphagia 02/20/2019  . Diarrhea 02/20/2019  . Hemorrhoid 06/29/2015  . Hidradenitis suppurativa 06/23/2015  . Shortness of breath 11/04/2014  . Inappropriate sinus tachycardia 10/07/2014  . ASCUS with positive high risk HPV 05/28/2014  . PCOS (polycystic ovarian syndrome) 03/03/2014  . Hyperinsulinemia 03/03/2014  . Morbid obesity ([place]) 05/23/2013  . DUB (dysfunctional uterine bleeding) 05/23/2013    History obtained from: chart review and patient.  [place] is a 31 y.o. female presenting for a follow up visit.  She was last seen in August 2023 by Dr. Nelva Bush.  At that time, her lung function was improved.  She was continued on Xopenex as needed and Flovent added during flares.  For her allergic rhinitis, she was continued on Zyrtec as well as nasal sprays as needed.  Her hives were controlled with Xolair and cetirizine 2 tablets twice daily.  She continues to avoid shellfish, shellfish, and egg.  For her recurrent infections, she had an immune workup ordered in April 2023 that never been collected.  Since last visit,   Asthma/Respiratory Symptom History: Breathing has been under good control. It is typically well managed. She is now working on the second floor and she is going up and down stairs all day long. This does cause some SOB. She cannot use an inhaler because ofh er cardiac condition. She gets out of breath easily.  We have tried ICS that seem to make her heart rate jump up. It made her heart race. She is now doing breathing techniques. She does report that the Xolair helps with her asthma, but she still has symptoms.   {Blank  single:19197::"Allergic Rhinitis Symptom History: ***"," "}  {Blank single:19197::"Food Allergy Symptom History: ***"," "}  Skin Symptom History: She has been back on schedule for her Xolair.   {Blank single:19197::"GERD Symptom History: ***"," "}  Otherwise, there have been no changes to her past medical history, surgical history, family history, or social history.    ROS     Objective:   Blood pressure 110/80, pulse 90, temperature 97.7 F (36.5 C), temperature source Temporal, resp. rate 18, SpO2 97 %. There is no height or weight on file to calculate BMI.    Physical Exam   Diagnostic studies:    Spirometry: results normal (FEV1: 2.37/70%, FVC: 2.69/66%, FEV1/FVC: 88%).    Spirometry consistent with possible restrictive disease. {Blank single:19197::"Albuterol/Atrovent nebulizer","Xopenex/Atrovent nebulizer","Albuterol nebulizer","Albuterol four puffs via MDI","Xopenex four puffs via MDI"} treatment given in clinic with {Blank single:19197::"significant improvement in FEV1 per ATS criteria","significant improvement in FVC per ATS criteria","significant improvement in FEV1 and FVC per ATS criteria","improvement in FEV1, but not significant per ATS criteria","improvement in FVC, but not significant per ATS criteria","improvement in FEV1 and FVC, but not significant per ATS criteria","no improvement"}.  Allergy Studies: {Blank single:19197::"none","labs sent instead"," "}    {Blank single:19197::"Allergy testing results  were read and interpreted by myself, documented by clinical staff."," "}      Salvatore Marvel, MD  [facility] [place] [facility]

## 2022-04-22 NOTE — Patient Instructions (Addendum)
Asthma - Lung function looks worse today but it is still normal. - I think that Xolair is helping with your asthma. - Continue with Xopenex (levalbuterol) or albuterol as needed.   2. Allergic rhinitis - Continue allergen avoidance measures directed toward dust mite and cockroach - Continue cetirizine '10mg'$  daily.  - Consider saline nasal rinses as needed for nasal symptoms. Use this before any medicated nasal sprays for best result  3. Hives (urticaria) - Controlled with monthly Xolair and Cetirizine 2 tabs twice a day.  - This is on auto shipment, so you should get it per Tammy. - It is in styrofoam and has ice packs, so it will be fine for several hours if it arrives when you are at work.  - Call us with any issues.  - May use Benadryl (diphenhydramine) as needed for breakthrough hives  4. Food allergy (seafood, egg) - Continue to avoid fish, shellfish, and egg.   - WHY DID YOU EAT EGG?!?! :-p  - In case of an allergic reaction, take Benadryl 50 mg every 4 hours, and if life-threatening symptoms occur, inject with EpiPen 0.3 mg.  5. Recurrent infections - Keep track of infections and antibiotics used - If you have any infections or antibiotic needs in future have labs drawn.  - You had immune labs ordered in April 2023 that you can still get drawn.   6. Return in about 6 months (around 10/22/2022).    Please inform us of any Emergency Department visits, hospitalizations, or changes in symptoms. Call us before going to the ED for breathing or allergy symptoms since we might be able to fit you in for a sick visit. Feel free to contact us anytime with any questions, problems, or concerns.  It was a pleasure to see you again today!  Websites that have reliable patient information: 1. American Academy of Asthma, Allergy, and Immunology: www.aaaai.org 2. Food Allergy Research and Education (FARE): foodallergy.org 3. Mothers of Asthmatics: http://www.asthmacommunitynetwork.org 4.  American College of Allergy, Asthma, and Immunology: www.acaai.org   COVID-19 Vaccine Information can be found at: ShippingScam.co.uk For questions related to vaccine distribution or appointments, please email [email or web address] or call [phone].   We realize that you might be concerned about having an allergic reaction to the COVID19 vaccines. To help with that concern, WE ARE OFFERING THE COVID19 VACCINES IN OUR OFFICE! Ask the front desk for dates!     "Like" [place] on Facebook and Instagram for our latest updates!      A healthy democracy works best when [employer] participate! Make sure you are registered to vote! If you have moved or changed any of your contact information, you will need to get this updated before voting!  In some cases, you MAY be able to register to vote online: [email or web address]

## 2022-04-25 ENCOUNTER — Encounter: Payer: Self-pay | Admitting: Allergy & Immunology

## 2022-04-28 ENCOUNTER — Ambulatory Visit (INDEPENDENT_AMBULATORY_CARE_PROVIDER_SITE_OTHER): Payer: 59

## 2022-04-28 ENCOUNTER — Ambulatory Visit
Admission: EM | Admit: 2022-04-28 | Discharge: 2022-04-28 | Disposition: A | Discharge status: Home / Self Care | Payer: 59 | Attending: Family Medicine | Admitting: Family Medicine

## 2022-04-28 DIAGNOSIS — R0789 Other chest pain: Secondary | ICD-10-CM | POA: Diagnosis not present

## 2022-04-28 DIAGNOSIS — R062 Wheezing: Secondary | ICD-10-CM | POA: Diagnosis not present

## 2022-04-28 DIAGNOSIS — J4521 Mild intermittent asthma with (acute) exacerbation: Secondary | ICD-10-CM | POA: Diagnosis not present

## 2022-04-28 DIAGNOSIS — R059 Cough, unspecified: Secondary | ICD-10-CM | POA: Diagnosis not present

## 2022-04-28 DIAGNOSIS — J111 Influenza due to unidentified influenza virus with other respiratory manifestations: Secondary | ICD-10-CM | POA: Diagnosis not present

## 2022-04-28 MED ORDER — PROMETHAZINE-DM 6.25-15 MG/5ML PO SYRP: 5.0000 mL | ORAL_SOLUTION | Freq: Four times a day (QID) | ORAL | 0 refills | Status: DC | PRN

## 2022-04-28 MED ORDER — ALBUTEROL SULFATE (2.5 MG/3ML) 0.083% IN NEBU
2.5000 mg | INHALATION_SOLUTION | Freq: Once | RESPIRATORY_TRACT | Status: AC
  Administered 2022-04-28: 2.5 mg via RESPIRATORY_TRACT

## 2022-04-28 MED ORDER — PREDNISONE 20 MG PO TABS: 40.0000 mg | ORAL_TABLET | Freq: Every day | ORAL | 0 refills | Status: DC

## 2022-04-28 NOTE — ED Provider Notes (Signed)
[facility]    CSN: [id number] Arrival date & time: 04/28/22  1214      History   Chief Complaint No chief complaint on file.   HPI Dana Mcpherson is a 32 y.o. female.   Presenting today with 1 day of progressively worsening cough, chest tightness, shortness of breath, wheezing on the tail end of finishing Tamiflu for influenza.  States her symptoms initially started 6 days ago, thought she was getting better until yesterday.  Denies current fever, body aches, chest pain, abdominal pain, nausea vomiting or diarrhea.  Has been taking DayQuil and NyQuil with minimal relief and using her albuterol inhaler for asthma here and there with very small amount of benefit.    Past Medical History:  Diagnosis Date   Allergy    Anxiety    Asthma    Autoimmune disorder ([place])    Complication of anesthesia    woke up during surgery & ithing after surgery   Family history of adverse reaction to anesthesia    "My mom has the same trouble with anesthesia".   GERD (gastroesophageal reflux disease)    Migraine headache    Nexplanon in place 10/05/2015   06/29/15 left arm   Polycystic ovarian syndrome    Pregnancy induced hypertension    Tachycardia    Urticaria    Vaginal Pap smear, abnormal     Patient Active Problem List   Diagnosis Date Noted   Idiopathic urticaria 08/02/2021   Recurrent infections 08/02/2021   Rash and other nonspecific skin eruption 03/01/2021   Encounter for sterilization    Positive ANA (antinuclear antibody) 08/31/2020   Weakness of both hands 08/31/2020   Upper back pain 08/31/2020   Mild intermittent asthma without complication 89/38/1017   Perennial allergic rhinitis 06/12/2020   Anaphylactic shock due to adverse food reaction 06/12/2020   Chronic urticaria 06/12/2020   Early satiety 03/11/2020   Abdominal pain 05/24/2019   Abdominal bloating 02/20/2019   Elevated LFTs 02/20/2019   Gastroesophageal reflux disease 02/20/2019   Dysphagia  02/20/2019   Diarrhea 02/20/2019   Hemorrhoid 06/29/2015   Hidradenitis suppurativa 06/23/2015   Shortness of breath 11/04/2014   Inappropriate sinus tachycardia 10/07/2014   ASCUS with positive high risk HPV 05/28/2014   PCOS (polycystic ovarian syndrome) 03/03/2014   Hyperinsulinemia 03/03/2014   Morbid obesity ([place]) 05/23/2013   DUB (dysfunctional uterine bleeding) 05/23/2013    Past Surgical History:  Procedure Laterality Date   BIOPSY  06/19/2019   Procedure: BIOPSY;  Surgeon: Daneil Dolin, MD;  [facility];  Service: Endoscopy;;   CHOLECYSTECTOMY  2010   COLONOSCOPY WITH PROPOFOL N/A 06/19/2019   Procedure: COLONOSCOPY WITH PROPOFOL;  Surgeon: Daneil Dolin, MD; 1 tubular adenoma, otherwise normal-appearing colonic mucosa, normal TI.  Stool sample for C. difficile negative.  Recommended repeat colonoscopy in 7 years.   ESOPHAGOGASTRODUODENOSCOPY     ESOPHAGOGASTRODUODENOSCOPY (EGD) WITH PROPOFOL N/A 04/08/2019   Procedure: ESOPHAGOGASTRODUODENOSCOPY (EGD) WITH PROPOFOL;  Surgeon: Daneil Dolin, MD; erosive reflux esophagitis s/p dilation, gastric mucosa congested diffusely with snakeskin appearance, antral erosions, congested eroded duodenal bulb, otherwise normal.   LAPAROSCOPIC BILATERAL SALPINGECTOMY Bilateral 10/28/2020   Procedure: LAPAROSCOPIC BILATERAL SALPINGECTOMY;  Surgeon: Florian Buff, MD;  Location: [facility];  Service: Gynecology;  Laterality: Bilateral;   MALONEY DILATION N/A 04/08/2019   Procedure: Venia Minks DILATION;  Surgeon: Daneil Dolin, MD;  Location: [facility];  Service: Endoscopy;  Laterality: N/A;   POLYPECTOMY  06/19/2019  Procedure: POLYPECTOMY;  Surgeon: Daneil Dolin, MD;  [facility];  Service: Endoscopy;;  cecal polyp    OB History     Gravida  3   Para  2   Term  2   Preterm      AB  1   Living  2      SAB  1   IAB      Ectopic      Multiple  0   Live Births  2        Obstetric Comments   IOL for pre-e          Home Medications    Prior to Admission medications   Medication Sig Start Date End Date Taking? Authorizing Provider  predniSONE (DELTASONE) 20 MG tablet Take 2 tablets (40 mg total) by mouth daily with breakfast. 04/28/22  Yes Volney American, PA-C  promethazine-dextromethorphan (PROMETHAZINE-DM) 6.25-15 MG/5ML syrup Take 5 mLs by mouth 4 (four) times daily as needed. 04/28/22  Yes Volney American, PA-C  albuterol (VENTOLIN HFA) 108 (90 Base) MCG/ACT inhaler Inhale 1-2 puffs into the lungs every 6 (six) hours as needed. 04/22/22   Valentina Shaggy, MD  cetirizine (ZYRTEC) 10 MG tablet Take 2 tablets (20 mg total) by mouth 2 (two) times daily. 04/22/22   Valentina Shaggy, MD  DULoxetine (CYMBALTA) 20 MG capsule Take 1 capsule (20 mg total) by mouth daily. Patient not taking: Reported on 04/22/2022 03/03/22   Genia Harold, MD  EPINEPHrine (EPIPEN 2-PAK) 0.3 mg/0.3 mL IJ SOAJ injection Inject 0.3 mg into the muscle once as needed for anaphylaxis. 12/20/21   Kennith Gain, MD  esomeprazole (NEXIUM) 40 MG capsule Take 1 capsule (40 mg total) by mouth 2 (two) times daily before a meal. 06/30/21   Mahala Menghini, PA-C  Flaxseed, Linseed, (FLAXSEED OIL) 1200 MG CAPS Take 1,200 mg by mouth 2 (two) times daily.    [provider]  ivabradine (CORLANOR) 7.5 MG TABS tablet Take 1 tablet (7.5 mg total) by mouth 2 (two) times daily with a meal. 10/22/21   Chandrasekhar, Mahesh A, MD  levonorgestrel-ethinyl estradiol (SEASONALE) 0.15-0.03 MG tablet Take 1 tablet by mouth daily. 02/28/22   Florian Buff, MD  ondansetron (ZOFRAN) 8 MG tablet Take 1 tablet (8 mg total) by mouth every 8 (eight) hours as needed for nausea or vomiting. 03/03/22   Genia Harold, MD  promethazine (PHENERGAN) 25 MG tablet Take 1/2 to 1 tablet every six hours as needed for nausea or vomiting. Use sparingly. 06/30/21   Mahala Menghini, PA-C  Rimegepant Sulfate (NURTEC)  75 MG TBDP Take 75 mg by mouth as needed (for migraine). Max dose 1 pill in 24 hours 09/23/21   Genia Harold, MD  topiramate (TOPAMAX) 25 MG capsule Take 50 mg (2 pills) in the morning and 75 mg (3 pills) at bedtime 09/23/21   Genia Harold, MD  Arvid Right 150 MG/ML prefilled syringe INJECT '300MG'$  SUBCUTANEOUSLY EVERY 28 DAYS 11/02/21   Valentina Shaggy, MD    Family History Family History  Problem Relation Age of Onset   Cancer Paternal Grandmother        ovarian cancer   Other Mother        Ovarian Tumor   Hypertension Father    Asthma Son    ADD / ADHD Son    Asthma Son    Allergies Son    Colon cancer Other  late 79's   Diabetes Sister    Healthy Brother     Social History Social History   Tobacco Use   Smoking status: Former    Packs/day: 0.25    Years: 2.00    Total pack years: 0.50    Types: Cigarettes    Quit date: 04/04/2010    Years since quitting: 12.0   Smokeless tobacco: Never  Vaping Use   Vaping Use: Never used  Substance Use Topics   Alcohol use: No   Drug use: No     Allergies   Other, Shellfish allergy, Adhesive [tape], Eggs or egg-derived products, and Latex   Review of Systems Review of Systems PER HPI  Physical Exam Triage Vital Signs ED Triage Vitals  Enc Vitals Group     BP 04/28/22 1250 124/86     Pulse Rate 04/28/22 1250 87     Resp 04/28/22 1250 20     Temp 04/28/22 1250 98 F (36.7 C)     Temp Source 04/28/22 1250 Oral     SpO2 04/28/22 1250 93 %     Weight --      Height --      Head Circumference --      Peak Flow --      Pain Score 04/28/22 1345 1     Pain Loc --      Pain Edu? --      Excl. in [place]? --    No data found.  Updated Vital Signs BP 124/86 (BP Location: Right Arm)   Pulse 87   Temp 98 F (36.7 C) (Oral)   Resp 20   LMP 04/13/2022 (Approximate)   SpO2 93%   Visual Acuity Right Eye Distance:   Left Eye Distance:   Bilateral Distance:    Right Eye Near:   Left Eye Near:    Bilateral  Near:     Physical Exam Vitals and nursing note reviewed.  Constitutional:      Appearance: Normal appearance.  HENT:     Head: Atraumatic.     Right Ear: Tympanic membrane and external ear normal.     Left Ear: Tympanic membrane and external ear normal.     Nose: Congestion present.     Mouth/Throat:     Mouth: Mucous membranes are moist.     Pharynx: Posterior oropharyngeal erythema present.  Eyes:     Extraocular Movements: Extraocular movements intact.     Conjunctiva/sclera: Conjunctivae normal.  Cardiovascular:     Rate and Rhythm: Normal rate and regular rhythm.     Heart sounds: Normal heart sounds.  Pulmonary:     Effort: Pulmonary effort is normal.     Breath sounds: Wheezing present. No rales.  Musculoskeletal:        General: Normal range of motion.     Cervical back: Normal range of motion and neck supple.  Skin:    General: Skin is warm and dry.  Neurological:     Mental Status: She is alert and oriented to person, place, and time.  Psychiatric:        Mood and Affect: Mood normal.        Thought Content: Thought content normal.      UC Treatments / Results  Labs (all labs ordered are listed, but only abnormal results are displayed) Labs Reviewed - No data to display  EKG   Radiology DG Chest 2 View  Result Date: 04/28/2022 CLINICAL DATA:  Worsening cough. Wheezing. Chest tightness.  Shortness of breath. EXAM: CHEST - 2 VIEW COMPARISON:  Chest two views 08/11/2020 FINDINGS: The heart size and mediastinal contours are within normal limits. Both lungs are clear. The visualized skeletal structures are unremarkable. Cholecystectomy clips. IMPRESSION: No active cardiopulmonary disease. Electronically Signed   By: Yvonne Kendall M.D.   On: 04/28/2022 13:30    Procedures Procedures (including critical care time)  Medications Ordered in UC Medications  albuterol (PROVENTIL) (2.5 MG/3ML) 0.083% nebulizer solution 2.5 mg (2.5 mg Nebulization Given 04/28/22 1329)     Initial Impression / Assessment and Plan / UC Course  I have reviewed the triage vital signs and the nursing notes.  Pertinent labs & imaging results that were available during my care of the patient were reviewed by me and considered in my medical decision making (see chart for details).     Vital signs within normal limits today, significant improvement after albuterol nebulizer treatment today.  Chest x-ray today without acute abnormalities.  Treat with prednisone, Phenergan DM, albuterol inhaler, supportive over-the-counter medications and home care.  Return for worsening symptoms.  Final Clinical Impressions(s) / UC Diagnoses   Final diagnoses:  Influenza  Mild intermittent asthma with acute exacerbation   Discharge Instructions   None    ED Prescriptions     Medication Sig Dispense Auth. Provider   predniSONE (DELTASONE) 20 MG tablet Take 2 tablets (40 mg total) by mouth daily with breakfast. 10 tablet Volney American, PA-C   promethazine-dextromethorphan (PROMETHAZINE-DM) 6.25-15 MG/5ML syrup Take 5 mLs by mouth 4 (four) times daily as needed. 100 mL Volney American, [place]      PDMP not reviewed this encounter.   Volney American, [place] 04/28/22 1556

## 2022-04-28 NOTE — ED Triage Notes (Signed)
Pt reports her son had the flu they were all given tamiflu in her household. She took it for 6 days. She is experiencing chest aching x 1 day. She used her inhaler which helped slightly. SOB, coughing, runny nose, fatigue and she took nyquil and dayquil.

## 2022-05-30 ENCOUNTER — Encounter: Payer: Self-pay | Admitting: Internal Medicine

## 2022-06-06 NOTE — Progress Notes (Unsigned)
   CC:  headaches  Follow-up Visit  Last visit: 03/03/22  Brief HPI: 32 year old female with a history of asthma, PCOS, inappropriate sinus tachycardia who follows in clinic for hemiplegic migraine. MRI/MRA 09/14/21 was unremarkable.   At her last visit she was continued on Topamax and started on Cymbalta for migraine prevention. Nurtec was continued for rescue.  Interval History: Headaches***Topamax 50 mg BID***Cymbalta 20 mg daily***Nurtec***   Headache days per month: *** Migraine days per month*** Headache free days per month: ***  Current Headache Regimen: Preventative: *** Abortive: ***   Prior Therapies                                  Topamax 50 mg BID Metoprolol 12.5 mg TID - hypotension Cymbalta 20 mg daily Nurtec 75 mg PRN  Physical Exam:   Vital Signs: There were no vitals taken for this visit. GENERAL:  well appearing, in no acute distress, alert  SKIN:  Color, texture, turgor normal. No rashes or lesions HEAD:  Normocephalic/atraumatic. RESP: normal respiratory effort MSK:  No gross joint deformities.   NEUROLOGICAL: Mental Status: Alert, oriented to person, place and time, Follows commands, and Speech fluent and appropriate. Cranial Nerves: PERRL, face symmetric, no dysarthria, hearing grossly intact Motor: moves all extremities equally Gait: normal-based.  IMPRESSION: ***  PLAN: ***   Follow-up: ***  I spent a total of *** minutes on the date of the service. Headache education was done. Discussed lifestyle modification including increased oral hydration, decreased caffeine, exercise and stress management. Discussed treatment options including preventive and acute medications, natural supplements, and infusion therapy. Discussed medication overuse headache and to limit use of acute treatments to no more than 2 days/week or 10 days/month. Discussed medication side effects, adverse reactions and drug interactions. Written educational materials and  patient instructions outlining all of the above were given.  Genia Harold, MD

## 2022-06-07 ENCOUNTER — Ambulatory Visit (INDEPENDENT_AMBULATORY_CARE_PROVIDER_SITE_OTHER): Payer: 59 | Admitting: Psychiatry

## 2022-06-07 ENCOUNTER — Encounter: Payer: Self-pay | Admitting: Psychiatry

## 2022-06-07 VITALS — BP 134/84 | HR 83 | Ht 66.5 in | Wt 254.2 lb

## 2022-06-07 DIAGNOSIS — G43401 Hemiplegic migraine, not intractable, with status migrainosus: Secondary | ICD-10-CM | POA: Diagnosis not present

## 2022-06-07 MED ORDER — EMGALITY 120 MG/ML ~~LOC~~ SOAJ: 240.0000 mg | Freq: Once | SUBCUTANEOUS | 0 refills | Status: AC

## 2022-06-07 MED ORDER — EMGALITY 120 MG/ML ~~LOC~~ SOAJ: 1.0000 | SUBCUTANEOUS | 6 refills | Status: DC

## 2022-06-07 NOTE — Patient Instructions (Signed)
Start Emgality monthly injection for migraine prevention. Continue Topamax for now.  Continue Nurtec as needed for migraines

## 2022-06-17 ENCOUNTER — Other Ambulatory Visit: Payer: Self-pay | Admitting: Gastroenterology

## 2022-06-17 DIAGNOSIS — K219 Gastro-esophageal reflux disease without esophagitis: Secondary | ICD-10-CM

## 2022-06-17 DIAGNOSIS — R1010 Upper abdominal pain, unspecified: Secondary | ICD-10-CM

## 2022-06-23 ENCOUNTER — Encounter: Payer: Self-pay | Admitting: Radiology

## 2022-07-15 ENCOUNTER — Encounter: Payer: Self-pay | Admitting: Gastroenterology

## 2022-07-15 ENCOUNTER — Ambulatory Visit: Payer: 59 | Admitting: Gastroenterology

## 2022-07-15 VITALS — BP 129/78 | HR 62 | Temp 97.6°F | Ht 66.5 in | Wt 253.4 lb

## 2022-07-15 DIAGNOSIS — K219 Gastro-esophageal reflux disease without esophagitis: Secondary | ICD-10-CM

## 2022-07-15 DIAGNOSIS — R7989 Other specified abnormal findings of blood chemistry: Secondary | ICD-10-CM | POA: Diagnosis not present

## 2022-07-15 DIAGNOSIS — R1010 Upper abdominal pain, unspecified: Secondary | ICD-10-CM | POA: Diagnosis not present

## 2022-07-15 MED ORDER — ESOMEPRAZOLE MAGNESIUM 40 MG PO CPDR: DELAYED_RELEASE_CAPSULE | ORAL | 3 refills | Status: DC

## 2022-07-15 NOTE — Progress Notes (Signed)
GI Office Note    Referring Provider: Rosalee Kaufman, * Primary Care Physician:  Rosine Door  Primary Gastroenterologist: Garfield Cornea, MD   Chief Complaint   Chief Complaint  Patient presents with   Gastroesophageal Reflux    Needs refills on reflux meds    History of Present Illness   Dana Mcpherson is a 32 y.o. female presenting today for follow-up.  Last seen March 2023.  She has a history of GERD, dysphagia, abdominal pain, diarrhea, elevated LFTs.  Patient has numerous allergies both environmental and foods, carries EpiPen. Followed by allergy.   GERD: requires twice a day or has breakthrough symptoms. Will have vomiting and abdominal pain bad if she misses a dose. Avoiding trigger foods. Drinks only water. Weight stable. No dysphagia, vomiting.   Elevated LFTs: Labs from May 2023: Total bilirubin 0.2, alkaline phosphatase 83, AST 33, ALT 57.  Bowel habits: BMs a lot better since stopped eating, steak and beef. No longer having diarrhea. No melena, brbpr.   Wt Readings from Last 10 Encounters:  07/15/22 253 lb 6.4 oz (114.9 kg)  06/07/22 254 lb 3.2 oz (115.3 kg)  03/03/22 250 lb (113.4 kg)  02/28/22 250 lb (113.4 kg)  12/20/21 253 lb 6 oz (114.9 kg)  10/22/21 250 lb (113.4 kg)  09/23/21 247 lb (112 kg)  09/14/21 246 lb 14.6 oz (112 kg)  08/13/21 247 lb (112 kg)  08/02/21 247 lb 12.8 oz (112.4 kg)   Previous work up:   EGD 04/08/19 with erosive reflux esophagitis s/p dilation, stomach with abnormal congestion diffusely with snakeskin appearance, antral erosions, patent pylorus, congested eroded duodenal bulb, otherwise normal.    No specimens collected.     Colonoscopy February 2021 with normal appearing TI, one tubular adenoma in cecum, segmental biopsies benign, stool sample submitted for C. Diff negative. Suspect rectal bleeding was trivial anorectal bleeding.  Due for repeat colonoscopy in 2028.    LFTs had been mildly elevated in  2010, normalized in 2012, but ALT became mildly elevated in July 2020 with persistent elevation. Extensive serologic evaluation has been completed including negative hepatitis A, B, and C, immune to Hep B, no immunity to hepatitis A, iron panel WNL, PT/INR normal, immunoglobulins, ANA, AMA, ASMA within normal limits.  No evidence of alpha-1 antitrypsin deficiency.  Ceruloplasmin elevated.  Follow-up 24-hour urine copper within normal limits. Abdominal ultrasound November 2020 unremarkable.  Prior ultrasound in January 2019 suggestive of hepatic steatosis. Last labs in 10/2020 with ALT at 42 normal and  AST and alk phos wnl.    Celiac screen was negative.  H. pylori serologies negative.  CRP elevated at 43.5, sed rate elevated at 53, alpha gal panel negative. Patient never had fecal calprotectin or H. pylori testing completed. CRP chronically elevated over the past year, most recently 45.8.   Gastric emptying study December 2021: Normal   CT abdomen pelvis with contrast January 2021: Status postcholecystectomy, no acute findings.   Has been seen by Dr. Ernst Bowler, positive ANA, normal serum tryptase, shellfish panel shows slightly elevated to crab and shrimp, advised to avoid if she has reacted in the past but could be false positives.  Environmental allergy to dust mites and cockroach.  Negative alpha gal.      Medications   Current Outpatient Medications  Medication Sig Dispense Refill   albuterol (VENTOLIN HFA) 108 (90 Base) MCG/ACT inhaler Inhale 1-2 puffs into the lungs every 6 (six) hours as needed. 18 g  1   cetirizine (ZYRTEC) 10 MG tablet Take 2 tablets (20 mg total) by mouth 2 (two) times daily. 120 tablet 5   EPINEPHrine (EPIPEN 2-PAK) 0.3 mg/0.3 mL IJ SOAJ injection Inject 0.3 mg into the muscle once as needed for anaphylaxis. 2 each 1   esomeprazole (NEXIUM) 40 MG capsule Take 40mg  once to twice daily before meals. 60 capsule 11   Flaxseed, Linseed, (FLAXSEED OIL) 1200 MG CAPS Take  1,200 mg by mouth 2 (two) times daily.     Galcanezumab-gnlm (EMGALITY) 120 MG/ML SOAJ Inject 1 Pen into the skin every 30 (thirty) days. 1.12 mL 6   ivabradine (CORLANOR) 7.5 MG TABS tablet Take 1 tablet (7.5 mg total) by mouth 2 (two) times daily with a meal. 180 tablet 3   levonorgestrel-ethinyl estradiol (SEASONALE) 0.15-0.03 MG tablet Take 1 tablet by mouth daily. 91 tablet 3   ondansetron (ZOFRAN) 8 MG tablet Take 1 tablet (8 mg total) by mouth every 8 (eight) hours as needed for nausea or vomiting. 20 tablet 5   promethazine (PHENERGAN) 25 MG tablet Take 1/2 to 1 tablet every six hours as needed for nausea or vomiting. Use sparingly. 20 tablet 0   Rimegepant Sulfate (NURTEC) 75 MG TBDP Take 75 mg by mouth as needed (for migraine). Max dose 1 pill in 24 hours 8 tablet 6   triamcinolone cream (KENALOG) 0.1 % Apply topically.     XOLAIR 150 MG/ML prefilled syringe INJECT 300MG  SUBCUTANEOUSLY EVERY 28 DAYS 2 mL 11   No current facility-administered medications for this visit.    Allergies   Allergies as of 07/15/2022 - Review Complete 07/15/2022  Allergen Reaction Noted   Other Anaphylaxis and Other (See Comments) 08/30/2012   Shellfish allergy Anaphylaxis and Hives 05/20/2013   Adhesive [tape] Hives 10/10/2014   Egg-derived products Itching and Nausea Only 07/05/2012   Latex Itching 11/04/2014      Review of Systems   General: Negative for anorexia, weight loss, fever, chills, fatigue, weakness. ENT: Negative for hoarseness, difficulty swallowing , nasal congestion. CV: Negative for chest pain, angina, palpitations, dyspnea on exertion, peripheral edema.  Respiratory: Negative for dyspnea at rest, dyspnea on exertion, cough, sputum, wheezing.  GI: See history of present illness. GU:  Negative for dysuria, hematuria, urinary incontinence, urinary frequency, nocturnal urination.  Endo: Negative for unusual weight change.     Physical Exam   BP 129/78 (BP Location: Right Arm,  Patient Position: Sitting, Cuff Size: Large)   Pulse 62   Temp 97.6 F (36.4 C) (Oral)   Ht 5' 6.5" (1.689 m)   Wt 253 lb 6.4 oz (114.9 kg)   LMP 07/15/2022 (Exact Date)   SpO2 97%   BMI 40.29 kg/m    General: Well-nourished, well-developed in no acute distress.  Eyes: No icterus. Mouth: Oropharyngeal mucosa moist and pink   Abdomen: Bowel sounds are normal, nontender, nondistended, no hepatosplenomegaly or masses,  no abdominal bruits or hernia , no rebound or guarding.  Rectal: not performed Extremities: No lower extremity edema. No clubbing or deformities. Neuro: Alert and oriented x 4   Skin: Warm and dry, no jaundice.   Psych: Alert and cooperative, normal mood and affect.  Labs   Lab Results  Component Value Date   ALT 57 (H) 09/14/2021   AST 33 09/14/2021   ALKPHOS 83 09/14/2021   BILITOT 0.2 (L) 09/14/2021   Lab Results  Component Value Date   CREATININE 0.91 09/14/2021   BUN 9 09/14/2021   NA  138 09/14/2021   K 3.5 09/14/2021   CL 109 09/14/2021   CO2 21 (L) 09/14/2021   Lab Results  Component Value Date   WBC 14.1 (H) 09/14/2021   HGB 13.8 09/14/2021   HCT 42.9 09/14/2021   MCV 88.1 09/14/2021   PLT 356 09/14/2021    Imaging Studies   No results found.  Assessment   GERD/upper abdominal pain: controlled on PPI BID. Discussed antireflux measures. Discussed TIF procedure for management of GERD. At this time she is borderline candidate due to her BMI.   Elevated LFTs: likely due to fatty liver. She is due for upcoming labs, will follow.    PLAN   Continue esomeprazole 40mg  twice daily before a meal. Follow up upcoming labs by PCP, continue to follow LFTs at least yearly.  Reinforced need to strive for weight management, low fat diet, limit etoh, exercise as per direction of her cardiologist.  Return ov in one year.   Laureen Ochs. Bobby Rumpf, [place], [facility]

## 2022-07-15 NOTE — Patient Instructions (Addendum)
Continue esomeprazole 40mg  before breakfast and evening meal.  I will follow up labs in April to be done by PCP.  I suspect your elevated liver numbers are due to fatty liver. Handout provided. Strive for weight reduction, healthy eating, exercise according to your cardiology recommendations/limitations, restrict alcohol. Reading material regarding TIF procedure for reflux provided today. Call if you have any questions. Return office visit in one year or sooner if needed.

## 2022-08-29 ENCOUNTER — Ambulatory Visit: Payer: 59 | Admitting: Internal Medicine

## 2022-08-29 VITALS — BP 122/78 | HR 135 | Temp 98.3°F | Resp 20 | Ht 67.0 in | Wt 256.8 lb

## 2022-08-29 DIAGNOSIS — L501 Idiopathic urticaria: Secondary | ICD-10-CM

## 2022-08-29 DIAGNOSIS — L2089 Other atopic dermatitis: Secondary | ICD-10-CM

## 2022-08-29 DIAGNOSIS — J4520 Mild intermittent asthma, uncomplicated: Secondary | ICD-10-CM | POA: Diagnosis not present

## 2022-08-29 MED ORDER — TACROLIMUS 0.1 % EX OINT: TOPICAL_OINTMENT | Freq: Two times a day (BID) | CUTANEOUS | 5 refills | Status: DC

## 2022-08-29 MED ORDER — CETIRIZINE HCL 10 MG PO TABS: 20.0000 mg | ORAL_TABLET | Freq: Two times a day (BID) | ORAL | 5 refills | Status: DC

## 2022-08-29 MED ORDER — EPINEPHRINE 0.3 MG/0.3ML IJ SOAJ: 0.3000 mg | Freq: Once | INTRAMUSCULAR | 1 refills | Status: AC | PRN

## 2022-08-29 MED ORDER — HYDROCORTISONE 2.5 % EX CREA: TOPICAL_CREAM | Freq: Two times a day (BID) | CUTANEOUS | 5 refills | Status: DC

## 2022-08-29 MED ORDER — TRIAMCINOLONE ACETONIDE 0.1 % EX OINT: TOPICAL_OINTMENT | CUTANEOUS | 5 refills | Status: DC

## 2022-08-29 NOTE — Progress Notes (Addendum)
FOLLOW UP Date of Service/Encounter:  08/29/22   Subjective:  Dana Mcpherson (DOB: 05/02/1990) is a 32 y.o. female [facility] for follow up for an acute visit due to hives/rash.   History obtained from: chart review and patient. Last visit was with Dr. Dellis Anes 04/22/2022 for mild intermittent asthma, chronic rhinitis, food allergies, CSU.  On Xolair, Zyrtec.  Avoiding fish, shellfish, egg; has an Epipen.  Also discussed immune evaluation at last visit with lab form given.   Rash:  Reports being on Xolair every 4 weeks and Zyrtec 20mg  BID but still breaking out in rash.  Reports a dry rough bumpy itchy rash on chin and face and sometimes arms.  Has tried moisturizing with lotion and avoiding fragrance products without much improvement. Previously saw Dermatology also as she has a history of eczema.  Using aveeno lotion for moisturizing.  No changes in products. No new medications. Does not use triamcinolone on face but does use it on arms when she flares up.  Was given Protopic which she uses for her arms but has not tried for face.    Past Medical History: Past Medical History:  Diagnosis Date   Allergy    Anxiety    Asthma    Autoimmune disorder (HCC)    Complication of anesthesia    woke up during surgery & ithing after surgery   Family history of adverse reaction to anesthesia    "My mom has the same trouble with anesthesia".   GERD (gastroesophageal reflux disease)    Migraine headache    Nexplanon in place 10/05/2015   06/29/15 left arm   Polycystic ovarian syndrome    Pregnancy induced hypertension    Tachycardia    Urticaria    Vaginal Pap smear, abnormal     Objective:  BP 122/78   Pulse (!) 135   Temp 98.3 F (36.8 C) (Temporal)   Resp 20   Ht 5\' 7"  (1.702 m)   Wt 256 lb 12.8 oz (116.5 kg)   SpO2 96%   BMI 40.22 kg/m  Body mass index is 40.22 kg/m. Physical Exam: GEN: alert, well developed HEENT: clear  conjunctiva, TM grey and translucent, nose with mild inferior turbinate hypertrophy, pink nasal mucosa, no rhinorrhea, no cobblestoning HEART: regular rate and rhythm, no murmur LUNGS: clear to auscultation bilaterally, no coughing, unlabored respiration SKIN: no hives on exam, erythematous dry rough rash on chin and bl cheeks and arms   Spirometry:  Tracings reviewed. Her effort: Variable effort-results affected. FVC: 3.03L FEV1: 2.60L, 75% predicted FEV1/FVC ratio: 86% Interpretation: Spirometry consistent with possible restrictive disease.  Please see scanned spirometry results for details.  Assessment:   1. Idiopathic urticaria   2. Other atopic dermatitis     Plan/Recommendations:  Eczema Possible Contact Dermatitis - Discussed the current rash is not hives, possibly eczematous or contact derm.  Will try topical steroids and Protopic. Consider patch testing if no improvement.   - Do a daily soaking tub bath in warm water for 10-15 minutes.  - Use a gentle, unscented cleanser at the end of the bath (such as Dove unscented bar or baby wash, or Aveeno sensitive body wash). Then rinse, pat half-way dry, and apply a gentle, unscented moisturizer cream or ointment (Cerave, Cetaphil, Eucerin, Aveeno)  all over while still damp. Dry skin makes the itching and rash of eczema worse. The skin should be moisturized with a gentle, unscented moisturizer at least twice  daily.  - Use only unscented liquid laundry detergent. - Apply prescribed topical steroid (triamcinolone 0.1% below neck or hydrocortisone 2.5% above neck) to flared areas (red and thickened eczema) after the moisturizer has soaked into the skin (wait at least 30 minutes). Taper off the topical steroids as the skin improves. Do not use topical steroid for more than 7-10 days at a time.  - Put Protopic onto areas of rough eczema (that is not red) twice a day. May decrease to once a day as the eczema improves. This will not thin the  skin, and is safe for chronic use. Do not put this onto normal appearing skin.  Chronic Idiopathic Urticaria (Hives)  - Controlled - Continue Xolair 300mg  every 4 weeks. Keep Epipen. - Continue Cetirizine 20mg  tabs twice a day.   - May use Benadryl (diphenhydramine) as needed for breakthrough hives  Asthma - I think that Xolair is helping with your asthma. Spirometry today did not show obstruction, possible restriction likely due to weight or technique.   - Continue with Xopenex (levalbuterol) or albuterol as needed.   Allergic rhinitis - Continue allergen avoidance measures directed toward dust mite and cockroach - Continue cetirizine 20mg  twice daily.  Dosing for hives.   - Consider saline nasal rinses as needed for nasal symptoms. Use this before any medicated nasal sprays for best result  Food allergy (seafood, egg) - Continue to avoid fish, shellfish, and egg.   - In case of an allergic reaction, take Benadryl 50 mg every 4 hours, and if life-threatening symptoms occur, inject with EpiPen 0.3 mg.  Recurrent infections - Keep track of infections and antibiotics used - If you have any infections or antibiotic needs in future have labs drawn.  - You had immune labs ordered in April 2023 that you can still get drawn.    Follow up with Dr. Dellis Anes in June 2024.     No follow-ups on file.  Alesia Morin, MD [facility]

## 2022-08-29 NOTE — Patient Instructions (Addendum)
Eczema Possible Contact Dermatitis - Discussed the current rash is not hives, possibly eczematous or contact derm.  Will try topical steroids and Protopic. Consider patch testing if no improvement.   - Do a daily soaking tub bath in warm water for 10-15 minutes.  - Use a gentle, unscented cleanser at the end of the bath (such as Dove unscented bar or baby wash, or Aveeno sensitive body wash). Then rinse, pat half-way dry, and apply a gentle, unscented moisturizer cream or ointment (Cerave, Cetaphil, Eucerin, Aveeno)  all over while still damp. Dry skin makes the itching and rash of eczema worse. The skin should be moisturized with a gentle, unscented moisturizer at least twice daily.  - Use only unscented liquid laundry detergent. - Apply prescribed topical steroid (triamcinolone 0.1% below neck or hydrocortisone 2.5% above neck) to flared areas (red and thickened eczema) after the moisturizer has soaked into the skin (wait at least 30 minutes). Taper off the topical steroids as the skin improves. Do not use topical steroid for more than 7-10 days at a time.  - Put Protopic onto areas of rough eczema (that is not red) twice a day. May decrease to once a day as the eczema improves. This will not thin the skin, and is safe for chronic use. Do not put this onto normal appearing skin.  Chronic Idiopathic Urticaria (Hives)  - Continue Xolair 300mg  every 4 weeks. Keep Epipen. - Continue Cetirizine 20mg  tabs twice a day.   - May use Benadryl (diphenhydramine) as needed for breakthrough hives  Asthma - I think that Xolair is helping with your asthma. - Continue with Xopenex (levalbuterol) or albuterol as needed.   Allergic rhinitis - Continue allergen avoidance measures directed toward dust mite and cockroach - Continue cetirizine 20mg  twice daily.  Dosing for hives.   - Consider saline nasal rinses as needed for nasal symptoms. Use this before any medicated nasal sprays for best result  Food allergy  (seafood, egg) - Continue to avoid fish, shellfish, and egg.   - In case of an allergic reaction, take Benadryl 50 mg every 4 hours, and if life-threatening symptoms occur, inject with EpiPen 0.3 mg.  Recurrent infections - Keep track of infections and antibiotics used - If you have any infections or antibiotic needs in future have labs drawn.  - You had immune labs ordered in April 2023 that you can still get drawn.    Follow up with Dr. Dellis Anes in June 2024.

## 2022-08-29 NOTE — Addendum Note (Signed)
Addended by: Elsworth Soho on: 08/29/2022 05:05 PM   Modules accepted: Orders

## 2022-09-07 ENCOUNTER — Telehealth: Payer: Self-pay | Admitting: Allergy & Immunology

## 2022-09-07 MED ORDER — METRONIDAZOLE 1 % EX GEL: Freq: Every day | CUTANEOUS | 5 refills | Status: DC

## 2022-09-07 MED ORDER — DOXYCYCLINE HYCLATE 50 MG PO CAPS: 50.0000 mg | ORAL_CAPSULE | Freq: Two times a day (BID) | ORAL | 2 refills | Status: AC

## 2022-09-07 NOTE — Telephone Encounter (Signed)
I saw the patient in the clinic when she was here with her son. She had a rash c/w rosacea. I am sending in doxycyline BID as well as Metrogel to see if this provides any relief.   Malachi Bonds, MD [facility]

## 2022-10-21 ENCOUNTER — Other Ambulatory Visit: Payer: Self-pay

## 2022-10-21 ENCOUNTER — Ambulatory Visit: Payer: 59 | Admitting: Allergy & Immunology

## 2022-10-21 ENCOUNTER — Encounter: Payer: Self-pay | Admitting: Allergy & Immunology

## 2022-10-21 VITALS — BP 122/74 | HR 121 | Temp 98.2°F | Resp 16 | Ht 67.0 in | Wt 253.5 lb

## 2022-10-21 DIAGNOSIS — T7800XD Anaphylactic reaction due to unspecified food, subsequent encounter: Secondary | ICD-10-CM | POA: Diagnosis not present

## 2022-10-21 DIAGNOSIS — J4520 Mild intermittent asthma, uncomplicated: Secondary | ICD-10-CM

## 2022-10-21 DIAGNOSIS — L501 Idiopathic urticaria: Secondary | ICD-10-CM | POA: Diagnosis not present

## 2022-10-21 DIAGNOSIS — J3089 Other allergic rhinitis: Secondary | ICD-10-CM

## 2022-10-21 DIAGNOSIS — L2089 Other atopic dermatitis: Secondary | ICD-10-CM

## 2022-10-21 DIAGNOSIS — B999 Unspecified infectious disease: Secondary | ICD-10-CM

## 2022-10-21 NOTE — Progress Notes (Unsigned)
FOLLOW UP  Date of Service/Encounter:  10/21/22   Assessment:   Mild intermittent asthma, uncomplicated   Chronic rhinitis   Anaphylactic shock due to food   Chronic urticaria with overlying eczema - changing from Xolair to Dupixent today to try to get the eczema under control   Joint pains in hands/wrist - follows with Dr. Sheliah Hatch   Tachycardia - exacerbated with albuterol use   Full vaccinated with Moderna  Plan/Recommendations:   Asthma - Lung function looks great today. - Continue with Xopenex (levalbuterol) or albuterol as needed.   2. Allergic rhinitis - Continue allergen avoidance measures directed toward dust mite and cockroach - Continue cetirizine 10mg  daily.  - Consider saline nasal rinses as needed for nasal symptoms. Use this before any medicated nasal sprays for best result  3. Hives (urticaria) - We are going to change from Xolair to Dupixent for hives control (this has been approved in [place] for hive treatment). - Continue taking cetirizine 2 tabs twice a day.  - May use Benadryl (diphenhydramine) as needed for breakthrough hives  4. Dry skin in the presence of eczema - We are going to start Dupixent for control of your skin. - Consent signed today.  - I would like to get patch testing to see if we can figure out what chemical is causing your dry skin.  - This might help guide our management.   4. Food allergy (seafood, egg) - Continue to avoid fish, shellfish, and egg.   - In case of an allergic reaction, take Benadryl 50 mg every 4 hours, and if life-threatening symptoms occur, inject with EpiPen 0.3 mg.  5. Recurrent infections - Keep track of infections and antibiotics used - If you have any infections or antibiotic needs in future have labs drawn.   6. Return in about 6 months (around 04/22/2023).   Subjective:   Dana Mcpherson Mcpherson is a 32 y.o. female presenting today for follow up of  Chief Complaint  Patient presents with    Follow-up    Rash on face,arms,legs that is now flaking. Breathing gets a bit harder since it is hotter, she tries to stay indoors     Dana Mcpherson Mcpherson has a history of the following: Patient Active Problem List   Diagnosis Date Noted   Idiopathic urticaria 08/02/2021   Recurrent infections 08/02/2021   Rash and other nonspecific skin eruption 03/01/2021   Encounter for sterilization    Positive ANA (antinuclear antibody) 08/31/2020   Weakness of both hands 08/31/2020   Upper back pain 08/31/2020   Mild intermittent asthma without complication 06/12/2020   Perennial allergic rhinitis 06/12/2020   Anaphylactic shock due to adverse food reaction 06/12/2020   Chronic urticaria 06/12/2020   Early satiety 03/11/2020   Abdominal pain 05/24/2019   Abdominal bloating 02/20/2019   Elevated LFTs 02/20/2019   Gastroesophageal reflux disease 02/20/2019   Dysphagia 02/20/2019   Diarrhea 02/20/2019   Hemorrhoid 06/29/2015   Hidradenitis suppurativa 06/23/2015   Shortness of breath 11/04/2014   Inappropriate sinus tachycardia 10/07/2014   ASCUS with positive high risk HPV 05/28/2014   PCOS (polycystic ovarian syndrome) 03/03/2014   Hyperinsulinemia 03/03/2014   Morbid obesity (HCC) 05/23/2013   DUB (dysfunctional uterine bleeding) 05/23/2013    History obtained from: chart review and patient.  Dana Mcpherson is a 32 y.o. female presenting for a follow up visit.  She was last seen in May 2024.  At that time, she was still having urticaria on Xolair and Zyrtec  20 mg twice daily.  She was continued on the same regimen.  She was continued on Xopenex as needed.  For her allergic rhinitis, we continue with avoidance measures.  She continue to avoid seafood and egg.  Since last visit, she has been about the same.   Asthma/Respiratory Symptom History: She has  been doing well with regards to her asthma.  [name]'s asthma has been well controlled. She has not required rescue medication, experienced nocturnal  awakenings due to lower respiratory symptoms, nor have activities of daily living been limited. She has required no Emergency Department or Urgent Care visits for her asthma. She has required zero courses of systemic steroids for asthma exacerbations since the last visit. ACT score today is 25, indicating excellent asthma symptom control.   Allergic Rhinitis Symptom History: Allergic rhinitis is well controlled with cetirizine daily as well as nasal saline rinses. She has not been on antibiotics for any infections at all since the last visit.   Food Allergy Symptom History: She continues to avoid egg as well as seafood. EpiPen is up to date.  Last testing was done in March 2022 and was slightly positive to crab as well as shrimp. Egg component testing was negative at the time, but she prefers to continue to avoid it. EpiPen is up to date.   Skin Symptom History: She continues to have a rash that looks like sunburn. It is peeling as well. She was started on Protopic by Dr. Allena Katz. She went to see Dr. Scharlene Gloss office and saw [name]. She was diagnosed with rosacea.  This is different than the rosacea. She stopped using vitamins to see if this was related; nothing changed. She has never done patch testing at all. She denies being part lizard! She has been diagnosed with eczema in the past, but this is different than it. Some of these lesions bruise per the patient (although that is not present today). She has pictures that show very erythematous confluent rashes.   She was told to not use lotion because it is more likely to have alcohol in it. She is using Cetaphil cream and she was using Aveeno before that. She has been on hydrocortisone, tacrolimus, and two strengths of triamcinolone in the past.   She remains on the Xolair for her hives. This is working well for the hives, but it does not do anything for her other rashes (which have been called eczema by Dermatologists in the past). She remains on the  two tablets twice daily of the cetirizine. She has been stable on this for years.   Otherwise, there have been no changes to her past medical history, surgical history, family history, or social history.    Review of Systems  Constitutional: Negative.  Negative for chills, fever, malaise/fatigue and weight loss.  HENT: Negative.  Negative for congestion, ear discharge, ear pain and sinus pain.   Eyes:  Negative for pain, discharge and redness.  Respiratory:  Negative for cough, sputum production, shortness of breath and wheezing.   Cardiovascular: Negative.  Negative for chest pain and palpitations.  Gastrointestinal:  Negative for abdominal pain, diarrhea, heartburn, nausea and vomiting.  Skin:  Positive for itching and rash.  Neurological:  Negative for dizziness and headaches.  Endo/Heme/Allergies:  Positive for environmental allergies. Does not bruise/bleed easily.       Objective:   Blood pressure 122/74, pulse (!) 121, temperature 98.2 F (36.8 C), resp. rate 16, height 5\' 7"  (1.702 m), weight 253 lb 8 oz (  115 kg), SpO2 97 %. Body mass index is 39.7 kg/m.    Physical Exam Vitals reviewed.  Constitutional:      Appearance: She is well-developed.     Comments: Talkative.  Well-appearing.  HENT:     Head: Normocephalic and atraumatic.     Right Ear: Tympanic membrane, ear canal and external ear normal.     Left Ear: Tympanic membrane, ear canal and external ear normal.     Nose: Mucosal edema and rhinorrhea present. No nasal deformity or septal deviation.     Right Turbinates: Enlarged, swollen and pale.     Left Turbinates: Enlarged, swollen and pale.     Right Sinus: No maxillary sinus tenderness or frontal sinus tenderness.     Left Sinus: No maxillary sinus tenderness or frontal sinus tenderness.     Mouth/Throat:     Mouth: Mucous membranes are not pale and not dry.     Pharynx: Uvula midline.     Comments: Cobblestoning in the posterior oropharynx.  Eyes:      General: Lids are normal. Allergic shiner present.        Right eye: No discharge.        Left eye: No discharge.     Conjunctiva/sclera: Conjunctivae normal.     Right eye: Right conjunctiva is not injected. No chemosis.    Left eye: Left conjunctiva is not injected. No chemosis.    Pupils: Pupils are equal, round, and reactive to light.  Cardiovascular:     Rate and Rhythm: Normal rate and regular rhythm.     Heart sounds: Normal heart sounds.  Pulmonary:     Effort: Pulmonary effort is normal. No tachypnea, accessory muscle usage or respiratory distress.     Breath sounds: Normal breath sounds. No wheezing, rhonchi or rales.  Chest:     Chest wall: No tenderness.  Lymphadenopathy:     Cervical: No cervical adenopathy.  Skin:    General: Skin is warm.     Capillary Refill: Capillary refill takes less than 2 seconds.     Coloration: Skin is not pale.     Findings: No abrasion, erythema, petechiae or rash. Rash is not papular, urticarial or vesicular.     Comments: She has erythematous pustules over her upper chest. She has some similar appearing lesions on her arms as well as some eczematous lesions on her wrists.   Neurological:     Mental Status: She is alert.  Psychiatric:        Behavior: Behavior is cooperative.      Diagnostic studies:    Spirometry: results normal (FEV1: 2.48/71%, FVC: 2.89/69%, FEV1/FVC: 86%).    Spirometry consistent with possible restrictive disease.   Allergy Studies: none        Malachi Bonds, MD  [facility]

## 2022-10-21 NOTE — Patient Instructions (Addendum)
Asthma - Lung function looks great today. - Continue with Xopenex (levalbuterol) or albuterol as needed.   2. Allergic rhinitis - Continue allergen avoidance measures directed toward dust mite and cockroach - Continue cetirizine 10mg  daily.  - Consider saline nasal rinses as needed for nasal symptoms. Use this before any medicated nasal sprays for best result  3. Hives (urticaria) - We are going to change from Xolair to Dupixent for hives control (this has been approved in [place] for hive treatment). - Continue taking cetirizine 2 tabs twice a day.  - May use Benadryl (diphenhydramine) as needed for breakthrough hives  4. Dry skin in the presence of eczema - We are going to start Dupixent for control of your skin. - Consent signed today.  - I would like to get patch testing to see if we can figure out what chemical is causing your dry skin.  - This might help guide our management.   4. Food allergy (seafood, egg) - Continue to avoid fish, shellfish, and egg.   - In case of an allergic reaction, take Benadryl 50 mg every 4 hours, and if life-threatening symptoms occur, inject with EpiPen 0.3 mg.  5. Recurrent infections - Keep track of infections and antibiotics used - If you have any infections or antibiotic needs in future have labs drawn.   6. Return in about 6 months (around 04/22/2023).    Please inform us of any Emergency Department visits, hospitalizations, or changes in symptoms. Call us before going to the ED for breathing or allergy symptoms since we might be able to fit you in for a sick visit. Feel free to contact us anytime with any questions, problems, or concerns.  It was a pleasure to see you again today!  Websites that have reliable patient information: 1. American Academy of Asthma, Allergy, and Immunology: www.aaaai.org 2. Food Allergy Research and Education (FARE): foodallergy.org 3. Mothers of Asthmatics: http://www.asthmacommunitynetwork.org 4. American  College of Allergy, Asthma, and Immunology: www.acaai.org   COVID-19 Vaccine Information can be found at: [email or web address] For questions related to vaccine distribution or appointments, please email [email or web address] or call [phone].   We realize that you might be concerned about having an allergic reaction to the COVID19 vaccines. To help with that concern, WE ARE OFFERING THE COVID19 VACCINES IN OUR OFFICE! Ask the front desk for dates!     "Like" [place] on Facebook and Instagram for our latest updates!      A healthy democracy works best when [employer] participate! Make sure you are registered to vote! If you have moved or changed any of your contact information, you will need to get this updated before voting!  In some cases, you MAY be able to register to vote online: [email or web address]

## 2022-10-24 ENCOUNTER — Ambulatory Visit (INDEPENDENT_AMBULATORY_CARE_PROVIDER_SITE_OTHER): Payer: 59

## 2022-10-24 DIAGNOSIS — L209 Atopic dermatitis, unspecified: Secondary | ICD-10-CM

## 2022-10-24 MED ORDER — DUPILUMAB 300 MG/2ML ~~LOC~~ SOSY
300.0000 mg | PREFILLED_SYRINGE | SUBCUTANEOUS | Status: AC
  Administered 2022-10-24: 300 mg via SUBCUTANEOUS

## 2022-10-24 NOTE — Progress Notes (Signed)
Immunotherapy   Patient Details  Name: Dana Mcpherson MRN: [id number] Date of Birth: [birth year]  10/24/2022  Dana Mcpherson started injections for  Dupixent Following schedule: Every fourteen days Frequency: Every two weeks Epi-Pen:Epi-Pen Available  Consent signed in office previously and patient instructions given. Patient sat in the lobby for thirty minutes without an issue.    Ralene Muskrat 10/24/2022, 9:31 AM

## 2022-10-26 ENCOUNTER — Telehealth: Payer: Self-pay | Admitting: *Deleted

## 2022-10-26 MED ORDER — DUPIXENT 300 MG/2ML ~~LOC~~ SOSY: 300.0000 mg | PREFILLED_SYRINGE | SUBCUTANEOUS | 11 refills | Status: DC

## 2022-10-26 NOTE — Telephone Encounter (Signed)
-----   Message from Alfonse Spruce, MD sent at 10/23/2022  6:58 AM EDT ----- Consent signed for changing to Dupixent for AD.

## 2022-10-26 NOTE — Telephone Encounter (Signed)
Called patient and advised approval copay card and submit for Dupixent to [facility] with intrux on delivery,storage and dosing for same

## 2022-10-26 NOTE — Telephone Encounter (Signed)
Super - thanks!   Malachi Bonds, MD [facility]

## 2023-01-17 ENCOUNTER — Telehealth: Payer: 59 | Admitting: Psychiatry

## 2023-01-17 ENCOUNTER — Telehealth: Payer: Self-pay | Admitting: Psychiatry

## 2023-01-17 DIAGNOSIS — G43401 Hemiplegic migraine, not intractable, with status migrainosus: Secondary | ICD-10-CM | POA: Diagnosis not present

## 2023-01-17 MED ORDER — METHOCARBAMOL 500 MG PO TABS: 500.0000 mg | ORAL_TABLET | Freq: Every evening | ORAL | 6 refills | Status: DC | PRN

## 2023-01-17 MED ORDER — UBRELVY 100 MG PO TABS: 100.0000 mg | ORAL_TABLET | ORAL | 6 refills | Status: DC | PRN

## 2023-01-17 MED ORDER — TOPIRAMATE 50 MG PO TABS: ORAL_TABLET | ORAL | 6 refills | Status: DC

## 2023-01-17 NOTE — Telephone Encounter (Signed)
..  Pt understands that although there may be some limitations with this type of visit, we will take all precautions to reduce any security or privacy concerns.  Pt understands that this will be treated like an in office visit and we will file with pt's insurance, and there may be a patient responsible charge related to this service. ? ?

## 2023-01-17 NOTE — Progress Notes (Signed)
   Virtual Visit via Video Note  I connected with Dana Mcpherson on 01/17/23 at  3:30 PM EDT by a video enabled telemedicine application and verified that I am speaking with the correct person using two identifiers.  Location: Patient: home Provider: office   I discussed the limitations of evaluation and management by telemedicine and the availability of in person appointments. The patient expressed understanding and agreed to proceed.  CC:  headaches  Follow-up Visit  Last visit: 06/07/22  Brief HPI: 32 year old female with a history of asthma, PCOS, inappropriate sinus tachycardia who follows in clinic for hemiplegic migraine. MRI/MRA 09/14/21 was unremarkable.   At her last visit she was started on Emgality for migraine prevention. Topamax and Nurtec were continued.  Interval History: She has a bad migraine earlier this month which was associated with weakness and paresthesias. She took Nurtec which helped a little but did not fully resolve the headache. She had some remaining Robaxin from last year so she took some of this which did help. Her headache lasted for 7 days.  Continues to take Topamax 50 mg BID. Paresthesias have improved. She never received Emgality.  Migraine days per month: 7 Headache free days per month: 23  Current Headache Regimen: Preventative: Topamax 50 mg BID Abortive: Nurtec 75 mg PRN   Prior Therapies                                  Topamax 50 mg BID - felt unwell at higher doses Metoprolol 12.5 mg TID - hypotension Cymbalta 20 mg daily - worsened headaches Nurtec 75 mg PRN  Physical Exam:   GENERAL:  well appearing, in no acute distress, alert  HEAD:  Normocephalic/atraumatic.   NEUROLOGICAL: Mental Status: Alert, oriented to person, place and time, Follows commands, and Speech fluent and appropriate. Cranial Nerves: face symmetric, no dysarthria, hearing grossly intact Motor: moves all extremities equally  IMPRESSION: 32 year old  female with a history of asthma, PCOS, inappropriate sinus tachycardia who presents for follow up of migraines. She was never able to pick up Emgality. However her side effects on Topamax have improved. Will increase Topamax to 50/100. Nurtec was somewhat helpful but did not resolve her headache. Will try Ubrelvy for rescue and add Robaxin as needed.  PLAN: -Prevention: Increase Topamax to 50/100 (avoid doses >200 mg/day while on OCPs) -Rescue: Start Ubrelvy 100 mg PRN. Start Robaxin 500 mg PRN  Follow-up: 6 months  I spent a total of 20 minutes on the date of the service. Headache education was done. Discussed treatment options including preventive and acute medications. Discussed medication side effects, adverse reactions and drug interactions. Written educational materials and patient instructions outlining all of the above were given.  Ocie Doyne, MD 01/17/23 3:50 PM

## 2023-05-03 ENCOUNTER — Ambulatory Visit: Payer: 59 | Admitting: Allergy & Immunology

## 2023-06-20 ENCOUNTER — Encounter: Payer: Self-pay | Admitting: Gastroenterology

## 2023-06-23 ENCOUNTER — Telehealth: Payer: Self-pay | Admitting: Internal Medicine

## 2023-06-23 NOTE — Telephone Encounter (Signed)
 Called and spoke with patient. She states she believes this chest pain may be related to her stopping her meds but could just be heartburn as it has been 6 months since she stopped the medicine.  She also states that she doesn't really want to go to the ER or UC for evaluation. She would just like to keep her appointment with Dana Mcpherson in 2 days and be seen then.  I also asked her if she wanted me to send a message to the pharmacy team to see if she would qualify for some type of financial assistance with her meds and she states she would just prefer to wait and talk with Dana Mcpherson about that as well. Reviewed with her to go to the ER/UC if symptoms get worse. She voiced understanding.

## 2023-06-23 NOTE — Telephone Encounter (Signed)
  Pt c/o of Chest Pain: STAT if active CP, including tightness, pressure, jaw pain, radiating pain to shoulder/upper arm/back, CP unrelieved by Nitro. Symptoms reported of SOB, nausea, vomiting, sweating.  1. Are you having CP right now? No     2. Are you experiencing any other symptoms (ex. SOB, nausea, vomiting, sweating)? Feeling nauseous   3. Is your CP continuous or coming and going? continuous    4. Have you taken Nitroglycerin? No    5. How long have you been experiencing CP? 1 week     6. If NO CP at time of call then end call with telling Pt to call back or call 911 if Chest pain returns prior to return call from triage team.   The patient stated that she has been experiencing chest discomfort for the past week. She stopped taking her ivabradine 7.5 mg PO BID six months ago because her insurance informed her that it is on the Tier 3 medication list, and it would cost her $500 per month. She is unsure if the discomfort is related to acid reflux but has been taking Tums, Gas-X, and antacids, yet she continues to experience chest discomfort. She has made an appointment with Edd Fabian on 06/26/23 at 1:30 PM.

## 2023-06-23 NOTE — Progress Notes (Signed)
 Cardiology Clinic Note   Patient Name: Dana Mcpherson Date of Encounter: 06/26/2023  Primary Care Provider:  Royann Shivers, PA-C Primary Cardiologist:  Christell Constant, MD  Patient Profile    Dana Mcpherson 33 year old female presents to the clinic today for evaluation of her inappropriate sinus tachycardia.  Past Medical History    Past Medical History:  Diagnosis Date   Allergy    Anxiety    Asthma    Autoimmune disorder (HCC)    Complication of anesthesia    woke up during surgery & ithing after surgery   Family history of adverse reaction to anesthesia    "My mom has the same trouble with anesthesia".   GERD (gastroesophageal reflux disease)    Migraine headache    Nexplanon in place 10/05/2015   06/29/15 left arm   Polycystic ovarian syndrome    Pregnancy induced hypertension    Tachycardia    Urticaria    Vaginal Pap smear, abnormal    Past Surgical History:  Procedure Laterality Date   BIOPSY  06/19/2019   Procedure: BIOPSY;  Surgeon: Corbin Ade, MD;  [facility]: [facility];  Service: Endoscopy;;   CHOLECYSTECTOMY  2010   COLONOSCOPY WITH PROPOFOL Mcpherson/A 06/19/2019   Procedure: COLONOSCOPY WITH PROPOFOL;  Surgeon: Corbin Ade, MD; 1 tubular adenoma, otherwise normal-appearing colonic mucosa, normal TI.  Stool sample for C. difficile negative.  Recommended repeat colonoscopy in 7 years.   ESOPHAGOGASTRODUODENOSCOPY     ESOPHAGOGASTRODUODENOSCOPY (EGD) WITH PROPOFOL Mcpherson/A 04/08/2019   Procedure: ESOPHAGOGASTRODUODENOSCOPY (EGD) WITH PROPOFOL;  Surgeon: Corbin Ade, MD; erosive reflux esophagitis s/p dilation, gastric mucosa congested diffusely with snakeskin appearance, antral erosions, congested eroded duodenal bulb, otherwise normal.   LAPAROSCOPIC BILATERAL SALPINGECTOMY Bilateral 10/28/2020   Procedure: LAPAROSCOPIC BILATERAL SALPINGECTOMY;  Surgeon: Lazaro Arms, MD;  Location: [facility];  Service: Gynecology;  Laterality: Bilateral;    MALONEY DILATION Mcpherson/A 04/08/2019   Procedure: Elease Hashimoto DILATION;  Surgeon: Corbin Ade, MD;  Location: [facility];  Service: Endoscopy;  Laterality: Mcpherson/A;   POLYPECTOMY  06/19/2019   Procedure: POLYPECTOMY;  Surgeon: Corbin Ade, MD;  Location: [facility];  Service: Endoscopy;;  cecal polyp    Allergies  Allergies  Allergen Reactions   Other Anaphylaxis and Other (See Comments)    Pt states that she is allergic to Axe/Tag body spray and Lysol.    General anesthesia - pt woke up several times, was scratching during surgery. Itching   Allergy to sun and bug bites.   Shellfish Allergy Anaphylaxis and Hives   Adhesive [Tape] Hives   Egg-Derived Products Itching and Nausea Only   Latex Itching    History of Present Illness    Dana Mcpherson has a PMH of mild intermittent asthma, allergic rhinitis, hyperinsulinemia, GERD, PCOS, obesity, shortness of breath, positive ANA, upper back pain, recurrent infections, and inappropriate sinus tachycardia.  She was initially diagnosed with inappropriate sinus tachycardia in 2016 and was followed by Dr. Kirtland Bouchard.  She was placed on Corlanor at that time and did well.  She was seen in follow-up by Dr. Izora Ribas 10/22/2021.  She was noted to have inappropriate sinus tachycardia with pregnancy that did not resolve after pregnancy.  She had negative inflammatory workup and normal LV function with no evidence of ischemia.  She has a strong family history of inappropriate sinus tachycardia.  She was noted to have hypotension with beta-blocker therapy.  She did not try calcium channel  blocker.  During follow-up she denied chest pain or pressure.  She denied shortness of breath and DOE.  She denied weight gain and lower extremity swelling.  She denied palpitations unless she would forget to take her medication or be prescribed steroids.  She denied syncope.  She presents to the clinic today for follow-up evaluation and states she has had chest discomfort  for around 1 week.  Her EKG today shows sinus tachycardia 110 bpm.  She reports that she was not able to afford Corlanor.  She was unable to tolerate beta-blocker therapy due to hypotension.  Her chest discomfort is reproducible with deep palpation.  It is across the center of her chest.  She reports that her discomfort is worse with activity.  I reassured her that her discomfort is related to musculoskeletal type pain.  I recommended that she use cool compresses, rest the area, and to maintain her p.o. hydration.  I will order CBC, CMP and TSH.  We will plan follow-up in 18 to 24 months.  She lives in the [place] area.  I will have her follow-up with Roderic Scarce or [facility] providers.  I also recommended low intensity longer duration dysautonomia exercises.  Today she denies  shortness of breath, lower extremity edema, fatigue, palpitations, melena, hematuria, hemoptysis, diaphoresis, weakness, presyncope, syncope, orthopnea, and PND.    Home Medications    Prior to Admission medications   Medication Sig Start Date End Date Taking? Authorizing Provider  albuterol (VENTOLIN HFA) 108 (90 Base) MCG/ACT inhaler Inhale 1-2 puffs into the lungs every 6 (six) hours as needed. 04/22/22   Alfonse Spruce, MD  cetirizine (ZYRTEC) 10 MG tablet Take 2 tablets (20 mg total) by mouth 2 (two) times daily. 08/29/22   Birder Robson, MD  dupilumab (DUPIXENT) 300 MG/2ML prefilled syringe Inject 300 mg into the skin every 14 (fourteen) days. 10/26/22   Alfonse Spruce, MD  EPINEPHrine (EPIPEN 2-PAK) 0.3 mg/0.3 mL IJ SOAJ injection Inject 0.3 mg into the muscle once as needed for anaphylaxis. 08/29/22   Birder Robson, MD  esomeprazole (NEXIUM) 40 MG capsule Take 40mg  once to twice daily before meals. 07/15/22   Tiffany Kocher, PA-C  Galcanezumab-gnlm (EMGALITY) 120 MG/ML SOAJ Inject 1 Pen into the skin every 30 (thirty) days. 06/07/22   Ocie Doyne, MD  levonorgestrel-ethinyl estradiol (SEASONALE) 0.15-0.03 MG  tablet Take 1 tablet by mouth daily. 02/28/22   Lazaro Arms, MD  methocarbamol (ROBAXIN) 500 MG tablet Take 1 tablet (500 mg total) by mouth at bedtime as needed for muscle spasms. 01/17/23   Ocie Doyne, MD  ondansetron (ZOFRAN) 8 MG tablet Take 1 tablet (8 mg total) by mouth every 8 (eight) hours as needed for nausea or vomiting. 03/03/22   Ocie Doyne, MD  promethazine (PHENERGAN) 25 MG tablet Take 1/2 to 1 tablet every six hours as needed for nausea or vomiting. Use sparingly. 06/30/21   Tiffany Kocher, PA-C  sertraline (ZOLOFT) 50 MG tablet Take 50 mg by mouth daily. 09/30/22   [provider]  tacrolimus (PROTOPIC) 0.1 % ointment Apply topically 2 (two) times daily. 08/29/22   Birder Robson, MD  topiramate (TOPAMAX) 50 MG tablet Take 50 mg (1 pill) in the morning and 100 mg (2 pills) at bedtime 01/17/23   Ocie Doyne, MD  triamcinolone ointment (KENALOG) 0.1 % Apply twice daily for flare ups below neck, maximum 10 days. 08/29/22   Birder Robson, MD  Ubrogepant (UBRELVY) 100 MG TABS Take  1 tablet (100 mg total) by mouth as needed (for migraine). May repeat a dose in 2 hours if needed. Max dose 2 pills in 24 hours 01/17/23   Ocie Doyne, MD    Family History    Family History  Problem Relation Age of Onset   Cancer Paternal Grandmother        ovarian cancer   Other Mother        Ovarian Tumor   Hypertension Father    Asthma Son    ADD / ADHD Son    Asthma Son    Allergies Son    Colon cancer Other        late 52's   Diabetes Sister    Healthy Brother    She indicated that her mother is alive. She indicated that her father is alive. She indicated that her sister is alive. She indicated that her brother is alive. She indicated that her maternal grandmother is alive. She indicated that her maternal grandfather is alive. She indicated that her paternal grandmother is deceased. She indicated that both of her sons are alive. She indicated that the status of her other is  unknown.  Social History    Social History   Socioeconomic History   Marital status: Married    Spouse name: Not on file   Number of children: Not on file   Years of education: Not on file   Highest education level: Not on file  Occupational History   Not on file  Tobacco Use   Smoking status: Former    Current packs/day: 0.00    Average packs/day: 0.3 packs/day for 2.0 years (0.5 ttl pk-yrs)    Types: Cigarettes    Start date: 04/04/2008    Quit date: 04/04/2010    Years since quitting: 13.2   Smokeless tobacco: Never  Vaping Use   Vaping status: Never Used  Substance and Sexual Activity   Alcohol use: No   Drug use: No   Sexual activity: Yes    Birth control/protection: Pill, Surgical  Other Topics Concern   Not on file  Social History Narrative   Right handed    Caffeine- none   Social Drivers of Health   Financial Resource Strain: Low Risk  (02/28/2022)   Overall Financial Resource Strain (CARDIA)    Difficulty of Paying Living Expenses: Not hard at all  Food Insecurity: No Food Insecurity (02/28/2022)   Hunger Vital Sign    Worried About Running Out of Food in the Last Year: Never true    Ran Out of Food in the Last Year: Never true  Transportation Needs: No Transportation Needs (02/28/2022)   PRAPARE - Administrator, Civil Service (Medical): No    Lack of Transportation (Non-Medical): No  Physical Activity: Insufficiently Active (02/28/2022)   Exercise Vital Sign    Days of Exercise per Week: 5 days    Minutes of Exercise per Session: 20 min  Stress: Stress Concern Present (02/28/2022)   [employer] of Occupational Health - Occupational Stress Questionnaire    Feeling of Stress : Rather much  Social Connections: Unknown (02/28/2022)   Social Connection and Isolation Panel [NHANES]    Frequency of Communication with Friends and Family: More than three times a week    Frequency of Social Gatherings with Friends and Family: Patient declined     Attends Religious Services: Patient declined    Database administrator or Organizations: Patient declined    Attends Banker  Meetings: Patient declined    Marital Status: Married  Catering manager Violence: Not At Risk (02/28/2022)   Humiliation, Afraid, Rape, and Kick questionnaire    Fear of Current or Ex-Partner: No    Emotionally Abused: No    Physically Abused: No    Sexually Abused: No     Review of Systems    General:  No chills, fever, night sweats or weight changes.  Cardiovascular:  No chest pain, dyspnea on exertion, edema, orthopnea, palpitations, paroxysmal nocturnal dyspnea. Dermatological: No rash, lesions/masses Respiratory: No cough, dyspnea Urologic: No hematuria, dysuria Abdominal:   No nausea, vomiting, diarrhea, bright red blood per rectum, melena, or hematemesis Neurologic:  No visual changes, wkns, changes in mental status. All other systems reviewed and are otherwise negative except as noted above.  Physical Exam    VS:  BP 130/86 (BP Location: Right Arm, Patient Position: Sitting, Cuff Size: Large)   Pulse (!) 110   Ht 5\' 6"  (1.676 m)   Wt 258 lb 12.8 oz (117.4 kg)   SpO2 98%   BMI 41.77 kg/m  , BMI Body mass index is 41.77 kg/m. GEN: Well nourished, well developed, in no acute distress. HEENT: normal. Neck: Supple, no JVD, carotid bruits, or masses. Cardiac: RRR, no murmurs, rubs, or gallops. No clubbing, cyanosis, edema.  Radials/DP/PT 2+ and equal bilaterally.  Respiratory:  Respirations regular and unlabored, clear to auscultation bilaterally. GI: Soft, nontender, nondistended, BS + x 4. MS: no deformity or atrophy.  Reproducible chest discomfort with deep palpation. Skin: warm and dry, no rash. Neuro:  Strength and sensation are intact. Psych: Normal affect.  Accessory Clinical Findings    Recent Labs: No results found for requested labs within last 365 days.   Recent Lipid Panel No results found for: "CHOL", "TRIG", "HDL",  "CHOLHDL", "VLDL", "LDLCALC", "LDLDIRECT"       ECG personally reviewed by me today- EKG Interpretation Date/Time:  Monday June 26 2023 13:55:09 EST Ventricular Rate:  110 PR Interval:  166 QRS Duration:  84 QT Interval:  348 QTC Calculation: 470 R Axis:   27  Text Interpretation: Sinus tachycardia Possible Left atrial enlargement Low voltage QRS Nonspecific T wave abnormality When compared with ECG of 14-Sep-2021 17:15, PREVIOUS ECG IS PRESENT Confirmed by Edd Fabian [phone]) on 06/26/2023 1:57:37 PM   Echocardiogram 09/08/2016  Transthoracic Echocardiography   Patient:    Dana Mcpherson, Dana Mcpherson  MR #:       [id number]  Study Date: 09/08/2016  Gender:     F  Age:        33  Height:     167.6 cm  Weight:     102.5 kg  BSA:        2.23 m^2  Pt. Status:  Room:    SONOGRAPHER  [facility]   ATTENDING    Prentice Docker, MD   ORDERING     Prentice Docker, MD   REFERRING    Prentice Docker, MD   PERFORMING   [facility], [place]   cc:   -------------------------------------------------------------------  LV EF: 60% -   65%   -------------------------------------------------------------------  History:  PMH:   Palpitations, tachycardia, fatigue, generalized  edema, and exertional dyspnea.  Risk factors:  Former tobacco use.  Hypertension. Obese.   -------------------------------------------------------------------  Study Conclusions   - Left ventricle: The cavity size was normal. Wall thickness was    normal. Systolic function was normal. The estimated ejection    fraction was in the range of 60% to  65%. Images were inadequate    for LV wall motion assessment. No gross regional variation on    available images. Left ventricular diastolic function parameters    were normal.   -------------------------------------------------------------------  Labs, prior tests, procedures, and surgery:  Echocardiography (June 2016).     EF was 60%.    -------------------------------------------------------------------  Study data:  Comparison was made to the study of June 2016.  Study  status:  Routine.  Procedure:  Transthoracic echocardiography.  Image quality was adequate.          Transthoracic  echocardiography.  M-mode, complete 2D, spectral Doppler, and color  Doppler.  Birthdate:  Patient birthdate: Jun 20, 1990.  Age:  Patient  is 33 yr old.  Sex:  Gender: female.    BMI: 36.5 kg/m^2.  Blood  pressure:     118/74  Patient status:  Outpatient.  Study date:  Study date: 09/08/2016. Study time: 11:51 AM.   -------------------------------------------------------------------   -------------------------------------------------------------------  Left ventricle:  The cavity size was normal. Wall thickness was  normal. Systolic function was normal. The estimated ejection  fraction was in the range of 60% to 65%. Images were inadequate for  LV wall motion assessment. No gross regional variation on available  images. The transmitral flow pattern was normal. The deceleration  time of the early transmitral flow velocity was normal. The  pulmonary vein flow pattern was normal. The tissue Doppler  parameters were normal. Left ventricular diastolic function  parameters were normal.   -------------------------------------------------------------------  Aortic valve:   Probably trileaflet.  Doppler:   There was no  stenosis.   There was no regurgitation.    Peak velocity ratio of  LVOT to aortic valve: 0.58. Valve area (Vmax): 1.48 cm^2. Indexed  valve area (Vmax): 0.66 cm^2/m^2.    Peak gradient (S): 8 mm Hg.    -------------------------------------------------------------------  Aorta: Aortic root: The aortic root was normal in size.   -------------------------------------------------------------------  Mitral valve:   Structurally normal valve.   Leaflet separation was  normal.  Doppler:  Transvalvular velocity was within the normal   range. There was no evidence for stenosis. There was trivial  regurgitation.    Peak gradient (D): 5 mm Hg.   -------------------------------------------------------------------  Left atrium:  The atrium was normal in size.   -------------------------------------------------------------------  Atrial septum:  No defect or patent foramen ovale was identified.    -------------------------------------------------------------------  Right ventricle:  Poorly visualized. The cavity size was normal.  Wall thickness was normal. Systolic function was normal.   -------------------------------------------------------------------  Pulmonic valve:   Poorly visualized.  Doppler:  There was no  significant regurgitation.   -------------------------------------------------------------------  Tricuspid valve:   Structurally normal valve.   Leaflet separation  was normal.  Doppler:  Transvalvular velocity was within the normal  range. There was trivial regurgitation.   -------------------------------------------------------------------  Right atrium:  The atrium was normal in size.   -------------------------------------------------------------------  Pericardium: Not well visualized.   -------------------------------------------------------------------  Systemic veins:  Inferior vena cava: The vessel was normal in size. The  respirophasic diameter changes were in the normal range (>= 50%),  consistent with normal central venous pressure.       Assessment & Plan   1.  Inappropriate sinus tachycardia-EKG today shows sinus tachycardia 110 bpm. Denies recent episodes of irregular or accelerated heart rate.  Previously did not tolerate AV nodal blocking agents due to hypotension. Heart healthy low-sodium diet Increase physical activity as tolerated-reviewed importance of maintaining dysautonomia exercises and overall physical fitness.  (  Recumbent bike, swimming, rowing. Lower extremity  support stockings Heart healthy low-sodium diet CBC, CMP, TSH  Obesity-weight today 258.8. Reduced calorie diet, increase fiber Continue weight loss  PCOS-continue current medical therapy Follows with PCP/GYN  Disposition: Follow-up with Dr. Izora Ribas or me in 18-24 months.   Thomasene Ripple. Bristol Osentoski NP-C     06/26/2023, 1:58 PM [facility] [address] Office [phone] Fax [phone]    I spent 14 minutes examining this patient, reviewing medications, and using patient centered shared decision making involving their cardiac care.   I spent  20 minutes reviewing past medical history,  medications, and prior cardiac tests.

## 2023-06-26 ENCOUNTER — Encounter: Payer: Self-pay | Admitting: General Practice

## 2023-06-26 ENCOUNTER — Ambulatory Visit: Payer: 59 | Attending: General Practice | Admitting: General Practice

## 2023-06-26 VITALS — BP 130/86 | HR 110 | Ht 66.0 in | Wt 258.8 lb

## 2023-06-26 DIAGNOSIS — I4711 Inappropriate sinus tachycardia, so stated: Secondary | ICD-10-CM | POA: Diagnosis not present

## 2023-06-26 DIAGNOSIS — R079 Chest pain, unspecified: Secondary | ICD-10-CM

## 2023-06-26 DIAGNOSIS — E282 Polycystic ovarian syndrome: Secondary | ICD-10-CM | POA: Diagnosis not present

## 2023-06-26 NOTE — Patient Instructions (Addendum)
 Medication Instructions:  The current medical regimen is effective;  continue present plan and medications as directed. Please refer to the Current Medication list given to you today.  *If you need a refill on your cardiac medications before your next appointment, please call your pharmacy*  Lab Work: CBC, CMET AND TSH TODAY If you have labs (blood work) drawn today and your tests are completely normal, you will receive your results only by:  MyChart Message (if you have MyChart) OR  A paper copy in the mail If you have any lab test that is abnormal or we need to change your treatment, we will call you to review the results.  Other Instructions AVOID CAFFEINE MAINTAIN HYDRATION INCREASE PHYSICAL ACTIVITY LOW IMPACT EXERCISE:  SWIMMING, RECUMBENT BIKE, ROWING, ETC...  Follow-Up: At [facility], you and your health needs are our priority.  As part of our continuing mission to provide you with exceptional heart care, we have created designated Provider Care Teams.  These Care Teams include your primary Cardiologist (physician) and Advanced Practice Providers (APPs -  Physician Assistants and Nurse Practitioners) who all work together to provide you with the care you need, when you need it.  Your next appointment:   18-24 month(s)  Provider:   Sharlene Dory, NP

## 2023-07-01 LAB — COMPREHENSIVE METABOLIC PANEL
ALT: 21 IU/L (ref 0–32)
AST: 18 IU/L (ref 0–40)
Albumin: 4.5 g/dL (ref 3.9–4.9)
Alkaline Phosphatase: 134 IU/L — ABNORMAL HIGH (ref 44–121)
BUN/Creatinine Ratio: 10 (ref 9–23)
BUN: 7 mg/dL (ref 6–20)
Bilirubin Total: 0.3 mg/dL (ref 0.0–1.2)
CO2: 20 mmol/L (ref 20–29)
Calcium: 9.4 mg/dL (ref 8.7–10.2)
Chloride: 102 mmol/L (ref 96–106)
Creatinine, Ser: 0.72 mg/dL (ref 0.57–1.00)
Globulin, Total: 3 g/dL (ref 1.5–4.5)
Glucose: 100 mg/dL — ABNORMAL HIGH (ref 70–99)
Potassium: 4.3 mmol/L (ref 3.5–5.2)
Sodium: 139 mmol/L (ref 134–144)
Total Protein: 7.5 g/dL (ref 6.0–8.5)
eGFR: 112 mL/min/{1.73_m2} (ref >59)

## 2023-07-01 LAB — CBC
Hematocrit: 40.9 % (ref 34.0–46.6)
Hemoglobin: 13.9 g/dL (ref 11.1–15.9)
MCH: 29 pg (ref 26.6–33.0)
MCHC: 34 g/dL (ref 31.5–35.7)
MCV: 85 fL (ref 79–97)
Platelets: 353 10*3/uL (ref 150–450)
RBC: 4.8 x10E6/uL (ref 3.77–5.28)
RDW: 13 % (ref 11.7–15.4)
WBC: 11.2 10*3/uL — ABNORMAL HIGH (ref 3.4–10.8)

## 2023-07-01 LAB — TSH

## 2023-08-30 ENCOUNTER — Ambulatory Visit: Payer: 59 | Admitting: Allergy & Immunology

## 2023-09-29 ENCOUNTER — Ambulatory Visit: Admitting: Family Medicine

## 2023-09-29 ENCOUNTER — Encounter: Payer: Self-pay | Admitting: Family Medicine

## 2023-09-29 ENCOUNTER — Other Ambulatory Visit: Payer: Self-pay

## 2023-09-29 VITALS — BP 110/80 | HR 103 | Temp 98.4°F | Wt 257.0 lb

## 2023-09-29 DIAGNOSIS — L209 Atopic dermatitis, unspecified: Secondary | ICD-10-CM

## 2023-09-29 DIAGNOSIS — L501 Idiopathic urticaria: Secondary | ICD-10-CM

## 2023-09-29 DIAGNOSIS — J3089 Other allergic rhinitis: Secondary | ICD-10-CM

## 2023-09-29 DIAGNOSIS — J4520 Mild intermittent asthma, uncomplicated: Secondary | ICD-10-CM | POA: Diagnosis not present

## 2023-09-29 DIAGNOSIS — T7800XD Anaphylactic reaction due to unspecified food, subsequent encounter: Secondary | ICD-10-CM

## 2023-09-29 DIAGNOSIS — B999 Unspecified infectious disease: Secondary | ICD-10-CM

## 2023-09-29 MED ORDER — FAMOTIDINE 20 MG PO TABS
20.0000 mg | ORAL_TABLET | Freq: Two times a day (BID) | ORAL | 5 refills | Status: AC | PRN
Start: 2023-09-29 — End: ?

## 2023-09-29 MED ORDER — CETIRIZINE HCL 10 MG PO TABS: 10.0000 mg | ORAL_TABLET | Freq: Two times a day (BID) | ORAL | 5 refills | Status: AC | PRN

## 2023-09-29 NOTE — Patient Instructions (Addendum)
 Asthma Continue to use Xopenex  or albuterol  2 puffs once every 4 hours if needed for cough or wheeze  Allergic rhinitis Continue allergen avoidance measures directed toward dust mite and cockroach as listed below Continue cetirizine  10 mg once a day if needed for runny nose or itch Consider saline nasal rinses as needed for nasal symptoms. Use this before any medicated nasal sprays for best result Consider allergen immunotherapy if your symptoms are not well-controlled with the treatment plan as listed above  Hives (urticaria) Take the least amount of medications while remaining hive free Cetirizine  (Zyrtec ) 10mg  twice a day and famotidine (Pepcid) 20 mg twice a day. If no symptoms for 7-14 days then decrease to. Cetirizine  (Zyrtec ) 10mg  twice a day and famotidine (Pepcid) 20 mg once a day.  If no symptoms for 7-14 days then decrease to. Cetirizine  (Zyrtec ) 10mg  twice a day.  If no symptoms for 7-14 days then decrease to. Cetirizine  (Zyrtec ) 10mg  once a day.  May use Benadryl  (diphenhydramine ) as needed for breakthrough hives       If symptoms return, then step up dosage  Keep a detailed symptom journal including foods eaten, contact with allergens, medications taken, weather changes.  Continue Dupixent  for control of urticaria  Atopic dermatitis Continue twice a day moisturizing routine Consider patch testing to rule out contact dermatitis  Recurrent infection Continue to monitor for infection, antibiotic use, and steroid use  Food allergy Continue to avoid fish, shellfish, and stovetop egg. In case of an allergic reaction take Benadryl  50 mg every 4 hours or cetirizine  10 mg once every 24 hours, and if life-threatening symptoms occur, inject with EpiPen  0.3 mg.  Call the clinic if this treatment plan is not working well for you.  Follow up in 6 months or sooner if needed.   Control of Dust Mite Allergen Dust mites play a major role in allergic asthma and rhinitis. They occur  in environments with high humidity wherever human skin is found. Dust mites absorb humidity from the atmosphere (ie, they do not drink) and feed on organic matter (including shed human and animal skin). Dust mites are a microscopic type of insect that you cannot see with the naked eye. High levels of dust mites have been detected from mattresses, pillows, carpets, upholstered furniture, bed covers, clothes, soft toys and any woven material. The principal allergen of the dust mite is found in its feces. A gram of dust may contain 1,000 mites and 250,000 fecal particles. Mite antigen is easily measured in the air during house cleaning activities. Dust mites do not bite and do not cause harm to humans, other than by triggering allergies/asthma.  Ways to decrease your exposure to dust mites in your home:  1. Encase mattresses, box springs and pillows with a mite-impermeable barrier or cover  2. Wash sheets, blankets and drapes weekly in hot water  (130 F) with detergent and dry them in a dryer on the hot setting.  3. Have the room cleaned frequently with a vacuum cleaner and a damp dust-mop. For carpeting or rugs, vacuuming with a vacuum cleaner equipped with a high-efficiency particulate air (HEPA) filter. The dust mite allergic individual should not be in a room which is being cleaned and should wait 1 hour after cleaning before going into the room.  4. Do not sleep on upholstered furniture (eg, couches).  5. If possible removing carpeting, upholstered furniture and drapery from the home is ideal. Horizontal blinds should be eliminated in the rooms where the person  spends the most time (bedroom, study, television room). Washable vinyl, roller-type shades are optimal.  6. Remove all non-washable stuffed toys from the bedroom. Wash stuffed toys weekly like sheets and blankets above.  7. Reduce indoor humidity to less than 50%. Inexpensive humidity monitors can be purchased at most hardware stores. Do not  use a humidifier as can make the problem worse and are not recommended.  Control of Cockroach Allergen Cockroach allergen has been identified as an important cause of acute attacks of asthma, especially in urban settings.  There are fifty-five species of cockroach that exist in the [place] , however only three, the [place], [place] and [place] species produce allergen that can affect patients with Asthma.  Allergens can be obtained from fecal particles, egg casings and secretions from cockroaches.    Remove food sources. Reduce access to water . Seal access and entry points. Spray runways with 0.5-1% Diazinon or Chlorpyrifos Blow boric acid power under stoves and refrigerator. Place bait stations (hydramethylnon) at feeding sites.

## 2023-09-29 NOTE — Progress Notes (Addendum)
 [address] [place] [address] Dept: [phone]  FOLLOW UP NOTE  Patient ID: Dana Mcpherson, female    DOB: 12-19-1990  Age: 33 y.o. MRN: [id number] Date of Office Visit: 09/29/2023  Assessment  Chief Complaint: Follow-up (Asthma /Allergies/No concerns)  HPI Dana Mcpherson is a 33 year old female who presents to the clinic for a follow-up visit.  She was last seen in this clinic on 10/21/2022 by Dr. Idolina Maker for evaluation of asthma, allergic rhinitis, urticaria, atopic dermatitis, recurrent infection, and food allergy to fish, shellfish, and egg.  Her problem list includes joint pain for which she follows up with Dr. Rodell Citrin as well as tachycardia.  At today's visit, she reports her asthma has been well-controlled with no symptoms including shortness of breath, cough, or wheeze with activity or rest.  She reports that she rarely uses albuterol  for relief of symptoms.  She reports that she mostly uses albuterol  during illness.  She reports some shaky side effects from albuterol .  She is not interested in having Xopenex  at this time.  She reports that she will call the clinic if she wants to begin Xopenex  instead of albuterol  due to side effects.  Allergic rhinitis is reported as well-controlled at this time.  She denies symptoms including rhinorrhea, nasal congestion, sneezing, and postnasal drainage.  She does report that spring is the time she experiences the most symptoms.  She reports that she frequently gets cold symptoms in the colder months.  She is not currently using cetirizine , Flonase , or nasal saline rinses. Her last environmental allergy skin testing was on 06/10/2020 and was positive to dust mite and cockroach.    Urticaria is reported as moderately well-controlled with infrequent hive breakouts.  She reports these breakouts are mostly due to eating foods such as stovetop egg.  She continues Benadryl  as needed for breakouts.  She denies cardiopulmonary or  gastrointestinal symptoms with these breakouts.  She is not currently using Xolair  or Dupixent  for hive control. Atopic dermatitis is reported as well-controlled with only infrequent red and itchy areas.  She continues a twice a day moisturizing routine and is not currently using any topical medicated treatments.  She denies infections, antibiotics, or steroids since her last visit to this clinic.  She continues to avoid fish, shellfish, and stovetop egg.  She reports that she frequently eats egg baked into product with no adverse reaction.  She does report that she occasionally eats stovetop egg which mainly causes hives.  Her last food allergy skin testing was on 06/10/2020 and was negative to the panel.  Her last food allergy test via lab on 07/09/2020 was slightly positive to crab and shrimp.  Her current medications are listed in the chart.  Drug Allergies:  Allergies  Allergen Reactions   Other Anaphylaxis and Other (See Comments)    Pt states that she is allergic to Axe/Tag body spray and Lysol.    General anesthesia - pt woke up several times, was scratching during surgery. Itching   Allergy to sun and bug bites.   Shellfish Allergy Anaphylaxis and Hives   Adhesive [Tape] Hives   Egg-Derived Products Itching and Nausea Only   Latex Itching    Physical Exam: BP 110/80   Pulse (!) 103   Temp 98.4 F (36.9 C)   Wt 257 lb (116.6 kg)   SpO2 98%   BMI 41.48 kg/m    Physical Exam Vitals reviewed.  Constitutional:      Appearance: Normal appearance.  HENT:     Head: Normocephalic and atraumatic.     Right Ear: Tympanic membrane normal.     Left Ear: Tympanic membrane normal.     Nose:     Comments: Bilateral nares slightly erythematous with thin clear nasal drainage noted.  Pharynx normal.  Ears normal.  Eyes normal.    Mouth/Throat:     Pharynx: Oropharynx is clear.  Eyes:     Conjunctiva/sclera: Conjunctivae normal.  Cardiovascular:     Rate and Rhythm: Normal rate  and regular rhythm.     Heart sounds: Normal heart sounds. No murmur heard. Pulmonary:     Effort: Pulmonary effort is normal.     Breath sounds: Normal breath sounds.     Comments: Lungs clear to auscultation Musculoskeletal:        General: Normal range of motion.     Cervical back: Normal range of motion and neck supple.  Skin:    General: Skin is warm and Mcpherson.  Neurological:     Mental Status: She is alert and oriented to person, place, and time.  Psychiatric:        Mood and Affect: Mood normal.        Behavior: Behavior normal.        Thought Content: Thought content normal.        Judgment: Judgment normal.     Diagnostics: FVC 2.98 which is 71% of predicted value, FEV1 2.39 which is 69% of predicted value.  Spirometry indicates possible restriction.  This is consistent with previous spirometry readings  Assessment and Plan: 1. Mild intermittent asthma without complication   2. Perennial allergic rhinitis   3. Idiopathic urticaria   4. Atopic dermatitis, unspecified type   5. Recurrent infections   6. Anaphylactic shock due to food, subsequent encounter     Meds ordered this encounter  Medications   cetirizine  (ZYRTEC ) 10 MG tablet    Sig: Take 1 tablet (10 mg total) by mouth 2 (two) times daily as needed (hives or allergies).    Dispense:  60 tablet    Refill:  5   famotidine (PEPCID) 20 MG tablet    Sig: Take 1 tablet (20 mg total) by mouth 2 (two) times daily as needed (hives).    Dispense:  60 tablet    Refill:  5    Patient Instructions  Asthma Continue to use Xopenex  or albuterol  2 puffs once every 4 hours if needed for cough or wheeze  Allergic rhinitis Continue allergen avoidance measures directed toward dust mite and cockroach as listed below Continue cetirizine  10 mg once a day if needed for runny nose or itch Consider saline nasal rinses as needed for nasal symptoms. Use this before any medicated nasal sprays for best result Consider allergen  immunotherapy if your symptoms are not well-controlled with the treatment plan as listed above  Hives (urticaria) Take the least amount of medications while remaining hive free Cetirizine  (Zyrtec ) 10mg  twice a day and famotidine (Pepcid) 20 mg twice a day. If no symptoms for 7-14 days then decrease to. Cetirizine  (Zyrtec ) 10mg  twice a day and famotidine (Pepcid) 20 mg once a day.  If no symptoms for 7-14 days then decrease to. Cetirizine  (Zyrtec ) 10mg  twice a day.  If no symptoms for 7-14 days then decrease to. Cetirizine  (Zyrtec ) 10mg  once a day.  May use Benadryl  (diphenhydramine ) as needed for breakthrough hives       If symptoms return, then step up dosage  Keep a detailed  symptom journal including foods eaten, contact with allergens, medications taken, weather changes.  Continue Dupixent  for control of urticaria  Atopic dermatitis Continue twice a day moisturizing routine Consider patch testing to rule out contact dermatitis  Recurrent infection Continue to monitor for infection, antibiotic use, and steroid use  Food allergy Continue to avoid fish, shellfish, and stovetop egg. In case of an allergic reaction take Benadryl  50 mg every 4 hours or cetirizine  10 mg once every 24 hours, and if life-threatening symptoms occur, inject with EpiPen  0.3 mg.  Call the clinic if this treatment plan is not working well for you.  Follow up in 6 months or sooner if needed.   Return in about 6 months (around 03/30/2024), or if symptoms worsen or fail to improve.    Thank you for the opportunity to care for this patient.  Please do not hesitate to contact me with questions.  Marinus Sic, FNP [facility] 

## 2023-12-19 ENCOUNTER — Ambulatory Visit: Admitting: Obstetrics and Gynecology

## 2024-01-24 ENCOUNTER — Encounter: Payer: Self-pay | Admitting: Advanced Practice Midwife

## 2024-01-24 ENCOUNTER — Ambulatory Visit: Admitting: Advanced Practice Midwife

## 2024-01-24 VITALS — BP 119/82 | HR 87 | Ht 66.2 in | Wt 250.0 lb

## 2024-01-24 DIAGNOSIS — Z1331 Encounter for screening for depression: Secondary | ICD-10-CM

## 2024-01-24 DIAGNOSIS — Z01419 Encounter for gynecological examination (general) (routine) without abnormal findings: Secondary | ICD-10-CM

## 2024-01-24 DIAGNOSIS — Z1151 Encounter for screening for human papillomavirus (HPV): Secondary | ICD-10-CM | POA: Diagnosis not present

## 2024-01-24 NOTE — Progress Notes (Signed)
 WELL-WOMAN EXAMINATION Patient name: Dana Mcpherson MRN [id number]  Date of birth: 1990-10-18 Chief Complaint:   Gynecologic Exam (Last pap 02-28-2022 +hpv)  History of Present Illness:   Dana Mcpherson is a 33 y.o. G73P2012 Caucasian female being seen today for a routine well-woman exam.  Current complaints: has hx of irreg cycles; s/p BTL 2022; more recently has been having discomfort/pelvic heaviness/cramping with cycles; isn't interested in using anything hormonal- tried Seasonale but didn't like the side effects (weight gain; difficulty losing weight)  PCP: Comer Raymond PA      does not desire labs Patient's last menstrual period was 12/23/2023. The current method of family planning is tubal ligation.  Last pap Nov 2023. Results were: NILM w/ HRHPV positive: other (not 16, 18/45). H/O abnormal pap: yes Last mammogram: never. Results were: N/A. Family h/o breast cancer: no Last colonoscopy: 2021. Results were: normal. Family h/o colorectal cancer: no     01/24/2024    8:41 AM 02/28/2022    2:45 PM 08/03/2020    1:43 PM 07/11/2019    1:54 PM 05/03/2017   10:06 AM  Depression screen PHQ 2/9  Decreased Interest 0 0 0 0 0  Down, Depressed, Hopeless 0 0 0 1 1  PHQ - 2 Score 0 0 0 1 1  Altered sleeping 2 2 2     Tired, decreased energy 2 1 1     Change in appetite 2 1 0    Feeling bad or failure about yourself  0 0 0    Trouble concentrating 0 1 0    Moving slowly or fidgety/restless 0 0 0    Suicidal thoughts 0 0 0    PHQ-9 Score 6 5 3           01/24/2024    8:42 AM 02/28/2022    2:45 PM 08/03/2020    1:43 PM  GAD 7 : Generalized Anxiety Score  Nervous, Anxious, on Edge 2 1 1   Control/stop worrying 2 1 1   Worry too much - different things 2 1 1   Trouble relaxing 2 1 1   Restless 0 0 0  Easily annoyed or irritable 0 0 0  Afraid - awful might happen 0 0 0  Total GAD 7 Score 8 4 4      Review of Systems:   Pertinent items are noted in HPI Denies any headaches, blurred  vision, fatigue, shortness of breath, chest pain, abdominal pain, abnormal vaginal discharge/itching/odor/irritation, problems with periods, bowel movements, urination, or intercourse unless otherwise stated above. Pertinent History Reviewed:  Reviewed past medical,surgical, social and family history.  Reviewed problem list, medications and allergies. Physical Assessment:   Vitals:   01/24/24 0835  BP: 119/82  Pulse: 87  Weight: 250 lb (113.4 kg)  Height: 5' 6.2 (1.681 m)  Body mass index is 40.11 kg/m.        Physical Examination:   General appearance - well appearing, and in no distress  Mental status - alert, oriented to person, place, and time  Psych:  She has a normal mood and affect  Skin - warm and dry, normal color, no suspicious lesions noted  Chest - effort normal, all lung fields clear to auscultation bilaterally  Heart - normal rate and regular rhythm  Neck:  midline trachea, no thyromegaly or nodules  Breasts - breasts appear normal, no suspicious masses, no skin or nipple changes or  axillary nodes  Abdomen - soft, nontender, nondistended, no masses or organomegaly  Pelvic - VULVA: normal  appearing vulva with no masses, tenderness or lesions  VAGINA: normal appearing vagina with normal color and discharge, no lesions  CERVIX: normal appearing cervix without discharge or lesions, no CMT  Thin prep pap is done with HR HPV cotesting  UTERUS: uterus is felt to be normal size, shape, consistency and nontender   ADNEXA: No adnexal masses or tenderness noted.  Rectal - not examined  Extremities:  No swelling or varicosities noted  Chaperone: Peggy Dones  No results found for this or any previous visit (from the past 24 hours).  Assessment & Plan:  1) Well-Woman Exam  2) Hx abnormal Pap (Nov 2023 NILM, +HRHPV), for repeat today  3) Dysmenorrhea, pelvic pressure/cramping/nausea; doesn't want to try anything hormonal; comfort measures as well as ibuprofen  around the clock  during cycle recommended  Labs/procedures today: Pap  Mammogram: @ 35yo, or sooner if problems Colonoscopy: per GI (2028 due to benign polyps in 2021), or sooner if problems  No orders of the defined types were placed in this encounter.   Meds: No orders of the defined types were placed in this encounter.   Follow-up: Return in about 1 year (around 01/23/2025) for Physical.  Suzen JONETTA Gentry [facility] 01/24/2024 4:33 PM

## 2024-01-26 LAB — CYTOLOGY - PAP
Comment: NEGATIVE
Comment: NEGATIVE
Comment: NEGATIVE
Diagnosis: NEGATIVE
HPV 16: NEGATIVE
HPV 18 / 45: POSITIVE — AB
High risk HPV: POSITIVE — AB

## 2024-01-28 ENCOUNTER — Ambulatory Visit: Payer: Self-pay | Admitting: Advanced Practice Midwife

## 2024-01-29 ENCOUNTER — Telehealth: Payer: Self-pay

## 2024-01-29 NOTE — Telephone Encounter (Signed)
 Left message for patient to call the office to get colpo scheduled per Luke Gentry.

## 2024-02-06 ENCOUNTER — Ambulatory Visit (INDEPENDENT_AMBULATORY_CARE_PROVIDER_SITE_OTHER): Admitting: Women's Health

## 2024-02-06 ENCOUNTER — Encounter: Payer: Self-pay | Admitting: Women's Health

## 2024-02-06 ENCOUNTER — Other Ambulatory Visit (HOSPITAL_COMMUNITY)
Admission: RE | Admit: 2024-02-06 | Discharge: 2024-02-06 | Disposition: A | Discharge status: Home / Self Care | Source: Ambulatory Visit | Attending: Women's Health | Admitting: Women's Health

## 2024-02-06 VITALS — BP 121/84 | HR 64 | Ht 66.2 in | Wt 251.0 lb

## 2024-02-06 DIAGNOSIS — R87810 Cervical high risk human papillomavirus (HPV) DNA test positive: Secondary | ICD-10-CM

## 2024-02-06 DIAGNOSIS — Z3202 Encounter for pregnancy test, result negative: Secondary | ICD-10-CM | POA: Diagnosis not present

## 2024-02-06 DIAGNOSIS — R87619 Unspecified abnormal cytological findings in specimens from cervix uteri: Secondary | ICD-10-CM | POA: Insufficient documentation

## 2024-02-06 LAB — POCT URINE PREGNANCY: Preg Test, Ur: NEGATIVE

## 2024-02-06 NOTE — Addendum Note (Signed)
 Addended by: SANNA GONG A on: 02/06/2024 12:27 PM   Modules accepted: Orders

## 2024-02-06 NOTE — Patient Instructions (Signed)
 Colposcopy, Care After  The following information offers guidance on how to care for yourself after your procedure. Your health care provider may also give you more specific instructions. If you have problems or questions, contact your health care provider. What can I expect after the procedure? If you had a colposcopy without a biopsy, you can expect to feel fine right away after your procedure. However, you may have some spotting of blood for a few days. You can return to your normal activities. If you had a colposcopy with a biopsy, it is common after the procedure to have: Soreness and mild pain. These may last for a few days. Mild vaginal bleeding or discharge that is dark-colored and grainy. This may last for a few days. The discharge may be caused by a liquid (solution) that was used during the procedure. You may need to wear a sanitary pad during this time. Spotting of blood for at least 48 hours after the procedure. Follow these instructions at home: Medicines Take over-the-counter and prescription medicines only as told by your health care provider. Talk with your health care provider about what type of over-the-counter pain medicines and prescription medicines you can start to take again. It is especially important to talk with your health care provider if you take blood thinners. Activity Avoid using douche products, using tampons, and having sex for at least 3 days after the procedure or for as long as told by your health care provider. Return to your normal activities as told by your health care provider. Ask your health care provider what activities are safe for you. General instructions Ask your health care provider if you may take baths, swim, or use a hot tub. You may take showers. If you use birth control (contraception), continue to use it. Keep all follow-up visits. This is important. Contact a health care provider if: You have a fever or chills. You faint or feel  light-headed. Get help right away if: You have heavy bleeding from your vagina or pass blood clots. Heavy bleeding is bleeding that soaks through a sanitary pad in less than 1 hour. You have vaginal discharge that is abnormal, is yellow in color, or smells bad. This could be a sign of infection. You have severe pain or cramps in your lower abdomen that do not go away with medicine. Summary If you had a colposcopy without a biopsy, you can expect to feel fine right away, but you may have some spotting of blood for a few days. You can return to your normal activities. If you had a colposcopy with a biopsy, it is common to have mild pain for a few days and spotting for 48 hours after the procedure. Avoid using douche products, using tampons, and having sex for at least 3 days after the procedure or for as long as told by your health care provider. Get help right away if you have heavy bleeding, severe pain, or signs of infection. This information is not intended to replace advice given to you by your health care provider. Make sure you discuss any questions you have with your health care provider. Document Revised: 09/06/2020 Document Reviewed: 09/06/2020 Elsevier Patient Education  2024 [employer].

## 2024-02-06 NOTE — Progress Notes (Signed)
   COLPOSCOPY PROCEDURE NOTE Patient name: Dana Mcpherson MRN [id number]  Date of birth: 1990-07-02 Subjective Findings:   Dana N Minnifield is a 33 y.o. G35P2012 Caucasian female being seen today for a colposcopy. Indication: Abnormal pap on 01/24/24: NILM w/ HRHPV positive: 18/45  Prior cytology:  Date Result Procedure  2023 NILM w/ HRHPV positive: other (not 16, 18/45) None  2021 NILM w/ HRHPV negative None  2019 NILM w/ HRHPV not done None  2017 NILM w/ HRHPV not done None  Patient's last menstrual period was 01/27/2024. Contraception: tubal ligation. Menopausal: no. Hysterectomy: no.   Considering pregnancy: No New sex partner: no Smoker: no. Immunocompromised: no.   The risks and benefits were explained and informed consent was obtained, and written copy is in chart. Pertinent History Reviewed:   Reviewed past medical,surgical, social, obstetrical and family history.  Reviewed problem list, medications and allergies. Objective Findings & Procedure:   Vitals:   02/06/24 1135  BP: 121/84  Pulse: 64  Weight: 251 lb (113.9 kg)  Height: 5' 6.2 (1.681 m)  Body mass index is 40.27 kg/m.  Results for orders placed or performed in visit on 02/06/24 (from the past 24 hours)  POCT urine pregnancy   Collection Time: 02/06/24 11:52 AM  Result Value Ref Range   Preg Test, Ur Negative Negative     Time out was performed.  Speculum placed in the vagina, cervix fully visualized. SCJ: fully visualized. Cervix swabbed x 3 with acetic acid.  Acetowhitening present: Yes Cervix: no visible lesions, no punctation, no abnormal vasculature, acetowhite lesion(s) noted at 4-7 o'clock, and mosaicism noted at ?circumfrential. Cervical biopsies taken at 7 o'clock and Hemostasis achieved with Monsel's solution. Vagina: vaginal colposcopy not performed Vulva: vulvar colposcopy not performed  Specimens: 1  Complications: none  Chaperone: Winton Cherry  Colposcopic Impression & Plan:   Colposcopy  findings consistent with LSIL/poss HSIL Plan: Post biopsy instructions given, Will notify patient of results when back, and Will base plan of care on pathology results and ASCCP guidelines  Return in about 1 year (around 02/05/2025) for Pap & physical.  Suzen JONELLE Fetters CNM, WHNP-BC 02/06/2024 12:03 PM

## 2024-02-12 ENCOUNTER — Ambulatory Visit: Payer: Self-pay | Admitting: Women's Health

## 2024-02-12 LAB — SURGICAL PATHOLOGY

## 2024-04-03 ENCOUNTER — Encounter: Payer: Self-pay | Admitting: Allergy & Immunology

## 2024-04-03 ENCOUNTER — Other Ambulatory Visit: Payer: Self-pay

## 2024-04-03 ENCOUNTER — Ambulatory Visit: Admitting: Allergy & Immunology

## 2024-04-03 VITALS — BP 114/72 | HR 92 | Temp 98.4°F | Wt 250.8 lb

## 2024-04-03 DIAGNOSIS — L501 Idiopathic urticaria: Secondary | ICD-10-CM | POA: Diagnosis not present

## 2024-04-03 DIAGNOSIS — B999 Unspecified infectious disease: Secondary | ICD-10-CM | POA: Diagnosis not present

## 2024-04-03 DIAGNOSIS — J3089 Other allergic rhinitis: Secondary | ICD-10-CM | POA: Diagnosis not present

## 2024-04-03 DIAGNOSIS — J4520 Mild intermittent asthma, uncomplicated: Secondary | ICD-10-CM

## 2024-04-03 DIAGNOSIS — T7800XD Anaphylactic reaction due to unspecified food, subsequent encounter: Secondary | ICD-10-CM

## 2024-04-03 NOTE — Progress Notes (Unsigned)
 FOLLOW UP  Date of Service/Encounter:  04/03/24   Assessment:   Mild intermittent asthma, uncomplicated   Perennial allergic rhinitis (dust mites, cockroach)   Anaphylactic shock due to food   Chronic urticaria with overlying eczema - changing from Xolair  to Dupixent  today to try to get the eczema under control   Joint pains in hands/wrist - follows with Dr. Lonni Ester   Tachycardia - exacerbated with albuterol  use   Full vaccinated with Moderna  Plan/Recommendations:   Asthma - Lung function looks great today. - Continue with Xopenex  (levalbuterol ) as needed. - Hopefully the Dupixent  is helping a bit.  2. Allergic rhinitis - Continue allergen avoidance measures directed toward dust mite and cockroach - Continue cetirizine  10mg  daily.  - Consider saline nasal rinses as needed for nasal symptoms. Use this before any medicated nasal sprays for best result  3. Hives (urticaria) - Continue with Dupixent  every two weeks.  - Continue taking cetirizine  2 tabs twice a day.  - May use Benadryl  (diphenhydramine ) as needed for breakthrough hives - information on Rhapsido provided (this is an oral tablet that can treat hives).   4. Dry skin in the presence of eczema - Continue with Dupixent  every two weeks.  - Consent signed today.  - I would like to get patch testing to see if we can figure out what chemical is causing your dry skin.   4. Food allergy (seafood, egg) - Continue to avoid fish, shellfish, and egg.   - In case of an allergic reaction, take Benadryl  50 mg every 4 hours, and if life-threatening symptoms occur, inject with EpiPen  0.3 mg.  5. Recurrent infections - Keep track of infections and antibiotics used - If you have any infections or antibiotic needs in future have labs drawn.   6. Return in about 6 months (around 10/02/2024). You can have the follow up appointment with Dr. Iva or a Nurse Practicioner (our Nurse Practitioners are excellent and  always have Physician oversight!).   Subjective:   Dana Mcpherson is a 33 y.o. female presenting today for follow up of  Chief Complaint  Patient presents with   Asthma   Eczema   Follow-up    Dana Mcpherson has a history of the following: Patient Active Problem List   Diagnosis Date Noted   Abnormal Pap smear of cervix 02/06/2024   Atopic dermatitis 09/29/2023   Idiopathic urticaria 08/02/2021   Recurrent infections 08/02/2021   Rash and other nonspecific skin eruption 03/01/2021   Positive ANA (antinuclear antibody) 08/31/2020   Mild intermittent asthma without complication 06/12/2020   Perennial allergic rhinitis 06/12/2020   Anaphylactic shock due to adverse food reaction 06/12/2020   Chronic urticaria 06/12/2020   Early satiety 03/11/2020   Abdominal pain 05/24/2019   Abdominal bloating 02/20/2019   Elevated LFTs 02/20/2019   Gastroesophageal reflux disease 02/20/2019   Dysphagia 02/20/2019   Diarrhea 02/20/2019   Hemorrhoid 06/29/2015   Hidradenitis suppurativa 06/23/2015   Shortness of breath 11/04/2014   Inappropriate sinus tachycardia 10/07/2014   PCOS (polycystic ovarian syndrome) 03/03/2014   Hyperinsulinemia 03/03/2014   Morbid obesity (HCC) 05/23/2013   DUB (dysfunctional uterine bleeding) 05/23/2013    History obtained from: chart review and patient.  Discussed the use of AI scribe software for clinical note transcription with the patient and/or guardian, who gave verbal consent to proceed.  Dana Mcpherson is a 33 y.o. female presenting for a follow up visit.  She was last seen in June 2025.  At  that time, and continued on Xopenex  as needed.  Her allergic rhinitis, she is doing well on Zyrtec  and nasal sprays.  Hives are under good control with present regimen.  Atopic dermatitis currently.  We have talked about patch testing in the past.  For her infections, she is doing well without breakthrough antibiotics.  She continue to avoid egg, shellfish, and  egg.  Since last visit, she has done relatively well.   Asthma/Respiratory Symptom History: She experiences breathing difficulties, which she describes as her 'normal'. Despite Xopenex  being approved, she does not use it due to its high cost. She is currently on Dupixent . She is able to exercise regularly, including using a treadmill and weights, without significant breathing issues. All of her controller medications caused her ot have elevated heart rate, which is why she decided to forgo controller medications entirely. She has not needed prednisone  at all for her symptoms.   She discusses her son's history of asthma, noting that he had severe asthma as a baby and required anabolic steroids when he got sick. He is currently on an inhaler, which is covered by insurance after some initial issues.   Skin Symptom History: She experiences hives, particularly when stressed, with the hives appearing on her face during such times. She has been on Xolair  and Dupixent , which have helped manage her symptoms. Hydroxyzine has also been prescribed and is helpful in managing her symptoms.  Infection Symptom History: She mentions that her entire family got sick recently, but she did not. She has actually remained fairly healthy and off of antibiotics for months at this point. Her son whom I also see does continue to get sick but at least he is not requiring any antibiotic courses.   Otherwise, there have been no changes to her past medical history, surgical history, family history, or social history.    Review of systems otherwise negative other than that mentioned in the HPI.    Objective:   Blood pressure 114/72, pulse 92, temperature 98.4 F (36.9 C), temperature source Temporal, weight 250 lb 12.8 oz (113.8 kg), SpO2 96%. Body mass index is 40.24 kg/m.    Physical Exam Vitals reviewed.  Constitutional:      Appearance: She is well-developed.     Comments: Talkative.  Well-appearing.  HENT:      Head: Normocephalic and atraumatic.     Right Ear: Tympanic membrane, ear canal and external ear normal.     Left Ear: Tympanic membrane, ear canal and external ear normal.     Nose: Mucosal edema and rhinorrhea present. No nasal deformity or septal deviation.     Right Turbinates: Enlarged, swollen and pale.     Left Turbinates: Enlarged, swollen and pale.     Right Sinus: No maxillary sinus tenderness or frontal sinus tenderness.     Left Sinus: No maxillary sinus tenderness or frontal sinus tenderness.     Comments: No polyps noted.     Mouth/Throat:     Lips: Pink.     Mouth: Mucous membranes are moist. Mucous membranes are not pale and not dry.     Pharynx: Uvula midline.     Comments: Cobblestoning in the posterior oropharynx.  Eyes:     General: Lids are normal. Allergic shiner present.        Right eye: No discharge.        Left eye: No discharge.     Conjunctiva/sclera: Conjunctivae normal.     Right eye: Right conjunctiva is not injected.  No chemosis.    Left eye: Left conjunctiva is not injected. No chemosis.    Pupils: Pupils are equal, round, and reactive to light.  Cardiovascular:     Rate and Rhythm: Normal rate and regular rhythm.     Heart sounds: Normal heart sounds.  Pulmonary:     Effort: Pulmonary effort is normal. No tachypnea, accessory muscle usage or respiratory distress.     Breath sounds: Normal breath sounds. No wheezing, rhonchi or rales.  Chest:     Chest wall: No tenderness.  Lymphadenopathy:     Cervical: No cervical adenopathy.  Skin:    General: Skin is warm.     Capillary Refill: Capillary refill takes less than 2 seconds.     Coloration: Skin is not pale.     Findings: No abrasion, erythema, petechiae or rash. Rash is not papular, urticarial or vesicular.     Comments: She has erythematous pustules over her upper chest. She has some similar appearing lesions on her arms as well as some eczematous lesions on her wrists.   Neurological:      Mental Status: She is alert.  Psychiatric:        Behavior: Behavior is cooperative.      Diagnostic studies:    Spirometry: results normal (FEV1: 2.57/75%, FVC: 3.12/75%, FEV1/FVC: 82%).    Spirometry consistent with normal pattern.    Allergy Studies: none       Marty Shaggy, MD  [facility] 

## 2024-04-03 NOTE — Patient Instructions (Addendum)
 Asthma - Lung function looks great today. - Continue with Xopenex  (levalbuterol ) as needed. - Hopefully the Dupixent  is helping a bit.  2. Allergic rhinitis - Continue allergen avoidance measures directed toward dust mite and cockroach - Continue cetirizine  10mg  daily.  - Consider saline nasal rinses as needed for nasal symptoms. Use this before any medicated nasal sprays for best result  3. Hives (urticaria) - Continue with Dupixent  every two weeks.  - Continue taking cetirizine  2 tabs twice a day.  - May use Benadryl  (diphenhydramine ) as needed for breakthrough hives - information on Rhapsido provided (this is an oral tablet that can treat hives).   4. Dry skin in the presence of eczema - Continue with Dupixent  every two weeks.  - Consent signed today.  - I would like to get patch testing to see if we can figure out what chemical is causing your dry skin.   4. Food allergy (seafood, egg) - Continue to avoid fish, shellfish, and egg.   - In case of an allergic reaction, take Benadryl  50 mg every 4 hours, and if life-threatening symptoms occur, inject with EpiPen  0.3 mg.  5. Recurrent infections - Keep track of infections and antibiotics used - If you have any infections or antibiotic needs in future have labs drawn.   6. Return in about 6 months (around 10/02/2024). You can have the follow up appointment with Dr. Iva or a Nurse Practicioner (our Nurse Practitioners are excellent and always have Physician oversight!).    Please inform us  of any Emergency Department visits, hospitalizations, or changes in symptoms. Call us  before going to the ED for breathing or allergy symptoms since we might be able to fit you in for a sick visit. Feel free to contact us  anytime with any questions, problems, or concerns.  It was a pleasure to see you again today!  Websites that have reliable patient information: 1. American Academy of Asthma, Allergy, and Immunology: www.aaaai.org 2. Food  Allergy Research and Education (FARE): foodallergy.org 3. Mothers of Asthmatics: http://www.asthmacommunitynetwork.org 4. American College of Allergy, Asthma, and Immunology: www.acaai.org      Like us  on [employer] and Instagram for our latest updates!      A healthy democracy works best when [employer] participate! Make sure you are registered to vote! If you have moved or changed any of your contact information, you will need to get this updated before voting! Scan the QR codes below to learn more!

## 2024-04-05 ENCOUNTER — Encounter: Payer: Self-pay | Admitting: Allergy & Immunology

## 2024-10-04 ENCOUNTER — Ambulatory Visit: Admitting: Allergy & Immunology
